# Patient Record
Sex: Female | Born: 1940
Health system: Southern US, Community
[De-identification: ages and names within clinical notes are randomized; demographics above are authoritative.]

## PROBLEM LIST (undated history)

## (undated) DIAGNOSIS — F0390 Unspecified dementia without behavioral disturbance: Secondary | ICD-10-CM

## (undated) DIAGNOSIS — E039 Hypothyroidism, unspecified: Secondary | ICD-10-CM

## (undated) DIAGNOSIS — N183 Chronic kidney disease, stage 3 unspecified: Secondary | ICD-10-CM

## (undated) DIAGNOSIS — M419 Scoliosis, unspecified: Secondary | ICD-10-CM

## (undated) DIAGNOSIS — R931 Abnormal findings on diagnostic imaging of heart and coronary circulation: Secondary | ICD-10-CM

## (undated) DIAGNOSIS — K219 Gastro-esophageal reflux disease without esophagitis: Secondary | ICD-10-CM

## (undated) DIAGNOSIS — F419 Anxiety disorder, unspecified: Secondary | ICD-10-CM

## (undated) DIAGNOSIS — M1990 Unspecified osteoarthritis, unspecified site: Secondary | ICD-10-CM

## (undated) DIAGNOSIS — M549 Dorsalgia, unspecified: Secondary | ICD-10-CM

## (undated) DIAGNOSIS — F329 Major depressive disorder, single episode, unspecified: Secondary | ICD-10-CM

## (undated) DIAGNOSIS — G4730 Sleep apnea, unspecified: Secondary | ICD-10-CM

## (undated) DIAGNOSIS — R112 Nausea with vomiting, unspecified: Secondary | ICD-10-CM

## (undated) DIAGNOSIS — R413 Other amnesia: Secondary | ICD-10-CM

## (undated) DIAGNOSIS — F32A Depression, unspecified: Secondary | ICD-10-CM

## (undated) DIAGNOSIS — I4891 Unspecified atrial fibrillation: Secondary | ICD-10-CM

## (undated) DIAGNOSIS — I499 Cardiac arrhythmia, unspecified: Secondary | ICD-10-CM

## (undated) DIAGNOSIS — M810 Age-related osteoporosis without current pathological fracture: Secondary | ICD-10-CM

## (undated) DIAGNOSIS — R269 Unspecified abnormalities of gait and mobility: Principal | ICD-10-CM

## (undated) DIAGNOSIS — J45909 Unspecified asthma, uncomplicated: Secondary | ICD-10-CM

## (undated) DIAGNOSIS — Z8781 Personal history of (healed) traumatic fracture: Secondary | ICD-10-CM

## (undated) DIAGNOSIS — I10 Essential (primary) hypertension: Secondary | ICD-10-CM

## (undated) HISTORY — DX: Depression, unspecified: F32.A

## (undated) HISTORY — DX: Other amnesia: R41.3

## (undated) HISTORY — DX: Cardiac arrhythmia, unspecified: I49.9

## (undated) HISTORY — DX: Abnormal findings on diagnostic imaging of heart and coronary circulation: R93.1

## (undated) HISTORY — DX: Age-related osteoporosis without current pathological fracture: M81.0

## (undated) HISTORY — PX: JOINT REPLACEMENT: SHX530

## (undated) HISTORY — PX: CATARACT EXTRACTION, BILATERAL: SHX1313

## (undated) HISTORY — DX: Essential (primary) hypertension: I10

## (undated) HISTORY — DX: Major depressive disorder, single episode, unspecified: F32.9

## (undated) HISTORY — DX: Scoliosis, unspecified: M41.9

## (undated) HISTORY — DX: Dorsalgia, unspecified: M54.9

## (undated) HISTORY — PX: DILATION AND CURETTAGE OF UTERUS: SHX78

## (undated) HISTORY — PX: TUBAL LIGATION: SHX77

## (undated) HISTORY — DX: Personal history of (healed) traumatic fracture: Z87.81

## (undated) HISTORY — DX: Unspecified abnormalities of gait and mobility: R26.9

## (undated) HISTORY — DX: Unspecified atrial fibrillation: I48.91

## (undated) HISTORY — DX: Unspecified asthma, uncomplicated: J45.909

---

## 1978-11-18 HISTORY — PX: VAGINAL HYSTERECTOMY: SUR661

## 1998-09-04 ENCOUNTER — Encounter: Admission: RE | Admit: 1998-09-04 | Discharge: 1998-12-03 | Payer: Self-pay | Admitting: Rheumatology

## 1998-09-19 ENCOUNTER — Other Ambulatory Visit: Admission: RE | Admit: 1998-09-19 | Discharge: 1998-09-19 | Payer: Self-pay | Admitting: Family Medicine

## 1998-10-13 ENCOUNTER — Ambulatory Visit (HOSPITAL_COMMUNITY): Admission: RE | Admit: 1998-10-13 | Discharge: 1998-10-13 | Payer: Self-pay | Admitting: Family Medicine

## 1998-10-13 ENCOUNTER — Encounter: Payer: Self-pay | Admitting: Family Medicine

## 1998-11-22 ENCOUNTER — Ambulatory Visit (HOSPITAL_COMMUNITY): Admission: RE | Admit: 1998-11-22 | Discharge: 1998-11-22 | Payer: Self-pay | Admitting: Rheumatology

## 1998-11-22 ENCOUNTER — Encounter: Payer: Self-pay | Admitting: Rheumatology

## 1998-11-27 ENCOUNTER — Ambulatory Visit (HOSPITAL_COMMUNITY): Admission: RE | Admit: 1998-11-27 | Discharge: 1998-11-27 | Payer: Self-pay | Admitting: Family Medicine

## 1999-11-06 ENCOUNTER — Ambulatory Visit (HOSPITAL_COMMUNITY): Admission: RE | Admit: 1999-11-06 | Discharge: 1999-11-06 | Payer: Self-pay | Admitting: Family Medicine

## 1999-11-06 ENCOUNTER — Encounter: Payer: Self-pay | Admitting: Family Medicine

## 2000-06-04 ENCOUNTER — Encounter: Payer: Self-pay | Admitting: Rheumatology

## 2000-06-04 ENCOUNTER — Ambulatory Visit (HOSPITAL_COMMUNITY): Admission: RE | Admit: 2000-06-04 | Discharge: 2000-06-04 | Payer: Self-pay | Admitting: Rheumatology

## 2000-08-13 ENCOUNTER — Other Ambulatory Visit: Admission: RE | Admit: 2000-08-13 | Discharge: 2000-08-13 | Payer: Self-pay | Admitting: Family Medicine

## 2000-11-21 ENCOUNTER — Emergency Department (HOSPITAL_COMMUNITY): Admission: EM | Admit: 2000-11-21 | Discharge: 2000-11-21 | Payer: Self-pay | Admitting: Emergency Medicine

## 2000-11-21 ENCOUNTER — Encounter: Payer: Self-pay | Admitting: Emergency Medicine

## 2001-11-14 ENCOUNTER — Encounter: Payer: Self-pay | Admitting: Emergency Medicine

## 2001-11-14 ENCOUNTER — Emergency Department (HOSPITAL_COMMUNITY): Admission: EM | Admit: 2001-11-14 | Discharge: 2001-11-14 | Payer: Self-pay | Admitting: *Deleted

## 2002-01-20 ENCOUNTER — Ambulatory Visit (HOSPITAL_COMMUNITY): Admission: RE | Admit: 2002-01-20 | Discharge: 2002-01-21 | Payer: Self-pay | Admitting: Nephrology

## 2002-01-20 ENCOUNTER — Encounter (INDEPENDENT_AMBULATORY_CARE_PROVIDER_SITE_OTHER): Payer: Self-pay | Admitting: *Deleted

## 2002-01-20 ENCOUNTER — Encounter: Payer: Self-pay | Admitting: Nephrology

## 2002-02-03 ENCOUNTER — Encounter (INDEPENDENT_AMBULATORY_CARE_PROVIDER_SITE_OTHER): Payer: Self-pay | Admitting: Specialist

## 2002-02-03 ENCOUNTER — Encounter: Payer: Self-pay | Admitting: Nephrology

## 2002-02-03 ENCOUNTER — Ambulatory Visit (HOSPITAL_COMMUNITY): Admission: RE | Admit: 2002-02-03 | Discharge: 2002-02-04 | Payer: Self-pay | Admitting: Nephrology

## 2004-05-23 ENCOUNTER — Emergency Department (HOSPITAL_COMMUNITY): Admission: EM | Admit: 2004-05-23 | Discharge: 2004-05-23 | Payer: Self-pay | Admitting: Emergency Medicine

## 2004-09-18 ENCOUNTER — Encounter (HOSPITAL_COMMUNITY): Admission: RE | Admit: 2004-09-18 | Discharge: 2004-09-27 | Payer: Self-pay | Admitting: Nephrology

## 2004-10-02 ENCOUNTER — Encounter (HOSPITAL_COMMUNITY): Admission: RE | Admit: 2004-10-02 | Discharge: 2004-10-15 | Payer: Self-pay | Admitting: Nephrology

## 2004-10-09 ENCOUNTER — Emergency Department (HOSPITAL_COMMUNITY): Admission: EM | Admit: 2004-10-09 | Discharge: 2004-10-09 | Payer: Self-pay | Admitting: Emergency Medicine

## 2005-09-18 ENCOUNTER — Ambulatory Visit (HOSPITAL_BASED_OUTPATIENT_CLINIC_OR_DEPARTMENT_OTHER): Admission: RE | Admit: 2005-09-18 | Discharge: 2005-09-18 | Payer: Self-pay | Admitting: Family Medicine

## 2005-09-22 ENCOUNTER — Ambulatory Visit: Payer: Self-pay | Admitting: Internal Medicine

## 2005-10-29 ENCOUNTER — Ambulatory Visit (HOSPITAL_BASED_OUTPATIENT_CLINIC_OR_DEPARTMENT_OTHER): Admission: RE | Admit: 2005-10-29 | Discharge: 2005-10-29 | Payer: Self-pay | Admitting: Family Medicine

## 2005-11-03 ENCOUNTER — Ambulatory Visit: Payer: Self-pay | Admitting: Internal Medicine

## 2006-07-02 ENCOUNTER — Encounter: Payer: Self-pay | Admitting: Emergency Medicine

## 2006-07-03 ENCOUNTER — Inpatient Hospital Stay (HOSPITAL_COMMUNITY): Admission: EM | Admit: 2006-07-03 | Discharge: 2006-07-06 | Payer: Self-pay | Admitting: Family Medicine

## 2006-08-25 ENCOUNTER — Encounter (INDEPENDENT_AMBULATORY_CARE_PROVIDER_SITE_OTHER): Payer: Self-pay | Admitting: Specialist

## 2006-08-25 ENCOUNTER — Ambulatory Visit (HOSPITAL_COMMUNITY): Admission: RE | Admit: 2006-08-25 | Discharge: 2006-08-25 | Payer: Self-pay | Admitting: Gastroenterology

## 2006-08-28 ENCOUNTER — Ambulatory Visit (HOSPITAL_COMMUNITY): Admission: RE | Admit: 2006-08-28 | Discharge: 2006-08-28 | Payer: Self-pay | Admitting: Gastroenterology

## 2006-09-02 ENCOUNTER — Ambulatory Visit: Payer: Self-pay | Admitting: Physical Medicine & Rehabilitation

## 2006-09-02 ENCOUNTER — Encounter
Admission: RE | Admit: 2006-09-02 | Discharge: 2006-12-01 | Payer: Self-pay | Admitting: Physical Medicine & Rehabilitation

## 2006-09-16 ENCOUNTER — Encounter
Admission: RE | Admit: 2006-09-16 | Discharge: 2006-09-25 | Payer: Self-pay | Admitting: Physical Medicine & Rehabilitation

## 2006-09-30 ENCOUNTER — Ambulatory Visit: Payer: Self-pay | Admitting: Physical Medicine & Rehabilitation

## 2006-10-07 ENCOUNTER — Emergency Department (HOSPITAL_COMMUNITY): Admission: EM | Admit: 2006-10-07 | Discharge: 2006-10-07 | Payer: Self-pay | Admitting: Emergency Medicine

## 2006-12-21 ENCOUNTER — Emergency Department (HOSPITAL_COMMUNITY): Admission: EM | Admit: 2006-12-21 | Discharge: 2006-12-22 | Payer: Self-pay | Admitting: Emergency Medicine

## 2006-12-23 ENCOUNTER — Ambulatory Visit: Payer: Self-pay | Admitting: Physical Medicine & Rehabilitation

## 2006-12-23 ENCOUNTER — Encounter
Admission: RE | Admit: 2006-12-23 | Discharge: 2007-03-23 | Payer: Self-pay | Admitting: Physical Medicine & Rehabilitation

## 2007-03-20 ENCOUNTER — Ambulatory Visit: Payer: Self-pay | Admitting: Physical Medicine & Rehabilitation

## 2007-03-20 ENCOUNTER — Encounter
Admission: RE | Admit: 2007-03-20 | Discharge: 2007-06-18 | Payer: Self-pay | Admitting: Physical Medicine & Rehabilitation

## 2007-03-23 ENCOUNTER — Ambulatory Visit (HOSPITAL_COMMUNITY)
Admission: RE | Admit: 2007-03-23 | Discharge: 2007-03-23 | Payer: Self-pay | Admitting: Physical Medicine & Rehabilitation

## 2007-03-26 ENCOUNTER — Encounter
Admission: RE | Admit: 2007-03-26 | Discharge: 2007-06-24 | Payer: Self-pay | Admitting: Physical Medicine & Rehabilitation

## 2007-03-30 ENCOUNTER — Ambulatory Visit: Payer: Self-pay | Admitting: Physical Medicine & Rehabilitation

## 2007-05-15 ENCOUNTER — Ambulatory Visit: Payer: Self-pay | Admitting: Physical Medicine & Rehabilitation

## 2007-07-19 ENCOUNTER — Emergency Department (HOSPITAL_COMMUNITY): Admission: EM | Admit: 2007-07-19 | Discharge: 2007-07-19 | Payer: Self-pay | Admitting: Emergency Medicine

## 2007-07-24 ENCOUNTER — Inpatient Hospital Stay (HOSPITAL_COMMUNITY): Admission: EM | Admit: 2007-07-24 | Discharge: 2007-07-30 | Payer: Self-pay | Admitting: Emergency Medicine

## 2007-07-30 ENCOUNTER — Encounter (INDEPENDENT_AMBULATORY_CARE_PROVIDER_SITE_OTHER): Payer: Self-pay | Admitting: Gastroenterology

## 2007-09-02 ENCOUNTER — Ambulatory Visit: Payer: Self-pay | Admitting: Physical Medicine & Rehabilitation

## 2007-09-02 ENCOUNTER — Encounter
Admission: RE | Admit: 2007-09-02 | Discharge: 2007-09-30 | Payer: Self-pay | Admitting: Physical Medicine & Rehabilitation

## 2007-12-22 ENCOUNTER — Encounter
Admission: RE | Admit: 2007-12-22 | Discharge: 2008-03-21 | Payer: Self-pay | Admitting: Physical Medicine & Rehabilitation

## 2008-01-11 ENCOUNTER — Ambulatory Visit: Payer: Self-pay | Admitting: Physical Medicine & Rehabilitation

## 2008-01-11 ENCOUNTER — Ambulatory Visit (HOSPITAL_COMMUNITY)
Admission: RE | Admit: 2008-01-11 | Discharge: 2008-01-11 | Payer: Self-pay | Admitting: Physical Medicine & Rehabilitation

## 2008-02-05 ENCOUNTER — Encounter
Admission: RE | Admit: 2008-02-05 | Discharge: 2008-02-05 | Payer: Self-pay | Admitting: Physical Medicine & Rehabilitation

## 2008-06-22 ENCOUNTER — Encounter
Admission: RE | Admit: 2008-06-22 | Discharge: 2008-06-24 | Payer: Self-pay | Admitting: Physical Medicine & Rehabilitation

## 2008-06-24 ENCOUNTER — Ambulatory Visit: Payer: Self-pay | Admitting: Physical Medicine & Rehabilitation

## 2008-07-21 ENCOUNTER — Emergency Department (HOSPITAL_COMMUNITY): Admission: EM | Admit: 2008-07-21 | Discharge: 2008-07-21 | Payer: Self-pay | Admitting: Emergency Medicine

## 2009-12-19 HISTORY — PX: OPEN REDUCTION INTERNAL FIXATION (ORIF) DISTAL RADIAL FRACTURE: SHX5989

## 2009-12-21 ENCOUNTER — Emergency Department (HOSPITAL_COMMUNITY): Admission: EM | Admit: 2009-12-21 | Discharge: 2009-12-21 | Payer: Self-pay | Admitting: Emergency Medicine

## 2009-12-28 ENCOUNTER — Ambulatory Visit (HOSPITAL_BASED_OUTPATIENT_CLINIC_OR_DEPARTMENT_OTHER): Admission: RE | Admit: 2009-12-28 | Discharge: 2009-12-28 | Payer: Self-pay | Admitting: Orthopedic Surgery

## 2010-02-11 ENCOUNTER — Inpatient Hospital Stay (HOSPITAL_COMMUNITY): Admission: EM | Admit: 2010-02-11 | Discharge: 2010-02-19 | Payer: Self-pay | Admitting: Emergency Medicine

## 2010-02-15 ENCOUNTER — Ambulatory Visit: Payer: Self-pay | Admitting: Vascular Surgery

## 2010-02-15 ENCOUNTER — Encounter (INDEPENDENT_AMBULATORY_CARE_PROVIDER_SITE_OTHER): Payer: Self-pay | Admitting: Surgery

## 2010-03-18 HISTORY — PX: TOTAL KNEE ARTHROPLASTY: SHX125

## 2010-04-06 ENCOUNTER — Inpatient Hospital Stay (HOSPITAL_COMMUNITY): Admission: RE | Admit: 2010-04-06 | Discharge: 2010-04-13 | Payer: Self-pay | Admitting: Orthopedic Surgery

## 2010-04-09 ENCOUNTER — Encounter (INDEPENDENT_AMBULATORY_CARE_PROVIDER_SITE_OTHER): Payer: Self-pay | Admitting: Orthopedic Surgery

## 2010-04-09 ENCOUNTER — Ambulatory Visit: Payer: Self-pay | Admitting: Vascular Surgery

## 2010-09-20 ENCOUNTER — Emergency Department (HOSPITAL_COMMUNITY)
Admission: EM | Admit: 2010-09-20 | Discharge: 2010-09-20 | Payer: Self-pay | Source: Home / Self Care | Admitting: Emergency Medicine

## 2010-12-01 ENCOUNTER — Emergency Department (HOSPITAL_COMMUNITY)
Admission: EM | Admit: 2010-12-01 | Discharge: 2010-12-02 | Disposition: A | Discharge status: Other Acute Inpatient Hospital | Payer: Self-pay | Source: Home / Self Care | Admitting: Emergency Medicine

## 2010-12-03 ENCOUNTER — Emergency Department (HOSPITAL_COMMUNITY)
Admission: EM | Admit: 2010-12-03 | Discharge: 2010-12-03 | Payer: Self-pay | Source: Home / Self Care | Admitting: Emergency Medicine

## 2010-12-03 LAB — CBC
HCT: 41.4 % (ref 36.0–46.0)
Hemoglobin: 14.1 g/dL (ref 12.0–15.0)
MCH: 31.8 pg (ref 26.0–34.0)
MCHC: 34.1 g/dL (ref 30.0–36.0)
MCV: 93.5 fL (ref 78.0–100.0)
Platelets: 198 10*3/uL (ref 150–400)
RBC: 4.43 MIL/uL (ref 3.87–5.11)
RDW: 13 % (ref 11.5–15.5)
WBC: 7.5 10*3/uL (ref 4.0–10.5)

## 2010-12-03 LAB — RAPID URINE DRUG SCREEN, HOSP PERFORMED
Amphetamines: NOT DETECTED
Barbiturates: NOT DETECTED
Benzodiazepines: NOT DETECTED
Cocaine: NOT DETECTED
Opiates: NOT DETECTED
Tetrahydrocannabinol: NOT DETECTED

## 2010-12-03 LAB — COMPREHENSIVE METABOLIC PANEL
ALT: 13 U/L (ref 0–35)
AST: 19 U/L (ref 0–37)
Albumin: 4 g/dL (ref 3.5–5.2)
Alkaline Phosphatase: 91 U/L (ref 39–117)
BUN: 22 mg/dL (ref 6–23)
CO2: 20 mEq/L (ref 19–32)
Calcium: 10 mg/dL (ref 8.4–10.5)
Chloride: 110 mEq/L (ref 96–112)
Creatinine, Ser: 2.07 mg/dL — ABNORMAL HIGH (ref 0.4–1.2)
GFR calc Af Amer: 29 mL/min — ABNORMAL LOW (ref >60)
GFR calc non Af Amer: 24 mL/min — ABNORMAL LOW (ref >60)
Glucose, Bld: 132 mg/dL — ABNORMAL HIGH (ref 70–99)
Potassium: 4.2 mEq/L (ref 3.5–5.1)
Sodium: 140 mEq/L (ref 135–145)
Total Bilirubin: 0.6 mg/dL (ref 0.3–1.2)
Total Protein: 7.7 g/dL (ref 6.0–8.3)

## 2010-12-03 LAB — URINALYSIS, ROUTINE W REFLEX MICROSCOPIC
Bilirubin Urine: NEGATIVE
Hgb urine dipstick: NEGATIVE
Ketones, ur: NEGATIVE mg/dL
Nitrite: NEGATIVE
Protein, ur: NEGATIVE mg/dL
Specific Gravity, Urine: 1.02 (ref 1.005–1.030)
Urine Glucose, Fasting: NEGATIVE mg/dL
Urobilinogen, UA: 0.2 mg/dL (ref 0.0–1.0)
pH: 5 (ref 5.0–8.0)

## 2010-12-03 LAB — DIFFERENTIAL
Basophils Absolute: 0 10*3/uL (ref 0.0–0.1)
Basophils Relative: 0 % (ref 0–1)
Eosinophils Absolute: 0 10*3/uL (ref 0.0–0.7)
Eosinophils Relative: 0 % (ref 0–5)
Lymphocytes Relative: 18 % (ref 12–46)
Lymphs Abs: 1.3 10*3/uL (ref 0.7–4.0)
Monocytes Absolute: 0.5 10*3/uL (ref 0.1–1.0)
Monocytes Relative: 6 % (ref 3–12)
Neutro Abs: 5.7 10*3/uL (ref 1.7–7.7)
Neutrophils Relative %: 75 % (ref 43–77)

## 2010-12-03 LAB — ETHANOL: Alcohol, Ethyl (B): <5 mg/dL (ref 0–10)

## 2010-12-08 ENCOUNTER — Other Ambulatory Visit: Payer: Self-pay | Admitting: Otolaryngology

## 2010-12-08 DIAGNOSIS — R42 Dizziness and giddiness: Secondary | ICD-10-CM

## 2010-12-19 ENCOUNTER — Ambulatory Visit
Admission: RE | Admit: 2010-12-19 | Discharge: 2010-12-19 | Disposition: A | Discharge status: Home / Self Care | Payer: MEDICARE | Source: Ambulatory Visit | Attending: Otolaryngology | Admitting: Otolaryngology

## 2010-12-19 ENCOUNTER — Other Ambulatory Visit: Payer: Self-pay | Admitting: Otolaryngology

## 2010-12-19 DIAGNOSIS — R42 Dizziness and giddiness: Secondary | ICD-10-CM

## 2011-02-04 LAB — RENAL FUNCTION PANEL
Albumin: 3.3 g/dL — ABNORMAL LOW (ref 3.5–5.2)
Albumin: 3.3 g/dL — ABNORMAL LOW (ref 3.5–5.2)
Albumin: 3.4 g/dL — ABNORMAL LOW (ref 3.5–5.2)
BUN: 21 mg/dL (ref 6–23)
BUN: 25 mg/dL — ABNORMAL HIGH (ref 6–23)
BUN: 29 mg/dL — ABNORMAL HIGH (ref 6–23)
CO2: 20 mEq/L (ref 19–32)
CO2: 23 mEq/L (ref 19–32)
Calcium: 8.9 mg/dL (ref 8.4–10.5)
Calcium: 9 mg/dL (ref 8.4–10.5)
Chloride: 108 mEq/L (ref 96–112)
Chloride: 115 mEq/L — ABNORMAL HIGH (ref 96–112)
Creatinine, Ser: 1.57 mg/dL — ABNORMAL HIGH (ref 0.4–1.2)
Creatinine, Ser: 1.97 mg/dL — ABNORMAL HIGH (ref 0.4–1.2)
Creatinine, Ser: 2.09 mg/dL — ABNORMAL HIGH (ref 0.4–1.2)
Creatinine, Ser: 2.12 mg/dL — ABNORMAL HIGH (ref 0.4–1.2)
GFR calc Af Amer: 28 mL/min — ABNORMAL LOW (ref >60)
GFR calc Af Amer: 29 mL/min — ABNORMAL LOW (ref >60)
GFR calc non Af Amer: 23 mL/min — ABNORMAL LOW (ref >60)
GFR calc non Af Amer: 24 mL/min — ABNORMAL LOW (ref >60)
GFR calc non Af Amer: 33 mL/min — ABNORMAL LOW (ref >60)
Glucose, Bld: 122 mg/dL — ABNORMAL HIGH (ref 70–99)
Phosphorus: 3.4 mg/dL (ref 2.3–4.6)
Phosphorus: 4 mg/dL (ref 2.3–4.6)
Sodium: 140 mEq/L (ref 135–145)
Sodium: 141 mEq/L (ref 135–145)

## 2011-02-04 LAB — BASIC METABOLIC PANEL
BUN: 15 mg/dL (ref 6–23)
BUN: 15 mg/dL (ref 6–23)
BUN: 15 mg/dL (ref 6–23)
BUN: 19 mg/dL (ref 6–23)
CO2: 27 mEq/L (ref 19–32)
Calcium: 8.1 mg/dL — ABNORMAL LOW (ref 8.4–10.5)
Calcium: 8.3 mg/dL — ABNORMAL LOW (ref 8.4–10.5)
Calcium: 8.4 mg/dL (ref 8.4–10.5)
Calcium: 9 mg/dL (ref 8.4–10.5)
Chloride: 104 mEq/L (ref 96–112)
Creatinine, Ser: 1.06 mg/dL (ref 0.4–1.2)
Creatinine, Ser: 1.18 mg/dL (ref 0.4–1.2)
Creatinine, Ser: 1.21 mg/dL — ABNORMAL HIGH (ref 0.4–1.2)
Creatinine, Ser: 1.21 mg/dL — ABNORMAL HIGH (ref 0.4–1.2)
Creatinine, Ser: 1.25 mg/dL — ABNORMAL HIGH (ref 0.4–1.2)
GFR calc Af Amer: 38 mL/min — ABNORMAL LOW (ref >60)
GFR calc Af Amer: 55 mL/min — ABNORMAL LOW (ref >60)
GFR calc Af Amer: >60 mL/min (ref >60)
GFR calc non Af Amer: 32 mL/min — ABNORMAL LOW (ref >60)
GFR calc non Af Amer: 43 mL/min — ABNORMAL LOW (ref >60)
GFR calc non Af Amer: 45 mL/min — ABNORMAL LOW (ref >60)
GFR calc non Af Amer: 45 mL/min — ABNORMAL LOW (ref >60)
GFR calc non Af Amer: 48 mL/min — ABNORMAL LOW (ref >60)
GFR calc non Af Amer: 52 mL/min — ABNORMAL LOW (ref >60)
Glucose, Bld: 107 mg/dL — ABNORMAL HIGH (ref 70–99)
Glucose, Bld: 123 mg/dL — ABNORMAL HIGH (ref 70–99)
Potassium: 3.2 mEq/L — ABNORMAL LOW (ref 3.5–5.1)
Potassium: 3.3 mEq/L — ABNORMAL LOW (ref 3.5–5.1)
Potassium: 3.4 mEq/L — ABNORMAL LOW (ref 3.5–5.1)
Sodium: 139 mEq/L (ref 135–145)
Sodium: 141 mEq/L (ref 135–145)
Sodium: 143 mEq/L (ref 135–145)

## 2011-02-04 LAB — CBC
HCT: 24 % — ABNORMAL LOW (ref 36.0–46.0)
Hemoglobin: 10.9 g/dL — ABNORMAL LOW (ref 12.0–15.0)
Hemoglobin: 8.3 g/dL — ABNORMAL LOW (ref 12.0–15.0)
Hemoglobin: 9.1 g/dL — ABNORMAL LOW (ref 12.0–15.0)
Hemoglobin: 9.9 g/dL — ABNORMAL LOW (ref 12.0–15.0)
MCHC: 34 g/dL (ref 30.0–36.0)
MCHC: 34.3 g/dL (ref 30.0–36.0)
MCHC: 34.5 g/dL (ref 30.0–36.0)
MCHC: 34.7 g/dL (ref 30.0–36.0)
MCHC: 34.8 g/dL (ref 30.0–36.0)
MCV: 91.1 fL (ref 78.0–100.0)
MCV: 91.2 fL (ref 78.0–100.0)
MCV: 91.2 fL (ref 78.0–100.0)
MCV: 92 fL (ref 78.0–100.0)
MCV: 92 fL (ref 78.0–100.0)
Platelets: 139 10*3/uL — ABNORMAL LOW (ref 150–400)
Platelets: 156 10*3/uL (ref 150–400)
Platelets: 179 10*3/uL (ref 150–400)
Platelets: 195 10*3/uL (ref 150–400)
Platelets: 219 10*3/uL (ref 150–400)
RBC: 2.61 MIL/uL — ABNORMAL LOW (ref 3.87–5.11)
RBC: 3.09 MIL/uL — ABNORMAL LOW (ref 3.87–5.11)
RBC: 3.17 MIL/uL — ABNORMAL LOW (ref 3.87–5.11)
RBC: 3.32 MIL/uL — ABNORMAL LOW (ref 3.87–5.11)
RBC: 3.44 MIL/uL — ABNORMAL LOW (ref 3.87–5.11)
RBC: 3.55 MIL/uL — ABNORMAL LOW (ref 3.87–5.11)
RBC: 4.15 MIL/uL (ref 3.87–5.11)
RDW: 13.4 % (ref 11.5–15.5)
RDW: 13.7 % (ref 11.5–15.5)
RDW: 13.8 % (ref 11.5–15.5)
RDW: 14.1 % (ref 11.5–15.5)
WBC: 11.4 10*3/uL — ABNORMAL HIGH (ref 4.0–10.5)
WBC: 13.5 10*3/uL — ABNORMAL HIGH (ref 4.0–10.5)
WBC: 8.1 10*3/uL (ref 4.0–10.5)
WBC: 8.5 10*3/uL (ref 4.0–10.5)

## 2011-02-04 LAB — URINE CULTURE: Culture: NO GROWTH

## 2011-02-04 LAB — TYPE AND SCREEN
ABO/RH(D): O NEG
Antibody Screen: NEGATIVE

## 2011-02-04 LAB — URINALYSIS, ROUTINE W REFLEX MICROSCOPIC
Glucose, UA: NEGATIVE mg/dL
Hgb urine dipstick: NEGATIVE
Specific Gravity, Urine: 1.012 (ref 1.005–1.030)

## 2011-02-04 LAB — COMPREHENSIVE METABOLIC PANEL
ALT: 17 U/L (ref 0–35)
AST: 27 U/L (ref 0–37)
Alkaline Phosphatase: 80 U/L (ref 39–117)
CO2: 23 mEq/L (ref 19–32)
Chloride: 107 mEq/L (ref 96–112)
Creatinine, Ser: 2.19 mg/dL — ABNORMAL HIGH (ref 0.4–1.2)
GFR calc Af Amer: 27 mL/min — ABNORMAL LOW (ref >60)
GFR calc non Af Amer: 22 mL/min — ABNORMAL LOW (ref >60)
Total Bilirubin: 0.7 mg/dL (ref 0.3–1.2)

## 2011-02-04 LAB — PROTIME-INR: Prothrombin Time: 14.1 seconds (ref 11.6–15.2)

## 2011-02-04 LAB — TSH: TSH: 0.788 u[IU]/mL (ref 0.350–4.500)

## 2011-02-04 LAB — CARDIAC PANEL(CRET KIN+CKTOT+MB+TROPI)
CK, MB: 2.2 ng/mL (ref 0.3–4.0)
CK, MB: 6.1 ng/mL (ref 0.3–4.0)
Relative Index: 0.9 (ref 0.0–2.5)
Relative Index: 1 (ref 0.0–2.5)

## 2011-02-04 LAB — DIFFERENTIAL
Basophils Absolute: 0 10*3/uL (ref 0.0–0.1)
Basophils Relative: 0 % (ref 0–1)
Eosinophils Absolute: 0.1 10*3/uL (ref 0.0–0.7)
Eosinophils Relative: 1 % (ref 0–5)
Lymphocytes Relative: 24 % (ref 12–46)

## 2011-02-04 LAB — MAGNESIUM
Magnesium: 1.8 mg/dL (ref 1.5–2.5)
Magnesium: 1.9 mg/dL (ref 1.5–2.5)
Magnesium: 1.9 mg/dL (ref 1.5–2.5)

## 2011-02-06 LAB — COMPREHENSIVE METABOLIC PANEL
ALT: 30 U/L (ref 0–35)
Alkaline Phosphatase: 61 U/L (ref 39–117)
BUN: 13 mg/dL (ref 6–23)
CO2: 29 mEq/L (ref 19–32)
Chloride: 105 mEq/L (ref 96–112)
GFR calc non Af Amer: 32 mL/min — ABNORMAL LOW (ref >60)
Glucose, Bld: 92 mg/dL (ref 70–99)
Potassium: 3.2 mEq/L — ABNORMAL LOW (ref 3.5–5.1)
Sodium: 142 mEq/L (ref 135–145)
Total Bilirubin: 0.6 mg/dL (ref 0.3–1.2)

## 2011-02-06 LAB — CBC
HCT: 37 % (ref 36.0–46.0)
Hemoglobin: 12.5 g/dL (ref 12.0–15.0)
MCV: 95.7 fL (ref 78.0–100.0)
Platelets: 145 10*3/uL — ABNORMAL LOW (ref 150–400)
RBC: 3.87 MIL/uL (ref 3.87–5.11)
WBC: 5.4 10*3/uL (ref 4.0–10.5)

## 2011-02-08 LAB — POCT I-STAT, CHEM 8
Creatinine, Ser: 2.3 mg/dL — ABNORMAL HIGH (ref 0.4–1.2)
HCT: 30 % — ABNORMAL LOW (ref 36.0–46.0)
Hemoglobin: 10.2 g/dL — ABNORMAL LOW (ref 12.0–15.0)
Potassium: 4.9 mEq/L (ref 3.5–5.1)
Sodium: 140 mEq/L (ref 135–145)
TCO2: 19 mmol/L (ref 0–100)

## 2011-02-11 LAB — RENAL FUNCTION PANEL
Albumin: 2.8 g/dL — ABNORMAL LOW (ref 3.5–5.2)
BUN: 22 mg/dL (ref 6–23)
Calcium: 8.6 mg/dL (ref 8.4–10.5)
Calcium: 8.7 mg/dL (ref 8.4–10.5)
Creatinine, Ser: 1.53 mg/dL — ABNORMAL HIGH (ref 0.4–1.2)
GFR calc Af Amer: 41 mL/min — ABNORMAL LOW (ref >60)
Glucose, Bld: 95 mg/dL (ref 70–99)
Phosphorus: 2.3 mg/dL (ref 2.3–4.6)
Phosphorus: 2.6 mg/dL (ref 2.3–4.6)
Sodium: 141 mEq/L (ref 135–145)

## 2011-02-11 LAB — HEPATIC FUNCTION PANEL
ALT: 14 U/L (ref 0–35)
Albumin: 4.2 g/dL (ref 3.5–5.2)
Alkaline Phosphatase: 83 U/L (ref 39–117)
Bilirubin, Direct: 0.1 mg/dL (ref 0.0–0.3)
Indirect Bilirubin: 1.2 mg/dL — ABNORMAL HIGH (ref 0.3–0.9)
Total Bilirubin: 1.3 mg/dL — ABNORMAL HIGH (ref 0.3–1.2)
Total Protein: 9 g/dL — ABNORMAL HIGH (ref 6.0–8.3)

## 2011-02-11 LAB — POCT I-STAT, CHEM 8
Calcium, Ion: 0.97 mmol/L — ABNORMAL LOW (ref 1.12–1.32)
Creatinine, Ser: 5 mg/dL — ABNORMAL HIGH (ref 0.4–1.2)
HCT: 48 % — ABNORMAL HIGH (ref 36.0–46.0)
Hemoglobin: 16.3 g/dL — ABNORMAL HIGH (ref 12.0–15.0)
Potassium: 5.2 mEq/L — ABNORMAL HIGH (ref 3.5–5.1)
Sodium: 138 mEq/L (ref 135–145)
TCO2: 21 mmol/L (ref 0–100)

## 2011-02-11 LAB — CBC
HCT: 29.9 % — ABNORMAL LOW (ref 36.0–46.0)
HCT: 35.2 % — ABNORMAL LOW (ref 36.0–46.0)
Hemoglobin: 11.3 g/dL — ABNORMAL LOW (ref 12.0–15.0)
Hemoglobin: 15.1 g/dL — ABNORMAL HIGH (ref 12.0–15.0)
MCHC: 32.9 g/dL (ref 30.0–36.0)
MCHC: 33.8 g/dL (ref 30.0–36.0)
MCHC: 33.8 g/dL (ref 30.0–36.0)
MCHC: 34 g/dL (ref 30.0–36.0)
MCV: 95.7 fL (ref 78.0–100.0)
MCV: 96.3 fL (ref 78.0–100.0)
MCV: 96.8 fL (ref 78.0–100.0)
Platelets: 100 10*3/uL — ABNORMAL LOW (ref 150–400)
RBC: 3.57 MIL/uL — ABNORMAL LOW (ref 3.87–5.11)
RBC: 3.65 MIL/uL — ABNORMAL LOW (ref 3.87–5.11)
RBC: 4.61 MIL/uL (ref 3.87–5.11)
RDW: 13.9 % (ref 11.5–15.5)
RDW: 13.9 % (ref 11.5–15.5)
RDW: 14.2 % (ref 11.5–15.5)
WBC: 6.9 10*3/uL (ref 4.0–10.5)

## 2011-02-11 LAB — URINALYSIS, ROUTINE W REFLEX MICROSCOPIC
Glucose, UA: NEGATIVE mg/dL
Ketones, ur: 15 mg/dL — AB
Nitrite: NEGATIVE
Protein, ur: NEGATIVE mg/dL
Urobilinogen, UA: 1 mg/dL (ref 0.0–1.0)

## 2011-02-11 LAB — COMPREHENSIVE METABOLIC PANEL
AST: 12 U/L (ref 0–37)
CO2: 26 mEq/L (ref 19–32)
Calcium: 8.1 mg/dL — ABNORMAL LOW (ref 8.4–10.5)
Creatinine, Ser: 4.56 mg/dL — ABNORMAL HIGH (ref 0.4–1.2)
GFR calc Af Amer: 12 mL/min — ABNORMAL LOW (ref >60)
GFR calc non Af Amer: 10 mL/min — ABNORMAL LOW (ref >60)
Total Protein: 6.3 g/dL (ref 6.0–8.3)

## 2011-02-11 LAB — DIFFERENTIAL
Basophils Absolute: 0.1 10*3/uL (ref 0.0–0.1)
Basophils Relative: 1 % (ref 0–1)
Lymphocytes Relative: 10 % — ABNORMAL LOW (ref 12–46)
Monocytes Absolute: 0.8 10*3/uL (ref 0.1–1.0)
Monocytes Relative: 6 % (ref 3–12)
Neutro Abs: 11.5 10*3/uL — ABNORMAL HIGH (ref 1.7–7.7)
Neutrophils Relative %: 84 % — ABNORMAL HIGH (ref 43–77)

## 2011-02-11 LAB — BASIC METABOLIC PANEL
BUN: 39 mg/dL — ABNORMAL HIGH (ref 6–23)
CO2: 31 mEq/L (ref 19–32)
Chloride: 108 mEq/L (ref 96–112)
GFR calc Af Amer: 26 mL/min — ABNORMAL LOW (ref >60)
Potassium: 4.3 mEq/L (ref 3.5–5.1)

## 2011-02-11 LAB — POCT CARDIAC MARKERS: Myoglobin, poc: 249 ng/mL (ref 12–200)

## 2011-02-11 LAB — LIPASE, BLOOD: Lipase: 51 U/L (ref 11–59)

## 2011-02-26 DIAGNOSIS — R931 Abnormal findings on diagnostic imaging of heart and coronary circulation: Secondary | ICD-10-CM

## 2011-02-26 HISTORY — DX: Abnormal findings on diagnostic imaging of heart and coronary circulation: R93.1

## 2011-04-02 NOTE — Assessment & Plan Note (Signed)
[name] is back regarding her back and knee pain.  She had good results for  several months from the last injection into her left knee, although she  has had some more pain over the last 1 to 2.  Her knee x-ray showed  bilateral medial compartment arthritis, although mild-to-moderate in  nature.  Back and hips are painful at times particularly as she gets  moving throughout the day.  Her back pain seem to tighten up near the  left shoulder blade.  She rates her pain as 9/10 when it is peak and  describes it as a sharp, intermittent, stabbing, and aching.  Sleep can  be poor at times.  She likes Lidoderm patches, but cannot afford them as  her insurance will not cover them.   REVIEW OF SYSTEMS:  Notable for trouble walking, spasms, nausea,  vomiting, and decreased appetite.  Other pertinent positives are above.  Full review is in the written health and history section of the chart.   SOCIAL HISTORY:  The patient is single and lives alone.   PHYSICAL EXAMINATION:  VITALS:  Blood pressure 122/67, pulse is 76,  respiratory rate 22, and she is sating 98% on room air.  GENERAL:  The patient is pleasant, alert, and oriented x3.  Affect is  generally appropriate.  She walks with an antalgic to the left side.  She has some mild crepitus along the left knee with extension, flexion,  and pain with meniscal movement particularly to the medial side.  She  has 5/5 strength except for pain in addition at the left hip and knee.  Sensory exam is intact.  Pelvic posture is fair but she continues to  have the significant level of scoliosis in the thoracolumbar spine.  She  has tenderness in the mid thoracic paraspinal muscles on the left.  Cognitively, she is intact.  Cranial nerve exam is normal.  HEART:  Regular rate and rhythm.  LUNGS:  Clear.  ABDOMEN:  Soft and nontender.   ASSESSMENT:  1. Chronic low back and thoracic back pain with scoliosis, lumbar      facet disease, and myofascial  dysfunction.  2. Osteoarthritis of the knees, left greater than right.  3. Left femoral neck fracture.   PLAN:  1. After informed consent, we injected the left knee via medial      approach using 60 mg of methylprednisolone and 3 mL of 1%      lidocaine.  The patient tolerated well.  2. We will try long-acting Ultram ER 100 mg daily, to see if this      improves her quality of life and we do not have a lot of details to      offer her from a pain standpoint considering the chronicity of her      scoliosis and arthritis in the back.  3. I recommended over-the-counter supplementation.  She should try      heat, stretching, and regular exercise as well.  4. I will see her back in about 3 months.  She can use her Vicodin for      breakthrough pain.      Ranelle Oyster, M.D.  Electronically Signed    ZTS/MedQ  D:  06/24/2008 14:13:26  T:  06/25/2008 06:35:13  Job #:  [id number]   cc:   Renaye Rakers, M.D.  Fax: [phone]

## 2011-04-02 NOTE — Assessment & Plan Note (Signed)
REASON FOR PRESENTATION:  [name] is back regarding her chronic pain.  She  has had a bit worsening of her back pain after a fall 1 or 2 weeks ago  when she tripped over her dog.  She had pain in the left shoulder and  neck.  The pain has been easing off a bit, but still present.  She has  some pain as well in the low back and knees, which is common for her.  She lost her Norco script that we gave her last time and was unable to  try this out.  The knee injections we performed in July helped a bit.  Medically speaking, she had 2 hospitalizations, 1 in July and then again  in August for GI problems; apparently, she had diverticulitis and then  reflux disease diagnosed in September.  Sleep is poor due to her pain.  She is tight in the evenings.  She tries to walk a bit; she can walk  about 10 minutes at a time.  She likes to goes to shows and events with  her daughter.   REVIEW OF SYSTEMS:  Notable for the above.  Her mood has been stable.  She is home alone currently.   PHYSICAL EXAMINATION:  VITAL SIGNS:  Blood pressure is 114/64,pulse is  91, respiratory rate 18 and she is saturating 99% on room air.  GENERAL:  The patient is pleasant, alert and oriented x3.  Affect is  generally bright and appropriate.  NEUROMUSCULAR:  She walks with an antalgic gait.  She has had difficulty  moving from a sitting to a standing position.  Low back was tender to  palpation and was limited with flexion and extension, rotation and  lateral bending today.  She had crepitus with all movements.  Right  trapezius and cervical musculature were tender with palpation and  stretching today.  She is limited with flexion and extension there.  Strength is generally 5/5.  Sensory exam is grossly intact.  Both knees  are tender with active and passive movement.  Cognitively she is  appropriate.   ASSESSMENT:  1. Chronic low back pain related to scoliosis and facet disease.  2. Osteoarthritis of the bilateral knees.  3. Old left femoral neck fracture.  4. Questionable sacroiliac joint dysfunction.  5. Right-sided cervical myofascial pain after a fall.   PLAN:  1. At this point, we do not have a lot of options as far as treatment      is concerned.  Her pain is general in nature and the less she does,      the more her pain seems to accelerate.  We will try again with the      Norco 5/325 half to one q.8 h. p.r.n.  She will try to find some      exercising that she can tolerate.  I encouraged water therapy.  2. Observe bowel and bladder habits closely while on the Norco.  3. Continue Flexeril and Lidoderm patches.  4. I will see her back in about 3 months' time.      Ranelle Oyster, M.D.  Electronically Signed     ZTS/MedQ  D:  09/02/2007 11:32:03  T:  09/03/2007 09:09:00  Job #:  [id number]   cc:   Renaye Rakers, M.D.  Fax: [phone]

## 2011-04-02 NOTE — Assessment & Plan Note (Signed)
[name] comes back regarding her back and leg pain.  She is having some  back pain but her biggest symptoms are in her knees, left more than  right.  She could not tolerate the Fentanyl patch.  She has been out of  the hydrocodone for a while.  The pain has been really worse over the  last month or so.  She rates it at an 8-9 out of 10.  The pain is sharp,  stabbing, aching and constant.  It bothers her more when she walks and  stands.  She has problems going up steps and down steps as well as  getting up from a chair as well.  The pain is better with rest somewhat.  She does like to try and stay active.  She notes she has been on  Flexeril and Lidoderm patches with some relief.   REVIEW OF SYSTEMS:  Notable for the above as well as occasional spasm  and anxiety, shortness of breath, sleep apnea, wheezing.  Other  pertinent positives listed above.  A full review is in the written  health and history section of the chart.   SOCIAL HISTORY:  The patient is single and divorced.   PHYSICAL EXAMINATION:  VITAL SIGNS:  Blood pressure is 145/78, pulse 79,  respiratory rate 20.  She is sating 99% on room air.  GENERAL:  The patient is pleasant, alert and oriented x3.  MUSCULOSKELETAL:  She has pain over the medial inferior portion of the  patella and has pain with meniscal signs on the medial aspect of the  knee.  Mild crepitus is appreciated.  Pain seems to inhibit muscle  strength a bit but otherwise she is near 4 plus to 5 over 5.  Sensory  exam is stable.  Pelvic posture is fair, though she does tend to favor  the right side a bit.  Levoscoliosis of the thoracolumbar spine.  Trapezius and cervical musculature were somewhat tender today.  Cognitively she is intact.  HEART:  Regular.  CHEST:  Clear.  ABDOMEN:  Soft, nontender.   ASSESSMENT:  1. Chronic low back pain related to scoliosis and lumbar facet      disease.  2. Osteoarthritis of the bilateral knees, left greater than right.  3.  Left femoral neck fracture.  4. History of right sided cervical myofascial pain.   PLAN:  After informed consent, we injected the left knee via the medial  approach focusing on the medial compartment and the medial patella.  Injected 40 mg of Kenalog and 3 mL of 1% lidocaine.  The patient  tolerated well.  We will check x-rays of the knees to assess for osteoarthritis.  She may  be at the point where she needs cervical intervention there.  Consider Synvisc and over-the-counter supplementations.  We will add Norco 7.5 for breakthrough pain one q.6 h. p.r.n.  Consider Synvisc injections in the future.      Ranelle Oyster, M.D.  Electronically Signed     ZTS/MedQ  D:  01/11/2008 16:08:12  T:  01/11/2008 22:59:42  Job #:  [id number]   cc:   Renaye Rakers, M.D.  Fax: [phone]

## 2011-04-02 NOTE — H&P (Signed)
Ana, Foster NO.:  [id number]   MEDICAL RECORD NO.:  [id number]          PATIENT TYPE:  INP   LOCATION:  0102                         FACILITY:  [facility]   PHYSICIAN:  [name], M.D.       DATE OF BIRTH:  1941-02-08   DATE OF ADMISSION:  07/23/2007  DATE OF DISCHARGE:                              HISTORY & PHYSICAL   PRIMARY CARE PHYSICIAN:  [name], M.D.   CHIEF COMPLAINT:  Abdominal pain.   HISTORY OF PRESENT ILLNESS:  [name] is a 70 year old woman with a  history of depression, anxiety and chronic pain who presents to [facility] Emergency Room after a prolonged illness.  About a week prior to  admission, the patient started experiencing severe, left lower quadrant  abdominal pain.  She presented to [facility] Emergency Room where she  had a full evaluation including a computed tomography scan of the  abdomen and pelvis.  The patient was found to have significant  constipation and also some chronic thickening of the sigmoid colon.  The  patient was sent home.  She continued to experience significant  abdominal pain.  She went to see her primary care physician today and  was referred back to the [facility] Room for consideration of  admission.  In the emergency room here, the patient reports that she has  been having liquid stools and that she has vomited once.  She says that  her abdominal pain is currently diffuse and severe.  The patient is very  dramatic and associates every word in a very dramatic fashion.   PAST MEDICAL HISTORY:  1. Chronic kidney disease with a baseline creatinine of 1.5.  2. Obstructive sleep apnea.  3. Gastroesophageal reflux disease.  4. Hypertension.  5. Colonic polyps.  6. Hypothyroidism.  7. Depression.  8. Osteoporosis.  9. Osteoarthritis.  10.Kyphoscoliosis.  11.Anemia.  12.Irregular heart beat.   HOME MEDICATIONS:  1. Verapamil 300 mg daily.  2. Prevacid 30 mg daily.  3. Synthroid 100  mcg daily.  4. Xanax 0.5 mg three times a day.  5. Clonidine 0.2 mg three times a day.  6. Zoloft 150 mg daily.  7. Multivitamin daily.  8. MiraLax as needed.  9. Lidoderm 5% patch.   SOCIAL HISTORY:  The patient is currently on Disability.  She is  divorced.  She does not smoke cigarettes.  Does not drink alcohol.   FAMILY HISTORY:  The patient's parents are deceased with lung cancer.  All the patient's brothers are deceased.   REVIEW OF SYSTEMS:  Positive for chest pains, coughing, bone pain, back  pain, headaches, weakness.   PHYSICAL EXAMINATION:  VITAL SIGNS:  Temperature 99.7, Foster pressure  137/79, pulse 89, respirations 18, saturations 97% on room air.  GENERAL:  An older than stated age woman, frail, lying on stretcher.  She is alert, oriented to person, place and time and very anxious.  HEENT:  Normocephalic, atraumatic.  Pupils equal round and reactive to  light and accommodation.  Extraocular movements intact.  Throat clear.  NECK:  Supple with  no JVD.  CHEST:  Clear to auscultation without wheezes or rhonchi heard.  CARDIAC:  Regular rate and rhythm without murmurs, rubs or gallops.  ABDOMEN:  Soft, diffuse tenderness.  No rebound or guarding.  Bowel  sounds are decreased.  EXTREMITIES:  Lower extremities without edema.   LABORATORY DATA AND X-RAY FINDINGS:  White Foster cell count 9.2,  hemoglobin 12.4, platelet count 321.  Sodium 140, potassium 4, chloride  107, bicarb 25, BUN 20, creatinine 1.8, calcium 9.  Lipase 27.   Abdominal x-ray shows severe constipation.   ASSESSMENT/PLAN:  Abdominal pain.  I wonder if it is secondary to the  patient's severe constipation.  I doubt that the patient has life  threatening intra-abdominal pathology, i.e. colitis, appendicitis,  pancreatitis or cholecystitis.  For now, I will place the patient on  intravenous fluids, clear liquid diet and try to treat aggressively the  constipation.  I will also try to wean the patient off  of clonidine and  Verapamil which definitely is worsening the constipation.      [name], M.D.  Electronically Signed     SL/MEDQ  D:  07/24/2007  T:  07/24/2007  Job:  [id number]   cc:   [name], M.D.  Fax: [phone]

## 2011-04-02 NOTE — Discharge Summary (Signed)
[name], [name] NO.:  [id number]   MEDICAL RECORD NO.:  [id number]          PATIENT TYPE:  INP   LOCATION:  1338                         FACILITY:  [facility]   PHYSICIAN:  Herbie Saxon, MDDATE OF BIRTH:  07-17-1941   DATE OF ADMISSION:  07/23/2007  DATE OF DISCHARGE:                               DISCHARGE SUMMARY   ADDENDUM:   PROCEDURE:  Colonoscopy done on July 30, 2007 shows colon polyps.  This was done by Dr. Elnoria Howard.  His recommendation was to follow up the  biopsy taken, repeat colonoscopy in five years, high-fiber diet, and she  has been cleared for discharge from a GI point of view.   PHYSICAL EXAMINATION:  On examination today, temperature 99, pulse 63,  respiratory rate 16, blood pressure 133/64.  Clinically, not jaundiced.  Heart sounds 1 and 2 regular.  CHEST:  Clinically clear.  ABDOMEN:  Benign.  No tenderness.  Bowel sounds are normoactive.  She is alert and oriented x3.  Peripheral pulses present.  No peripheral edema.   PRIMARY DISCHARGE DIAGNOSES:  Colon polyps.   MEDICATIONS ON DISCHARGE:  1. Xanax 0.25 mg q.12h.  2. Clonidine 0.1 mg t.i.d.  3. Synthroid 112 mcg daily.  4. Multivitamin 1 tablet daily.  5. Prilosec 20 mg daily.  6. MiraLax 17 mg daily.  7. Zoloft 150 mg daily.  8. Tylenol 650 mg q.6h. p.r.n.  9. Reglan 10 mg p.o. q.8h. p.r.n.   AVAILABLE LABS:  The WBC is 8, hematocrit 28, platelet count 143.  Chemistry shows a sodium of 144, potassium 2.8, chloride 114,  bicarbonate 26, glucose 116, BUN 1, creatinine 1.5.   This took less than 30 minutes.     Herbie Saxon, MD  Electronically Signed    MIO/MEDQ  D:  07/30/2007  T:  07/30/2007  Job:  [id number]

## 2011-04-02 NOTE — Discharge Summary (Signed)
[name], [name]               ACCOUNT NO.:  [id number]   MEDICAL RECORD NO.:  [id number]          PATIENT TYPE:  INP   LOCATION:  1338                         FACILITY:  [facility]   PHYSICIAN:  Hind Bosie Helper, MD      DATE OF BIRTH:  02/03/1941   DATE OF ADMISSION:  07/23/2007  DATE OF DISCHARGE:                               DISCHARGE SUMMARY   INTERIM DISCHARGE SUMMARY:   PRIMARY CARE PHYSICIAN:  Renaye Rakers, M.D.   DISCHARGE DIAGNOSES:  1. Abdominal pain secondary to severe constipation.  2. Thickened colon wall, rule out colitis.  3. Anemia rule out gastrointestinal bleeding.  The patient is      scheduled for EGD and colonoscopy on July 29, 2007.  4. Chronic renal insufficiency with creatinine around 1.78.  5. Gastroesophageal reflux disease.  6. Hypertension.  7. Obstructive sleep apnea.  8. Depression.  9. Osteoporosis with scoliosis.  10.Osteoarthritis.  11.Hypothyroidism.   MEDICATIONS:  To be addressed on the date of discharge.   CONSULTATIONS:  Gastroenterology consulted, done by Dr. Loreta Ave for the  continuity of abdominal pain.   HISTORY OF PRESENT ILLNESS:  Please review the history done by Dr. Lonia Foster.  She is a 70 year old female with a history of depression and  anxiety and chronic pain.  She presented with prolonged illness.  The  patient started experiencing severe left lower quadrant abdominal pain.  When she was evaluated by CT scan of the abdomen and pelvis and  abdominal x-ray, she was found to have severe constipation and some  chronic __________  of the sigmoid colon.  At that time, the patient was  sent home.  After that she continued to experience abdominal pain and  she came again to the hospital where an abdominal x-ray showed evidence  of severe constipation.   PROCEDURES:  1. Abdominal x-ray which showed no acute cardiopulmonary disease,      severe constipation with multiple air/fluid levels with the      heaviest to lie within the  colon.  No evidence of a small bowel      obstruction.  2. Abdominal x-ray on September 7th, interval improvement with      decreased colonic distention, general amount of stool in the colon      beginning to be at least slightly less in degree now.  3. The patient had a Gastrografin enema with likely, no obstructing      colonic lesion was found.  4. Abdominal x-ray on September 8th, decrease in the amount of      distended colon with residual mild ileus of the colon.  5. EGD and colonoscopy planned on July 29, 2007, and to be      addressed on the date of discharge.   PROBLEM LIST:  1. Abdominal pain.  Abdominal pain was felt secondary to constipation.      The patient was started on bowel regimen to clean the colon from      the stool.  The patient was started on MiraLax 17 grams p.o. b.i.d.      with a  Fleet enema.  The patient continued to have a daily bowel      movement with the Fleet enema and with the MiraLax.  The patient is      being followed up with an abdominal x-ray on a daily basis to      evaluate the colonic distention and the stool which show      improvement in the colonic distention.  During the hospitalization,      the patient continued to complain of abdominal pain and with severe      grade, but there is no nausea or vomiting.  The patient was placed      on a clear liquid diet.  , there is no significant improvement on      the abdominal pain.  Gastroenterology was consulted.  Dr. Loreta Ave      evaluated the cause of the abdominal pain, where Dr. Loreta Ave saw the      patient and she already had a flexible sigmoidoscopy which was not      completed secondary to the excessive amount of stool and the      patient was advised to follow with her to repeat the colonoscopy.      Dr. Loreta Ave decided to go ahead with an EGD and colonoscopy to      evaluate other causes of abdominal pain which will be done      tomorrow.  2. Anemia.  Hemoglobin dropped from 12.6 to 9.3  about a 3 gram drop on      the hemoglobin.  The cause could be secondary to hemodilution.  We      need to rule out GI bleeding.  Anemia panel is pending and in any      circumstance the patient will go to EGD and colonoscopy to evaluate      if there is any evidence of upper or lower GI bleeding.  We will      continue to monitor her CBC.  During the hospitalization, the      patient did not require any kind of Foster transfusion.  3. Chronic renal insufficiency.  Creatinine remained stable around 1.7      since the date of admission.  4. Hypertension.  As mentioned __________  could have an effect on      constipation which was stopped.  And, the patient's Foster pressure      remained under good control with clonidine. at 0.1 mg p.o. b.i.d.  5. Hypothyroidism.  TSH was repeated which showed 6.5.  We will      increase Synthroid to 112 mcg.  As the patient has uncontrolled      hypothyroidism, it could have a role in the patient's above      symptoms.   CONDITION:  Stable and the patient possible to discharge home within 1-2  days after the colonoscopy and the EGD as the patient's pain today is  completely resolved.      Hind Bosie Helper, MD  Electronically Signed     HIE/MEDQ  D:  07/28/2007  T:  07/28/2007  Job:  [id number]

## 2011-04-02 NOTE — Consult Note (Signed)
[name], Foster NO.:  [id number]   MEDICAL RECORD NO.:  [id number]          PATIENT TYPE:  INP   LOCATION:  1338                         FACILITY:  [facility]   PHYSICIAN:  Anselmo Rod, M.D.  DATE OF BIRTH:  Feb 06, 1941   DATE OF CONSULTATION:  07/27/2007  DATE OF DISCHARGE:                                 CONSULTATION   REASONS FOR CONSULTATION:  1. Diffuse abdominal pain with chronic constipation and thickened      colon wall seen on CT done last month rule out colitis.  2. Anemia with a drop in hemoglobin from 14.8 grams per deciliter on      July 19, 2007, to 9.5 grams per deciliter today, rule out peptic      ulcer disease, arteriovenous malformation, masses, etcetera.  3. Gastroesophageal reflux disease on proton pump inhibitor.  4. History of adenomatous polyps removed in 2007.  5. Hypertension.  6. Chronic renal insufficiency with creatinine of 1.78 milligrams per      deciliter.  7. Obstructive sleep apnea.  8. Depression.  9. Osteoporosis and osteoarthritis.  10.Kyphoscoliosis.  11.Status post hysterectomy in the remote past.  12.History of 2 cesarean sections prior to the hysterectomy.  13.?left hip fracture (old fracture).   RECOMMENDATIONS:  1. EGD and colonoscopy planned on July 29, 2007, with propofol as      the patient had a difficult time with sedation during her last      colonoscopy.  2. Continue MiraLax 3-4 times a day.  3. Check iron and ferritin values.  4. Avoid all nonsteroidal antiinflammatories for now.  5. Discontinue Dilaudid.  6. Encourage the patient to get out of bed and ambulate.  7. Discontinue Cipro and Flagyl.   DISCUSSION:  [name] is a 70 year old white female with  multiple medical problems who has seen me on one occasion in the office  on July 29, 2006. Subsequently a colonoscopy was done and  adenomatous polyps were removed but there was a large amount of residual  stool in the colon.   The patient had evidence of visceral  hypersensitivity during the colonoscopy consistent with constipation  predominant IBS.  She presented to the [facility] Emergency Room on  July 19, 2007 with abdominal pain.  A CT scan of the abdomen and  pelvis was unrevealing except for a mild thickening of the colon and  some mesenteric adenopathy.  She was readmitted with severe abdominal  pain and chronic constipation on July 24, 2007.  She rates her pain  as an 8 to 9 out of 10 in spite of having multiple liquid stools since  admission.  There is no history of melena or hematochezia.  She can go 2-  3 days without a bowel movement.  She denies a history of colon cancer  in her family.  She had a colonoscopy as mentioned above last year when  adenomatous polyps were removed but the prep was very poor and repeat  colonoscopy had been recommended which the patient did not follow up  with.   ALLERGIES:  No known drug  allergies.   MEDICATIONS:  In the hospital are: Senokot, Protonix, MiraLax, Catapres,  multivitamin, Zoloft, Xanax, Amitiza, Fleet's enemas, ciprofloxacin,  metronidazole, Zofran p.r.n., Tylenol p.r.n., Dulcolax suppositories,  along with Dilaudid p.r.n.   PAST MEDICAL HISTORY:  See list above.   SOCIAL HISTORY:  The patient is single.  She has 2 grown children.  She  denies the use of alcohol, tobacco, or drugs.  She is disabled and  retired.   FAMILY HISTORY:  The patient admits to a family history of lung cancer  associated with her father who is now deceased.  There is liver cancer  associated with her mother and Hepatitis C associated with her brother  who is also deceased.  Tuberculosis associated with both parents.  Hypertension associated with another brother who is deceased.  The  patient denies a family history of breast, colon, brain, uterine or  cervical cancer.   PHYSICAL EXAMINATION:  GENERAL:  Reveals a very anxious, older, white  female in no acute  distress, laying comfortably in bed.  VITAL SIGNS:  Temperature 98.7, pulse of 83 per minute, respiratory rate  20, blood pressure 120/60, and 99% saturation on room air.  HEENT:  Facial symmetry is preserved.  Oropharyngeal mucosa without  exudate.  NECK:  Supple.  No JVD, thyromegaly, lymphadenopathy.  CHEST:  Clear to auscultation with occasional rhonchi.  S1 S2 regular.  ABDOMEN:  Soft with surgical scar from above mentioned surgery.  Diffusely tender with no rebound or rigidity, minimal guarding present.  EXTREMITIES:  Without edema, cyanosis, or clubbing.   LABORATORY EVALUATION:  Reveals a TSH of 1.969.  CMP reveals a sodium of  142, potassium 4.1, chloride 111, CO2 24, glucose 113, BUN 7, creatinine  1.78 with total bili 0.6, alk phos 50, AST 15, ALT 13, total protein  5.2, albumin 2.8, calcium 8.4.  CBC reveals a white count of 6.4K,  hemoglobin 9.5K, and platelets 144.  Initial hemoglobin on the 31st was  14.8 grams per deciliter. An Omnipaque enema done on September 7th,  revealed no obstructing colonic lesion.  A CT scan of the abdomen and  pelvis done on August 31st, revealed small amount of free pelvic fluid  and possible mild sigmoid colon thickening.  No diverticular chain  identified. There was no evidence of bowel obstruction.  The uterus was  surgically absent.  No adnexal mass was seen. X-ray of the left hip done  on May 5th revealed a questionable sclerotic area in the left femoral  neck which could represent an old impacted fracture.  An abdominal  ultrasound done on July 05, 2006, revealed mild gallbladder wall  thickening without any evidence of cholecystitis.  There was a limited  evaluation of the pancreas, abdominal aorta.  Mild left renal atrophy  was noted. Considering these findings recommendations have been made.  I  think the patient might benefit from a repeat abdominal ultrasound  tomorrow.  Further recommendations will be made in followup.       Anselmo Rod, M.D.  Electronically Signed     JNM/MEDQ  D:  07/27/2007  T:  07/27/2007  Job:  [id number]   cc:   Renaye Rakers, M.D.  Fax: [phone]

## 2011-04-02 NOTE — Assessment & Plan Note (Signed)
[name] is back regarding her back pain.  She had been doing fairly well  until mid-April when she was tripped by her dog and that increased back  and hip pain.  She rates her pain a 9 out of 10 today.  She uses her  Lidoderm patches somewhat, especially at nighttime.  She is unable to  tolerate Ultram except at night due to sedation.  She describes her pain  as sharp, burning, constant aching.  Pain is most predominant in the low  back, as well as the left hip which seems to have emerged more towards  the groin area.  She states that pain interferes with general activity,  relations with others and enjoyment of life on a moderate-to-severe  level.  Sleep is poor.  She has had periodic relief with trigger point  injections we have done in the past.  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   REVIEW OF SYMPTOMS:  Review of systems is notable for trouble walking,  spasms, dizziness, anxiety, wheezing, shortness of breath, diarrhea,  abdominal pain, constipation.  Full review is in the health and history  section.   SOCIAL HISTORY:  Patient lives alone and is without significant change  today.   PHYSICAL EXAMINATION:  VITAL SIGNS:  Blood pressure is 150/84, pulse is  90, respiratory rate 18.  She is saturating 98% on room air.  GENERAL:  Patient is pleasant in no acute distress.  She is alert and  oriented x3.  MUSCULOSKELETAL:  Patient stands with a level of scoliosis of the upper  lumbar and lower thoracic spine.  She had pain with extension and facet  maneuvers.  There is significant pain along the L3-L4, L4-L5, L5-S1  levels.  She also had some higher pain in the thoracic paraspinal area.  Motor function remains 5/5.  Patient had pain in the left groin  particularly with rotational stresses on the knee and resisted flexion.  Knee movement and ankle movement  was normal bilaterally.  MENTAL STATUS:  Cognitively she is appropriate.  HEART:  Regular.  CHEST:  Clear.  ABDOMEN:  Soft, nontender.   ASSESSMENT:  1. Chronic low back pain related to adult-onset scoliosis and lumbar      facet disease.  She has likely a myofascial component as well.  2. Left hip pain concerning for an osteoarthritis of the hip.   PLAN:  1. We will send the patient for hip x-rays to rule out significant      arthritic changes.  2. We will set patient up for bilateral medial branch block with Dr.      Wynn Banker at L3-S1.  3. Give patient a trial of Limbrel for arthritic pain in back and      hands.  She may benefit from an injection at the left [facility] joint of      the first digit.  4. Continue Ultram and Lidoderm patches at night.  5. I will see the patient back after completing injections with Dr.      Wynn Banker.      Ranelle Oyster, M.D.  Electronically Signed     ZTS/MedQ  D:  03/23/2007 12:05:23  T:  03/23/2007 12:56:48  Job #:  [id number]   cc:   Renaye Rakers, M.D.  Fax: [phone]

## 2011-04-02 NOTE — Procedures (Signed)
[name], [name] NO.:  [id number]   MEDICAL RECORD NO.:  [id number]           PATIENT TYPE:   LOCATION:                                 FACILITY:   PHYSICIAN:  Erick Colace, M.D.DATE OF BIRTH:  1941/09/16   DATE OF PROCEDURE:  04/20/2007  DATE OF DISCHARGE:                               OPERATIVE REPORT   PROCEDURE:  This is a left sacroiliac injection under fluoroscopic  guidance.   INDICATIONS:  Left buttock pain; she only had 30% relief with bilateral  medial branch blocks at L5, L4, L3.  Her pain seems to be more towards  the left side in the low back and buttock area.   Her pain is persistent despite medication management.   Informed consent was obtained after describing the risks and benefits of  procedure to the patient including bleeding, , bruising, infection, as  well as left lower extremity paralysis.  She wishes to proceed and has  given written consent.  The patient placed prone on fluoroscopy table.  Betadine prep, sterile drape, 25-gauge, 1-1/2-inch needle was used to  anesthetize the skin and subcu tissue; 1% lidocaine x2 mL and a 25-  gauge, 3-inch, spinal needle was inserted into the left SI joint under  fluoroscopic guidance.  AP and lateral imaging utilized.   Omnipaque 180 x 0.5 mL demonstrated no intravascular uptake, then 1.5 mL  of a solution containing 1 mL of 2% MPF lidocaine and 0.5 mL of 40 mg/mL  Kenalog were injected.  The patient tolerated the procedure well.  Pre-  and-post injection and vitals were stable.  Pre-injection pain level  7/10 and post-injection pain level 2/10.  She will follow up with Dr.  Riley Kill and may need reinjection once this seems to wear off.      Erick Colace, M.D.  Electronically Signed     AEK/MEDQ  D:  04/20/2007 14:12:23  T:  04/20/2007 18:53:07  Job:  [id number]   cc:   Ranelle Oyster, M.D.  Fax: [phone]

## 2011-04-05 NOTE — Procedures (Signed)
[name], [name] NO.:  [id number]   MEDICAL RECORD NO.:  [id number]          PATIENT TYPE:  OUT   LOCATION:  SLEEP CENTER                 FACILITY:  [facility]   PHYSICIAN:  Clinton D. Maple Hudson, M.D. DATE OF BIRTH:  May 29, 1941   DATE OF STUDY:  10/29/2005                              NOCTURNAL POLYSOMNOGRAM   REFERRING PHYSICIAN:  Dr. Renaye Rakers.   DATE OF STUDY:  October 29, 2005.   INDICATION FOR STUDY:  Hypersomnia with sleep apnea.   EPWORTH SLEEPINESS SCORE:  7/24.   BMI:  24.   WEIGHT:  140 pounds.   A baseline diagnostic NPSG on September 18, 2005 had reported an AHI 19.8 per  hour. C-PAP titration was requested.   SLEEP ARCHITECTURE:  Total sleep time 307 minutes with sleep efficiency 75%.  Stage I was 12%, stage II 48%, stages III and IV 7%, REM 33% of total sleep  time. Sleep latency 52 minutes, REM latency 148 minutes, awake after sleep  onset 26 minutes, arousal index 16. She had alprazolam but did not bring it  for the study.   RESPIRATORY DATA:  C-PAP titration protocol:  C-PAP was titrated to 11 CWP,  AHI 0 per hour. A medium ComfortGel mask was used with heated humidifier.   OXYGEN DATA:  Snoring was prevented and oxygen saturation held 96-97% on  room air with C-PAP.   CARDIAC DATA:  Normal sinus rhythm.   MOVEMENT/PARASOMNIA:  A total of 135 limb jerks were reported of which 16  were associated with arousal or awakening for periodic limb movement with  arousal index of 3.1 per hour which is increased.   IMPRESSION/RECOMMENDATIONS:  1.  Successful C-PAP titration to 11 CWP, AHI 0 per hour. A medium      ComfortGel mask was used with heated humidifier.  2.  Baseline diagnostic NPSG on September 18, 2005 had reported an AHI of 19.8      per hour.  3.  Periodic limb movement with arousal, 3.1 per hour.      Clinton D. Maple Hudson, M.D.  Diplomate, Biomedical engineer of Sleep Medicine  Electronically Signed     CDY/MEDQ  D:  11/03/2005  17:13:55  T:  11/04/2005 00:51:31  Job:  [id number]

## 2011-04-05 NOTE — Op Note (Signed)
[name], [name] NO.:  [id number]   MEDICAL RECORD NO.:  [id number]          PATIENT TYPE:  AMB   LOCATION:  ENDO                         FACILITY:  [facility]   PHYSICIAN:  Anselmo Rod, M.D.  DATE OF BIRTH:  November 12, 1941   DATE OF PROCEDURE:  08/25/2006  DATE OF DISCHARGE:                                 OPERATIVE REPORT   PROCEDURE:  Flexible sigmoidoscopy with snare polypectomy x3.   ENDOSCOPIST:  Anselmo Rod, M.D.   INSTRUMENT USED:  Olympus videocolonoscope (adjustable pediatric scope).   INDICATIONS FOR PROCEDURE:  70 year old white female with a history of  chronic constipation undergoing screening colonoscopy to rule out colonic  polyps, masses, etc.   PREPROCEDURE PREPARATION:  Informed consent was procured from the patient.  The patient had fasted for 8 hours prior to the procedure after being  prepped with a gallon of TriLyte (the patient did not consume the whole  gallon) the night prior to the procedure.  The risks and benefits of the  procedure, including a 10% missed rate of cancer and polyps, was discussed  with the patient as well.   PREPROCEDURE PHYSICAL:  VITAL SIGNS:  Stable.  NECK:  Supple.  CHEST:  Clear to auscultation.  S1 and S2 regular.  ABDOMEN:  Soft with normal bowel sounds.   DESCRIPTION OF PROCEDURE:  The patient was placed in the left lateral  decubitus position and sedated with 140 mcg of Fentanyl and 12 mg of Versed  given intravenously in slow incremental doses.  Once the patient was  adequately sedated and maintained on low-flow oxygen and continuous cardiac  monitoring, the Olympus videocolonoscope was advanced from the rectum to the  mid-transverse colon with difficulty.  The patient had a significant amount  of residual stool in the colon.  Multiple washings were done.  Visualization  was poor.  A pedunculated polyp was snared from 60 cm.  Two sessile polyps  were snared from 60 to 70 cm with a hot snare  forceps.  As the visualization  was inadequate, the procedure was aborted at the transverse colon.  Retroflexion in the rectum revealed small internal hemorrhoids.  The patient  had significant abdominal discomfort with insufflation of air into the  colon, indicating a component of visceral hypersensitivity consistent with  IBS.   IMPRESSION:  1. Small internal hemorrhoid.  2. Three polyps removed from the left colon, one was pedunculated and two      were small and sessile.  This was done by hot snare.  3. Visceral hypersensitivity with insufflation of air into the colon,      indicating a component of irritable bowel syndrome.  4. Complete visualization of the transverse colon, right colon, and cecum      was not possible because of the poor prep and poor patient tolerance of      the procedure.   RECOMMENDATIONS:  1. She will be reprepped and redone under Diprivan on August 28, 2006.  2. She will be maintained on a liquid diet for three days prior to the  procedure.  3. Further recommendations will be made on followup.  4. Avoid all nonsteroidals for the next four weeks.      Anselmo Rod, M.D.  Electronically Signed     JNM/MEDQ  D:  08/25/2006  T:  08/26/2006  Job:  [id number]   cc:   Renaye Rakers, M.D.

## 2011-05-06 ENCOUNTER — Institutional Professional Consult (permissible substitution): Payer: Medicaid Other | Admitting: Internal Medicine

## 2011-05-21 ENCOUNTER — Ambulatory Visit: Payer: PRIVATE HEALTH INSURANCE | Attending: Internal Medicine | Admitting: Physical Therapy

## 2011-05-21 DIAGNOSIS — R52 Pain, unspecified: Secondary | ICD-10-CM | POA: Insufficient documentation

## 2011-05-21 DIAGNOSIS — IMO0001 Reserved for inherently not codable concepts without codable children: Secondary | ICD-10-CM | POA: Insufficient documentation

## 2011-06-10 ENCOUNTER — Institutional Professional Consult (permissible substitution): Payer: Medicaid Other | Admitting: Internal Medicine

## 2011-07-05 ENCOUNTER — Institutional Professional Consult (permissible substitution): Payer: Medicaid Other | Admitting: Internal Medicine

## 2011-07-18 ENCOUNTER — Ambulatory Visit (INDEPENDENT_AMBULATORY_CARE_PROVIDER_SITE_OTHER): Payer: PRIVATE HEALTH INSURANCE | Admitting: Internal Medicine

## 2011-07-18 ENCOUNTER — Encounter: Payer: Self-pay | Admitting: Internal Medicine

## 2011-07-18 VITALS — BP 130/80 | HR 81 | Ht 63.0 in | Wt 142.2 lb

## 2011-07-18 DIAGNOSIS — Z23 Encounter for immunization: Secondary | ICD-10-CM

## 2011-07-18 DIAGNOSIS — G4700 Insomnia, unspecified: Secondary | ICD-10-CM

## 2011-07-18 DIAGNOSIS — G4733 Obstructive sleep apnea (adult) (pediatric): Secondary | ICD-10-CM | POA: Insufficient documentation

## 2011-07-18 DIAGNOSIS — G4730 Sleep apnea, unspecified: Secondary | ICD-10-CM

## 2011-07-18 MED ORDER — ZALEPLON 10 MG PO CAPS: 10.0000 mg | ORAL_CAPSULE | Freq: Every day | ORAL | Status: AC

## 2011-07-18 NOTE — Assessment & Plan Note (Addendum)
Confirmed OSA but she couldn't tolerate her mask. She has had this machine 6 years and may be elligible to start over. I will see if our [facility] can connect with a local DME. Meanwhile, we will try sonata for shorter half-life.  Recognize that renal insufficiency, somatic complaints, anxiety and depression Will all impact sleep quality.

## 2011-07-18 NOTE — Patient Instructions (Signed)
Script for zaleplon for sleep- try taking it 15 to 20 minutes before bedtime  Order- [facility]  New CPAP autotitrate, for pressure recommendation    5-15 cwp x 2 weeks, mask of choice, humidifier

## 2011-07-18 NOTE — Progress Notes (Signed)
Subjective:    Patient ID: [name], female    DOB: [birth year], 70 y.o.   MRN: [id number]  HPI 07/18/11- 70 year old female never smoker referred courtesy of Dr. Parke Simmers sleep evaluation. In the last 6 years and she has had difficulty initiating and maintaining sleep. She has tried white noise Ambien(caused sleep driving), Lunesta (tried only once, not helpful), Xanax (insufficient). As a sleepwalker as a child. Says she always feels "uptight" but not threatened. She does admit some financial stress. Review of sleep hygiene indicates bedtime around 11:30 PM with sleep latency at least one hour and repeated waking during the night if she sleeps at all. She gets up between 4 and 5 AM. She has not been told she snores. Can never fall asleep in the daytime. Caffeine is limited to 2 soft drinks daily. Avoided alcohol and drugs herself but says both parents were alcoholics and 2 of her 3 brothers were alcoholics who took their own lives.  He is history of chronic renal insufficiency but is doing well without dialysis. ENT evaluation by Dr.Teoh because of vertigo/falls. An MRI from that evaluation showed "lesions on the brain" . She does not know a formal ENT or neurologic diagnosis. NPSG 09/18/2005 showed Epworth score 7/24, moderate obstructive sleep apnea, AHI 19.8 per hour with moderate to loud snoring, desaturation to 89% on room air and difficulty initiating sleep until 1:30 AM. She had a CPAP machine at never got comfortable with the mask which hurt her mouth. She has not used CPAP in years.  Review of Systems Constitutional:   No-   weight loss, night sweats, fevers, chills, fatigue, lassitude. HEENT:   + headaches, No-difficulty swallowing, tooth/dental problems, sore throat,       No-  sneezing, itching, ear ache, nasal congestion, post nasal drip,  CV:  No-   chest pain, orthopnea, PND, swelling in lower extremities, anasarca, Dizziness,+ palpitations Resp: No-   shortness of breath with  exertion or at rest.              No-   productive cough,  No non-productive cough,  No-  coughing up of blood.              No-   change in color of mucus.  No- wheezing.   Skin: No-   rash or lesions. GI:  No-   heartburn, indigestion, abdominal pain, nausea, vomiting, diarrhea,                 change in bowel habits, loss of appetite GU: No-   dysuria, change in color of urine, no urgency or frequency.  No- flank pain. MS:  +  joint pain or swelling.  No- decreased range of motion.  No- back pain. Neuro- grossly normal to observation, Or:  Psych:  No- change in mood or affect. + depression and anxiety.  No memory loss.      Objective:   Physical Exam General- Alert, Oriented, Affect-appropriate, Distress- none acute. Medium build Skin- rash-none, lesions- none, excoriation- none Lymphadenopathy- none Head- atraumatic            Eyes- Gross vision intact, PERRLA, conjunctivae clear secretions            Ears- Hearing, canals-normal            Nose- Clear, no-Septal dev, mucus, polyps, erosion, perforation             Throat- Mallampati III , mucosa clear , drainage- none, tonsils- atrophic Neck-  flexible , trachea midline, no stridor , thyroid nl, carotid no bruit Chest - symmetrical excursion , unlabored           Heart/CV- RRR , no murmur , no gallop  , no rub, nl s1 s2                           - JVD- none , edema- none, stasis changes- none, varices- none           Lung- clear to P&A, wheeze- none, cough- none , dullness-none, rub- none           Chest wall-  Abd- tender-no, distended-no, bowel sounds-present, HSM- no Br/ Gen/ Rectal- Not done, not indicated Extrem- cyanosis- none, clubbing, none, atrophy- none, strength- nl Neuro- grossly intact to observation         Assessment & Plan:

## 2011-07-21 ENCOUNTER — Encounter: Payer: Self-pay | Admitting: Internal Medicine

## 2011-08-30 LAB — BASIC METABOLIC PANEL
BUN: 11
BUN: 17
CO2: 27
Calcium: 8.3 — ABNORMAL LOW
Calcium: 8.3 — ABNORMAL LOW
Chloride: 111
Chloride: 114 — ABNORMAL HIGH
Creatinine, Ser: 1.54 — ABNORMAL HIGH
Creatinine, Ser: 1.55 — ABNORMAL HIGH
Creatinine, Ser: 1.73 — ABNORMAL HIGH
Creatinine, Ser: 1.73 — ABNORMAL HIGH
GFR calc Af Amer: 36 — ABNORMAL LOW
GFR calc Af Amer: 41 — ABNORMAL LOW
GFR calc Af Amer: 41 — ABNORMAL LOW
GFR calc non Af Amer: 30 — ABNORMAL LOW
GFR calc non Af Amer: 30 — ABNORMAL LOW
GFR calc non Af Amer: 34 — ABNORMAL LOW
Glucose, Bld: 115 — ABNORMAL HIGH
Potassium: 4.9
Sodium: 143
Sodium: 144

## 2011-08-30 LAB — CBC
HCT: 27 — ABNORMAL LOW
HCT: 42.8
Hemoglobin: 9.5 — ABNORMAL LOW
Hemoglobin: 9.7 — ABNORMAL LOW
Hemoglobin: 9.8 — ABNORMAL LOW
MCHC: 34.6
MCHC: 34.5
MCV: 88
MCV: 88.3
MCV: 88.3
MCV: 88.9
MCV: 89.4
MCV: 88.3
Platelets: 144 — ABNORMAL LOW
Platelets: 178
Platelets: 181
Platelets: 221
RBC: 3.16 — ABNORMAL LOW
RBC: 3.19 — ABNORMAL LOW
RBC: 3.5 — ABNORMAL LOW
RBC: 4.85
RDW: 13.8
WBC: 11.2 — ABNORMAL HIGH
WBC: 12.2 — ABNORMAL HIGH
WBC: 6.4
WBC: 6.9
WBC: 8.3
WBC: 9.2
WBC: 13 — ABNORMAL HIGH

## 2011-08-30 LAB — DIFFERENTIAL
Basophils Absolute: 0
Eosinophils Relative: 1
Lymphocytes Relative: 19
Lymphocytes Relative: 12
Lymphs Abs: 1.7
Monocytes Absolute: 0.7
Monocytes Relative: 8
Neutro Abs: 10.5 — ABNORMAL HIGH
Neutrophils Relative %: 81 — ABNORMAL HIGH

## 2011-08-30 LAB — URINALYSIS, ROUTINE W REFLEX MICROSCOPIC
Glucose, UA: NEGATIVE
Hgb urine dipstick: NEGATIVE
Nitrite: NEGATIVE
Specific Gravity, Urine: 1.022
Specific Gravity, Urine: 1.02
Urobilinogen, UA: 0.2
pH: 5.5

## 2011-08-30 LAB — LIPASE, BLOOD: Lipase: 35

## 2011-08-30 LAB — COMPREHENSIVE METABOLIC PANEL
AST: 20
Albumin: 4
Alkaline Phosphatase: 50
BUN: 7
BUN: 24 — ABNORMAL HIGH
CO2: 24
CO2: 23
Calcium: 9
Chloride: 107
Chloride: 111
Chloride: 105
Creatinine, Ser: 1.78 — ABNORMAL HIGH
Creatinine, Ser: 1.85 — ABNORMAL HIGH
Creatinine, Ser: 2.03 — ABNORMAL HIGH
GFR calc Af Amer: 33 — ABNORMAL LOW
GFR calc non Af Amer: 29 — ABNORMAL LOW
GFR calc non Af Amer: 25 — ABNORMAL LOW
Glucose, Bld: 113 — ABNORMAL HIGH
Potassium: 4.1
Total Bilirubin: 0.6
Total Bilirubin: 0.6
Total Bilirubin: 0.8
Total Protein: 7.3

## 2011-08-30 LAB — URINE CULTURE: Colony Count: NO GROWTH

## 2011-08-30 LAB — AMMONIA: Ammonia: 23

## 2011-08-30 LAB — TSH
TSH: 1.969
TSH: 6.118 — ABNORMAL HIGH

## 2011-09-02 ENCOUNTER — Ambulatory Visit (INDEPENDENT_AMBULATORY_CARE_PROVIDER_SITE_OTHER): Payer: PRIVATE HEALTH INSURANCE | Admitting: Internal Medicine

## 2011-09-02 ENCOUNTER — Encounter: Payer: Self-pay | Admitting: Internal Medicine

## 2011-09-02 VITALS — BP 124/80 | HR 90 | Ht 63.0 in | Wt 147.8 lb

## 2011-09-02 DIAGNOSIS — G4733 Obstructive sleep apnea (adult) (pediatric): Secondary | ICD-10-CM

## 2011-09-02 DIAGNOSIS — G4700 Insomnia, unspecified: Secondary | ICD-10-CM

## 2011-09-02 MED ORDER — CLONAZEPAM 0.5 MG PO TABS: ORAL_TABLET | ORAL | Status: DC

## 2011-09-02 NOTE — Progress Notes (Signed)
Subjective:  Patient ID: [name], female    DOB: 24-Oct-1941, 70 y.o.   MRN: [id number]  HPI 07/18/11- 70 year old female never smoker referred courtesy of Dr. Parke Simmers for sleep evaluation. In the last 6 years and she has had difficulty initiating and maintaining sleep. She has tried white noise Ambien(caused sleep driving), Lunesta (tried only once, not helpful), Xanax (insufficient). As a sleepwalker as a child. Says she always feels "uptight" but not threatened. She does admit some financial stress. Review of sleep hygiene indicates bedtime around 11:30 PM with sleep latency at least one hour and repeated waking during the night if she sleeps at all. She gets up between 4 and 5 AM. She has not been told she snores. Can never fall asleep in the daytime. Caffeine is limited to 2 soft drinks daily. Avoided alcohol and drugs herself but says both parents were alcoholics and 2 of her 3 brothers were alcoholics who took their own lives.  He is history of chronic renal insufficiency but is doing well without dialysis. ENT evaluation by Dr.Teoh because of vertigo/falls. An MRI from that evaluation showed "lesions on the brain" . She does not know a formal ENT or neurologic diagnosis. NPSG 09/18/2005 showed Epworth score 7/24, moderate obstructive sleep apnea, AHI 19.8 per hour with moderate to loud snoring, desaturation to 89% on room air and difficulty initiating sleep until 1:30 AM. She had a CPAP machine but never got comfortable with the mask which hurt her mouth. She has not used CPAP in years.  09/02/11- 69 yoF followed for obstructive sleep apnea. Since last here found Sonata did not help for sleep. Auto titration showed good CPAP compliance and pressure control at 12 using a nasal pillows mask. She notices sleep walking and sleeping eating. She has been an occasional sleepwalker since childhood.   Review of Systems Constitutional:   No-   weight loss, night sweats, fevers, chills, fatigue,  lassitude. HEENT:   + headaches, No-difficulty swallowing, tooth/dental problems, sore throat,       No-  sneezing, itching, ear ache, nasal congestion, post nasal drip,  CV:  No-   chest pain, orthopnea, PND, swelling in lower extremities, anasarca, Dizziness,+ palpitations Resp: No-   shortness of breath with exertion or at rest.              No-   productive cough,  No non-productive cough,  No-  coughing up of blood.              No-   change in color of mucus.  No- wheezing.   Skin: No-   rash or lesions. GI:  No-   heartburn, indigestion, abdominal pain, nausea, vomiting, diarrhea,                 change in bowel habits, loss of appetite GU: No-   dysuria, change in color of urine, no urgency or frequency.  No- flank pain. MS:  +  joint pain or swelling.  No- decreased range of motion.  No- back pain. Neuro- grossly normal to observation, Or:  Psych:  No- change in mood or affect. + depression and anxiety.  No memory loss.      Objective:   Physical Exam General- Alert, Oriented, Affect-appropriate, Distress- none acute. Medium/ heavy build Skin- rash-none, lesions- none, excoriation- none Lymphadenopathy- none Head- atraumatic            Eyes- Gross vision intact, PERRLA, conjunctivae clear secretions  Ears- Hearing, canals-normal            Nose- Clear, no-Septal dev, mucus, polyps, erosion, perforation             Throat- Mallampati III , mucosa clear , drainage- none, tonsils- atrophic Neck- flexible , trachea midline, no stridor , thyroid nl, carotid no bruit Chest - symmetrical excursion , unlabored           Heart/CV- RRR , no murmur , no gallop  , no rub, nl s1 s2                           - JVD- none , edema- none, stasis changes- none, varices- none           Lung- clear to P&A, wheeze- none, cough- none , dullness-none, rub- none           Chest wall-  Abd- tender-no, distended-no, bowel sounds-present, HSM- no Br/ Gen/ Rectal- Not done, not  indicated Extrem- cyanosis- none, clubbing, none, atrophy- none, strength- nl Neuro- grossly intact to observation

## 2011-09-02 NOTE — Patient Instructions (Addendum)
Order- [facility]CC- [facility]- change CPAP to fixed 9 cwp dx OSA  Script clonazepam for sleep instead of [employer]

## 2011-09-04 NOTE — Assessment & Plan Note (Addendum)
I think she can do well. It looks like a better start with CPAP this time around. We will replace Sonata with clonazepam for longer half-life, noting that she failed Ambien, Lunesta, Xanax. We're changing to a fixed CPAP pressure and have chosen to start at 9 after discussion

## 2011-09-11 ENCOUNTER — Encounter: Payer: Self-pay | Admitting: Internal Medicine

## 2011-10-11 ENCOUNTER — Telehealth: Payer: Self-pay | Admitting: Internal Medicine

## 2011-10-11 NOTE — Telephone Encounter (Signed)
Jess. Did you call this pt?  I don't see any open phone notes or test results.  Please advise.  Thanks!

## 2011-10-11 NOTE — Telephone Encounter (Signed)
No, I did not call the patient.  I called [facility] to obtain the download that was ordered at her last ov for her upcoming appt with Dr Maple Hudson on 11.26.12.  I will let Florentina Addison know and sign off on the message.

## 2011-10-14 ENCOUNTER — Ambulatory Visit (INDEPENDENT_AMBULATORY_CARE_PROVIDER_SITE_OTHER): Payer: PRIVATE HEALTH INSURANCE | Admitting: Internal Medicine

## 2011-10-14 ENCOUNTER — Encounter: Payer: Self-pay | Admitting: Internal Medicine

## 2011-10-14 VITALS — BP 130/76 | HR 73 | Ht 63.0 in | Wt 144.2 lb

## 2011-10-14 DIAGNOSIS — G4730 Sleep apnea, unspecified: Secondary | ICD-10-CM

## 2011-10-14 DIAGNOSIS — G4700 Insomnia, unspecified: Secondary | ICD-10-CM

## 2011-10-14 NOTE — Progress Notes (Signed)
Patient ID: [name], female    DOB: 11/01/1941, 70 y.o.   MRN: [id number] HPI 07/18/11- 70 year old female never smoker referred courtesy of Dr. Parke Simmers for sleep evaluation. In the last 6 years and she has had difficulty initiating and maintaining sleep. She has tried white noise Ambien(caused sleep driving), Lunesta (tried only once, not helpful), Xanax (insufficient). As a sleepwalker as a child. Says she always feels "uptight" but not threatened. She does admit some financial stress. Review of sleep hygiene indicates bedtime around 11:30 PM with sleep latency at least one hour and repeated waking during the night if she sleeps at all. She gets up between 4 and 5 AM. She has not been told she snores. Can never fall asleep in the daytime. Caffeine is limited to 2 soft drinks daily. Avoided alcohol and drugs herself but says both parents were alcoholics and 2 of her 3 brothers were alcoholics who took their own lives.  He is history of chronic renal insufficiency but is doing well without dialysis. ENT evaluation by Dr.Teoh because of vertigo/falls. An MRI from that evaluation showed "lesions on the brain" . She does not know a formal ENT or neurologic diagnosis. NPSG 09/18/2005 showed Epworth score 7/24, moderate obstructive sleep apnea, AHI 19.8 per hour with moderate to loud snoring, desaturation to 89% on room air and difficulty initiating sleep until 1:30 AM. She had a CPAP machine but never got comfortable with the mask which hurt her mouth. She has not used CPAP in years.  09/02/11- 69 yoF followed for obstructive sleep apnea. Since last here found Sonata did not help for sleep. Auto titration showed good CPAP compliance and pressure control at 12 using a nasal pillows mask. She notices sleep walking and sleeping eating. She has been an occasional sleepwalker since childhood.  10/14/11-  5 yoF never smoker followed for obstructive sleep apnea. CPAP at 9 CWP did well for her. She did have the  home care company check it when she thought it wasn't working as well. She then left it at home when she traveled for a few days. During that time she came to appreciate how much it really helped. Since then she is much more comfortable with CPAP, using it all night every night. She still has trouble initiating sleep and describes lying down and trying to Will herself to sleep. I discussed sleep hygiene and her clonazepam.  Review of Systems-see HPI Constitutional:   No-   weight loss, night sweats, fevers, chills, fatigue, lassitude. HEENT:   + headaches, No-difficulty swallowing, tooth/dental problems, sore throat,       No-  sneezing, itching, ear ache, nasal congestion, post nasal drip,  CV:  No-   chest pain, orthopnea, PND, swelling in lower extremities, anasarca, Dizziness,+ palpitations Resp: No-   shortness of breath with exertion or at rest.              No-   productive cough,  No non-productive cough,  No-  coughing up of blood.              No-   change in color of mucus.  No- wheezing.   Skin: No-   rash or lesions. GI:  No-   heartburn, indigestion, abdominal pain, nausea, vomiting, diarrhea,                 change in bowel habits, loss of appetite GU: No-   dysuria, change in color of urine, no urgency or frequency.  No-  flank pain. MS:  +  joint pain or swelling.  No- decreased range of motion.  No- back pain. Neuro- grossly normal to observation, Or:  Psych:  No- change in mood or affect. + depression and anxiety.  No memory loss.      Objective:   Physical Exam General- Alert, Oriented, Affect-appropriate, Distress- none acute.  Skin- rash-none, lesions- none, excoriation- none Lymphadenopathy- none Head- atraumatic            Eyes- Gross vision intact, PERRLA, conjunctivae clear secretions            Ears- Hearing, canals-normal            Nose- Clear, no-Septal dev, mucus, polyps, erosion, perforation             Throat- Mallampati IV , mucosa clear , drainage- none,  tonsils- atrophic Neck- flexible , trachea midline, no stridor , thyroid nl, carotid no bruit Chest - symmetrical excursion , unlabored           Heart/CV- RRR , no murmur , no gallop  , no rub, nl s1 s2                           - JVD- none , edema- none, stasis changes- none, varices- none           Lung- clear to P&A, wheeze- none, cough- none , dullness-none, rub- none           Chest wall-  Abd- tender-no, distended-no, bowel sounds-present, HSM- no Br/ Gen/ Rectal- Not done, not indicated Extrem- cyanosis- none, clubbing, none, atrophy- none, strength- nl Neuro- grossly intact to observation

## 2011-10-14 NOTE — Assessment & Plan Note (Signed)
Pressure is now set at 9 and seems to be effective and well-tolerated. She has a psychophysiologic insomnia pattern. I suggested she take one or 2 of her clonazepam tablets 30 minutes before bedtime. I asked her to been read or do something similar distracting until she is tired of her brain does not worry about being unable to fall asleep.

## 2011-10-14 NOTE — Patient Instructions (Signed)
We will leave the CPAP set now at 9  Try the clonazepam- 1 or 2 tabs about 30 minutes before bedtime. Then get comfortable and do something distracting and interesting- word puzzles or read- give yourself time to drift off without worrying about sleep.

## 2011-11-26 ENCOUNTER — Telehealth: Payer: Self-pay | Admitting: Internal Medicine

## 2011-11-26 NOTE — Telephone Encounter (Signed)
I spoke with pt and she states the clonazepam 0.5mg  is not helping her sleep. She takes 1 at bedtime every night since 10/14/11. Pt has an apt scheduled to see CDY 12/17/11 at 2:45. Pt is requesting recs before then. Please advise Dr. Maple Hudson, thanks

## 2011-11-26 NOTE — Telephone Encounter (Signed)
Pt aware of directions from [facility].

## 2011-11-26 NOTE — Telephone Encounter (Signed)
Ok with remaining supply of clonazepam, to take 1-3 tabs as needed for sleep.

## 2011-11-27 ENCOUNTER — Telehealth: Payer: Self-pay | Admitting: Internal Medicine

## 2011-11-27 MED ORDER — CLONAZEPAM 0.5 MG PO TABS: ORAL_TABLET | ORAL | Status: DC

## 2011-11-27 NOTE — Telephone Encounter (Signed)
lmovm  Pt aware clonazepam 0.5 #90 2-3 for sleep qhs /prn No refill until OV. Called to [address]

## 2011-11-27 NOTE — Telephone Encounter (Signed)
Called spoke with patient who stated that she 3 clonazepam 30-40 minutes before bedtime, and slept from 11/11:30pm to 8am with no disturbances.  Pt is requesting a new rx for this medication.  Upcoming appt 1.29.13.  Dr Maple Hudson please advise, thanks.

## 2011-12-17 ENCOUNTER — Encounter: Payer: Self-pay | Admitting: Internal Medicine

## 2011-12-17 ENCOUNTER — Ambulatory Visit (INDEPENDENT_AMBULATORY_CARE_PROVIDER_SITE_OTHER): Payer: PRIVATE HEALTH INSURANCE | Admitting: Internal Medicine

## 2011-12-17 VITALS — BP 116/80 | HR 101 | Ht 63.0 in | Wt 150.0 lb

## 2011-12-17 DIAGNOSIS — G4700 Insomnia, unspecified: Secondary | ICD-10-CM

## 2011-12-17 DIAGNOSIS — G4730 Sleep apnea, unspecified: Secondary | ICD-10-CM

## 2011-12-17 MED ORDER — ZALEPLON 5 MG PO CAPS: ORAL_CAPSULE | ORAL | Status: DC

## 2011-12-17 NOTE — Progress Notes (Signed)
Patient ID: ERINE PHENIX, female    DOB: 1941-03-24, 71 y.o.   MRN: [id number] HPI 07/18/11- 71 year old female never smoker referred courtesy of Dr. Parke Simmers for sleep evaluation. In the last 6 years and she has had difficulty initiating and maintaining sleep. She has tried white noise Ambien(caused sleep driving), Lunesta (tried only once, not helpful), Xanax (insufficient). As a sleepwalker as a child. Says she always feels "uptight" but not threatened. She does admit some financial stress. Review of sleep hygiene indicates bedtime around 11:30 PM with sleep latency at least one hour and repeated waking during the night if she sleeps at all. She gets up between 4 and 5 AM. She has not been told she snores. Can never fall asleep in the daytime. Caffeine is limited to 2 soft drinks daily. Avoided alcohol and drugs herself but says both parents were alcoholics and 2 of her 3 brothers were alcoholics who took their own lives.  He is history of chronic renal insufficiency but is doing well without dialysis. ENT evaluation by Dr.Teoh because of vertigo/falls. An MRI from that evaluation showed "lesions on the brain" . She does not know a formal ENT or neurologic diagnosis. NPSG 09/18/2005 showed Epworth score 7/24, moderate obstructive sleep apnea, AHI 19.8 per hour with moderate to loud snoring, desaturation to 89% on room air and difficulty initiating sleep until 1:30 AM. She had a CPAP machine but never got comfortable with the mask which hurt her mouth. She has not used CPAP in years.FOLLOWS FOR: takes approx 1 hour to fall asleep withuse of Cloazepam and wakes up several times; when using Alprazolam 3 qhs able to sleep through the night no troubles(states Dr Parke Simmers is trying to get patient off of RX)  09/02/11- 69 yoF followed for obstructive sleep apnea. Since last here found Sonata did not help for sleep. Auto titration showed good CPAP compliance and pressure control at 12 using a nasal pillows mask. She  notices sleep walking and sleeping eating. She has been an occasional sleepwalker since childhood.  10/14/11-  74 yoF never smoker followed for obstructive sleep apnea. CPAP at 9 CWP did well for her. She did have the home care company check it when she thought it wasn't working as well. She then left it at home when she traveled for a few days. During that time she came to appreciate how much it really helped. Since then she is much more comfortable with CPAP, using it all night every night. She still has trouble initiating sleep and describes lying down and trying to Will herself to sleep. I discussed sleep hygiene and her clonazepam.  12/17/11-  69 yoF never smoker followed for obstructive sleep apnea. Takes approx 1 hour to fall asleep with use of Clonazepam and wakes up several times; when using Alprazolam 3 qhs able to sleep through the night no troubles(states Dr Parke Simmers is trying to get patient off of RX) There was some kind of a spell associated with marked agitation after a family argument. She ended up at the [facility] emergency room. Dr. Parke Simmers is sending her to neurology for memory evaluation and wants her off of the alprazolam. Unfortunately the patient feels alprazolam worked much better than clonazepam for chronic insomnia. She denies any sense of overmedication or sedation from these meds, carrying over into daytime. She continues CPAP 9 CWP/ [facility], all night every night with no report of breakthrough snoring or unusual behavior during sleep.  Review of Systems-see HPI Constitutional:   No-  weight loss, night sweats, fevers, chills, fatigue, lassitude. HEENT:   + headaches, No-difficulty swallowing, tooth/dental problems, sore throat,       No-  sneezing, itching, ear ache, nasal congestion, post nasal drip,  CV:  No-   chest pain, orthopnea, PND, swelling in lower extremities, anasarca, Dizziness,+ palpitations Resp: No-   shortness of breath with exertion or at rest.              No-    productive cough,  No non-productive cough,  No-  coughing up of blood.              No-   change in color of mucus.  No- wheezing.   Skin: No-   rash or lesions. GI:  No-   heartburn, indigestion, abdominal pain, nausea, vomiting, diarrhea,                 change in bowel habits, loss of appetite GU:  MS:  +  joint pain or swelling.  No- decreased range of motion.  No- back pain. Neuro- grossly normal to observation, Or:  Psych:  No- change in mood or affect. + depression and anxiety.  No memory loss.      Objective:   Physical Exam General- Alert, Oriented, Affect-appropriate, oriented and reasonable at this time, reading a book., Distress- none acute.  Skin- rash-none, lesions- none, excoriation- none Lymphadenopathy- none Head- atraumatic            Eyes- Gross vision intact, PERRLA, conjunctivae clear secretions            Ears- Hearing, canals-normal            Nose- Clear, no-Septal dev, mucus, polyps, erosion, perforation             Throat- Mallampati IV , mucosa clear , drainage- none, tonsils- atrophic, dentures Neck- flexible , trachea midline, no stridor , thyroid nl, carotid no bruit Chest - symmetrical excursion , unlabored           Heart/CV- RRR , no murmur , no gallop  , no rub, nl s1 s2                           - JVD- none , edema- none, stasis changes- none, varices- none           Lung- clear to P&A, wheeze- none, cough- none , dullness-none, rub- none           Chest wall-  Abd- Br/ Gen/ Rectal- Not done, not indicated Extrem- cyanosis- none, clubbing, none, atrophy- none, strength- nl Neuro- grossly intact to observation

## 2011-12-17 NOTE — Patient Instructions (Signed)
Try Sonata/ zalpelon for sleep- chosen because it is less likely to linger on into the day time. Don't take it unless you plan to sleep at least 3-4 hours longer.  Continue using your CPAP at 9 cwp

## 2011-12-18 NOTE — Assessment & Plan Note (Signed)
She has continued CPAP at 9 CWP without recognized breakthrough snoring or apnea. She is comfortable with this pressure and compliant with the machine. Her chronic insomnia component was discussed in detail again today. Sleep hygiene is fair. She may become a candidate for behavioral therapy/CBT as part of a broader neuropsychiatric evaluation if appropriate. In order to get away from longer acting medication, I have asked her to stop the clonazepam and alprazolam. Instead we will let her try a short half-life medication-Sonata.

## 2011-12-23 ENCOUNTER — Telehealth: Payer: Self-pay | Admitting: Internal Medicine

## 2011-12-23 MED ORDER — TRAZODONE HCL 50 MG PO TABS: 50.0000 mg | ORAL_TABLET | Freq: Every day | ORAL | Status: DC

## 2011-12-23 NOTE — Telephone Encounter (Signed)
I spoke with pt and is aware of cdy recs. Pt also aware to stop the sonata and the directions for the trazodone. Pt had no further questions

## 2011-12-23 NOTE — Telephone Encounter (Signed)
I spoke with pt and she states the rx sonata does not help her at all. Pt states Dr. Parke Simmers took her off the xanax and thinks this may have something to do with it. Pt states she has tried sonata 5mg , 10 mg and klonopin 0.5 mg and none of these work. Pt states she does not know what else to do. Please advise Dr. Maple Hudson, thanks  No Known Allergies

## 2011-12-23 NOTE — Telephone Encounter (Signed)
Per [facility]-give Trazodone 50 mg #30 take 1 tablet by mouth qhs with 1 refill.

## 2012-02-17 ENCOUNTER — Ambulatory Visit (INDEPENDENT_AMBULATORY_CARE_PROVIDER_SITE_OTHER): Payer: Medicare Other | Admitting: Internal Medicine

## 2012-02-17 ENCOUNTER — Encounter: Payer: Self-pay | Admitting: Internal Medicine

## 2012-02-17 VITALS — BP 108/68 | HR 77 | Ht 63.0 in | Wt 138.0 lb

## 2012-02-17 DIAGNOSIS — G4700 Insomnia, unspecified: Secondary | ICD-10-CM

## 2012-02-17 NOTE — Assessment & Plan Note (Addendum)
Good CPAP compliance and control now at 9 cwp/[facility]. Her main concern is the difficulty initiating and maintaining sleep. She chose not to keep the appointment her PCP, Dr Parke Simmers, made with neurology for evaluation of memory. We had an extensive discussion of sleep hygiene,, and issues contributing to insomnia, and therapeutic alternatives including a review of her medication history. I talked with her about trying CBT, rather than yet more medication at this point.

## 2012-02-17 NOTE — Patient Instructions (Signed)
Order- Refer psychologist Dr Carlus Pavlov, PHD, [facility], [phone]   Dx Chronic Insomnia

## 2012-02-17 NOTE — Progress Notes (Signed)
Patient ID: [name], female    DOB: 1941-04-12, 71 y.o.   MRN: [id number] HPI 07/18/11- 71 year old female never smoker referred courtesy of Dr. Parke SimmersBland for sleep evaluation. In the last 6 years and she has had difficulty initiating and maintaining sleep. She has tried white noise Ambien(caused sleep driving), Lunesta (tried only once, not helpful), Xanax (insufficient). As a sleepwalker as a child. Says she always feels "uptight" but not threatened. She does admit some financial stress. Review of sleep hygiene indicates bedtime around 11:30 PM with sleep latency at least one hour and repeated waking during the night if she sleeps at all. She gets up between 4 and 5 AM. She has not been told she snores. Can never fall asleep in the daytime. Caffeine is limited to 2 soft drinks daily. Avoided alcohol and drugs herself but says both parents were alcoholics and 2 of her 3 brothers were alcoholics who took their own lives.  He is history of chronic renal insufficiency but is doing well without dialysis. ENT evaluation by Dr.Teoh because of vertigo/falls. An MRI from that evaluation showed "lesions on the brain" . She does not know a formal ENT or neurologic diagnosis. NPSG 09/18/2005 showed Epworth score 7/24, moderate obstructive sleep apnea, AHI 19.8 per hour with moderate to loud snoring, desaturation to 89% on room air and difficulty initiating sleep until 1:30 AM. She had a CPAP machine but never got comfortable with the mask which hurt her mouth. She has not used CPAP in years.FOLLOWS FOR: takes approx 1 hour to fall asleep withuse of Cloazepam and wakes up several times; when using Alprazolam 3 qhs able to sleep through the night no troubles(states Dr Parke SimmersBland is trying to get patient off of RX)  09/02/11- 69 yoF followed for obstructive sleep apnea. Since last here found Sonata did not help for sleep. Auto titration showed good CPAP compliance and pressure control at 12 using a nasal pillows mask. She  notices sleep walking and sleeping eating. She has been an occasional sleepwalker since childhood.  10/14/11-  [age] yoF never smoker followed for obstructive sleep apnea. CPAP at 9 CWP did well for her. She did have the home care company check it when she thought it wasn't working as well. She then left it at home when she traveled for a few days. During that time she came to appreciate how much it really helped. Since then she is much more comfortable with CPAP, using it all night every night. She still has trouble initiating sleep and describes lying down and trying to Will herself to sleep. I discussed sleep hygiene and her clonazepam.  12/17/11-  69 yoF never smoker followed for obstructive sleep apnea. Takes approx 1 hour to fall asleep with use of Clonazepam and wakes up several times; when using Alprazolam 3 qhs able to sleep through the night no troubles(states Dr Parke SimmersBland is trying to get patient off of RX) There was some kind of a spell associated with marked agitation after a family argument. [facility]. Dr. Parke SimmersBland is sending her to neurology for memory evaluation and wants her off of the alprazolam. Unfortunately the patient feels alprazolam worked much better than clonazepam for chronic insomnia. She denies any sense of overmedication or sedation from these meds, carrying over into daytime. She continues CPAP 9 CWP/ [employer], all night every night with no report of breakthrough snoring or unusual behavior during sleep.  02/17/12-  [age] yoF never smoker followed for obstructive  sleep apnea. We reviewed her lack of success with several meds dealing with chronic difficulty initiating and maintaining sleep. She never went to neurology as intended before by Dr Parke Simmers. Bedtime 10:30 to 11 PM. Sleep latency is much is a few hours. Wakes again within an hour or 2 every night. Xanax had helped this but Dr. Parke Simmers has tried to wean her off. Trazodone never worked at all. She continues  good compliance and control with CPAP 9/[employer], all night, every night.  Review of Systems-see HPI Constitutional:   No-   weight loss, night sweats, fevers, chills, fatigue, lassitude. HEENT:   + headaches, No-difficulty swallowing, tooth/dental problems, sore throat,       No-  sneezing, itching, ear ache, nasal congestion, post nasal drip,  CV:  No-   chest pain, orthopnea, PND, swelling in lower extremities, anasarca, Dizziness,+ palpitations Resp: No-   shortness of breath with exertion or at rest.              No-   productive cough,  No non-productive cough,  No-  coughing up of blood.              No-   change in color of mucus.  No- wheezing.   Skin: No-   rash or lesions. GI:  No-   heartburn, indigestion, abdominal pain, nausea, vomiting,  GU:  MS:  +  joint pain or swelling.  Neuro- as per HPI  Psych:  No- change in mood or affect. + depression and anxiety.  No memory loss.      Objective:   Physical Exam General- Alert, Oriented, Affect-appropriate, oriented and reasonable at this time, ., Distress- none acute.  Skin- rash-none, lesions- none, excoriation- none Lymphadenopathy- none Head- atraumatic            Eyes- Gross vision intact, PERRLA, conjunctivae clear secretions            Ears- Hearing, canals-normal            Nose- Clear, no-Septal dev, mucus, polyps, erosion, perforation             Throat- Mallampati IV , mucosa clear , drainage- none, tonsils- atrophic, dentures Neck- flexible , trachea midline, no stridor , thyroid nl, carotid no bruit Chest - symmetrical excursion , unlabored           Heart/CV- RRR , no murmur , no gallop  , no rub, nl s1 s2                           - JVD- none , edema- none, stasis changes- none, varices- none           Lung- clear to P&A, wheeze- none, cough- none , dullness-none, rub- none           Chest wall-  Abd- Br/ Gen/ Rectal- Not done, not indicated Extrem- cyanosis- none, clubbing, none, atrophy- none, strength-  nl Neuro- grossly intact to observation

## 2012-02-18 ENCOUNTER — Telehealth: Payer: Self-pay | Admitting: Internal Medicine

## 2012-02-18 DIAGNOSIS — G4700 Insomnia, unspecified: Secondary | ICD-10-CM

## 2012-02-18 NOTE — Telephone Encounter (Signed)
Pt needs her referral sent to [facility] behavorial therapy since Dr. Ledon Snare office is out of network. [facility], do we need to put in a new order. Please advise thanks

## 2012-02-18 NOTE — Telephone Encounter (Signed)
Will forward to CDY as an FYI 

## 2012-02-19 NOTE — Telephone Encounter (Signed)
Order has been placed. Please advise P[facility], thanks

## 2012-02-19 NOTE — Telephone Encounter (Signed)
Spoke with patient and she stated that she spoke with Revonda Standard at [facility] and that they needed a referral. Called and spoke with Revonda Standard and she stated that she didn't need a referral, she just needed to know what the patient needed to be seen for. I advised her what Dr. Maple Hudson wanted her to be seen for and that we were referred there by Dr. Ledon Snare. Revonda Standard placed me on hold and made sure that their psychologist see's patient's for this condition and that they do. Revonda Standard then advised me to contact the patient back and have the patient call her to schedule appointment. Relayed the above message to the patient and asked her to ask for Revonda Standard to arrange this appointment.  Per Revonda Standard there was no need to send referral. This has been documented in the referral.

## 2012-02-19 NOTE — Telephone Encounter (Signed)
I would place a new order in for [facility] in order to track referral. I documented inside of the referral to Dr. Ledon Snare, however, we need an order to referral to [facility] to that clinic. Thanks

## 2012-03-16 ENCOUNTER — Other Ambulatory Visit: Payer: Self-pay | Admitting: Nephrology

## 2012-03-16 DIAGNOSIS — N179 Acute kidney failure, unspecified: Secondary | ICD-10-CM

## 2012-03-17 ENCOUNTER — Other Ambulatory Visit: Payer: Medicare Other

## 2012-03-24 ENCOUNTER — Ambulatory Visit
Admission: RE | Admit: 2012-03-24 | Discharge: 2012-03-24 | Disposition: A | Discharge status: Home / Self Care | Payer: Medicare Other | Source: Ambulatory Visit | Attending: Nephrology | Admitting: Nephrology

## 2012-03-24 DIAGNOSIS — N179 Acute kidney failure, unspecified: Secondary | ICD-10-CM

## 2012-04-14 ENCOUNTER — Ambulatory Visit (INDEPENDENT_AMBULATORY_CARE_PROVIDER_SITE_OTHER): Payer: Medicare Other | Admitting: Internal Medicine

## 2012-04-14 ENCOUNTER — Encounter: Payer: Self-pay | Admitting: Internal Medicine

## 2012-04-14 VITALS — BP 110/78 | HR 71 | Ht 63.0 in | Wt 137.8 lb

## 2012-04-14 DIAGNOSIS — G4700 Insomnia, unspecified: Secondary | ICD-10-CM

## 2012-04-14 DIAGNOSIS — G4730 Sleep apnea, unspecified: Secondary | ICD-10-CM

## 2012-04-14 MED ORDER — CLONAZEPAM 1 MG PO TABS: ORAL_TABLET | ORAL | Status: DC

## 2012-04-14 NOTE — Progress Notes (Signed)
Patient ID: Ana FillersJudy C Foster, female    DOB: 1941-04-12, 71 y.o.   MRN: [id number] HPI 07/18/11- 71 year old female never smoker referred courtesy of Dr. Parke SimmersBland for sleep evaluation. In the last 6 years and she has had difficulty initiating and maintaining sleep. She has tried white noise Ambien(caused sleep driving), Lunesta (tried only once, not helpful), Xanax (insufficient). As a sleepwalker as a child. Says she always feels "uptight" but not threatened. She does admit some financial stress. Review of sleep hygiene indicates bedtime around 11:30 PM with sleep latency at least one hour and repeated waking during the night if she sleeps at all. She gets up between 4 and 5 AM. She has not been told she snores. Can never fall asleep in the daytime. Caffeine is limited to 2 soft drinks daily. Avoided alcohol and drugs herself but says both parents were alcoholics and 2 of her 3 brothers were alcoholics who took their own lives.  He is history of chronic renal insufficiency but is doing well without dialysis. ENT evaluation by Dr.Teoh because of vertigo/falls. An MRI from that evaluation showed "lesions on the brain" . She does not know a formal ENT or neurologic diagnosis. NPSG 09/18/2005 showed Epworth score 7/24, moderate obstructive sleep apnea, AHI 19.8 per hour with moderate to loud snoring, desaturation to 89% on room air and difficulty initiating sleep until 1:30 AM. She had a CPAP machine but never got comfortable with the mask which hurt her mouth. She has not used CPAP in years.FOLLOWS FOR: takes approx 1 hour to fall asleep withuse of Cloazepam and wakes up several times; when using Alprazolam 3 qhs able to sleep through the night no troubles(states Dr Parke SimmersBland is trying to get patient off of RX)  09/02/11- 69 yoF followed for obstructive sleep apnea. Since last here found Sonata did not help for sleep. Auto titration showed good CPAP compliance and pressure control at 12 using a nasal pillows mask. She  notices sleep walking and sleeping eating. She has been an occasional sleepwalker since childhood.  10/14/11-  [age] yoF never smoker followed for obstructive sleep apnea. CPAP at 9 CWP did well for her. She did have the home care company check it when she thought it wasn't working as well. She then left it at home when she traveled for a few days. During that time she came to appreciate how much it really helped. Since then she is much more comfortable with CPAP, using it all night every night. She still has trouble initiating sleep and describes lying down and trying to Will herself to sleep. I discussed sleep hygiene and her clonazepam.  12/17/11-  69 yoF never smoker followed for obstructive sleep apnea. Takes approx 1 hour to fall asleep with use of Clonazepam and wakes up several times; when using Alprazolam 3 qhs able to sleep through the night no troubles(states Dr Parke SimmersBland is trying to get patient off of RX) There was some kind of a spell associated with marked agitation after a family argument. She [facility]. Dr. Parke SimmersBland is sending her to neurology for memory evaluation and wants her off of the alprazolam. Unfortunately the patient feels alprazolam worked much better than clonazepam for chronic insomnia. She denies any sense of overmedication or sedation from these meds, carrying over into daytime. She continues CPAP 9 CWP/ [employer], all night every night with no report of breakthrough snoring or unusual behavior during sleep.  02/17/12-  [age] yoF never smoker followed for obstructive  sleep apnea. We reviewed her lack of success with several meds dealing with chronic difficulty initiating and maintaining sleep. She never went to neurology as intended before by Dr Parke Simmers. Bedtime 10:30 to 11 PM. Sleep latency is much is a few hours. Wakes again within an hour or 2 every night. Xanax had helped this but Dr. Parke Simmers has tried to wean her off. Trazodone never worked at all. She continues  good compliance and control with CPAP 9/[employer], all night, every night.  04/14/12-  69 yoF never smoker followed for obstructive sleep apnea. Never saw Dr [id number]($47 copay and was too much); needs something else to help sleep([facility] was $15 copay) cant afford as she is on fixed income-doesn't feel like depression is cause of not sleeping. I explained that depression wasn't thought to be the issue. I had hoped cognitive behavioral therapy could be an alternative medication management of insomnia. She is now taking melatonin but it is not helpful. She failed Benadryl and other over-the-counter sleep aids. She chose not to go to neurologist as referred by her primary physician after some kind of episode that may have been behavioral  Review of Systems-see HPI Constitutional:   No-   weight loss, night sweats, fevers, chills, fatigue, lassitude. HEENT:   + headaches, No-difficulty swallowing, tooth/dental problems, sore throat,       No-  sneezing, itching, ear ache, nasal congestion, post nasal drip,  CV:  No-   chest pain, orthopnea, PND, swelling in lower extremities, anasarca, Dizziness,+ palpitations Resp: No-   shortness of breath with exertion or at rest.              No-   productive cough,  No non-productive cough,  No-  coughing up of blood.              No-   change in color of mucus.  No- wheezing.   Skin: No-   rash or lesions. GI:  No-   heartburn, indigestion, abdominal pain, nausea, vomiting,  GU:  MS:  +  joint pain or swelling.  Neuro- as per HPI  Psych:  No- change in mood or affect. + depression and anxiety.  No memory loss.  Objective:   Physical Exam General- Alert, Oriented, Affect-appropriate, oriented and calm,  Distress- none acute.  Skin- rash-none, lesions- none, excoriation- none Lymphadenopathy- none Head- atraumatic            Eyes- Gross vision intact, PERRLA, conjunctivae clear secretions            Ears- Hearing, canals-normal            Nose- Clear, no-Septal  dev, mucus, polyps, erosion, perforation             Throat- Mallampati IV , mucosa clear , drainage- none, tonsils- atrophic, dentures Neck- flexible , trachea midline, no stridor , thyroid nl, carotid no bruit Chest - symmetrical excursion , unlabored           Heart/CV- RRR , no murmur , no gallop  , no rub, nl s1 s2                           - JVD- none , edema- none, stasis changes- none, varices- none           Lung- clear to P&A, wheeze- none, cough- none , dullness-none, rub- none           Chest wall-  Abd- Br/  Gen/ Rectal- Not done, not indicated Extrem- cyanosis- none, clubbing, none, atrophy- none, strength- nl Neuro- grossly intact to observation

## 2012-04-14 NOTE — Patient Instructions (Signed)
Script for clonazepam to try taking about an hour before sleep.

## 2012-04-18 NOTE — Assessment & Plan Note (Signed)
She continues good compliance and control with CPAP 9/ [employer] Plan-we again discussed sleep hygiene. Try clonazepam one hour before sleep

## 2012-07-01 ENCOUNTER — Encounter (HOSPITAL_COMMUNITY): Payer: Self-pay

## 2012-07-01 ENCOUNTER — Emergency Department (HOSPITAL_COMMUNITY): Payer: Medicare Other

## 2012-07-01 ENCOUNTER — Emergency Department (HOSPITAL_COMMUNITY)
Admission: EM | Admit: 2012-07-01 | Discharge: 2012-07-01 | Disposition: A | Discharge status: Home / Self Care | Payer: Medicare Other | Attending: Emergency Medicine | Admitting: Emergency Medicine

## 2012-07-01 DIAGNOSIS — W010XXA Fall on same level from slipping, tripping and stumbling without subsequent striking against object, initial encounter: Secondary | ICD-10-CM | POA: Insufficient documentation

## 2012-07-01 DIAGNOSIS — R600 Localized edema: Secondary | ICD-10-CM

## 2012-07-01 DIAGNOSIS — I498 Other specified cardiac arrhythmias: Secondary | ICD-10-CM | POA: Insufficient documentation

## 2012-07-01 DIAGNOSIS — S93609A Unspecified sprain of unspecified foot, initial encounter: Secondary | ICD-10-CM

## 2012-07-01 DIAGNOSIS — Y93K3 Activity, grooming and shearing an animal: Secondary | ICD-10-CM | POA: Insufficient documentation

## 2012-07-01 DIAGNOSIS — R609 Edema, unspecified: Secondary | ICD-10-CM | POA: Insufficient documentation

## 2012-07-01 DIAGNOSIS — I10 Essential (primary) hypertension: Secondary | ICD-10-CM | POA: Insufficient documentation

## 2012-07-01 DIAGNOSIS — N289 Disorder of kidney and ureter, unspecified: Secondary | ICD-10-CM | POA: Insufficient documentation

## 2012-07-01 DIAGNOSIS — M4120 Other idiopathic scoliosis, site unspecified: Secondary | ICD-10-CM | POA: Insufficient documentation

## 2012-07-01 DIAGNOSIS — M810 Age-related osteoporosis without current pathological fracture: Secondary | ICD-10-CM | POA: Insufficient documentation

## 2012-07-01 DIAGNOSIS — Z8249 Family history of ischemic heart disease and other diseases of the circulatory system: Secondary | ICD-10-CM | POA: Insufficient documentation

## 2012-07-01 DIAGNOSIS — Z809 Family history of malignant neoplasm, unspecified: Secondary | ICD-10-CM | POA: Insufficient documentation

## 2012-07-01 MED ORDER — HYDROCODONE-ACETAMINOPHEN 5-325 MG PO TABS
1.0000 | ORAL_TABLET | Freq: Once | ORAL | Status: AC
  Administered 2012-07-01: 1 via ORAL
  Filled 2012-07-01: qty 1

## 2012-07-01 MED ORDER — HYDROCODONE-ACETAMINOPHEN 5-325 MG PO TABS: ORAL_TABLET | ORAL | Status: AC

## 2012-07-01 NOTE — Discharge Instructions (Signed)
 Foot Sprain The muscles and cord like structures which attach muscle to bone (tendons) that surround the feet are made up of units. A foot sprain can occur at the weakest spot in any of these units. This condition is most often caused by injury to or overuse of the foot, as from playing contact sports, or aggravating a previous injury, or from poor conditioning, or obesity. SYMPTOMS  Pain with movement of the foot.   Tenderness and swelling at the injury site.   Loss of strength is present in moderate or severe sprains.  THE THREE GRADES OR SEVERITY OF FOOT SPRAIN ARE:  Mild (Grade I): Slightly pulled muscle without tearing of muscle or tendon fibers or loss of strength.   Moderate (Grade II): Tearing of fibers in a muscle, tendon, or at the attachment to bone, with small decrease in strength.   Severe (Grade III): Rupture of the muscle-tendon-bone attachment, with separation of fibers. Severe sprain requires surgical repair. Often repeating (chronic) sprains are caused by overuse. Sudden (acute) sprains are caused by direct injury or over-use.  DIAGNOSIS  Diagnosis of this condition is usually by your own observation. If problems continue, a caregiver may be required for further evaluation and treatment. X-rays may be required to make sure there are not breaks in the bones (fractures) present. Continued problems may require physical therapy for treatment. PREVENTION  Use strength and conditioning exercises appropriate for your sport.   Warm up properly prior to working out.   Use athletic shoes that are made for the sport you are participating in.   Allow adequate time for healing. Early return to activities makes repeat injury more likely, and can lead to an unstable arthritic foot that can result in prolonged disability. Mild sprains generally heal in 3 to 10 days, with moderate and severe sprains taking 2 to 10 weeks. Your caregiver can help you determine the proper time required for  healing.  HOME CARE INSTRUCTIONS   Apply ice to the injury for 15 to 20 minutes, 3 to 4 times per day. Put the ice in a plastic bag and place a towel between the bag of ice and your skin.   An elastic wrap (like an Ace bandage) may be used to keep swelling down.   Keep foot above the level of the heart, or at least raised on a footstool, when swelling and pain are present.   Try to avoid use other than gentle range of motion while the foot is painful. Do not resume use until instructed by your caregiver. Then begin use gradually, not increasing use to the point of pain. If pain does develop, decrease use and continue the above measures, gradually increasing activities that do not cause discomfort, until you gradually achieve normal use.   Use crutches if and as instructed, and for the length of time instructed.   Keep injured foot and ankle wrapped between treatments.   Massage foot and ankle for comfort and to keep swelling down. Massage from the toes up towards the knee.   Only take over-the-counter or prescription medicines for pain, discomfort, or fever as directed by your caregiver.  SEEK IMMEDIATE MEDICAL CARE IF:   Your pain and swelling increase, or pain is not controlled with medications.   You have loss of feeling in your foot or your foot turns cold or blue.   You develop new, unexplained symptoms, or an increase of the symptoms that brought you to your caregiver.  MAKE SURE YOU:  Understand these instructions.   Will watch your condition.   Will get help right away if you are not doing well or get worse.  Document Released: 04/26/2002 Document Revised: 10/24/2011 Document Reviewed: 06/23/2008 [name] Patient Information 2012 [name], [name].    Narcotic and benzodiazepine use may cause drowsiness, slowed breathing or dependence.  Please use with caution and do not drive, operate machinery or watch young children alone while taking them.  Taking combinations of these  medications or drinking alcohol will potentiate these effects.   Be sure to keep your leg elevated when at rest.  Use walker when walking until pain and swelling or improved.  Follow up with your own physician next week for a recheck.  Norco can cause constipation so use stool softener and drink plenty of water.

## 2012-07-01 NOTE — ED Notes (Signed)
Patient tolerated ace wrap and verbalized understanding of ace wrap and ice instruction with family member at bedside.

## 2012-07-01 NOTE — ED Notes (Signed)
Pt reports (R) foot pain d/t falling while trying to get out of her bed last

## 2012-07-01 NOTE — ED Provider Notes (Signed)
History   This chart was scribed for [name]. Oletta Lamas, MD by Charolett Bumpers . The patient was seen in room TR04C/TR04C. Patient's care was started at 1839.    CSN: [id number]  Arrival date & time 07/01/12  1700   First MD Initiated Contact with Patient 07/01/12 1839      Chief Complaint  Patient presents with  . Foot Pain    (Consider location/radiation/quality/duration/timing/severity/associated sxs/prior treatment) HPI [name] is a 71 y.o. female who presents to the Emergency Department complaining of constant, moderate right foot pain after falling last night. Pt reports associated swelling. Pt reports that she fell down last night, injuring her right foot while trying to give her dog a bath. Pt states that she tripped over some blankets in the floor. Pt denies any other injuries or complaints of pain at this time.    Past Medical History  Diagnosis Date  . Hypertension   . Abnormal heart rhythm   . Kidney disease   . Osteoporosis   . Scoliosis     Past Surgical History  Procedure Date  . Tubal ligation     Family History  Problem Relation Age of Onset  . Heart disease Father   . Cancer Father   . Cancer Mother     History  Substance Use Topics  . Smoking status: Never Smoker   . Smokeless tobacco: Not on file  . Alcohol Use: No    OB History    Grav Para Term Preterm Abortions TAB SAB Ect Mult Living                  Review of Systems  Constitutional: Negative for fever and chills.  Respiratory: Negative for chest tightness and shortness of breath.   Cardiovascular: Negative for chest pain.  Gastrointestinal: Negative for nausea and vomiting.  Musculoskeletal: Positive for joint swelling and arthralgias.       Right foot pain and swelling.   Skin: Negative for color change, pallor, rash and wound.  Neurological: Negative for weakness and numbness.    Allergies  Review of patient's allergies indicates no known allergies.  Home  Medications   Current Outpatient Rx  Name Route Sig Dispense Refill  . CALCITRIOL 0.25 MCG PO CAPS Oral Take 0.25 mcg by mouth daily.    [name] CLONAZEPAM 1 MG PO TABS Oral Take 1 mg by mouth at bedtime as needed. For sleep    . DILTIAZEM HCL ER 240 MG PO CP24 Oral Take 240 mg by mouth daily.    [name] DOCUSATE SODIUM 100 MG PO CAPS Oral Take 100 mg by mouth 2 (two) times daily.      [name] FERROUS FUMARATE 325 (106 FE) MG PO TABS Oral Take 1 tablet by mouth daily.    [name] HYDROCHLOROTHIAZIDE 12.5 MG PO CAPS Oral Take 12.5 mg by mouth 2 (two) times daily.      [name] LEVOTHYROXINE SODIUM 100 MCG PO TABS Oral Take 100 mcg by mouth daily.      [name] MECLIZINE HCL 25 MG PO TABS Oral Take 25 mg by mouth 2 (two) times daily.      [name] METOPROLOL TARTRATE 25 MG PO TABS Oral Take 25 mg by mouth 2 (two) times daily.      . OMEGA-3-ACID ETHYL ESTERS 1 G PO CAPS Oral Take 1 g by mouth daily.      [name] POTASSIUM CHLORIDE CRYS ER 20 MEQ PO TBCR Oral Take 20 mEq by mouth daily.      [name]  SERTRALINE HCL 100 MG PO TABS Oral Take 150 mg by mouth daily.    [name] VILAZODONE HCL 40 MG PO TABS Oral Take 40 mg by mouth daily.    [name] HYDROCODONE-ACETAMINOPHEN 5-325 MG PO TABS  1-2 tablets po q 6 hours prn moderate to severe pain 20 tablet 0    BP 121/59  Pulse 96  Temp 99.4 F (37.4 C) (Oral)  Resp 16  SpO2 99%  Physical Exam  Nursing note and vitals reviewed. Constitutional: She is oriented to person, place, and time. She appears well-developed and well-nourished. No distress.  HENT:  Head: Normocephalic and atraumatic.  Eyes: EOM are normal.  Neck: Neck supple. No tracheal deviation present.  Cardiovascular: Normal rate.   Pulmonary/Chest: Effort normal. No respiratory distress.  Musculoskeletal: She exhibits tenderness.       Right shoulder: She exhibits decreased range of motion and swelling. She exhibits no bony tenderness, no effusion, no crepitus, normal pulse and normal strength.       Tenderness to right mid foot, more on lateral  side. Diffuse swelling to right foot. Non-tender malleolus bilaterally. Non-tender 5th metatarsal. Gross sensation intact. Cap refill <2 seconds.   Neurological: She is alert and oriented to person, place, and time.  Skin: Skin is warm and dry.  Psychiatric: She has a normal mood and affect. Her behavior is normal.    ED Course  Procedures (including critical care time)  DIAGNOSTIC STUDIES: Oxygen Saturation is 99% on room air, normal by my interpretation.    COORDINATION OF CARE:  19:14-Discussed planned course of treatment with the patient, who is agreeable at this time.   19:30-Medication Orders: Hydrocodone-acetaminophen (Norco/Vicodin) 5-325 mg per tablet 1 tablet-once.   Labs Reviewed - No data to display Dg Foot Complete Right  07/01/2012  *RADIOLOGY REPORT*  Clinical Data: Status post fall.  Pain and swelling.  RIGHT FOOT COMPLETE - 3+ VIEW  Comparison: None.  Findings: There is no acute bony or joint abnormality.  Mild hallux valgus deformity and first MTP degenerative change are noted.  Soft tissues are unremarkable. Bones appear osteopenic.  IMPRESSION: No acute finding.  Original Report Authenticated By: Bernadene Bell. D'ALESSIO, M.D.     1. Foot sprain   2. Edema of foot       MDM  I personally performed the services described in this documentation, which was scribed in my presence. The recorded information has been reviewed and considered.    I reviewed p[lain film myself and discussed findings with pt and family.  RICE at home.  Pt unable to take NSAIDS due to h/o renal issues, given Norco and instructed about issues with constipation and drowsiness.     [name]. Oletta Lamas, MD 07/03/12 [id number]

## 2012-07-03 ENCOUNTER — Encounter (HOSPITAL_COMMUNITY): Payer: Self-pay | Admitting: Emergency Medicine

## 2012-10-19 ENCOUNTER — Ambulatory Visit (INDEPENDENT_AMBULATORY_CARE_PROVIDER_SITE_OTHER): Payer: Medicare Other | Admitting: Internal Medicine

## 2012-10-19 ENCOUNTER — Encounter: Payer: Self-pay | Admitting: Internal Medicine

## 2012-10-19 VITALS — BP 114/70 | HR 70 | Ht 63.0 in | Wt 139.6 lb

## 2012-10-19 DIAGNOSIS — G4700 Insomnia, unspecified: Secondary | ICD-10-CM

## 2012-10-19 MED ORDER — CLONAZEPAM 1 MG PO TABS: 1.0000 mg | ORAL_TABLET | Freq: Every evening | ORAL | Status: DC | PRN

## 2012-10-19 NOTE — Progress Notes (Signed)
Patient ID: [name], female    DOB: 1941-04-12, 71 y.o.   MRN: [id number] HPI 07/18/11- 71 year old female never smoker referred courtesy of Dr. Parke SimmersBland for sleep evaluation. In the last 6 years and she has had difficulty initiating and maintaining sleep. She has tried white noise Ambien(caused sleep driving), Lunesta (tried only once, not helpful), Xanax (insufficient). As a sleepwalker as a child. Says she always feels "uptight" but not threatened. She does admit some financial stress. Review of sleep hygiene indicates bedtime around 11:30 PM with sleep latency at least one hour and repeated waking during the night if she sleeps at all. She gets up between 4 and 5 AM. She has not been told she snores. Can never fall asleep in the daytime. Caffeine is limited to 2 soft drinks daily. Avoided alcohol and drugs herself but says both parents were alcoholics and 2 of her 3 brothers were alcoholics who took their own lives.  He is history of chronic renal insufficiency but is doing well without dialysis. ENT evaluation by Dr.Teoh because of vertigo/falls. An MRI from that evaluation showed "lesions on the brain" . She does not know a formal ENT or neurologic diagnosis. NPSG 09/18/2005 showed Epworth score 7/24, moderate obstructive sleep apnea, AHI 19.8 per hour with moderate to loud snoring, desaturation to 89% on room air and difficulty initiating sleep until 1:30 AM. She had a CPAP machine but never got comfortable with the mask which hurt her mouth. She has not used CPAP in years.FOLLOWS FOR: takes approx 1 hour to fall asleep withuse of Cloazepam and wakes up several times; when using Alprazolam 3 qhs able to sleep through the night no troubles(states Dr Parke SimmersBland is trying to get patient off of RX)  09/02/11- 69 yoF followed for obstructive sleep apnea. Since last here found Sonata did not help for sleep. Auto titration showed good CPAP compliance and pressure control at 12 using a nasal pillows mask. She  notices sleep walking and sleeping eating. She has been an occasional sleepwalker since childhood.  10/14/11-  [age] yoF never smoker followed for obstructive sleep apnea. CPAP at 9 CWP did well for her. She did have the home care company check it when she thought it wasn't working as well. She then left it at home when she traveled for a few days. During that time she came to appreciate how much it really helped. Since then she is much more comfortable with CPAP, using it all night every night. She still has trouble initiating sleep and describes lying down and trying to Will herself to sleep. I discussed sleep hygiene and her clonazepam.  12/17/11-  69 yoF never smoker followed for obstructive sleep apnea. Takes approx 1 hour to fall asleep with use of Clonazepam and wakes up several times; when using Alprazolam 3 qhs able to sleep through the night no troubles(states Dr Parke SimmersBland is trying to get patient off of RX) There was some kind of a spell associated with marked agitation after a family argument. [facility]. Dr. Parke SimmersBland is sending her to neurology for memory evaluation and wants her off of the alprazolam. Unfortunately the patient feels alprazolam worked much better than clonazepam for chronic insomnia. She denies any sense of overmedication or sedation from these meds, carrying over into daytime. She continues CPAP 9 CWP/ [facility], all night every night with no report of breakthrough snoring or unusual behavior during sleep.  02/17/12-  [age] yoF never smoker followed for obstructive  sleep apnea. We reviewed her lack of success with several meds dealing with chronic difficulty initiating and maintaining sleep. She never went to neurology as intended before by Dr Parke Simmers. Bedtime 10:30 to 11 PM. Sleep latency is much is a few hours. Wakes again within an hour or 2 every night. Xanax had helped this but Dr. Parke Simmers has tried to wean her off. Trazodone never worked at all. She continues  good compliance and control with CPAP 9/[facility], all night, every night.  04/14/12-  69 yoF never smoker followed for obstructive sleep apnea. Never saw Dr [id number]($81 copay and was too much); needs something else to help sleep([facility] was $15 copay) cant afford as she is on fixed income-doesn't feel like depression is cause of not sleeping. I explained that depression wasn't thought to be the issue. I had hoped cognitive behavioral therapy could be an alternative medication management of insomnia. She is now taking melatonin but it is not helpful. She failed Benadryl and other over-the-counter sleep aids. She chose not to go to neurologist as referred by her primary physician after some kind of episode that may have been behavioral  10/19/12-71 yoF never smoker followed for obstructive sleep apnea, insomnia FOLLOWS FOR: feels like clonazepam is helping(still wakes up around 2am to 3am) is able to return to sleep if stays still.. 1 mg clonazepam works well for sleep if she allows one hour sleep latency after taking it. Occasional waking 2 or 3 AM then can't get back to sleep CPAP 9/[facility] is worn all night every night and comfortable.  Review of Systems-see HPI Constitutional:   No-   weight loss, night sweats, fevers, chills, fatigue, lassitude. HEENT:   + headaches, No-difficulty swallowing, tooth/dental problems, sore throat,       No-  sneezing, itching, ear ache, nasal congestion, post nasal drip,  CV:  No-   chest pain, orthopnea, PND, swelling in lower extremities, anasarca, Dizziness,+ palpitations Resp: No-   shortness of breath with exertion or at rest.              No-   productive cough,  No non-productive cough,  No-  coughing up of blood.              No-   change in color of mucus.  No- wheezing.   Skin: No-   rash or lesions. GI:  No-   heartburn, indigestion, abdominal pain, nausea, vomiting,  GU:  MS:  +  joint pain or swelling.  Neuro- as per HPI  Psych:  No- change in mood or  affect. + depression and anxiety.  No memory loss.  Objective:   Physical Exam General- Alert, Oriented, Affect-appropriate, oriented and calm,  Distress- none acute. Medium build Skin- rash-none, lesions- none, excoriation- none Lymphadenopathy- none Head- atraumatic            Eyes- Gross vision intact, PERRLA, conjunctivae clear secretions            Ears- Hearing, canals-normal            Nose- Clear, no-Septal dev, mucus, polyps, erosion, perforation             Throat- Mallampati IV , mucosa clear , drainage- none, tonsils- atrophic, dentures Neck- flexible , trachea midline, no stridor , thyroid nl, carotid no bruit Chest - symmetrical excursion , unlabored           Heart/CV- RRR , no murmur , no gallop  , no rub, nl s1 s2                           -  JVD- none , edema- none, stasis changes- none, varices- none           Lung- clear to P&A, wheeze- none, cough- none , dullness-none, rub- none           Chest wall-  Abd- Br/ Gen/ Rectal- Not done, not indicated Extrem- cyanosis- none, clubbing, none, atrophy- none, strength- nl Neuro- grossly intact to observation

## 2012-10-19 NOTE — Patient Instructions (Addendum)
Refill script for clonazepam. We can refill it to last until your next appointment.  Please call as needed

## 2012-10-29 NOTE — Assessment & Plan Note (Signed)
Good control and compliance with CPAP 9. Insomnia component now managed with clonazepam which we discussed. She is tolerating it well without excessive daytime sleepiness and without developing tolerance. We have discussed this medication and potential problems. Life is better with it than without it.

## 2013-02-23 ENCOUNTER — Encounter: Payer: Self-pay | Admitting: Cardiovascular Disease

## 2013-02-23 ENCOUNTER — Encounter: Payer: Self-pay | Admitting: *Deleted

## 2013-04-01 ENCOUNTER — Ambulatory Visit (INDEPENDENT_AMBULATORY_CARE_PROVIDER_SITE_OTHER): Payer: Medicare Other | Admitting: Internal Medicine

## 2013-04-01 ENCOUNTER — Encounter: Payer: Self-pay | Admitting: Internal Medicine

## 2013-04-01 VITALS — BP 110/60 | HR 82 | Ht 62.75 in | Wt 141.8 lb

## 2013-04-01 DIAGNOSIS — G4730 Sleep apnea, unspecified: Secondary | ICD-10-CM

## 2013-04-01 MED ORDER — CLONAZEPAM 1 MG PO TABS: ORAL_TABLET | ORAL | Status: DC

## 2013-04-01 NOTE — Patient Instructions (Addendum)
Try increasing clonazepam to 1.5 or 2 of the 1 mg tabs, taken an hour before bedtime.  We need to recognize that stress is part of the sleep difficulty. Fixing the underlying stress is more helpful than drugs.

## 2013-04-01 NOTE — Progress Notes (Signed)
Patient ID: Ana FillersJudy C Pfohl, female    DOB: 1941-04-12, 72 y.o.   MRN: [id number] HPI 07/18/11- 72 year old female never smoker referred courtesy of Dr. Parke SimmersBland for sleep evaluation. In the last 6 years and she has had difficulty initiating and maintaining sleep. She has tried white noise Ambien(caused sleep driving), Lunesta (tried only once, not helpful), Xanax (insufficient). As a sleepwalker as a child. Says she always feels "uptight" but not threatened. She does admit some financial stress. Review of sleep hygiene indicates bedtime around 11:30 PM with sleep latency at least one hour and repeated waking during the night if she sleeps at all. She gets up between 4 and 5 AM. She has not been told she snores. Can never fall asleep in the daytime. Caffeine is limited to 2 soft drinks daily. Avoided alcohol and drugs herself but says both parents were alcoholics and 2 of her 3 brothers were alcoholics who took their own lives.  He is history of chronic renal insufficiency but is doing well without dialysis. ENT evaluation by Dr.Teoh because of vertigo/falls. An MRI from that evaluation showed "lesions on the brain" . She does not know a formal ENT or neurologic diagnosis. NPSG 09/18/2005 showed Epworth score 7/24, moderate obstructive sleep apnea, AHI 19.8 per hour with moderate to loud snoring, desaturation to 89% on room air and difficulty initiating sleep until 1:30 AM. She had a CPAP machine but never got comfortable with the mask which hurt her mouth. She has not used CPAP in years.FOLLOWS FOR: takes approx 1 hour to fall asleep withuse of Cloazepam and wakes up several times; when using Alprazolam 3 qhs able to sleep through the night no troubles(states Dr Parke SimmersBland is trying to get patient off of RX)  09/02/11- 69 yoF followed for obstructive sleep apnea. Since last here found Sonata did not help for sleep. Auto titration showed good CPAP compliance and pressure control at 12 using a nasal pillows mask. She  notices sleep walking and sleeping eating. She has been an occasional sleepwalker since childhood.  10/14/11-  [age] yoF never smoker followed for obstructive sleep apnea. CPAP at 9 CWP did well for her. She did have the home care company check it when she thought it wasn't working as well. She then left it at home when she traveled for a few days. During that time she came to appreciate how much it really helped. Since then she is much more comfortable with CPAP, using it all night every night. She still has trouble initiating sleep and describes lying down and trying to Will herself to sleep. I discussed sleep hygiene and her clonazepam.  12/17/11-  69 yoF never smoker followed for obstructive sleep apnea. Takes approx 1 hour to fall asleep with use of Clonazepam and wakes up several times; when using Alprazolam 3 qhs able to sleep through the night no troubles(states Dr Parke SimmersBland is trying to get patient off of RX) There was some kind of a spell associated with marked agitation after a family argument. [facility]. Dr. Parke SimmersBland is sending her to neurology for memory evaluation and wants her off of the alprazolam. Unfortunately the patient feels alprazolam worked much better than clonazepam for chronic insomnia. She denies any sense of overmedication or sedation from these meds, carrying over into daytime. She continues CPAP 9 CWP/ [facility], all night every night with no report of breakthrough snoring or unusual behavior during sleep.  02/17/12-  [age] yoF never smoker followed for obstructive  sleep apnea. We reviewed her lack of success with several meds dealing with chronic difficulty initiating and maintaining sleep. She never went to neurology as intended before by Dr Parke Simmers. Bedtime 10:30 to 11 PM. Sleep latency is much is a few hours. Wakes again within an hour or 2 every night. Xanax had helped this but Dr. Parke Simmers has tried to wean her off. Trazodone never worked at all. She continues  good compliance and control with CPAP 9/[facility], all night, every night.  04/14/12-  69 yoF never smoker followed for obstructive sleep apnea. Never saw Dr [id number]($47 copay and was too much); needs something else to help sleep([facility] was $15 copay) cant afford as she is on fixed income-doesn't feel like depression is cause of not sleeping. I explained that depression wasn't thought to be the issue. I had hoped cognitive behavioral therapy could be an alternative medication management of insomnia. She is now taking melatonin but it is not helpful. She failed Benadryl and other over-the-counter sleep aids. She chose not to go to neurologist as referred by her primary physician after some kind of episode that may have been behavioral  10/19/12-71 yoF never smoker followed for obstructive sleep apnea, insomnia FOLLOWS FOR: feels like clonazepam is helping(still wakes up around 2am to 3am) is able to return to sleep if stays still.. 1 mg clonazepam works well for sleep if she allows one hour sleep latency after taking it. Occasional waking 2 or 3 AM then can't get back to sleep CPAP 9/[facility] is worn all night every night and comfortable.  04/01/13- 71 yoF never smoker followed for obstructive sleep apnea, insomnia FOLLOWS FOR: not falling asleep as well as before and has a hard time going back to sleep when waking up in middle of the night; take 3-4 hours for clonazepam to "kick" in. Longer sleep latency. Admits increased stress related to [place] issues from ex-husband. We talked about her insomnia problems as really just an extension of this. She continues CPAP 9/[facility]  Review of Systems-see HPI Constitutional:   No-   weight loss, night sweats, fevers, chills, fatigue, lassitude. HEENT:   + headaches, No-difficulty swallowing, tooth/dental problems, sore throat,       No-  sneezing, itching, ear ache, nasal congestion, post nasal drip,  CV:  No-   chest pain, orthopnea, PND, swelling in lower  extremities, anasarca, Dizziness,+ palpitations Resp: No-   shortness of breath with exertion or at rest.              No-   productive cough,  No non-productive cough,  No-  coughing up of blood.              No-   change in color of mucus.  No- wheezing.   Skin: No-   rash or lesions. GI:  No-   heartburn, indigestion, abdominal pain, nausea, vomiting,  GU:  MS:  +  joint pain or swelling.  Neuro- as per HPI  Psych:  No- change in mood or affect. + depression and anxiety.  No memory loss.  Objective:   Physical Exam General- Alert, Oriented, Affect-appropriate, oriented and calm,  Distress- none acute. Medium build Skin- rash-none, lesions- none, excoriation- none Lymphadenopathy- none Head- atraumatic            Eyes- Gross vision intact, PERRLA, conjunctivae clear secretions            Ears- Hearing, canals-normal            Nose- Clear,  no-Septal dev, mucus, polyps, erosion, perforation             Throat- Mallampati IV , mucosa clear , drainage- none, tonsils- atrophic, dentures Neck- flexible , trachea midline, no stridor , thyroid nl, carotid no bruit Chest - symmetrical excursion , unlabored           Heart/CV- RRR , no murmur , no gallop  , no rub, nl s1 s2                           - JVD- none , edema- none, stasis changes- none, varices- none           Lung- clear to P&A, wheeze- none, cough- none , dullness-none, rub- none           Chest wall-  Abd- Br/ Gen/ Rectal- Not done, not indicated Extrem- cyanosis- none, clubbing, none, atrophy- none, strength- nl Neuro- grossly intact to observation

## 2013-04-12 ENCOUNTER — Encounter: Payer: Self-pay | Admitting: Internal Medicine

## 2013-04-12 NOTE — Assessment & Plan Note (Addendum)
Anxiety/depression with financial concerns related to alcohol and only for ex-husband. This is now directly affecting her insomnia. CPAP compliance and control remains good. Plan-increase clonazepam to 2 mg with discussion

## 2013-04-21 ENCOUNTER — Other Ambulatory Visit: Payer: Self-pay | Admitting: Internal Medicine

## 2013-04-21 MED ORDER — CLONAZEPAM 1 MG PO TABS: ORAL_TABLET | ORAL | Status: DC

## 2013-09-03 ENCOUNTER — Encounter: Payer: Self-pay | Admitting: Cardiovascular Disease

## 2013-09-06 ENCOUNTER — Other Ambulatory Visit: Payer: Self-pay | Admitting: Physician Assistant

## 2013-09-07 NOTE — Telephone Encounter (Signed)
Rx was sent to pharmacy electronically. 

## 2013-09-19 ENCOUNTER — Other Ambulatory Visit: Payer: Self-pay | Admitting: Physician Assistant

## 2013-09-20 NOTE — Telephone Encounter (Signed)
Rx was sent to pharmacy electronically. 

## 2013-10-04 ENCOUNTER — Ambulatory Visit (INDEPENDENT_AMBULATORY_CARE_PROVIDER_SITE_OTHER): Payer: Medicare Other | Admitting: Internal Medicine

## 2013-10-04 ENCOUNTER — Encounter: Payer: Self-pay | Admitting: Internal Medicine

## 2013-10-04 VITALS — BP 110/64 | HR 69 | Ht 62.75 in | Wt 133.8 lb

## 2013-10-04 DIAGNOSIS — G4700 Insomnia, unspecified: Secondary | ICD-10-CM

## 2013-10-04 MED ORDER — CLONAZEPAM 1 MG PO TABS: ORAL_TABLET | ORAL | Status: DC

## 2013-10-04 MED ORDER — ROPINIROLE HCL 0.5 MG PO TABS: ORAL_TABLET | ORAL | Status: DC

## 2013-10-04 NOTE — Patient Instructions (Signed)
Reduce clonazepam to 1 mg for sleep  Script to add Requip for Restless Legs. You can take at same time as clonazepam.  We can continue CPAP9/ [employer]  Please call as needed

## 2013-10-04 NOTE — Progress Notes (Signed)
Patient ID: [name], female    DOB: 1941-04-12, 72 y.o.   MRN: [id number] HPI 07/18/11- 72 year old female never smoker referred courtesy of Dr. Parke Foster for sleep evaluation. In the last 6 years and she has had difficulty initiating and maintaining sleep. She has tried white noise Ambien(caused sleep driving), Lunesta (tried only once, not helpful), Xanax (insufficient). As a sleepwalker as a child. Says she always feels "uptight" but not threatened. She does admit some financial stress. Review of sleep hygiene indicates bedtime around 11:30 PM with sleep latency at least one hour and repeated waking during the night if she sleeps at all. She gets up between 4 and 5 AM. She has not been told she snores. Can never fall asleep in the daytime. Caffeine is limited to 2 soft drinks daily. Avoided alcohol and drugs herself but says both parents were alcoholics and 2 of her 3 brothers were alcoholics who took their own lives.  He is history of chronic renal insufficiency but is doing well without dialysis. ENT evaluation by Dr.Teoh because of vertigo/falls. An MRI from that evaluation showed "lesions on the brain" . She does not know a formal ENT or neurologic diagnosis. NPSG 09/18/2005 showed Epworth score 7/24, moderate obstructive sleep apnea, AHI 19.8 per hour with moderate to loud snoring, desaturation to 89% on room air and difficulty initiating sleep until 1:30 AM. She had a CPAP machine but never got comfortable with the mask which hurt her mouth. She has not used CPAP in years.FOLLOWS FOR: takes approx 1 hour to fall asleep withuse of Cloazepam and wakes up several times; when using Alprazolam 3 qhs able to sleep through the night no troubles(states Dr [name] is trying to get patient off of RX)  09/02/11- 69 yoF followed for obstructive sleep apnea. Since last here found Sonata did not help for sleep. Auto titration showed good CPAP compliance and pressure control at 12 using a nasal pillows mask. She  notices sleep walking and sleeping eating. She has been an occasional sleepwalker since childhood.  10/14/11-  [age] yoF never smoker followed for obstructive sleep apnea. CPAP at 9 CWP did well for her. She did have the home care company check it when she thought it wasn't working as well. She then left it at home when she traveled for a few days. During that time she came to appreciate how much it really helped. Since then she is much more comfortable with CPAP, using it all night every night. She still has trouble initiating sleep and describes lying down and trying to Will herself to sleep. I discussed sleep hygiene and her clonazepam.  12/17/11-  69 yoF never smoker followed for obstructive sleep apnea. Takes approx 1 hour to fall asleep with use of Clonazepam and wakes up several times; when using Alprazolam 3 qhs able to sleep through the night no troubles(states Dr [name] is trying to get patient off of RX) There was some kind of a spell associated with marked agitation after a family argument. [facility]. Dr. Parke Foster is sending her to neurology for memory evaluation and wants her off of the alprazolam. Unfortunately the patient feels alprazolam worked much better than clonazepam for chronic insomnia. She denies any sense of overmedication or sedation from these meds, carrying over into daytime. She continues CPAP 9 CWP/ [facility], all night every night with no report of breakthrough snoring or unusual behavior during sleep.  02/17/12-  [age] yoF never smoker followed for obstructive  sleep apnea. We reviewed her lack of success with several meds dealing with chronic difficulty initiating and maintaining sleep. She never went to neurology as intended before by Dr Ana Simmers. Bedtime 10:30 to 11 PM. Sleep latency is much is a few hours. Wakes again within an hour or 2 every night. Xanax had helped this but Dr. Parke Simmers has tried to wean her off. Trazodone never worked at all. She continues  good compliance and control with CPAP 9/[employer], all night, every night.  04/14/12-  69 yoF never smoker followed for obstructive sleep apnea. Never saw Dr [id number]($40 copay and was too much); needs something else to help sleep([facility] was $15 copay) cant afford as she is on fixed income-doesn't feel like depression is cause of not sleeping. I explained that depression wasn't thought to be the issue. I had hoped cognitive behavioral therapy could be an alternative medication management of insomnia. She is now taking melatonin but it is not helpful. She failed Benadryl and other over-the-counter sleep aids. She chose not to go to neurologist as referred by her primary physician after some kind of episode that may have been behavioral  10/19/12-71 yoF never smoker followed for obstructive sleep apnea, insomnia FOLLOWS FOR: feels like clonazepam is helping(still wakes up around 2am to 3am) is able to return to sleep if stays still.. 1 mg clonazepam works well for sleep if she allows one hour sleep latency after taking it. Occasional waking 2 or 3 AM then can't get back to sleep CPAP 9/[employer] is worn all night every night and comfortable.  04/01/13- 71 yoF never smoker followed for obstructive sleep apnea, insomnia FOLLOWS FOR: not falling asleep as well as before and has a hard time going back to sleep when waking up in middle of the night; take 3-4 hours for clonazepam to "kick" in. Longer sleep latency. Admits increased stress related to [place]one issues from ex-husband. We talked about her insomnia problems as really just an extension of this. She continues CPAP 9/[employer]  10/04/13- 71 yoF never smoker followed for obstructive sleep apnea, insomnia FOLLOWS FOR: Has noticed she is sleep walking-finds things are out of place in the mornings-lives alone.  History of sleep walking between ages 58 and 46 years old. Leg movement in sleep disturbs  the covers and may wake her. Denies syncope, headache, numbness. Says  CPAP 9/[employer] is comfortable and not a problem.  Review of Systems-see HPI Constitutional:   No-   weight loss, night sweats, fevers, chills, fatigue, lassitude. HEENT:   + headaches, No-difficulty swallowing, tooth/dental problems, sore throat,       No-  sneezing, itching, ear ache, nasal congestion, post nasal drip,  CV:  No-   chest pain, orthopnea, PND, swelling in lower extremities, anasarca, Dizziness,+ palpitations Resp: No-   shortness of breath with exertion or at rest.              No-   productive cough,  No non-productive cough,  No-  coughing up of blood.              No-   change in color of mucus.  No- wheezing.   Skin: No-   rash or lesions. GI:  No-   heartburn, indigestion, abdominal pain, nausea, vomiting,  GU:  MS:  +  joint pain or swelling.  Neuro- as per HPI  Psych:  No- change in mood or affect. + depression and anxiety.  No memory loss.  Objective:   Physical Exam General- Alert, Oriented,  Affect-appropriate, oriented and calm,  Distress- none acute. Medium build Skin- rash-none, lesions- none, excoriation- none Lymphadenopathy- none Head- atraumatic            Eyes- Gross vision intact, PERRLA, conjunctivae clear secretions            Ears- Hearing, canals-normal            Nose- Clear, no-Septal dev, mucus, polyps, erosion, perforation             Throat- Mallampati IV , mucosa clear , drainage- none, tonsils- atrophic, dentures Neck- flexible , trachea midline, no stridor , thyroid nl, carotid no bruit Chest - symmetrical excursion , unlabored           Heart/CV- RRR , no murmur , no gallop  , no rub, nl s1 s2                           - JVD- none , edema- none, stasis changes- none, varices- none           Lung- clear to P&A, wheeze- none, cough- none , dullness-none, rub- none           Chest wall-  Abd- Br/ Gen/ Rectal- Not done, not indicated Extrem- cyanosis- none, clubbing, none, atrophy- none, strength- nl. +Cane Neuro- grossly intact to  observation

## 2013-10-20 NOTE — Assessment & Plan Note (Signed)
New concern of some sleep walking episodes while taking clonazepam 2 mg. Possible limb movement in sleep syndrome. Plan-continue CPAP 9/[employer]. Reduce clonazepam to 1 mg at bedtime and add Requip 0.5 mg.

## 2013-10-22 ENCOUNTER — Other Ambulatory Visit: Payer: Self-pay | Admitting: Internal Medicine

## 2013-10-22 NOTE — Telephone Encounter (Signed)
Ok to refill 

## 2013-10-22 NOTE — Telephone Encounter (Signed)
Please advise if okay to refill. Thanks.  

## 2013-10-22 NOTE — Telephone Encounter (Signed)
Called refill to pharmacy voicemail.  

## 2013-11-01 ENCOUNTER — Other Ambulatory Visit: Payer: Self-pay | Admitting: Physician Assistant

## 2014-03-02 ENCOUNTER — Telehealth: Payer: Self-pay | Admitting: Cardiovascular Disease

## 2014-03-07 ENCOUNTER — Other Ambulatory Visit: Payer: Self-pay | Admitting: *Deleted

## 2014-03-07 MED ORDER — DILTIAZEM HCL ER 240 MG PO CP24: 240.0000 mg | ORAL_CAPSULE | Freq: Every day | ORAL | Status: DC

## 2014-03-07 NOTE — Telephone Encounter (Signed)
Rx was sent to pharmacy electronically. 

## 2014-03-10 NOTE — Telephone Encounter (Signed)
Closed encounter °

## 2014-04-04 ENCOUNTER — Ambulatory Visit (INDEPENDENT_AMBULATORY_CARE_PROVIDER_SITE_OTHER): Payer: Medicare Other | Admitting: Internal Medicine

## 2014-04-04 ENCOUNTER — Encounter: Payer: Self-pay | Admitting: Internal Medicine

## 2014-04-04 VITALS — BP 118/72 | HR 76 | Ht 62.75 in | Wt 134.0 lb

## 2014-04-04 DIAGNOSIS — G4700 Insomnia, unspecified: Secondary | ICD-10-CM

## 2014-04-04 DIAGNOSIS — G4730 Sleep apnea, unspecified: Principal | ICD-10-CM

## 2014-04-04 NOTE — Progress Notes (Signed)
Patient ID: Ana FillersJudy C Pfohl, female    DOB: 1941-04-12, 73 y.o.   MRN: [id number] HPI 07/18/11- 73 year old female never smoker referred courtesy of Dr. Parke SimmersBland for sleep evaluation. In the last 6 years and she has had difficulty initiating and maintaining sleep. She has tried white noise Ambien(caused sleep driving), Lunesta (tried only once, not helpful), Xanax (insufficient). As a sleepwalker as a child. Says she always feels "uptight" but not threatened. She does admit some financial stress. Review of sleep hygiene indicates bedtime around 11:30 PM with sleep latency at least one hour and repeated waking during the night if she sleeps at all. She gets up between 4 and 5 AM. She has not been told she snores. Can never fall asleep in the daytime. Caffeine is limited to 2 soft drinks daily. Avoided alcohol and drugs herself but says both parents were alcoholics and 2 of her 3 brothers were alcoholics who took their own lives.  He is history of chronic renal insufficiency but is doing well without dialysis. ENT evaluation by Dr.Teoh because of vertigo/falls. An MRI from that evaluation showed "lesions on the brain" . She does not know a formal ENT or neurologic diagnosis. NPSG 09/18/2005 showed Epworth score 7/24, moderate obstructive sleep apnea, AHI 19.8 per hour with moderate to loud snoring, desaturation to 89% on room air and difficulty initiating sleep until 1:30 AM. She had a CPAP machine but never got comfortable with the mask which hurt her mouth. She has not used CPAP in years.FOLLOWS FOR: takes approx 1 hour to fall asleep withuse of Cloazepam and wakes up several times; when using Alprazolam 3 qhs able to sleep through the night no troubles(states Dr Parke SimmersBland is trying to get patient off of RX)  09/02/11- 69 yoF followed for obstructive sleep apnea. Since last here found Sonata did not help for sleep. Auto titration showed good CPAP compliance and pressure control at 12 using a nasal pillows mask. She  notices sleep walking and sleeping eating. She has been an occasional sleepwalker since childhood.  10/14/11-  [age] yoF never smoker followed for obstructive sleep apnea. CPAP at 9 CWP did well for her. She did have the home care company check it when she thought it wasn't working as well. She then left it at home when she traveled for a few days. During that time she came to appreciate how much it really helped. Since then she is much more comfortable with CPAP, using it all night every night. She still has trouble initiating sleep and describes lying down and trying to Will herself to sleep. I discussed sleep hygiene and her clonazepam.  12/17/11-  69 yoF never smoker followed for obstructive sleep apnea. Takes approx 1 hour to fall asleep with use of Clonazepam and wakes up several times; when using Alprazolam 3 qhs able to sleep through the night no troubles(states Dr Parke SimmersBland is trying to get patient off of RX) There was some kind of a spell associated with marked agitation after a family argument. [facility]. Dr. Parke SimmersBland is sending her to neurology for memory evaluation and wants her off of the alprazolam. Unfortunately the patient feels alprazolam worked much better than clonazepam for chronic insomnia. She denies any sense of overmedication or sedation from these meds, carrying over into daytime. She continues CPAP 9 CWP/ [employer], all night every night with no report of breakthrough snoring or unusual behavior during sleep.  02/17/12-  [age] yoF never smoker followed for obstructive  sleep apnea. We reviewed her lack of success with several meds dealing with chronic difficulty initiating and maintaining sleep. She never went to neurology as intended before by Dr Parke SimmersBland. Bedtime 10:30 to 11 PM. Sleep latency is much is a few hours. Wakes again within an hour or 2 every night. Xanax had helped this but Dr. Parke SimmersBland has tried to wean her off. Trazodone never worked at all. She continues  good compliance and control with CPAP 9/[employer], all night, every night.  04/14/12-  69 yoF never smoker followed for obstructive sleep apnea. Never saw Dr [id number]($60 copay and was too much); needs something else to help sleep([facility] was $15 copay) cant afford as she is on fixed income-doesn't feel like depression is cause of not sleeping. I explained that depression wasn't thought to be the issue. I had hoped cognitive behavioral therapy could be an alternative medication management of insomnia. She is now taking melatonin but it is not helpful. She failed Benadryl and other over-the-counter sleep aids. She chose not to go to neurologist as referred by her primary physician after some kind of episode that may have been behavioral  10/19/12-71 yoF never smoker followed for obstructive sleep apnea, insomnia FOLLOWS FOR: feels like clonazepam is helping(still wakes up around 2am to 3am) is able to return to sleep if stays still.. 1 mg clonazepam works well for sleep if she allows one hour sleep latency after taking it. Occasional waking 2 or 3 AM then can't get back to sleep CPAP 9/[employer] is worn all night every night and comfortable.  04/01/13- 71 yoF never smoker followed for obstructive sleep apnea, insomnia FOLLOWS FOR: not falling asleep as well as before and has a hard time going back to sleep when waking up in middle of the night; take 3-4 hours for clonazepam to "kick" in. Longer sleep latency. Admits increased stress related to Powersalimony issues from ex-husband. We talked about her insomnia problems as really just an extension of this. She continues CPAP 9/[facility]  10/04/13- 71 yoF never smoker followed for obstructive sleep apnea, insomnia FOLLOWS FOR: Has noticed she is sleep walking-finds things are out of place in the mornings-lives alone.  History of sleep walking between ages 89 and 73 years old. Leg movement in sleep disturbs  the covers and may wake her. Denies syncope, headache, numbness. Says  CPAP 9/[employer] is comfortable and not a problem.  04/04/14- 71 yoF never smoker followed for obstructive sleep apnea, insomnia,  FOLLOWS FOR: Pt wears CPAP 9 /[employer] every night for about 6 hours; DME is [employer]. Pt has been taking 2 (1mg ) tablets of Clonazepam to help with sleep. Continues to take Requip for RLS. Tolerates Lyrica w/o problem.  Review of Systems-see HPI Constitutional:   No-   weight loss, night sweats, fevers, chills, fatigue, lassitude. HEENT:   + headaches, No-difficulty swallowing, tooth/dental problems, sore throat,       No-  sneezing, itching, ear ache, nasal congestion, post nasal drip,  CV:  No-   chest pain, orthopnea, PND, swelling in lower extremities, anasarca, Dizziness,+ palpitations Resp: No-   shortness of breath with exertion or at rest.              No-   productive cough,  No non-productive cough,  No-  coughing up of blood.              No-   change in color of mucus.  No- wheezing.   Skin: No-   rash or lesions. GI:  No-   heartburn, indigestion, abdominal pain, nausea, vomiting,  GU:  MS:  +  joint pain or swelling.  Neuro- as per HPI  Psych:  No- change in mood or affect. + depression and anxiety.  No memory loss.  Objective:   Physical Exam General- Alert, Oriented, Affect-appropriate, oriented and calm,  Distress- none acute. Medium              build Skin- rash-none, lesions- none, excoriation- none Lymphadenopathy- none Head- atraumatic            Eyes- Gross vision intact, PERRLA, conjunctivae clear secretions            Ears- Hearing, canals-normal            Nose- Clear, no-Septal dev, mucus, polyps, erosion, perforation             Throat- Mallampati IV , mucosa clear , drainage- none, tonsils- atrophic, dentures Neck- flexible , trachea midline, no stridor , thyroid nl, carotid no bruit Chest - symmetrical excursion , unlabored           Heart/CV- RRR , no murmur , no gallop  , no rub, nl s1 s2                           - JVD- none ,  edema- none, stasis changes- none, varices- none           Lung- clear to P&A, wheeze- none, cough- none , dullness-none, rub- none           Chest wall-  Abd- Br/ Gen/ Rectal- Not done, not indicated Extrem- cyanosis- none, clubbing, none, atrophy- none, strength- nl. +Cane Neuro- grossly intact to observation

## 2014-04-04 NOTE — Patient Instructions (Signed)
We can continue CPAP 9/  [name]  We can continue present meds

## 2014-04-08 ENCOUNTER — Ambulatory Visit: Payer: Medicare Other | Admitting: Cardiovascular Disease

## 2014-05-20 ENCOUNTER — Other Ambulatory Visit: Payer: Self-pay | Admitting: Internal Medicine

## 2014-05-21 NOTE — Assessment & Plan Note (Signed)
Well controlled on CPAP 9 with good  compliance

## 2014-05-23 NOTE — Telephone Encounter (Signed)
CY, Please advise if okay to refill. Thanks.  

## 2014-05-23 NOTE — Telephone Encounter (Signed)
Ok to refill clonazepam and ropinerol x 6 months

## 2014-05-23 NOTE — Telephone Encounter (Signed)
Called refills to pharmacy voicemail.

## 2014-05-26 ENCOUNTER — Ambulatory Visit: Payer: Medicare Other | Admitting: Cardiovascular Disease

## 2014-06-04 ENCOUNTER — Other Ambulatory Visit: Payer: Self-pay | Admitting: Cardiovascular Disease

## 2014-06-09 NOTE — Telephone Encounter (Signed)
Patient needs to call our office to schedule an appointment for future refills. Last office visit - 03/22/2013 with Dr Ruby ColaM Croitoru. Rx was sent to pharmacy electronically.

## 2014-06-15 ENCOUNTER — Other Ambulatory Visit: Payer: Self-pay | Admitting: Cardiovascular Disease

## 2014-06-15 NOTE — Telephone Encounter (Signed)
Rx was sent to pharmacy electronically. 

## 2014-06-24 ENCOUNTER — Other Ambulatory Visit: Payer: Self-pay | Admitting: Cardiovascular Disease

## 2014-06-24 NOTE — Telephone Encounter (Signed)
Rx was sent to pharmacy electronically. 

## 2014-06-25 ENCOUNTER — Other Ambulatory Visit: Payer: Self-pay | Admitting: Cardiovascular Disease

## 2014-06-28 ENCOUNTER — Emergency Department (HOSPITAL_COMMUNITY): Payer: Medicare Other

## 2014-06-28 ENCOUNTER — Emergency Department (HOSPITAL_COMMUNITY)
Admission: EM | Admit: 2014-06-28 | Discharge: 2014-06-28 | Disposition: A | Discharge status: Home / Self Care | Payer: Medicare Other | Attending: Emergency Medicine | Admitting: Emergency Medicine

## 2014-06-28 ENCOUNTER — Encounter (HOSPITAL_COMMUNITY): Payer: Self-pay | Admitting: Emergency Medicine

## 2014-06-28 DIAGNOSIS — Z8739 Personal history of other diseases of the musculoskeletal system and connective tissue: Secondary | ICD-10-CM | POA: Diagnosis not present

## 2014-06-28 DIAGNOSIS — R112 Nausea with vomiting, unspecified: Secondary | ICD-10-CM | POA: Diagnosis not present

## 2014-06-28 DIAGNOSIS — F3289 Other specified depressive episodes: Secondary | ICD-10-CM | POA: Insufficient documentation

## 2014-06-28 DIAGNOSIS — G8929 Other chronic pain: Secondary | ICD-10-CM | POA: Diagnosis not present

## 2014-06-28 DIAGNOSIS — R631 Polydipsia: Secondary | ICD-10-CM | POA: Diagnosis not present

## 2014-06-28 DIAGNOSIS — Z79899 Other long term (current) drug therapy: Secondary | ICD-10-CM | POA: Insufficient documentation

## 2014-06-28 DIAGNOSIS — N189 Chronic kidney disease, unspecified: Secondary | ICD-10-CM

## 2014-06-28 DIAGNOSIS — R5381 Other malaise: Secondary | ICD-10-CM | POA: Insufficient documentation

## 2014-06-28 DIAGNOSIS — I129 Hypertensive chronic kidney disease with stage 1 through stage 4 chronic kidney disease, or unspecified chronic kidney disease: Secondary | ICD-10-CM | POA: Diagnosis not present

## 2014-06-28 DIAGNOSIS — J3489 Other specified disorders of nose and nasal sinuses: Secondary | ICD-10-CM | POA: Diagnosis not present

## 2014-06-28 DIAGNOSIS — F329 Major depressive disorder, single episode, unspecified: Secondary | ICD-10-CM | POA: Insufficient documentation

## 2014-06-28 DIAGNOSIS — R5383 Other fatigue: Secondary | ICD-10-CM

## 2014-06-28 DIAGNOSIS — R197 Diarrhea, unspecified: Secondary | ICD-10-CM | POA: Insufficient documentation

## 2014-06-28 DIAGNOSIS — R34 Anuria and oliguria: Secondary | ICD-10-CM

## 2014-06-28 DIAGNOSIS — R109 Unspecified abdominal pain: Secondary | ICD-10-CM | POA: Insufficient documentation

## 2014-06-28 LAB — COMPREHENSIVE METABOLIC PANEL
ALT: 13 U/L (ref 0–35)
AST: 18 U/L (ref 0–37)
Albumin: 3.8 g/dL (ref 3.5–5.2)
Alkaline Phosphatase: 74 U/L (ref 39–117)
Anion gap: 16 — ABNORMAL HIGH (ref 5–15)
BUN: 26 mg/dL — ABNORMAL HIGH (ref 6–23)
CALCIUM: 10.2 mg/dL (ref 8.4–10.5)
CO2: 23 mEq/L (ref 19–32)
CREATININE: 2.16 mg/dL — AB (ref 0.50–1.10)
Chloride: 100 mEq/L (ref 96–112)
GFR calc Af Amer: 25 mL/min — ABNORMAL LOW (ref >90)
GFR, EST NON AFRICAN AMERICAN: 22 mL/min — AB (ref >90)
Glucose, Bld: 99 mg/dL (ref 70–99)
Potassium: 3.9 mEq/L (ref 3.7–5.3)
Sodium: 139 mEq/L (ref 137–147)
Total Bilirubin: 0.6 mg/dL (ref 0.3–1.2)
Total Protein: 8 g/dL (ref 6.0–8.3)

## 2014-06-28 LAB — URINALYSIS, ROUTINE W REFLEX MICROSCOPIC
BILIRUBIN URINE: NEGATIVE
Glucose, UA: NEGATIVE mg/dL
KETONES UR: NEGATIVE mg/dL
Leukocytes, UA: NEGATIVE
Nitrite: NEGATIVE
PROTEIN: NEGATIVE mg/dL
Specific Gravity, Urine: 1.01 (ref 1.005–1.030)
UROBILINOGEN UA: 0.2 mg/dL (ref 0.0–1.0)
pH: 5 (ref 5.0–8.0)

## 2014-06-28 LAB — CBC WITH DIFFERENTIAL/PLATELET
BASOS ABS: 0 10*3/uL (ref 0.0–0.1)
BASOS PCT: 0 % (ref 0–1)
Eosinophils Absolute: 0.1 10*3/uL (ref 0.0–0.7)
Eosinophils Relative: 2 % (ref 0–5)
HCT: 39.6 % (ref 36.0–46.0)
Hemoglobin: 13.5 g/dL (ref 12.0–15.0)
LYMPHS PCT: 31 % (ref 12–46)
Lymphs Abs: 1.7 10*3/uL (ref 0.7–4.0)
MCH: 31.5 pg (ref 26.0–34.0)
MCHC: 34.1 g/dL (ref 30.0–36.0)
MCV: 92.3 fL (ref 78.0–100.0)
Monocytes Absolute: 0.7 10*3/uL (ref 0.1–1.0)
Monocytes Relative: 12 % (ref 3–12)
NEUTROS ABS: 3.1 10*3/uL (ref 1.7–7.7)
NEUTROS PCT: 55 % (ref 43–77)
PLATELETS: 157 10*3/uL (ref 150–400)
RBC: 4.29 MIL/uL (ref 3.87–5.11)
RDW: 13.9 % (ref 11.5–15.5)
WBC: 5.6 10*3/uL (ref 4.0–10.5)

## 2014-06-28 LAB — LIPASE, BLOOD: Lipase: 58 U/L (ref 11–59)

## 2014-06-28 LAB — URINE MICROSCOPIC-ADD ON

## 2014-06-28 MED ORDER — ONDANSETRON HCL 4 MG/2ML IJ SOLN
4.0000 mg | Freq: Once | INTRAMUSCULAR | Status: AC
  Administered 2014-06-28: 4 mg via INTRAVENOUS
  Filled 2014-06-28: qty 2

## 2014-06-28 MED ORDER — LORAZEPAM 1 MG PO TABS: 0.5000 mg | ORAL_TABLET | Freq: Once | ORAL | Status: DC

## 2014-06-28 MED ORDER — ONDANSETRON 4 MG PO TBDP: 4.0000 mg | ORAL_TABLET | Freq: Three times a day (TID) | ORAL | Status: DC | PRN

## 2014-06-28 MED ORDER — ONDANSETRON 4 MG PO TBDP: 4.0000 mg | ORAL_TABLET | Freq: Once | ORAL | Status: DC

## 2014-06-28 MED ORDER — SODIUM CHLORIDE 0.9 % IV BOLUS (SEPSIS)
1000.0000 mL | Freq: Once | INTRAVENOUS | Status: AC
  Administered 2014-06-28: 1000 mL via INTRAVENOUS

## 2014-06-28 NOTE — ED Notes (Addendum)
Pt left the ED with daughter, discharged by Eileen StanfordJenna, RN

## 2014-06-28 NOTE — Discharge Instructions (Signed)
You were seen today for your reduced urine output. You were able to urinate in the ED and your urine analysis was normal. Your kidney function is reduced but stable. You should call your PCP to schedule follow-up within the next week and he or she may decide whether you should see your nephrologist as well. We are giving you a prescription for nausea medication that can be taken every eight hours as needed. Please seek medical attention or return to the ED if you experience additional or worsening abdominal pain, nausea, fevers, or any other worrisome medical condition.

## 2014-06-28 NOTE — ED Notes (Signed)
She states "ive been vomiting for 10 days. My doctors aRE concerned because they've been keeping an eye on my kidneys and they dont want me to get dehydrated." she reports abd pain and some diarrhea as well

## 2014-06-28 NOTE — ED Provider Notes (Signed)
CSN: [id number]     Arrival date & time 06/28/14  1049 History   First MD Initiated Contact with Patient 06/28/14 1053     Chief Complaint  Patient presents with  . Emesis     (Consider location/radiation/quality/duration/timing/severity/associated sxs/prior Treatment) Patient is a 73 y.o. female presenting with vomiting.  Emesis Associated symptoms: diarrhea   Associated symptoms: no chills     [name] is a 74 year old woman with chronic renal insufficiency, atrial fibrillation, HTN, reactive airway disease who presents with several weeks of oliguria and malaise. She was in her usual state of health until a few weeks ago when she developed a general malaise. She also says that her urine output has decreased along with this malaise. She only produces a trick of urine and has currently not urinated today. She has no urge to urinate. She also has felt nauseous and has had non-bloody non-bilious emesis when she tries to eat. She has no abdominal pain at rest but does have diffuse abdominal tenderness. She also had some diarrhea about a week ago but has since had hard stools (no hematochezia or melena). She is currently followed by Dr. Terrial Rhodes by [facility] and PCP Dr. Parke Simmers.  Past Medical History  Diagnosis Date  . Hypertension   . Abnormal heart rhythm   . Kidney disease   . Osteoporosis   . Scoliosis   . Afib     h/o  . Reactive airway disease   . Depression   . Echocardiogram abnormal 02/26/11    MVP,mild MR,AOV mildly sclerotic. EF >55%   Past Surgical History  Procedure Laterality Date  . Tubal ligation    . Cesarean section  1967  . Cesarean section  1971  . Abdominal hysterectomy  1980   Family History  Problem Relation Age of Onset  . Heart disease Father   . Cancer Father   . Cancer Mother    History  Substance Use Topics  . Smoking status: Never Smoker   . Smokeless tobacco: Not on file  . Alcohol Use: No   OB History   Grav Para Term  Preterm Abortions TAB SAB Ect Mult Living                 Review of Systems  Constitutional: Positive for appetite change and fatigue. Negative for fever, chills and diaphoresis.  HENT: Positive for congestion.   Respiratory: Negative for shortness of breath.   Cardiovascular: Negative for chest pain and palpitations.  Gastrointestinal: Positive for nausea, vomiting and diarrhea. Negative for constipation and blood in stool.  Endocrine: Positive for polydipsia.  Genitourinary: Positive for difficulty urinating. Negative for urgency.      Allergies  Review of patient's allergies indicates no known allergies.  Home Medications   Prior to Admission medications   Medication Sig Start Date End Date Taking? Authorizing Provider  calcitRIOL (ROCALTROL) 0.25 MCG capsule Take 0.25 mcg by mouth. Mondays,Wednesdays, and Fridays   Yes Historical Provider, MD  clonazePAM (KLONOPIN) 1 MG tablet Take 2 mg by mouth at bedtime.   Yes Historical Provider, MD  diltiazem (CARDIZEM CD) 240 MG 24 hr capsule Take 1 capsule (240 mg total) by mouth daily. PATIENT NEEDS APPOINTMENT FOR FUTURE REFILLS.   Yes Ana Croitoru, MD  ferrous fumarate (HEMOCYTE - 106 MG FE) 325 (106 FE) MG TABS Take 1 tablet by mouth daily.   Yes Historical Provider, MD  hydrochlorothiazide (,MICROZIDE/HYDRODIURIL,) 12.5 MG capsule Take 12.5 mg by mouth daily.  Yes Historical Provider, MD  levothyroxine (SYNTHROID, LEVOTHROID) 112 MCG tablet Take 112 mcg by mouth daily before breakfast.   Yes Historical Provider, MD  metoprolol tartrate (LOPRESSOR) 25 MG tablet Take 0.5 tablets (12.5 mg total) by mouth 2 (two) times daily. <please make appointment> 06/24/14  Yes Ana Croitoru, MD  omega-3 acid ethyl esters (LOVAZA) 1 G capsule Take 1 g by mouth daily.     Yes Historical Provider, MD  potassium chloride SA (K-DUR,KLOR-CON) 20 MEQ tablet Take 20 mEq by mouth daily.     Yes Historical Provider, MD  sertraline (ZOLOFT) 100 MG tablet Take  150 mg by mouth daily.   Yes Historical Provider, MD  tiotropium (SPIRIVA) 18 MCG inhalation capsule Place 18 mcg into inhaler and inhale daily as needed (shortness of breath).   Yes Historical Provider, MD  Vitamin D, Ergocalciferol, (DRISDOL) 50000 UNITS CAPS capsule Take 50,000 Units by mouth every 7 (seven) days. Mondays   Yes Historical Provider, MD   BP 139/79  Pulse 130  Temp(Src) 98.3 F (36.8 C) (Oral)  Resp 17  Ht 5\' 2"  (1.575 m)  Wt 128 lb (58.06 kg)  BMI 23.41 kg/m2  SpO2 97% Physical Exam  Constitutional: She is oriented to person, place, and time. No distress.  Frail appearing  HENT:  OP clear and dry MM  Eyes: EOM are normal. Pupils are equal, round, and reactive to light. No scleral icterus.  Cardiovascular: Regular rhythm and intact distal pulses.  Exam reveals no gallop and no friction rub.   No murmur heard. tachycardic  Pulmonary/Chest: Effort normal and breath sounds normal. No respiratory distress.  Abdominal: Soft. Bowel sounds are normal. She exhibits no distension. There is no rebound and no guarding.  Diffuse tenderness to palpation in all four quadrants but not suprapubic. Questionable R CVA tenderness, none on L  Musculoskeletal: She exhibits no edema.  Neurological: She is alert and oriented to person, place, and time.  Skin: She is not diaphoretic.    ED Course  Procedures (including critical care time) Labs Review Labs Reviewed  COMPREHENSIVE METABOLIC PANEL - Abnormal; Notable for the following:    BUN 26 (*)    Creatinine, Ser 2.16 (*)    GFR calc non Af Amer 22 (*)    GFR calc Af Amer 25 (*)    Anion gap 16 (*)    All other components within normal limits  URINALYSIS, ROUTINE W REFLEX MICROSCOPIC - Abnormal; Notable for the following:    Hgb urine dipstick TRACE (*)    All other components within normal limits  CBC WITH DIFFERENTIAL  LIPASE, BLOOD  URINE MICROSCOPIC-ADD ON    Imaging Review Dg Chest 2 View  06/28/2014   CLINICAL  DATA:  Emesis, cough, diarrhea  EXAM: CHEST  2 VIEW  COMPARISON:  12/01/2010  FINDINGS: There is elevation of the right diaphragm. There is no focal parenchymal opacity, pleural effusion, or pneumothorax. The heart and mediastinal contours are unremarkable.  The osseous structures are unremarkable.  IMPRESSION: No active cardiopulmonary disease.   Electronically Signed   By: Elige Ko   On: 06/28/2014 13:16     EKG Interpretation None      MDM   Final diagnoses:  None    11:25AM: Patient has history of chronic renal insufficiency followed by Dr. Lewis Moccasin. Her oliguria is likely secondary to progression of her renal insufficiency. She is likely uremic which has caused her nausea adn general malaise/fatigue. Tachycardic and has not been able to  tolerate foods so will give 1 L NS and reassess. Follow-up CBC w diff, CMP, lipase, UA.  1:31PM: Creatinine stable at 2.16 from 3 years ago (2.07) and BUN 26. CBC, lipase wnl. Nurse concerned about wheezing/cough but patient lungs CTAB on exam and CXR no active cardiopulmonary disease. Patient remains tachycardic in 110s which is likely secondary to dehydration. Will continue IVF and will in and out cath if she cannot urinate.   2:14PM: Patient was able to void on own after feeling a strong urge to urinate. UA pending. She has a L sided headache that she says she gets when she is anxious. Will give ativan 0.5 mg po once.  3:11PM: UA negative. Patient is resting comfortably in bed. She is stable for discharge with close follow-up with her PCP. PCP may decide if nephrology visit is warranted as her creatinine is stable.   Lorenda HatchetAdam L Kiandre Spagnolo, MD 06/28/14 [id number]

## 2014-06-28 NOTE — ED Notes (Signed)
Pt's daughter will be back after 4:30p. [name] [phone]

## 2014-06-28 NOTE — ED Notes (Signed)
Pt waiting on daughter to return to ED for a ride- will wait in pod C.

## 2014-06-28 NOTE — ED Notes (Signed)
Pt coughing a lot and spitting states she has had this cough for as long as she has been sick dr bednar made aware

## 2014-06-28 NOTE — ED Notes (Signed)
Pt states that she has been sick x 3 weeks vomiting etc and she went to see dr Parke SimmersBland today and was sent for further tests .[name]. Pt states she drove herself here to hospital today  Last vomit this am and diarrhea last time yesterday

## 2014-06-29 NOTE — Telephone Encounter (Signed)
Refill refused - patient has requested too soon

## 2014-07-05 NOTE — ED Provider Notes (Signed)
I saw and evaluated the patient, reviewed the resident's note and I agree with the findings and plan.   EKG Interpretation None     Oliguria without worsening CRI.  Hurman HornJohn M Andrian Sabala, MD 07/05/14 [phone]

## 2014-08-10 ENCOUNTER — Ambulatory Visit: Payer: Medicare Other | Admitting: Cardiovascular Disease

## 2014-09-15 ENCOUNTER — Encounter: Payer: Self-pay | Admitting: Cardiovascular Disease

## 2014-09-15 ENCOUNTER — Ambulatory Visit (INDEPENDENT_AMBULATORY_CARE_PROVIDER_SITE_OTHER): Payer: Medicare Other | Admitting: Cardiovascular Disease

## 2014-09-15 VITALS — BP 94/62 | HR 63 | Resp 16 | Ht 62.0 in | Wt 128.5 lb

## 2014-09-15 DIAGNOSIS — E038 Other specified hypothyroidism: Secondary | ICD-10-CM

## 2014-09-15 DIAGNOSIS — E782 Mixed hyperlipidemia: Secondary | ICD-10-CM

## 2014-09-15 DIAGNOSIS — N183 Chronic kidney disease, stage 3 unspecified: Secondary | ICD-10-CM

## 2014-09-15 DIAGNOSIS — E034 Atrophy of thyroid (acquired): Secondary | ICD-10-CM

## 2014-09-15 DIAGNOSIS — Z79899 Other long term (current) drug therapy: Secondary | ICD-10-CM

## 2014-09-15 DIAGNOSIS — I471 Supraventricular tachycardia: Secondary | ICD-10-CM

## 2014-09-15 DIAGNOSIS — I10 Essential (primary) hypertension: Secondary | ICD-10-CM

## 2014-09-15 MED ORDER — DILTIAZEM HCL 30 MG PO TABS: 30.0000 mg | ORAL_TABLET | Freq: Three times a day (TID) | ORAL | Status: DC | PRN

## 2014-09-15 NOTE — Patient Instructions (Signed)
Dr Royann Shiversroitoru has ordered the following test(s) to be done:  Blood work - FASTING  Your physician has recommended making the following medication changes:  STOP Hydrochlorothiazide  Dr Royann Shiversroitoru wants you to follow-up in 1 year. You will receive a reminder letter in the mail one months in advance. If you don't receive a letter, please call our office to schedule the follow-up appointment.

## 2014-09-16 ENCOUNTER — Encounter: Payer: Self-pay | Admitting: Cardiovascular Disease

## 2014-09-16 ENCOUNTER — Telehealth: Payer: Self-pay | Admitting: *Deleted

## 2014-09-16 DIAGNOSIS — I10 Essential (primary) hypertension: Secondary | ICD-10-CM | POA: Insufficient documentation

## 2014-09-16 DIAGNOSIS — N183 Chronic kidney disease, stage 3 unspecified: Secondary | ICD-10-CM | POA: Insufficient documentation

## 2014-09-16 DIAGNOSIS — R5383 Other fatigue: Secondary | ICD-10-CM

## 2014-09-16 DIAGNOSIS — I471 Supraventricular tachycardia: Secondary | ICD-10-CM | POA: Insufficient documentation

## 2014-09-16 DIAGNOSIS — E039 Hypothyroidism, unspecified: Secondary | ICD-10-CM | POA: Insufficient documentation

## 2014-09-16 LAB — COMPREHENSIVE METABOLIC PANEL
ALT: 9 U/L (ref 0–35)
AST: 15 U/L (ref 0–37)
Albumin: 4.2 g/dL (ref 3.5–5.2)
Alkaline Phosphatase: 78 U/L (ref 39–117)
BUN: 33 mg/dL — ABNORMAL HIGH (ref 6–23)
CO2: 19 meq/L (ref 19–32)
CREATININE: 2.28 mg/dL — AB (ref 0.50–1.10)
Calcium: 9.6 mg/dL (ref 8.4–10.5)
Chloride: 106 mEq/L (ref 96–112)
Glucose, Bld: 88 mg/dL (ref 70–99)
Potassium: 5.3 mEq/L (ref 3.5–5.3)
SODIUM: 141 meq/L (ref 135–145)
TOTAL PROTEIN: 7.6 g/dL (ref 6.0–8.3)
Total Bilirubin: 0.4 mg/dL (ref 0.2–1.2)

## 2014-09-16 LAB — LIPID PANEL
CHOLESTEROL: 196 mg/dL (ref 0–200)
HDL: 54 mg/dL (ref >39)
LDL Cholesterol: 102 mg/dL — ABNORMAL HIGH (ref 0–99)
TRIGLYCERIDES: 202 mg/dL — AB (ref <150)
Total CHOL/HDL Ratio: 3.6 Ratio
VLDL: 40 mg/dL (ref 0–40)

## 2014-09-16 LAB — CBC
HEMATOCRIT: 37.9 % (ref 36.0–46.0)
Hemoglobin: 12.8 g/dL (ref 12.0–15.0)
MCH: 30.1 pg (ref 26.0–34.0)
MCHC: 33.8 g/dL (ref 30.0–36.0)
MCV: 89.2 fL (ref 78.0–100.0)
Platelets: 170 10*3/uL (ref 150–400)
RBC: 4.25 MIL/uL (ref 3.87–5.11)
RDW: 14.8 % (ref 11.5–15.5)
WBC: 5.4 10*3/uL (ref 4.0–10.5)

## 2014-09-16 NOTE — Progress Notes (Signed)
Patient ID: [name], female   DOB: [birth year], 73 y.o.   MRN: [id number]     Reason for office visit Hypertension, paroxysmal atrial tachycardia, chronic kidney disease  [name] She has occasional rapid palpitations which she has measured at 180 bpm. She is steadily losing weight and has a poor appetite. She has lost 8 pounds since her last appointment. She is often dizzy and has had some falls. Her blood pressure is frequently low. She denies angina dyspnea and edema.  She has normal left ventricular systolic function (echo 2012 ejection fraction greater than 55%) with normal atrial size and no serious valvular abnormalities. She had a normal nuclear stress test in 2005. She has never had coronary angiography. She has long-standing episodes of paroxysmal atrial tachycardia that were very prominent when she was receiving an excessive amount of thyroid hormone supplementation   No Known Allergies  Current Outpatient Prescriptions  Medication Sig Dispense Refill  . calcitRIOL (ROCALTROL) 0.25 MCG capsule Take 0.25 mcg by mouth. Mondays,Wednesdays, and Fridays      . clonazePAM (KLONOPIN) 1 MG tablet Take 2 mg by mouth at bedtime.      [name] diltiazem (CARDIZEM CD) 240 MG 24 hr capsule Take 1 capsule (240 mg total) by mouth daily. PATIENT NEEDS APPOINTMENT FOR FUTURE REFILLS.  30 capsule  0  . ferrous fumarate (HEMOCYTE - 106 MG FE) 325 (106 FE) MG TABS Take 1 tablet by mouth daily.      [name] levothyroxine (SYNTHROID, LEVOTHROID) 112 MCG tablet Take 112 mcg by mouth daily before breakfast.      . LYRICA 50 MG capsule       . omega-3 acid ethyl esters (LOVAZA) 1 G capsule Take 1 g by mouth daily.        . ondansetron (ZOFRAN ODT) 4 MG disintegrating tablet Take 1 tablet (4 mg total) by mouth every 8 (eight) hours as needed for nausea. 4mg  ODT q4 hours prn nausea/vomit   30 tablet  0  . potassium chloride SA (K-DUR,KLOR-CON) 20 MEQ tablet Take 20 mEq by mouth daily.        . sertraline (ZOLOFT) 100 MG tablet Take 150 mg by mouth daily.      [name] tiotropium (SPIRIVA) 18 MCG inhalation capsule Place 18 mcg into inhaler and inhale daily as needed (shortness of breath).      . Vitamin D, Ergocalciferol, (DRISDOL) 50000 UNITS CAPS capsule Take 50,000 Units by mouth every 7 (seven) days. Mondays      . diltiazem (CARDIZEM) 30 MG tablet Take 1 tablet (30 mg total) by mouth every 8 (eight) hours as needed (for sustained palpitations).  90 tablet  1   No current facility-administered medications for this visit.    Past Medical History  Diagnosis Date  . Hypertension   . Abnormal heart rhythm   . Kidney disease   . Osteoporosis   . Scoliosis   . Afib     h/o  . Reactive airway disease   . Depression   . Echocardiogram abnormal 02/26/11    MVP,mild MR,AOV mildly sclerotic. EF >55%    Past Surgical History  Procedure Laterality Date  . Tubal ligation    . Cesarean section  1967  . Cesarean section  1971  . Abdominal hysterectomy  1980    Family History  Problem Relation Age of Onset  .  Heart disease Father   . Cancer Father   . Cancer Mother     History   Social History  . Marital Status: Divorced    Spouse Name: N/A    Number of Children: 2  . Years of Education: N/A   Occupational History  . Not on file.   Social History Main Topics  . Smoking status: Never Smoker   . Smokeless tobacco: Not on file  . Alcohol Use: No  . Drug Use: No  . Sexual Activity: Not on file   Other Topics Concern  . Not on file   Social History Narrative  . No narrative on file    Review of systems: Occasional palpitations, frequent orthostatic dizziness and a few falls, no true syncope. The patient specifically denies any chest pain at rest or with exertion, dyspnea at rest or with exertion, orthopnea, paroxysmal nocturnal dyspnea, syncope,  focal  neurological deficits, intermittent claudication, lower extremity edema, unexplained weight gain, cough, hemoptysis or wheezing.  The patient also denies abdominal pain, nausea, vomiting, dysphagia, diarrhea, constipation, polyuria, polydipsia, dysuria, hematuria, frequency, urgency, abnormal bleeding or bruising, fever, chills, unexpected weight changes, mood swings, change in skin or hair texture, change in voice quality, auditory or visual problems, allergic reactions or rashes, new musculoskeletal complaints other than usual "aches and pains".   PHYSICAL EXAM BP 94/62  Pulse 63  Resp 16  Ht 5\' 2"  (1.575 m)  Wt 128 lb 8 oz (58.287 kg)  BMI 23.50 kg/m2  General: Alert, oriented x3, no distress Head: no evidence of trauma, PERRL, EOMI, no exophtalmos or lid lag, no myxedema, no xanthelasma; normal ears, nose and oropharynx Neck: normal jugular venous pulsations and no hepatojugular reflux; brisk carotid pulses without delay and no carotid bruits Chest: clear to auscultation, no signs of consolidation by percussion or palpation, normal fremitus, symmetrical and full respiratory excursions Cardiovascular: normal position and quality of the apical impulse, regular rhythm, normal first and second heart sounds, grade 1/6 systolic ejection murmur early peaking, no diastolic murmurs, rubs or gallops Abdomen: no tenderness or distention, no masses by palpation, no abnormal pulsatility or arterial bruits, normal bowel sounds, no hepatosplenomegaly Extremities: no clubbing, cyanosis or edema; 2+ radial, ulnar and brachial pulses bilaterally; 2+ right femoral, posterior tibial and dorsalis pedis pulses; 2+ left femoral, posterior tibial and dorsalis pedis pulses; no subclavian or femoral bruits Neurological: grossly nonfocal   EKG: Normal sinus rhythm with a single PAC otherwise normal  Lipid Panel  No results found for this basename: chol, trig, hdl, cholhdl, vldl, ldlcalc, ldldirect    BMET      Component Value Date/Time   NA 139 06/28/2014 1125   K 3.9 06/28/2014 1125   CL 100 06/28/2014 1125   CO2 23 06/28/2014 1125   GLUCOSE 99 06/28/2014 1125   BUN 26* 06/28/2014 1125   CREATININE 2.16* 06/28/2014 1125   CALCIUM 10.2 06/28/2014 1125   GFRNONAA 22* 06/28/2014 1125   GFRAA 25* 06/28/2014 1125     ASSESSMENT AND PLAN  [name] is troubled by palpitations that are likely due to paroxysmal atrial tachycardia previously documented. In addition to her maintenance dose of diltiazem will give her a backup immediate release diltiazem 30 mg tablet prescription to use as needed for symptoms.  Her blood pressure is quite low and she has clear-cut orthostatic hypotension. We'll discontinue her hydrochlorothiazide.   It is time to recheck lipid profile and her renal function. Also need to review her thyroid status. She  previously had mixed hyperlipidemia, primarily hypertriglyceridemia but has lost a lot of weight.  Orders Placed This Encounter  Procedures  . CBC  . Comprehensive metabolic panel  . Lipid panel   Meds ordered this encounter  Medications  . LYRICA 50 MG capsule    Sig:   . diltiazem (CARDIZEM) 30 MG tablet    Sig: Take 1 tablet (30 mg total) by mouth every 8 (eight) hours as needed (for sustained palpitations).    Dispense:  90 tablet    Refill:  1    Trisha Ken  Thurmon FairMihai Kaesen Rodriguez, MD, [facility] [phone] office [phone] pager

## 2014-09-16 NOTE — Telephone Encounter (Signed)
No answer.  Dr. Salena Saner wants a TSH drawn with other labs ordered.  [name] test and patient is not answering phone.  Order placed .

## 2014-09-19 ENCOUNTER — Telehealth: Payer: Self-pay | Admitting: *Deleted

## 2014-09-19 LAB — TSH: TSH: 1.116 u[IU]/mL (ref 0.350–4.500)

## 2014-09-19 NOTE — Telephone Encounter (Signed)
[facility] called requested TSH be added to recent lab draw.  Order will be sent through.

## 2014-09-23 NOTE — Addendum Note (Signed)
Addended by: Abram SanderSIGMON, Jaileen Janelle J on: 09/23/2014 03:16 PM   Modules accepted: Orders

## 2014-10-05 ENCOUNTER — Ambulatory Visit (INDEPENDENT_AMBULATORY_CARE_PROVIDER_SITE_OTHER): Payer: Medicare Other | Admitting: Internal Medicine

## 2014-10-05 ENCOUNTER — Encounter: Payer: Self-pay | Admitting: Internal Medicine

## 2014-10-05 VITALS — BP 114/76 | HR 87 | Ht 62.75 in | Wt 130.6 lb

## 2014-10-05 DIAGNOSIS — G4730 Sleep apnea, unspecified: Secondary | ICD-10-CM

## 2014-10-05 DIAGNOSIS — F513 Sleepwalking [somnambulism]: Secondary | ICD-10-CM

## 2014-10-05 DIAGNOSIS — G2581 Restless legs syndrome: Secondary | ICD-10-CM

## 2014-10-05 DIAGNOSIS — G4700 Insomnia, unspecified: Secondary | ICD-10-CM

## 2014-10-05 NOTE — Patient Instructions (Signed)
We can continue CPAP 9/ [facility]  We can continue the clonazepam   Try adding Tylenol PM about 15-30 minutes before bedtime to see if it is any easier to fall asleep  Please call as needed

## 2014-10-05 NOTE — Progress Notes (Signed)
Patient ID: [name], female    DOB: 1941-04-12, 73 y.o.   MRN: [id number] HPI 07/18/11- 73 year old female never smoker referred courtesy of Dr. Parke SimmersBland for sleep evaluation. In the last 6 years and she has had difficulty initiating and maintaining sleep. She has tried white noise Ambien(caused sleep driving), Lunesta (tried only once, not helpful), Xanax (insufficient). As a sleepwalker as a child. Says she always feels "uptight" but not threatened. She does admit some financial stress. Review of sleep hygiene indicates bedtime around 11:30 PM with sleep latency at least one hour and repeated waking during the night if she sleeps at all. She gets up between 4 and 5 AM. She has not been told she snores. Can never fall asleep in the daytime. Caffeine is limited to 2 soft drinks daily. Avoided alcohol and drugs herself but says both parents were alcoholics and 2 of her 3 brothers were alcoholics who took their own lives.  He is history of chronic renal insufficiency but is doing well without dialysis. ENT evaluation by Dr.Teoh because of vertigo/falls. An MRI from that evaluation showed "lesions on the brain" . She does not know a formal ENT or neurologic diagnosis. NPSG 09/18/2005 showed Epworth score 7/24, moderate obstructive sleep apnea, AHI 19.8 per hour with moderate to loud snoring, desaturation to 89% on room air and difficulty initiating sleep until 1:30 AM. She had a CPAP machine but never got comfortable with the mask which hurt her mouth. She has not used CPAP in years.FOLLOWS FOR: takes approx 1 hour to fall asleep withuse of Cloazepam and wakes up several times; when using Alprazolam 3 qhs able to sleep through the night no troubles(states Dr Parke SimmersBland is trying to get patient off of RX)  09/02/11- 69 yoF followed for obstructive sleep apnea. Since last here found Sonata did not help for sleep. Auto titration showed good CPAP compliance and pressure control at 12 using a nasal pillows mask. She  notices sleep walking and sleeping eating. She has been an occasional sleepwalker since childhood.  10/14/11-  [age] yoF never smoker followed for obstructive sleep apnea. CPAP at 9 CWP did well for her. She did have the home care company check it when she thought it wasn't working as well. She then left it at home when she traveled for a few days. During that time she came to appreciate how much it really helped. Since then she is much more comfortable with CPAP, using it all night every night. She still has trouble initiating sleep and describes lying down and trying to Will herself to sleep. I discussed sleep hygiene and her clonazepam.  12/17/11-  69 yoF never smoker followed for obstructive sleep apnea. Takes approx 1 hour to fall asleep with use of Clonazepam and wakes up several times; when using Alprazolam 3 qhs able to sleep through the night no troubles(states Dr Parke SimmersBland is trying to get patient off of RX) There was some kind of a spell associated with marked agitation after a family argument. [facility]. Dr. Parke SimmersBland is sending her to neurology for memory evaluation and wants her off of the alprazolam. Unfortunately the patient feels alprazolam worked much better than clonazepam for chronic insomnia. She denies any sense of overmedication or sedation from these meds, carrying over into daytime. She continues CPAP 9 CWP/ [facility], all night every night with no report of breakthrough snoring or unusual behavior during sleep.  02/17/12-  [age] yoF never smoker followed for obstructive  sleep apnea. We reviewed her lack of success with several meds dealing with chronic difficulty initiating and maintaining sleep. She never went to neurology as intended before by Dr Parke SimmersBland. Bedtime 10:30 to 11 PM. Sleep latency is much is a few hours. Wakes again within an hour or 2 every night. Xanax had helped this but Dr. Parke SimmersBland has tried to wean her off. Trazodone never worked at all. She continues  good compliance and control with CPAP 9/[facility], all night, every night.  04/14/12-  69 yoF never smoker followed for obstructive sleep apnea. Never saw Dr [id number]($95cKnight($60 copay and was too much); needs something else to help sleep([facility] was $15 copay) cant afford as she is on fixed income-doesn't feel like depression is cause of not sleeping. I explained that depression wasn't thought to be the issue. I had hoped cognitive behavioral therapy could be an alternative medication management of insomnia. She is now taking melatonin but it is not helpful. She failed Benadryl and other over-the-counter sleep aids. She chose not to go to neurologist as referred by her primary physician after some kind of episode that may have been behavioral  10/19/12-71 yoF never smoker followed for obstructive sleep apnea, insomnia FOLLOWS FOR: feels like clonazepam is helping(still wakes up around 2am to 3am) is able to return to sleep if stays still.. 1 mg clonazepam works well for sleep if she allows one hour sleep latency after taking it. Occasional waking 2 or 3 AM then can't get back to sleep CPAP 9/[facility] is worn all night every night and comfortable.  04/01/13- 71 yoF never smoker followed for obstructive sleep apnea, insomnia FOLLOWS FOR: not falling asleep as well as before and has a hard time going back to sleep when waking up in middle of the night; take 3-4 hours for clonazepam to "kick" in. Longer sleep latency. Admits increased stress related to [place] issues from ex-husband. We talked about her insomnia problems as really just an extension of this. She continues CPAP 9/[facility]  10/04/13- 71 yoF never smoker followed for obstructive sleep apnea, insomnia FOLLOWS FOR: Has noticed she is sleep walking-finds things are out of place in the mornings-lives alone.  History of sleep walking between ages 89 and 73 years old. Leg movement in sleep disturbs  the covers and may wake her. Denies syncope, headache, numbness. Says  CPAP 9/[employer] is comfortable and not a problem.  04/04/14- 71 yoF never smoker followed for obstructive sleep apnea, insomnia,  FOLLOWS FOR: Pt wears CPAP 9 /[facility] every night for about 6 hours; DME is [facility]. Pt has been taking 2 (1mg ) tablets of Clonazepam to help with sleep. Continues to take Requip for RLS. Tolerates Lyrica w/o problem.  10/05/14- 72 yoF never smoker followed for obstructive sleep apnea, insomnia, Restless Legs FOLLOWS FOR: Wears CPAP 9/[facility] every night through [facility]-will need to get DL and enroll in [facility]. Had flu vax Always insomnia- clonazepam helps.  Life-long sleep walker. No injury. Requip helps restless legs  Review of Systems-see HPI Constitutional:   No-   weight loss, night sweats, fevers, chills, fatigue, lassitude. HEENT:   + headaches, No-difficulty swallowing, tooth/dental problems, sore throat,       No-  sneezing, itching, ear ache, nasal congestion, post nasal drip,  CV:  No-   chest pain, orthopnea, PND, swelling in lower extremities, anasarca, Dizziness,+ palpitations Resp: No-   shortness of breath with exertion or at rest.              No-  productive cough,  No non-productive cough,  No-  coughing up of blood.              No-   change in color of mucus.  No- wheezing.   Skin: No-   rash or lesions. GI:  No-   heartburn, indigestion, abdominal pain, nausea, vomiting,  GU:  MS:  +  joint pain or swelling.  Neuro- as per HPI  Psych:  No- change in mood or affect. + depression and anxiety.  No memory loss.  Objective:   Physical Exam General- Alert, Oriented, Affect-appropriate, oriented and calm,  Distress- none acute. Medium              build Skin- rash-none, lesions- none, excoriation- none Lymphadenopathy- none Head- atraumatic            Eyes- Gross vision intact, PERRLA, conjunctivae clear secretions            Ears- Hearing, canals-normal            Nose- Clear, no-Septal dev, mucus, polyps, erosion, perforation             Throat-  Mallampati IV , mucosa clear , drainage- none, tonsils- atrophic, dentures Neck- flexible , trachea midline, no stridor , thyroid nl, carotid no bruit Chest - symmetrical excursion , unlabored           Heart/CV- RRR , no murmur , no gallop  , no rub, nl s1 s2                           - JVD- none , edema- none, stasis changes- none, varices- none           Lung- clear to P&A, wheeze- none, cough- none , dullness-none, rub- none           Chest wall-  Abd- Br/ Gen/ Rectal- Not done, not indicated Extrem- cyanosis- none, clubbing, none, atrophy- none, strength- nl. +Cane Neuro- grossly intact to observation

## 2014-10-08 DIAGNOSIS — F513 Sleepwalking [somnambulism]: Secondary | ICD-10-CM | POA: Insufficient documentation

## 2014-10-08 DIAGNOSIS — G2581 Restless legs syndrome: Secondary | ICD-10-CM | POA: Insufficient documentation

## 2014-10-08 NOTE — Assessment & Plan Note (Signed)
Plan- watch impact of clonazepam and requip. Emphasize safe bedroom

## 2014-10-08 NOTE — Assessment & Plan Note (Signed)
Requip and Clonazepam seem to help

## 2014-10-08 NOTE — Assessment & Plan Note (Signed)
Clonazepam helps  Plan- emphasized good sleep hygiene. Can try adding Tylenol PM occasionally at bedtime.

## 2014-10-19 ENCOUNTER — Other Ambulatory Visit: Payer: Self-pay | Admitting: Cardiovascular Disease

## 2014-11-12 ENCOUNTER — Emergency Department (HOSPITAL_COMMUNITY)
Admission: EM | Admit: 2014-11-12 | Discharge: 2014-11-12 | Disposition: A | Discharge status: Home / Self Care | Payer: Medicare Other | Attending: Emergency Medicine | Admitting: Emergency Medicine

## 2014-11-12 ENCOUNTER — Emergency Department (HOSPITAL_COMMUNITY): Payer: Medicare Other

## 2014-11-12 ENCOUNTER — Encounter (HOSPITAL_COMMUNITY): Payer: Self-pay | Admitting: Emergency Medicine

## 2014-11-12 DIAGNOSIS — W01198A Fall on same level from slipping, tripping and stumbling with subsequent striking against other object, initial encounter: Secondary | ICD-10-CM | POA: Insufficient documentation

## 2014-11-12 DIAGNOSIS — S0990XA Unspecified injury of head, initial encounter: Secondary | ICD-10-CM | POA: Diagnosis not present

## 2014-11-12 DIAGNOSIS — I4891 Unspecified atrial fibrillation: Secondary | ICD-10-CM | POA: Insufficient documentation

## 2014-11-12 DIAGNOSIS — M810 Age-related osteoporosis without current pathological fracture: Secondary | ICD-10-CM | POA: Insufficient documentation

## 2014-11-12 DIAGNOSIS — F329 Major depressive disorder, single episode, unspecified: Secondary | ICD-10-CM | POA: Insufficient documentation

## 2014-11-12 DIAGNOSIS — J45909 Unspecified asthma, uncomplicated: Secondary | ICD-10-CM | POA: Insufficient documentation

## 2014-11-12 DIAGNOSIS — W19XXXA Unspecified fall, initial encounter: Secondary | ICD-10-CM

## 2014-11-12 DIAGNOSIS — S40811A Abrasion of right upper arm, initial encounter: Secondary | ICD-10-CM | POA: Diagnosis not present

## 2014-11-12 DIAGNOSIS — Y92009 Unspecified place in unspecified non-institutional (private) residence as the place of occurrence of the external cause: Secondary | ICD-10-CM | POA: Diagnosis not present

## 2014-11-12 DIAGNOSIS — I10 Essential (primary) hypertension: Secondary | ICD-10-CM | POA: Insufficient documentation

## 2014-11-12 DIAGNOSIS — Z87448 Personal history of other diseases of urinary system: Secondary | ICD-10-CM | POA: Insufficient documentation

## 2014-11-12 DIAGNOSIS — Y998 Other external cause status: Secondary | ICD-10-CM | POA: Insufficient documentation

## 2014-11-12 DIAGNOSIS — Y9389 Activity, other specified: Secondary | ICD-10-CM | POA: Diagnosis not present

## 2014-11-12 DIAGNOSIS — Z79899 Other long term (current) drug therapy: Secondary | ICD-10-CM | POA: Diagnosis not present

## 2014-11-12 LAB — BASIC METABOLIC PANEL
Anion gap: 8 (ref 5–15)
BUN: 38 mg/dL — ABNORMAL HIGH (ref 6–23)
CO2: 23 mmol/L (ref 19–32)
Calcium: 9.7 mg/dL (ref 8.4–10.5)
Chloride: 111 mEq/L (ref 96–112)
Creatinine, Ser: 2.23 mg/dL — ABNORMAL HIGH (ref 0.50–1.10)
GFR calc Af Amer: 24 mL/min — ABNORMAL LOW (ref >90)
GFR calc non Af Amer: 21 mL/min — ABNORMAL LOW (ref >90)
Glucose, Bld: 98 mg/dL (ref 70–99)
Potassium: 4.6 mmol/L (ref 3.5–5.1)
Sodium: 142 mmol/L (ref 135–145)

## 2014-11-12 LAB — URINALYSIS, ROUTINE W REFLEX MICROSCOPIC
Bilirubin Urine: NEGATIVE
Glucose, UA: NEGATIVE mg/dL
Hgb urine dipstick: NEGATIVE
Ketones, ur: NEGATIVE mg/dL
Leukocytes, UA: NEGATIVE
Nitrite: NEGATIVE
Protein, ur: NEGATIVE mg/dL
Specific Gravity, Urine: 1.016 (ref 1.005–1.030)
Urobilinogen, UA: 0.2 mg/dL (ref 0.0–1.0)
pH: 5.5 (ref 5.0–8.0)

## 2014-11-12 LAB — CBC WITH DIFFERENTIAL/PLATELET
Basophils Absolute: 0 10*3/uL (ref 0.0–0.1)
Basophils Relative: 0 % (ref 0–1)
Eosinophils Absolute: 0.1 10*3/uL (ref 0.0–0.7)
Eosinophils Relative: 1 % (ref 0–5)
HCT: 36 % (ref 36.0–46.0)
Hemoglobin: 11.7 g/dL — ABNORMAL LOW (ref 12.0–15.0)
Lymphocytes Relative: 23 % (ref 12–46)
Lymphs Abs: 1.6 10*3/uL (ref 0.7–4.0)
MCH: 31 pg (ref 26.0–34.0)
MCHC: 32.5 g/dL (ref 30.0–36.0)
MCV: 95.5 fL (ref 78.0–100.0)
Monocytes Absolute: 0.6 10*3/uL (ref 0.1–1.0)
Monocytes Relative: 9 % (ref 3–12)
Neutro Abs: 4.6 10*3/uL (ref 1.7–7.7)
Neutrophils Relative %: 67 % (ref 43–77)
Platelets: 163 10*3/uL (ref 150–400)
RBC: 3.77 MIL/uL — ABNORMAL LOW (ref 3.87–5.11)
RDW: 14.9 % (ref 11.5–15.5)
WBC: 6.9 10*3/uL (ref 4.0–10.5)

## 2014-11-12 MED ORDER — OXYCODONE-ACETAMINOPHEN 5-325 MG PO TABS
2.0000 | ORAL_TABLET | Freq: Once | ORAL | Status: AC
  Administered 2014-11-12: 2 via ORAL
  Filled 2014-11-12: qty 2

## 2014-11-12 NOTE — Discharge Instructions (Signed)

## 2014-11-12 NOTE — ED Notes (Signed)
Bed: [facility]2 Expected date:  Expected time:  Means of arrival:  Comments: hold 

## 2014-11-12 NOTE — ED Notes (Signed)
Per EMS pt comes from home for fall after tripping on her rug when she came in door from outside.  Pt hit her head on door then again on the floor when she fell.  Pt has increased falls this week.  Pt has small abrasion on right arm.  Pt had headachethat was subsiding in route.

## 2014-11-18 ENCOUNTER — Other Ambulatory Visit: Payer: Self-pay | Admitting: Cardiovascular Disease

## 2014-11-19 NOTE — ED Provider Notes (Signed)
CSN: [id number]     Arrival date & time 11/12/14  1803 History   First MD Initiated Contact with Patient 11/12/14 1946     Chief Complaint  Patient presents with  . Fall  . Headache  . Abrasion    right arm     (Consider location/radiation/quality/duration/timing/severity/associated sxs/prior Treatment) HPI   74 year old female presenting after a fall. Patient tripped at home shortly before arrival. She hit her head against the door that on the floor. No loss of consciousness. Mild headache which has progressively improved. Denies any stiff neck or back pain. No acute visual changes. No nausea or vomiting. Small abrasion to her right arm but denies any significant pain there. No acute numbness, tingling or loss of strength. Has been in auditory since this fall without any difficulty. No blood thinners.  Past Medical History  Diagnosis Date  . Hypertension   . Abnormal heart rhythm   . Kidney disease   . Osteoporosis   . Scoliosis   . Afib     h/o  . Reactive airway disease   . Depression   . Echocardiogram abnormal 02/26/11    MVP,mild MR,AOV mildly sclerotic. EF >55%   Past Surgical History  Procedure Laterality Date  . Tubal ligation    . Cesarean section  1967  . Cesarean section  1971  . Abdominal hysterectomy  1980   Family History  Problem Relation Age of Onset  . Heart disease Father   . Cancer Father   . Cancer Mother    History  Substance Use Topics  . Smoking status: Never Smoker   . Smokeless tobacco: Not on file  . Alcohol Use: No   OB History    No data available     Review of Systems  All systems reviewed and negative, other than as noted in HPI.   Allergies  Review of patient's allergies indicates no known allergies.  Home Medications   Prior to Admission medications   Medication Sig Start Date End Date Taking? Authorizing Provider  calcitRIOL (ROCALTROL) 0.25 MCG capsule Take 0.25 mcg by mouth. Mondays,Wednesdays, and Fridays   Yes  Historical Provider, MD  clonazePAM (KLONOPIN) 1 MG tablet Take 2 mg by mouth at bedtime.   Yes Historical Provider, MD  diltiazem (CARDIZEM) 30 MG tablet Take 1 tablet (30 mg total) by mouth every 8 (eight) hours as needed (for sustained palpitations). 09/15/14  Yes Mihai Croitoru, MD  ferrous fumarate (HEMOCYTE - 106 MG FE) 325 (106 FE) MG TABS Take 1 tablet by mouth daily.   Yes Historical Provider, MD  levothyroxine (SYNTHROID, LEVOTHROID) 112 MCG tablet Take 112 mcg by mouth daily before breakfast.   Yes Historical Provider, MD  LYRICA 50 MG capsule Take 50 mg by mouth 2 (two) times daily as needed (pain).  08/24/14  Yes Historical Provider, MD  omega-3 acid ethyl esters (LOVAZA) 1 G capsule Take 1 g by mouth daily.     Yes Historical Provider, MD  potassium chloride SA (K-DUR,KLOR-CON) 20 MEQ tablet Take 20 mEq by mouth daily.     Yes Historical Provider, MD  sertraline (ZOLOFT) 100 MG tablet Take 150 mg by mouth daily.   Yes Historical Provider, MD  Vitamin D, Ergocalciferol, (DRISDOL) 50000 UNITS CAPS capsule Take 50,000 Units by mouth every 7 (seven) days. Mondays   Yes Historical Provider, MD  Budesonide-Formoterol Fumarate (SYMBICORT IN) Inhale 1-2 puffs into the lungs as needed (shortness of breath).    Historical Provider, MD  ondansetron (  ZOFRAN ODT) 4 MG disintegrating tablet Take 1 tablet (4 mg total) by mouth every 8 (eight) hours as needed for nausea.  ODT q4 hours prn nausea/vomit Patient not taking: Reported on 11/12/2014 06/28/14   [name], MD  rOPINIRole (REQUIP) 0.5 MG tablet Take 1 tablet by mouth 2 (two) times daily as needed (restless leg).  11/10/14   Historical Provider, MD   BP 138/61 mmHg  Pulse 69  Temp(Src) 99.5 F (37.5 C) (Oral)  Resp 18  SpO2 95% Physical Exam  Constitutional: She appears well-developed and well-nourished. No distress.  HENT:  Head: Normocephalic and atraumatic.  Eyes: Conjunctivae are normal. Pupils are equal, round, and reactive  to light. Right eye exhibits no discharge. Left eye exhibits no discharge.  Neck: Neck supple.  Cardiovascular: Normal rate, regular rhythm and normal heart sounds.  Exam reveals no gallop and no friction rub.   No murmur heard. Pulmonary/Chest: Effort normal and breath sounds normal. No respiratory distress.  Abdominal: Soft. She exhibits no distension. There is no tenderness.  Musculoskeletal: She exhibits no edema or tenderness.  No midline spinal tenderness. No apparent pain with range of motion large joints or bony tenderness with palpation of extremities.  Neurological: She is alert.  Skin: Skin is warm and dry.  Psychiatric: She has a normal mood and affect. Her behavior is normal. Thought content normal.  Nursing note and vitals reviewed.    All systems reviewed and negative, other than as noted in HPI.   ED Course  Procedures (including critical care time) Labs Review Labs Reviewed  CBC WITH DIFFERENTIAL - Abnormal; Notable for the following:    RBC 3.77 (*)    Hemoglobin 11.7 (*)    All other components within normal limits  BASIC METABOLIC PANEL - Abnormal; Notable for the following:    BUN 38 (*)    Creatinine, Ser 2.23 (*)    GFR calc non Af Amer 21 (*)    GFR calc Af Amer 24 (*)    All other components within normal limits  URINALYSIS, ROUTINE W REFLEX MICROSCOPIC    Imaging Review No results found.   EKG Interpretation   Date/Time:  Saturday November 12 2014 20:47:24 EST Ventricular Rate:  83 PR Interval:  176 QRS Duration: 82 QT Interval:  370 QTC Calculation: 435 R Axis:   43 Text Interpretation:  Sinus rhythm Abnormal R-wave progression, early  transition ED PHYSICIAN INTERPRETATION AVAILABLE IN [facility]  Confirmed by TEST, Record ([id number]) on 11/14/2014 8:32:26 AM      MDM   Final diagnoses:  Fall  Closed head injury, initial encounter    74 year old female with closed head injury. Nonfocal neurological examination. At her reported  baseline. Imaging without any acute abnormality. Low suspicion for serious traumatic injury. Return precautions were discussed. Outpatient follow-up as needed otherwise.    Raeford Razor, MD 11/19/14 [phone]

## 2014-11-21 ENCOUNTER — Other Ambulatory Visit: Payer: Self-pay | Admitting: Internal Medicine

## 2014-11-21 NOTE — Telephone Encounter (Signed)
Rx(s) sent to pharmacy electronically.  

## 2014-11-22 ENCOUNTER — Other Ambulatory Visit: Payer: Self-pay | Admitting: Internal Medicine

## 2014-11-23 NOTE — Telephone Encounter (Signed)
Ok to refill for total 6 months 

## 2014-11-23 NOTE — Telephone Encounter (Signed)
CY please advise on refill. Thanks. 

## 2014-11-24 NOTE — Telephone Encounter (Signed)
Ok to refill 

## 2014-11-24 NOTE — Telephone Encounter (Signed)
CY please advise on refill. Thanks. 

## 2014-11-25 NOTE — Telephone Encounter (Signed)
Called to pharmacy voicemail. 

## 2014-11-28 DIAGNOSIS — M4120 Other idiopathic scoliosis, site unspecified: Secondary | ICD-10-CM | POA: Diagnosis not present

## 2014-11-28 DIAGNOSIS — M545 Low back pain: Secondary | ICD-10-CM | POA: Diagnosis not present

## 2014-11-30 DIAGNOSIS — N183 Chronic kidney disease, stage 3 (moderate): Secondary | ICD-10-CM | POA: Diagnosis not present

## 2014-11-30 DIAGNOSIS — N2581 Secondary hyperparathyroidism of renal origin: Secondary | ICD-10-CM | POA: Diagnosis not present

## 2014-11-30 DIAGNOSIS — D638 Anemia in other chronic diseases classified elsewhere: Secondary | ICD-10-CM | POA: Diagnosis not present

## 2014-11-30 DIAGNOSIS — I129 Hypertensive chronic kidney disease with stage 1 through stage 4 chronic kidney disease, or unspecified chronic kidney disease: Secondary | ICD-10-CM | POA: Diagnosis not present

## 2014-12-07 DIAGNOSIS — M1250 Traumatic arthropathy, unspecified site: Secondary | ICD-10-CM | POA: Diagnosis not present

## 2014-12-07 DIAGNOSIS — I2510 Atherosclerotic heart disease of native coronary artery without angina pectoris: Secondary | ICD-10-CM | POA: Diagnosis not present

## 2014-12-07 DIAGNOSIS — M4126 Other idiopathic scoliosis, lumbar region: Secondary | ICD-10-CM | POA: Diagnosis not present

## 2014-12-07 DIAGNOSIS — N189 Chronic kidney disease, unspecified: Secondary | ICD-10-CM | POA: Diagnosis not present

## 2014-12-14 ENCOUNTER — Telehealth: Payer: Self-pay | Admitting: Cardiovascular Disease

## 2014-12-14 ENCOUNTER — Other Ambulatory Visit: Payer: Self-pay | Admitting: Cardiovascular Disease

## 2014-12-14 NOTE — Telephone Encounter (Signed)
Per OV 09/15/14 [name] is troubled by palpitations that are likely due to paroxysmal atrial tachycardia previously documented. In addition to her maintenance dose of diltiazem will give her a backup immediate release diltiazem 30 mg tablet prescription to use as needed for symptoms.  Informed patient of this. She voiced understanding

## 2014-12-14 NOTE — Telephone Encounter (Signed)
Rx(s) sent to pharmacy electronically.  

## 2014-12-14 NOTE — Telephone Encounter (Signed)
Pt c/o medication issue:  1. Name of Medication:Diltiazem 30mg   2. How are you currently taking this medication (dosage and times per day)? Once daily    3. Are you having a reaction (difficulty breathing--STAT)? No   4. What is your medication issue? Have been taking 240 mg  And then Dr. Royann Shiversroitoru gave her 30 mg , not sure if she is to stop the 240 mg and just take the 30mg 

## 2014-12-19 ENCOUNTER — Other Ambulatory Visit: Payer: Self-pay | Admitting: Cardiovascular Disease

## 2014-12-19 DIAGNOSIS — G4733 Obstructive sleep apnea (adult) (pediatric): Secondary | ICD-10-CM | POA: Diagnosis not present

## 2014-12-21 ENCOUNTER — Other Ambulatory Visit: Payer: Self-pay | Admitting: Internal Medicine

## 2014-12-22 DIAGNOSIS — Z0000 Encounter for general adult medical examination without abnormal findings: Secondary | ICD-10-CM | POA: Diagnosis not present

## 2014-12-22 DIAGNOSIS — I10 Essential (primary) hypertension: Secondary | ICD-10-CM | POA: Diagnosis not present

## 2014-12-22 NOTE — Telephone Encounter (Signed)
Ok to refill 

## 2014-12-22 NOTE — Telephone Encounter (Signed)
CY Please advise on refill. Thanks.  

## 2014-12-26 ENCOUNTER — Emergency Department (HOSPITAL_COMMUNITY)
Admission: EM | Admit: 2014-12-26 | Discharge: 2014-12-27 | Disposition: A | Discharge status: Home / Self Care | Payer: Medicare Other | Attending: Emergency Medicine | Admitting: Emergency Medicine

## 2014-12-26 ENCOUNTER — Encounter (HOSPITAL_COMMUNITY): Payer: Self-pay | Admitting: Emergency Medicine

## 2014-12-26 DIAGNOSIS — R011 Cardiac murmur, unspecified: Secondary | ICD-10-CM | POA: Insufficient documentation

## 2014-12-26 DIAGNOSIS — N289 Disorder of kidney and ureter, unspecified: Secondary | ICD-10-CM | POA: Insufficient documentation

## 2014-12-26 DIAGNOSIS — I10 Essential (primary) hypertension: Secondary | ICD-10-CM | POA: Insufficient documentation

## 2014-12-26 DIAGNOSIS — Y9389 Activity, other specified: Secondary | ICD-10-CM | POA: Diagnosis not present

## 2014-12-26 DIAGNOSIS — Z79899 Other long term (current) drug therapy: Secondary | ICD-10-CM | POA: Diagnosis not present

## 2014-12-26 DIAGNOSIS — R404 Transient alteration of awareness: Secondary | ICD-10-CM | POA: Diagnosis not present

## 2014-12-26 DIAGNOSIS — Y998 Other external cause status: Secondary | ICD-10-CM | POA: Insufficient documentation

## 2014-12-26 DIAGNOSIS — W1809XA Striking against other object with subsequent fall, initial encounter: Secondary | ICD-10-CM | POA: Diagnosis not present

## 2014-12-26 DIAGNOSIS — Y92009 Unspecified place in unspecified non-institutional (private) residence as the place of occurrence of the external cause: Secondary | ICD-10-CM | POA: Insufficient documentation

## 2014-12-26 DIAGNOSIS — R4182 Altered mental status, unspecified: Secondary | ICD-10-CM | POA: Diagnosis not present

## 2014-12-26 DIAGNOSIS — Z8739 Personal history of other diseases of the musculoskeletal system and connective tissue: Secondary | ICD-10-CM | POA: Insufficient documentation

## 2014-12-26 DIAGNOSIS — R531 Weakness: Secondary | ICD-10-CM | POA: Diagnosis not present

## 2014-12-26 DIAGNOSIS — J45909 Unspecified asthma, uncomplicated: Secondary | ICD-10-CM | POA: Diagnosis not present

## 2014-12-26 DIAGNOSIS — D61818 Other pancytopenia: Secondary | ICD-10-CM | POA: Diagnosis not present

## 2014-12-26 DIAGNOSIS — W19XXXA Unspecified fall, initial encounter: Secondary | ICD-10-CM

## 2014-12-26 HISTORY — DX: Unspecified dementia, unspecified severity, without behavioral disturbance, psychotic disturbance, mood disturbance, and anxiety: F03.90

## 2014-12-26 NOTE — ED Provider Notes (Signed)
CSN: [id number]     Arrival date & time 12/26/14  2329 History   First MD Initiated Contact with Patient 12/26/14 2330     This chart was scribed for Ana Booze, MD by [name], ED Scribe. This patient was seen in room B18C/B18C and the patient's care was started 11:33 PM.   Chief Complaint  Patient presents with  . Altered Mental Status   The history is provided by the patient. No language interpreter was used.    HPI Comments: [name] brought in by EMS is a 74 y.o. female with a PMHx of HTN, osteoporosis, and kidney disease who presents to the Emergency Department here for altered mental status this evening. Daughter reported that pt has been confused all day and that she was not respondong to questions. Pt denies any CP, SOB, fever, or chills at this time. [name] was recently diagnosed with Dementia. She admits to frequent falls in last 2-3 weeks and states she hit her R side against a table during last fall. Balance and coordination has worsened in last 2-3 months. Pt admits to ongoing urinary issues she attributes to kidney disease. No known allergies to medications.  Past Medical History  Diagnosis Date  . Hypertension   . Abnormal heart rhythm   . Kidney disease   . Osteoporosis   . Scoliosis   . Afib     h/o  . Reactive airway disease   . Depression   . Echocardiogram abnormal 02/26/11    MVP,mild MR,AOV mildly sclerotic. EF >55%   Past Surgical History  Procedure Laterality Date  . Tubal ligation    . Cesarean section  1967  . Cesarean section  1971  . Abdominal hysterectomy  1980   Family History  Problem Relation Age of Onset  . Heart disease Father   . Cancer Father   . Cancer Mother    History  Substance Use Topics  . Smoking status: Never Smoker   . Smokeless tobacco: Not on file  . Alcohol Use: No   OB History    No data available     Review of Systems  Constitutional: Negative for fever and chills.  Respiratory: Positive for cough.  Negative for shortness of breath.   Musculoskeletal: Positive for arthralgias.  All other systems reviewed and are negative.     Allergies  Review of patient's allergies indicates no known allergies.  Home Medications   Prior to Admission medications   Medication Sig Start Date End Date Taking? Authorizing Provider  Budesonide-Formoterol Fumarate (SYMBICORT IN) Inhale 1-2 puffs into the lungs as needed (shortness of breath).    Historical Provider, MD  calcitRIOL (ROCALTROL) 0.25 MCG capsule Take 0.25 mcg by mouth. Mondays,Wednesdays, and Fridays    Historical Provider, MD  clonazePAM (KLONOPIN) 1 MG tablet TAKE 1 TO 2 TABLETS BY MOUTH 1 HOUR PRIOR TO BEDTIME 11/25/14   Ana Budge, MD  diltiazem (CARDIZEM CD) 240 MG 24 hr capsule TAKE ONE CAPSULE BY MOUTH EVERY DAY 12/14/14   Ana Croitoru, MD  diltiazem (CARDIZEM) 30 MG tablet TAKE 1 TABLET (30 MG TOTAL) BY MOUTH EVERY 8 (EIGHT) HOURS AS NEEDED (FOR SUSTAINED PALPITATIONS). 11/21/14   Ana Croitoru, MD  ferrous fumarate (HEMOCYTE - 106 MG FE) 325 (106 FE) MG TABS Take 1 tablet by mouth daily.    Historical Provider, MD  levothyroxine (SYNTHROID, LEVOTHROID) 112 MCG tablet Take 112 mcg by mouth daily before breakfast.    Historical Provider, MD  [name]  50 MG capsule Take 50 mg by mouth 2 (two) times daily as needed (pain).  08/24/14   Historical Provider, MD  omega-3 acid ethyl esters (LOVAZA) 1 G capsule Take 1 g by mouth daily.      Historical Provider, MD  ondansetron (ZOFRAN ODT) 4 MG disintegrating tablet Take 1 tablet (4 mg total) by mouth every 8 (eight) hours as needed for nausea. 4mg  ODT q4 hours prn nausea/vomit Patient not taking: Reported on 11/12/2014 06/28/14   Ana Hatchet, MD  potassium chloride SA (K-DUR,KLOR-CON) 20 MEQ tablet Take 20 mEq by mouth daily.      Historical Provider, MD  rOPINIRole (REQUIP) 0.5 MG tablet TAKE 1-2 TABLETS BY MOUTH AT BEDTIME FOR RESTLESS LEG 12/22/14   Ana Budge, MD  sertraline (ZOLOFT)  100 MG tablet Take 150 mg by mouth daily.    Historical Provider, MD  Vitamin D, Ergocalciferol, (DRISDOL) 50000 UNITS CAPS capsule Take 50,000 Units by mouth every 7 (seven) days. Mondays    Historical Provider, MD   Triage Vitals: BP 146/64 mmHg  Pulse 75  Temp(Src) 98.8 F (37.1 C) (Oral)  Resp 21  Ht 5\' 2"  (1.575 m)  Wt 130 lb (58.968 kg)  BMI 23.77 kg/m2  SpO2 99%   Physical Exam  Constitutional: She is oriented to person, place, and time. She appears well-developed and well-nourished. No distress.  HENT:  Head: Normocephalic and atraumatic.  Eyes: EOM are normal.  Neck: Normal range of motion.  Cardiovascular: Normal rate and regular rhythm.   Murmur heard. 2/6 systolic ejection murmur to L sternal border  Pulmonary/Chest: Effort normal and breath sounds normal.  Abdominal: Soft. She exhibits no distension. There is tenderness.  Mild tenderness to R pelvic rim   Musculoskeletal: Normal range of motion.  Neurological: She is alert and oriented to person, place, and time.  Skin: Skin is warm and dry.  Psychiatric: She has a normal mood and affect. Judgment normal.  Nursing note and vitals reviewed.   ED Course  Procedures (including critical care time)  DIAGNOSTIC STUDIES: Oxygen Saturation is 99% on RA, Normal by my interpretation.    COORDINATION OF CARE: 11:41 PM- Will order EKG. Discussed treatment plan with pt at bedside and pt agreed to plan.     Labs Review Results for orders placed or performed during the hospital encounter of 12/26/14  Comprehensive metabolic panel  Result Value Ref Range   Sodium 138 135 - 145 mmol/L   Potassium 4.0 3.5 - 5.1 mmol/L   Chloride 105 96 - 112 mmol/L   CO2 27 19 - 32 mmol/L   Glucose, Bld 98 70 - 99 mg/dL   BUN 29 (H) 6 - 23 mg/dL   Creatinine, Ser 4.09 (H) 0.50 - 1.10 mg/dL   Calcium 9.0 8.4 - 81.1 mg/dL   Total Protein 6.7 6.0 - 8.3 g/dL   Albumin 3.2 (L) 3.5 - 5.2 g/dL   AST 20 0 - 37 U/L   ALT 15 0 - 35 U/L    Alkaline Phosphatase 52 39 - 117 U/L   Total Bilirubin 0.5 0.3 - 1.2 mg/dL   GFR calc non Af Amer 21 (L) >90 mL/min   GFR calc Af Amer 25 (L) >90 mL/min   Anion gap 6 5 - 15  CBC with Differential  Result Value Ref Range   WBC 3.0 (L) 4.0 - 10.5 K/uL   RBC 3.01 (L) 3.87 - 5.11 MIL/uL   Hemoglobin 9.7 (L) 12.0 - 15.0  g/dL   HCT 14.729.5 (L) 82.936.0 - 56.246.0 %   MCV 98.0 78.0 - 100.0 fL   MCH 32.2 26.0 - 34.0 pg   MCHC 32.9 30.0 - 36.0 g/dL   RDW 13.014.5 86.511.5 - 78.415.5 %   Platelets 106 (L) 150 - 400 K/uL   Neutrophils Relative % 47 43 - 77 %   Neutro Abs 1.4 (L) 1.7 - 7.7 K/uL   Lymphocytes Relative 38 12 - 46 %   Lymphs Abs 1.1 0.7 - 4.0 K/uL   Monocytes Relative 11 3 - 12 %   Monocytes Absolute 0.3 0.1 - 1.0 K/uL   Eosinophils Relative 4 0 - 5 %   Eosinophils Absolute 0.1 0.0 - 0.7 K/uL   Basophils Relative 0 0 - 1 %   Basophils Absolute 0.0 0.0 - 0.1 K/uL  Urinalysis, Routine w reflex microscopic  Result Value Ref Range   Color, Urine YELLOW YELLOW   APPearance CLEAR CLEAR   Specific Gravity, Urine 1.014 1.005 - 1.030   pH 5.5 5.0 - 8.0   Glucose, UA NEGATIVE NEGATIVE mg/dL   Hgb urine dipstick NEGATIVE NEGATIVE   Bilirubin Urine NEGATIVE NEGATIVE   Ketones, ur NEGATIVE NEGATIVE mg/dL   Protein, ur NEGATIVE NEGATIVE mg/dL   Urobilinogen, UA 0.2 0.0 - 1.0 mg/dL   Nitrite NEGATIVE NEGATIVE   Leukocytes, UA NEGATIVE NEGATIVE    Imaging Review Dg Chest 2 View  12/27/2014   CLINICAL DATA:  Initial evaluation for acute altered mental status, fall.  EXAM: CHEST  2 VIEW  COMPARISON:  Prior study from 06/28/2014  FINDINGS: Cardiac and mediastinal silhouettes are stable in size and contour, and remain within normal limits.  Lungs are mildly hypoinflated. No focal infiltrate, pulmonary edema, or pleural effusion. There is no pneumothorax.  Scoliosis noted.  No acute osseus abnormality.  IMPRESSION: No active cardiopulmonary disease.   Electronically Signed   By: Rise MuBenjamin  McClintock M.D.   On:  12/27/2014 00:27   Dg Pelvis 1-2 Views  12/27/2014   CLINICAL DATA:  Initial valuation for acute altered mental status. Fall.  EXAM: PELVIS - 1-2 VIEW  COMPARISON:  None.  FINDINGS: The femoral heads are normally positioned within the acetabula. Femoral head heights are preserved. No acute fracture or dislocation. Linear lucency traversing the subcapital region of the left femur is stable from prior. No cortical disruption to suggest acute fracture. No pubic diastasis. SI joints are approximated.  Degenerative osteoarthrosis noted about the hips bilaterally. Degenerative changes also noted within the lower lumbar spine.  No acute soft tissue abnormality.  IMPRESSION: No acute fracture or dislocation within the pelvis.   Electronically Signed   By: Rise MuBenjamin  McClintock M.D.   On: 12/27/2014 00:31   Ct Head Wo Contrast  12/27/2014   CLINICAL DATA:  Initial evaluation for altered mental status  EXAM: CT HEAD WITHOUT CONTRAST  TECHNIQUE: Contiguous axial images were obtained from the base of the skull through the vertex without intravenous contrast.  COMPARISON:  Prior study from 11/12/2014  FINDINGS: Diffuse prominence of the CSF containing spaces is compatible with generalized cerebral atrophy. Patchy and confluent hypodensity within the periventricular deep white matter most consistent with chronic small vessel ischemic disease.  No acute large vessel territory infarct. No mass lesion or midline shift. No intracranial hemorrhage. Basal ganglia calcifications noted. No extra-axial fluid collection. No hydrocephalus.  No acute abnormality seen about the orbits. Scalp soft tissues within normal limits.  Calvarium intact. Paranasal sinuses are clear. No mastoid effusion.  IMPRESSION: 1. No acute intracranial process. 2. Atrophy with advanced chronic microvascular ischemic disease.   Electronically Signed   By: Rise Mu M.D.   On: 12/27/2014 00:21     EKG Interpretation   Date/Time:  Monday December 26 2014 23:30:55 EST Ventricular Rate:  81 PR Interval:  124 QRS Duration: 86 QT Interval:  373 QTC Calculation: 433 R Axis:   54 Text Interpretation:  Sinus rhythm Normal ECG When compared with ECG of  11/12/2014, No significant change was found Confirmed by [facility]  MD, Lanisa Ishler  ([id number]) on 12/26/2014 11:37:56 PM      MDM   Final diagnoses:  Altered mental status  Fall at home, initial encounter  Pancytopenia  Renal insufficiency    Report of spell at home of confusion and possible decreased responsiveness. No report of focal neurologic deficit to suggest TIA. I have attempted to call the patient's daughter but there was no answer. Patient is alert and oriented and neurologically intact at this point. She has had falls at home. She is sent for x-rays to rule out hip pelvis fracture and occult head injury and screening labs are obtained.  She has renal insufficiency which is stable. Pancytopenia is present with slight worsening of thrombocytopenia and slight drop in hemoglobin, but nothing to account for her current symptoms. Urinalysis shows no evidence of a UTI. Neurologic status is been stable in the ED. She is discharged with instructions to follow-up with PCP.  I personally performed the services described in this documentation, which was scribed in my presence. The recorded information has been reviewed and is accurate.     Ana Booze, MD 12/27/14 [phone]

## 2014-12-26 NOTE — ED Notes (Signed)
Per EMS, pt had an episode of "altered awareness", where pt did not lose consciousness, but would not respond to family's questions. Pt does not remember this episode. Pt recently diagnosed with dementia. Per daughter, [name], pt has been confused all day, reporting that the sheriff came to do a raid on her house, making everyone lay on the floor.

## 2014-12-27 ENCOUNTER — Emergency Department (HOSPITAL_COMMUNITY): Payer: Medicare Other

## 2014-12-27 DIAGNOSIS — R4182 Altered mental status, unspecified: Secondary | ICD-10-CM | POA: Diagnosis not present

## 2014-12-27 LAB — URINALYSIS, ROUTINE W REFLEX MICROSCOPIC
BILIRUBIN URINE: NEGATIVE
GLUCOSE, UA: NEGATIVE mg/dL
HGB URINE DIPSTICK: NEGATIVE
Ketones, ur: NEGATIVE mg/dL
Leukocytes, UA: NEGATIVE
Nitrite: NEGATIVE
PROTEIN: NEGATIVE mg/dL
Specific Gravity, Urine: 1.014 (ref 1.005–1.030)
Urobilinogen, UA: 0.2 mg/dL (ref 0.0–1.0)
pH: 5.5 (ref 5.0–8.0)

## 2014-12-27 LAB — COMPREHENSIVE METABOLIC PANEL
ALT: 15 U/L (ref 0–35)
ANION GAP: 6 (ref 5–15)
AST: 20 U/L (ref 0–37)
Albumin: 3.2 g/dL — ABNORMAL LOW (ref 3.5–5.2)
Alkaline Phosphatase: 52 U/L (ref 39–117)
BILIRUBIN TOTAL: 0.5 mg/dL (ref 0.3–1.2)
BUN: 29 mg/dL — AB (ref 6–23)
CHLORIDE: 105 mmol/L (ref 96–112)
CO2: 27 mmol/L (ref 19–32)
Calcium: 9 mg/dL (ref 8.4–10.5)
Creatinine, Ser: 2.21 mg/dL — ABNORMAL HIGH (ref 0.50–1.10)
GFR calc Af Amer: 25 mL/min — ABNORMAL LOW (ref >90)
GFR, EST NON AFRICAN AMERICAN: 21 mL/min — AB (ref >90)
Glucose, Bld: 98 mg/dL (ref 70–99)
POTASSIUM: 4 mmol/L (ref 3.5–5.1)
SODIUM: 138 mmol/L (ref 135–145)
TOTAL PROTEIN: 6.7 g/dL (ref 6.0–8.3)

## 2014-12-27 LAB — CBC WITH DIFFERENTIAL/PLATELET
BASOS PCT: 0 % (ref 0–1)
Basophils Absolute: 0 10*3/uL (ref 0.0–0.1)
Eosinophils Absolute: 0.1 10*3/uL (ref 0.0–0.7)
Eosinophils Relative: 4 % (ref 0–5)
HCT: 29.5 % — ABNORMAL LOW (ref 36.0–46.0)
Hemoglobin: 9.7 g/dL — ABNORMAL LOW (ref 12.0–15.0)
Lymphocytes Relative: 38 % (ref 12–46)
Lymphs Abs: 1.1 10*3/uL (ref 0.7–4.0)
MCH: 32.2 pg (ref 26.0–34.0)
MCHC: 32.9 g/dL (ref 30.0–36.0)
MCV: 98 fL (ref 78.0–100.0)
MONO ABS: 0.3 10*3/uL (ref 0.1–1.0)
MONOS PCT: 11 % (ref 3–12)
NEUTROS ABS: 1.4 10*3/uL — AB (ref 1.7–7.7)
NEUTROS PCT: 47 % (ref 43–77)
PLATELETS: 106 10*3/uL — AB (ref 150–400)
RBC: 3.01 MIL/uL — AB (ref 3.87–5.11)
RDW: 14.5 % (ref 11.5–15.5)
WBC: 3 10*3/uL — ABNORMAL LOW (ref 4.0–10.5)

## 2014-12-27 NOTE — ED Notes (Signed)
Patient family member called back and states she is on the way to retrieve patient.

## 2014-12-27 NOTE — Progress Notes (Signed)
Patient was discharged home by MD order; discharged instructions  review and give to patient and his daughter with care notes and prescriptions; IV DIC; Foley D/C: skin intact; patient will be escorted to the car by nurse tech via wheelchair.

## 2014-12-27 NOTE — Discharge Instructions (Signed)
Your evaluation did not show any obvious cause for the spells that you had at home. Return if you are having any problems.  Altered Mental Status Altered mental status most often refers to an abnormal change in your responsiveness and awareness. It can affect your speech, thought, mobility, memory, attention span, or alertness. It can range from slight confusion to complete unresponsiveness (coma). Altered mental status can be a sign of a serious underlying medical condition. Rapid evaluation and medical treatment is necessary for patients having an altered mental status. CAUSES   Low blood sugar (hypoglycemia) or diabetes.  Severe loss of body fluids (dehydration) or a body salt (electrolyte) imbalance.  A stroke or other neurologic problem, such as dementia or delirium.  A head injury or tumor.  A drug or alcohol overdose.  Exposure to toxins or poisons.  Depression, anxiety, and stress.  A low oxygen level (hypoxia).  An infection.  Blood loss.  Twitching or shaking (seizure).  Heart problems, such as heart attack or heart rhythm problems (arrhythmias).  A body temperature that is too low or too high (hypothermia or hyperthermia). DIAGNOSIS  A diagnosis is based on your history, symptoms, physical and neurologic examinations, and diagnostic tests. Diagnostic tests may include:  Measurement of your blood pressure, pulse, breathing, and oxygen levels (vital signs).  Blood tests.  Urine tests.  X-ray exams.  A computerized magnetic scan (magnetic resonance imaging, MRI).  A computerized X-ray scan (computed tomography, CT scan). TREATMENT  Treatment will depend on the cause. Treatment may include:  Management of an underlying medical or mental health condition.  Critical care or support in the hospital. HOME CARE INSTRUCTIONS   Only take over-the-counter or prescription medicines for pain, discomfort, or fever as directed by your caregiver.  Manage underlying  conditions as directed by your caregiver.  Eat a healthy, well-balanced diet to maintain strength.  Join a support group or prevention program to cope with the condition or trauma that caused the altered mental status. Ask your caregiver to help choose a program that works for you.  Follow up with your caregiver for further examination, therapy, or testing as directed. SEEK MEDICAL CARE IF:   You feel unwell or have chills.  You or your family notice a change in your behavior or your alertness.  You have trouble following your caregiver's treatment plan.  You have questions or concerns. SEEK IMMEDIATE MEDICAL CARE IF:   You have a rapid heartbeat or have chest pain.  You have difficulty breathing.  You have a fever.  You have a headache with a stiff neck.  You cough up blood.  You have blood in your urine or stool.  You have severe agitation or confusion. MAKE SURE YOU:   Understand these instructions.  Will watch your condition.  Will get help right away if you are not doing well or get worse. Document Released: 04/24/2010 Document Revised: 01/27/2012 Document Reviewed: 04/24/2010 [facility] Patient Information 2015 [place], [place]. This information is not intended to replace advice given to you by your health care provider. Make sure you discuss any questions you have with your health care provider.  Fall Prevention and Home Safety Falls cause injuries and can affect all age groups. It is possible to use preventive measures to significantly decrease the likelihood of falls. There are many simple measures which can make your home safer and prevent falls. OUTDOORS  Repair cracks and edges of walkways and driveways.  Remove high doorway thresholds.  Trim shrubbery on the  main path into your home.  Have good outside lighting.  Clear walkways of tools, rocks, debris, and clutter.  Check that handrails are not broken and are securely fastened. Both sides of steps  should have handrails.  Have leaves, snow, and ice cleared regularly.  Use sand or salt on walkways during winter months.  In the garage, clean up grease or oil spills. BATHROOM  Install night lights.  Install grab bars by the toilet and in the tub and shower.  Use non-skid mats or decals in the tub or shower.  Place a plastic non-slip stool in the shower to sit on, if needed.  Keep floors dry and clean up all water on the floor immediately.  Remove soap buildup in the tub or shower on a regular basis.  Secure bath mats with non-slip, double-sided rug tape.  Remove throw rugs and tripping hazards from the floors. BEDROOMS  Install night lights.  Make sure a bedside light is easy to reach.  Do not use oversized bedding.  Keep a telephone by your bedside.  Have a firm chair with side arms to use for getting dressed.  Remove throw rugs and tripping hazards from the floor. KITCHEN  Keep handles on pots and pans turned toward the center of the stove. Use back burners when possible.  Clean up spills quickly and allow time for drying.  Avoid walking on wet floors.  Avoid hot utensils and knives.  Position shelves so they are not too high or low.  Place commonly used objects within easy reach.  If necessary, use a sturdy step stool with a grab bar when reaching.  Keep electrical cables out of the way.  Do not use floor polish or wax that makes floors slippery. If you must use wax, use non-skid floor wax.  Remove throw rugs and tripping hazards from the floor. STAIRWAYS  Never leave objects on stairs.  Place handrails on both sides of stairways and use them. Fix any loose handrails. Make sure handrails on both sides of the stairways are as long as the stairs.  Check carpeting to make sure it is firmly attached along stairs. Make repairs to worn or loose carpet promptly.  Avoid placing throw rugs at the top or bottom of stairways, or properly secure the rug  with carpet tape to prevent slippage. Get rid of throw rugs, if possible.  Have an electrician put in a light switch at the top and bottom of the stairs. OTHER FALL PREVENTION TIPS  Wear low-heel or rubber-soled shoes that are supportive and fit well. Wear closed toe shoes.  When using a stepladder, make sure it is fully opened and both spreaders are firmly locked. Do not climb a closed stepladder.  Add color or contrast paint or tape to grab bars and handrails in your home. Place contrasting color strips on first and last steps.  Learn and use mobility aids as needed. Install an electrical emergency response system.  Turn on lights to avoid dark areas. Replace light bulbs that burn out immediately. Get light switches that glow.  Arrange furniture to create clear pathways. Keep furniture in the same place.  Firmly attach carpet with non-skid or double-sided tape.  Eliminate uneven floor surfaces.  Select a carpet pattern that does not visually hide the edge of steps.  Be aware of all pets. OTHER HOME SAFETY TIPS  Set the water temperature for 120 F (48.8 C).  Keep emergency numbers on or near the telephone.  Keep smoke detectors  on every level of the home and near sleeping areas. Document Released: 10/25/2002 Document Revised: 05/05/2012 Document Reviewed: 01/24/2012 [facility] Patient Information 2015 [place]. This information is not intended to replace advice given to you by your health care provider. Make sure you discuss any questions you have with your health care provider.

## 2014-12-27 NOTE — ED Notes (Signed)
Called [name] Rice(daughter) to secure ride, no answer, left message for family member.

## 2014-12-27 NOTE — Telephone Encounter (Signed)
Rx refill denied to patient pharmacy   

## 2014-12-27 NOTE — ED Notes (Signed)
Attempted to call [name] Rice(daughter), again no answer, left message for family member.

## 2014-12-27 NOTE — ED Notes (Addendum)
Pt given Malaw[place]key sandwich, cola, graham crackers and peanut butter per RN Tobi BastosAnna.

## 2014-12-28 ENCOUNTER — Other Ambulatory Visit: Payer: Self-pay

## 2014-12-28 MED ORDER — METOPROLOL TARTRATE 25 MG PO TABS: 12.5000 mg | ORAL_TABLET | Freq: Two times a day (BID) | ORAL | Status: DC

## 2014-12-28 NOTE — Telephone Encounter (Signed)
Rx(s) sent to pharmacy electronically.  

## 2014-12-29 DIAGNOSIS — I129 Hypertensive chronic kidney disease with stage 1 through stage 4 chronic kidney disease, or unspecified chronic kidney disease: Secondary | ICD-10-CM | POA: Diagnosis not present

## 2014-12-30 DIAGNOSIS — R55 Syncope and collapse: Secondary | ICD-10-CM | POA: Diagnosis not present

## 2014-12-30 DIAGNOSIS — R296 Repeated falls: Secondary | ICD-10-CM | POA: Diagnosis not present

## 2014-12-30 DIAGNOSIS — I10 Essential (primary) hypertension: Secondary | ICD-10-CM | POA: Diagnosis not present

## 2014-12-30 DIAGNOSIS — I779 Disorder of arteries and arterioles, unspecified: Secondary | ICD-10-CM | POA: Diagnosis not present

## 2014-12-30 DIAGNOSIS — F328 Other depressive episodes: Secondary | ICD-10-CM | POA: Diagnosis not present

## 2015-01-05 ENCOUNTER — Emergency Department (HOSPITAL_COMMUNITY): Payer: Medicare Other

## 2015-01-05 ENCOUNTER — Encounter (HOSPITAL_COMMUNITY): Payer: Self-pay | Admitting: *Deleted

## 2015-01-05 ENCOUNTER — Inpatient Hospital Stay (HOSPITAL_COMMUNITY)
Admission: EM | Admit: 2015-01-05 | Discharge: 2015-01-10 | DRG: 391 | Disposition: A | Discharge status: Home / Self Care | Payer: Medicare Other | Attending: Internal Medicine | Admitting: Internal Medicine

## 2015-01-05 DIAGNOSIS — E43 Unspecified severe protein-calorie malnutrition: Secondary | ICD-10-CM | POA: Diagnosis present

## 2015-01-05 DIAGNOSIS — Z6821 Body mass index (BMI) 21.0-21.9, adult: Secondary | ICD-10-CM

## 2015-01-05 DIAGNOSIS — F328 Other depressive episodes: Secondary | ICD-10-CM | POA: Diagnosis not present

## 2015-01-05 DIAGNOSIS — R1030 Lower abdominal pain, unspecified: Secondary | ICD-10-CM | POA: Diagnosis not present

## 2015-01-05 DIAGNOSIS — E039 Hypothyroidism, unspecified: Secondary | ICD-10-CM | POA: Diagnosis present

## 2015-01-05 DIAGNOSIS — I471 Supraventricular tachycardia: Secondary | ICD-10-CM | POA: Diagnosis present

## 2015-01-05 DIAGNOSIS — Z801 Family history of malignant neoplasm of trachea, bronchus and lung: Secondary | ICD-10-CM | POA: Diagnosis not present

## 2015-01-05 DIAGNOSIS — R197 Diarrhea, unspecified: Secondary | ICD-10-CM | POA: Diagnosis not present

## 2015-01-05 DIAGNOSIS — R059 Cough, unspecified: Secondary | ICD-10-CM

## 2015-01-05 DIAGNOSIS — G2581 Restless legs syndrome: Secondary | ICD-10-CM

## 2015-01-05 DIAGNOSIS — D61818 Other pancytopenia: Secondary | ICD-10-CM | POA: Diagnosis not present

## 2015-01-05 DIAGNOSIS — R1084 Generalized abdominal pain: Secondary | ICD-10-CM | POA: Diagnosis not present

## 2015-01-05 DIAGNOSIS — Z96652 Presence of left artificial knee joint: Secondary | ICD-10-CM | POA: Diagnosis not present

## 2015-01-05 DIAGNOSIS — E875 Hyperkalemia: Secondary | ICD-10-CM | POA: Diagnosis present

## 2015-01-05 DIAGNOSIS — R112 Nausea with vomiting, unspecified: Secondary | ICD-10-CM | POA: Diagnosis not present

## 2015-01-05 DIAGNOSIS — A084 Viral intestinal infection, unspecified: Principal | ICD-10-CM | POA: Diagnosis present

## 2015-01-05 DIAGNOSIS — D759 Disease of blood and blood-forming organs, unspecified: Secondary | ICD-10-CM | POA: Diagnosis not present

## 2015-01-05 DIAGNOSIS — I129 Hypertensive chronic kidney disease with stage 1 through stage 4 chronic kidney disease, or unspecified chronic kidney disease: Secondary | ICD-10-CM | POA: Diagnosis not present

## 2015-01-05 DIAGNOSIS — R1110 Vomiting, unspecified: Secondary | ICD-10-CM | POA: Diagnosis present

## 2015-01-05 DIAGNOSIS — I779 Disorder of arteries and arterioles, unspecified: Secondary | ICD-10-CM | POA: Diagnosis not present

## 2015-01-05 DIAGNOSIS — K649 Unspecified hemorrhoids: Secondary | ICD-10-CM | POA: Diagnosis present

## 2015-01-05 DIAGNOSIS — R296 Repeated falls: Secondary | ICD-10-CM | POA: Diagnosis not present

## 2015-01-05 DIAGNOSIS — N183 Chronic kidney disease, stage 3 (moderate): Secondary | ICD-10-CM | POA: Diagnosis not present

## 2015-01-05 DIAGNOSIS — R109 Unspecified abdominal pain: Secondary | ICD-10-CM | POA: Diagnosis not present

## 2015-01-05 DIAGNOSIS — R55 Syncope and collapse: Secondary | ICD-10-CM | POA: Diagnosis not present

## 2015-01-05 DIAGNOSIS — I10 Essential (primary) hypertension: Secondary | ICD-10-CM | POA: Diagnosis not present

## 2015-01-05 DIAGNOSIS — R05 Cough: Secondary | ICD-10-CM

## 2015-01-05 HISTORY — DX: Anxiety disorder, unspecified: F41.9

## 2015-01-05 HISTORY — DX: Hypothyroidism, unspecified: E03.9

## 2015-01-05 HISTORY — DX: Chronic kidney disease, stage 3 unspecified: N18.30

## 2015-01-05 HISTORY — DX: Unspecified osteoarthritis, unspecified site: M19.90

## 2015-01-05 HISTORY — DX: Gastro-esophageal reflux disease without esophagitis: K21.9

## 2015-01-05 HISTORY — DX: Sleep apnea, unspecified: G47.30

## 2015-01-05 HISTORY — DX: Chronic kidney disease, stage 3 (moderate): N18.3

## 2015-01-05 LAB — URINALYSIS, ROUTINE W REFLEX MICROSCOPIC
Glucose, UA: NEGATIVE mg/dL
Hgb urine dipstick: NEGATIVE
KETONES UR: 15 mg/dL — AB
Leukocytes, UA: NEGATIVE
NITRITE: NEGATIVE
PH: 5.5 (ref 5.0–8.0)
Protein, ur: NEGATIVE mg/dL
Specific Gravity, Urine: 1.025 (ref 1.005–1.030)
Urobilinogen, UA: 1 mg/dL (ref 0.0–1.0)

## 2015-01-05 LAB — COMPREHENSIVE METABOLIC PANEL
ALK PHOS: 70 U/L (ref 39–117)
ALT: 14 U/L (ref 0–35)
ANION GAP: 13 (ref 5–15)
AST: 21 U/L (ref 0–37)
Albumin: 4.1 g/dL (ref 3.5–5.2)
BUN: 33 mg/dL — AB (ref 6–23)
CO2: 24 mmol/L (ref 19–32)
CREATININE: 1.84 mg/dL — AB (ref 0.50–1.10)
Calcium: 9.7 mg/dL (ref 8.4–10.5)
Chloride: 102 mmol/L (ref 96–112)
GFR calc non Af Amer: 26 mL/min — ABNORMAL LOW (ref >90)
GFR, EST AFRICAN AMERICAN: 31 mL/min — AB (ref >90)
GLUCOSE: 90 mg/dL (ref 70–99)
POTASSIUM: 3.7 mmol/L (ref 3.5–5.1)
Sodium: 139 mmol/L (ref 135–145)
TOTAL PROTEIN: 7.8 g/dL (ref 6.0–8.3)
Total Bilirubin: 1 mg/dL (ref 0.3–1.2)

## 2015-01-05 LAB — CBC WITH DIFFERENTIAL/PLATELET
Basophils Absolute: 0 10*3/uL (ref 0.0–0.1)
Basophils Relative: 0 % (ref 0–1)
Eosinophils Absolute: 0 10*3/uL (ref 0.0–0.7)
Eosinophils Relative: 0 % (ref 0–5)
HEMATOCRIT: 40.3 % (ref 36.0–46.0)
Hemoglobin: 13.7 g/dL (ref 12.0–15.0)
LYMPHS ABS: 1.4 10*3/uL (ref 0.7–4.0)
LYMPHS PCT: 22 % (ref 12–46)
MCH: 32 pg (ref 26.0–34.0)
MCHC: 34 g/dL (ref 30.0–36.0)
MCV: 94.2 fL (ref 78.0–100.0)
Monocytes Absolute: 0.8 10*3/uL (ref 0.1–1.0)
Monocytes Relative: 12 % (ref 3–12)
Neutro Abs: 4.5 10*3/uL (ref 1.7–7.7)
Neutrophils Relative %: 66 % (ref 43–77)
PLATELETS: 216 10*3/uL (ref 150–400)
RBC: 4.28 MIL/uL (ref 3.87–5.11)
RDW: 13.8 % (ref 11.5–15.5)
WBC: 6.7 10*3/uL (ref 4.0–10.5)

## 2015-01-05 LAB — LIPASE, BLOOD: LIPASE: 95 U/L — AB (ref 11–59)

## 2015-01-05 MED ORDER — METRONIDAZOLE IN NACL 5-0.79 MG/ML-% IV SOLN
500.0000 mg | Freq: Three times a day (TID) | INTRAVENOUS | Status: DC
  Administered 2015-01-05 – 2015-01-06 (×2): 500 mg via INTRAVENOUS
  Filled 2015-01-05 (×4): qty 100

## 2015-01-05 MED ORDER — CLONIDINE HCL 0.2 MG PO TABS
0.2000 mg | ORAL_TABLET | Freq: Two times a day (BID) | ORAL | Status: DC
  Administered 2015-01-05: 0.2 mg via ORAL
  Filled 2015-01-05 (×2): qty 1
  Filled 2015-01-05: qty 2
  Filled 2015-01-05: qty 1

## 2015-01-05 MED ORDER — IOHEXOL 300 MG/ML  SOLN
25.0000 mL | INTRAMUSCULAR | Status: AC
  Administered 2015-01-05 (×2): 25 mL via ORAL

## 2015-01-05 MED ORDER — ONDANSETRON HCL 4 MG/2ML IJ SOLN
4.0000 mg | Freq: Four times a day (QID) | INTRAMUSCULAR | Status: DC
  Administered 2015-01-05 – 2015-01-08 (×7): 4 mg via INTRAVENOUS
  Filled 2015-01-05 (×7): qty 2

## 2015-01-05 MED ORDER — BUDESONIDE-FORMOTEROL FUMARATE 80-4.5 MCG/ACT IN AERO
2.0000 | INHALATION_SPRAY | RESPIRATORY_TRACT | Status: DC | PRN
  Filled 2015-01-05: qty 6.9

## 2015-01-05 MED ORDER — HEPARIN SODIUM (PORCINE) 5000 UNIT/ML IJ SOLN
5000.0000 [IU] | Freq: Three times a day (TID) | INTRAMUSCULAR | Status: DC
  Administered 2015-01-05 – 2015-01-08 (×8): 5000 [IU] via SUBCUTANEOUS
  Filled 2015-01-05 (×12): qty 1

## 2015-01-05 MED ORDER — SERTRALINE HCL 50 MG PO TABS
150.0000 mg | ORAL_TABLET | Freq: Every day | ORAL | Status: DC
  Administered 2015-01-05 – 2015-01-10 (×6): 150 mg via ORAL
  Filled 2015-01-05 (×6): qty 1

## 2015-01-05 MED ORDER — METOPROLOL TARTRATE 12.5 MG HALF TABLET
12.5000 mg | ORAL_TABLET | Freq: Two times a day (BID) | ORAL | Status: DC
  Administered 2015-01-05 – 2015-01-10 (×9): 12.5 mg via ORAL
  Filled 2015-01-05 (×11): qty 1

## 2015-01-05 MED ORDER — SODIUM CHLORIDE 0.9 % IV BOLUS (SEPSIS)
1000.0000 mL | Freq: Once | INTRAVENOUS | Status: AC
  Administered 2015-01-05: 1000 mL via INTRAVENOUS

## 2015-01-05 MED ORDER — ONDANSETRON HCL 4 MG PO TABS
4.0000 mg | ORAL_TABLET | Freq: Four times a day (QID) | ORAL | Status: DC
  Administered 2015-01-06 – 2015-01-10 (×11): 4 mg via ORAL
  Filled 2015-01-05 (×25): qty 1

## 2015-01-05 MED ORDER — CALCITRIOL 0.25 MCG PO CAPS
0.2500 ug | ORAL_CAPSULE | ORAL | Status: DC
  Administered 2015-01-06 – 2015-01-09 (×2): 0.25 ug via ORAL
  Filled 2015-01-05 (×2): qty 1

## 2015-01-05 MED ORDER — VITAMIN D (ERGOCALCIFEROL) 1.25 MG (50000 UNIT) PO CAPS
50000.0000 [IU] | ORAL_CAPSULE | ORAL | Status: DC
  Administered 2015-01-09: 50000 [IU] via ORAL
  Filled 2015-01-05: qty 1

## 2015-01-05 MED ORDER — SODIUM CHLORIDE 0.9 % IV SOLN
INTRAVENOUS | Status: DC
  Administered 2015-01-05: 20:00:00 via INTRAVENOUS

## 2015-01-05 MED ORDER — ROPINIROLE HCL 0.5 MG PO TABS
0.5000 mg | ORAL_TABLET | Freq: Every evening | ORAL | Status: DC | PRN
  Administered 2015-01-08: 0.5 mg via ORAL
  Filled 2015-01-05 (×2): qty 1

## 2015-01-05 MED ORDER — DILTIAZEM HCL ER COATED BEADS 240 MG PO CP24
240.0000 mg | ORAL_CAPSULE | Freq: Every day | ORAL | Status: DC
  Administered 2015-01-05 – 2015-01-06 (×2): 240 mg via ORAL
  Filled 2015-01-05 (×3): qty 1

## 2015-01-05 MED ORDER — FENTANYL CITRATE 0.05 MG/ML IJ SOLN
50.0000 ug | Freq: Once | INTRAMUSCULAR | Status: AC
  Administered 2015-01-05: 50 ug via INTRAVENOUS
  Filled 2015-01-05: qty 2

## 2015-01-05 MED ORDER — SODIUM CHLORIDE 0.9 % IV SOLN
INTRAVENOUS | Status: DC
  Administered 2015-01-05 – 2015-01-06 (×2): via INTRAVENOUS

## 2015-01-05 MED ORDER — DILTIAZEM HCL 30 MG PO TABS
30.0000 mg | ORAL_TABLET | Freq: Four times a day (QID) | ORAL | Status: DC | PRN
  Filled 2015-01-05: qty 1

## 2015-01-05 MED ORDER — LEVOTHYROXINE SODIUM 112 MCG PO TABS
112.0000 ug | ORAL_TABLET | Freq: Every day | ORAL | Status: DC
  Administered 2015-01-06 – 2015-01-10 (×5): 112 ug via ORAL
  Filled 2015-01-05 (×6): qty 1

## 2015-01-05 MED ORDER — ONDANSETRON HCL 4 MG/2ML IJ SOLN
4.0000 mg | Freq: Once | INTRAMUSCULAR | Status: AC
  Administered 2015-01-05: 4 mg via INTRAVENOUS
  Filled 2015-01-05: qty 2

## 2015-01-05 MED ORDER — FERROUS FUMARATE 325 (106 FE) MG PO TABS
1.0000 | ORAL_TABLET | Freq: Every day | ORAL | Status: DC
  Administered 2015-01-06 – 2015-01-07 (×2): 106 mg via ORAL
  Filled 2015-01-05 (×5): qty 1

## 2015-01-05 MED ORDER — CLONAZEPAM 1 MG PO TABS
1.0000 mg | ORAL_TABLET | Freq: Every day | ORAL | Status: DC
  Administered 2015-01-05 – 2015-01-09 (×5): 1 mg via ORAL
  Filled 2015-01-05 (×6): qty 1

## 2015-01-05 MED ORDER — PREGABALIN 50 MG PO CAPS
50.0000 mg | ORAL_CAPSULE | Freq: Two times a day (BID) | ORAL | Status: DC
  Administered 2015-01-05 – 2015-01-10 (×9): 50 mg via ORAL
  Filled 2015-01-05 (×10): qty 1

## 2015-01-05 MED ORDER — CIPROFLOXACIN IN D5W 400 MG/200ML IV SOLN
400.0000 mg | Freq: Two times a day (BID) | INTRAVENOUS | Status: DC
  Administered 2015-01-05 – 2015-01-06 (×2): 400 mg via INTRAVENOUS
  Filled 2015-01-05 (×3): qty 200

## 2015-01-05 NOTE — H&P (Signed)
Hospitalist Admission History and Physical  Patient name: [name] Medical record number: [id number] Date of birth: Oct 10, 1941 Age: 74 y.o. Gender: female  Primary Care Provider: Geraldo PitterBLAND,VEITA J, MD  Chief Complaint: vomiting and diarrhea   History of Present Illness:This is a 74 y.o. year old female with significant past medical history of stage 3 CKD, atrial tachycardia, HTN, hypothyroidism presenting with vomiting and diarrhea. Pt reports 7-10 days of recurrent vomiting and diarrhea. Has not been able to hold anything down by mouth. Subjective fevers and chills. No known sick contacts. Has not been able to take medications 2/2 nausea. Emesis and diarrhea NBNB. Mild RLQ tenderness over past 2-3 days.  Presented to ER T 98.3, HR 60s-70s, resp 10s, BP 130s-160s, satting 96% on RA. CBC and CMET WNL apart from Cr 1.84 (baseline 2.2). CT abd and pelvis with no acute intra-abdominal findings. ? Stranding R inguinal soft tissues.   Assessment and Plan: Ana FillersJudy C Betty is a 74 y.o. year old female presenting with vomiting and diarrhea.   Active Problems:   Vomiting and diarrhea   1-Vomiting and diarrhea -no acute intra-abdominal findings on imaging -? R inguinal soft tissue findings-benign exam -place on cipro and flagyl empirically  -stool studies including cdiff, stool culture, fecal lactoferrin -anti emetics  -follow   2-CKD  -stage 3 chronically  -at baseline  -follow   3- HTN -BP stable  -hold portion of meds overnight as pt has been unable to tolerate orals over past week  4-Atrial tachycardia -cont dilt  -HR stable  -tele bed   FEN/GI: NPO for now  Prophylaxis: sub q heparin  Disposition: pending further evaluation  Code Status:Full Code    Patient Active Problem List   Diagnosis Date Noted  . Vomiting and diarrhea 01/05/2015  . Sleep walking 10/08/2014  . Restless legs 10/08/2014  . Essential hypertension 09/16/2014  . Chronic kidney disease, stage 3  09/16/2014  . Paroxysmal atrial tachycardia 09/16/2014  . Hypothyroidism 09/16/2014  . Insomnia with sleep apnea 07/18/2011   Past Medical History: Past Medical History  Diagnosis Date  . Hypertension   . Abnormal heart rhythm   . Kidney disease   . Osteoporosis   . Scoliosis   . Afib     h/o  . Reactive airway disease   . Depression   . Echocardiogram abnormal 02/26/11    MVP,mild MR,AOV mildly sclerotic. EF >55%  . Dementia     Past Surgical History: Past Surgical History  Procedure Laterality Date  . Tubal ligation    . Cesarean section  1967  . Cesarean section  1971  . Abdominal hysterectomy  1980    Social History: History   Social History  . Marital Status: Divorced    Spouse Name: N/A  . Number of Children: 2  . Years of Education: N/A   Social History Main Topics  . Smoking status: Never Smoker   . Smokeless tobacco: Not on file  . Alcohol Use: No  . Drug Use: No  . Sexual Activity: Not on file   Other Topics Concern  . None   Social History Narrative    Family History: Family History  Problem Relation Age of Onset  . Heart disease Father   . Cancer Father   . Cancer Mother     Allergies: No Known Allergies  Current Facility-Administered Medications  Medication Dose Route Frequency Provider Last Rate Last Dose  . 0.9 %  sodium chloride infusion   Intravenous Continuous Lebron ConnersMarcy  Pfeiffer, MD 125 mL/hr at 01/05/15 1945    . 0.9 %  sodium chloride infusion   Intravenous Continuous Doree Albee, MD      . budesonide-formoterol [facility]) 80-4.5 MCG/ACT inhaler 2 puff  2 puff Inhalation PRN Doree Albee, MD      . calcitRIOL (ROCALTROL) capsule 0.25 mcg  0.25 mcg Oral Daily Doree Albee, MD      . ciprofloxacin (CIPRO) IVPB 400 mg  400 mg Intravenous Q12H Doree Albee, MD      . clonazePAM Scarlette [facility]ico) tablet 1 mg  1 mg Oral QHS Doree Albee, MD      . cloNIDine (CATAPRES) tablet 0.2 mg  0.2 mg Oral BID Doree Albee, MD      . diltiazem  (CARDIZEM CD) 24 hr capsule 240 mg  240 mg Oral Daily Doree Albee, MD      . diltiazem (CARDIZEM) tablet 30 mg  30 mg Oral Q6H PRN Doree Albee, MD      . ferrous fumarate (HEMOCYTE - 106 mg FE) tablet 106 mg of iron  1 tablet Oral Daily Doree Albee, MD      . heparin injection 5,000 Units  5,000 Units Subcutaneous 3 times per day Doree Albee, MD      . Melene Muller ON 01/06/2015] levothyroxine (SYNTHROID, LEVOTHROID) tablet 112 mcg  112 mcg Oral QAC breakfast Doree Albee, MD      . metoprolol tartrate (LOPRESSOR) tablet 12.5 mg  12.5 mg Oral BID Doree Albee, MD      . metroNIDAZOLE (FLAGYL) IVPB 500 mg  500 mg Intravenous Q8H Doree Albee, MD      . ondansetron [facility]) tablet 4 mg  4 mg Oral 4 times per day Doree Albee, MD       Or  . ondansetron [facility]) injection 4 mg  4 mg Intravenous 4 times per day Doree Albee, MD      . pregabalin (LYRICA) capsule 50 mg  50 mg Oral BID Doree Albee, MD      . rOPINIRole (REQUIP) tablet 0.5 mg  0.5 mg Oral TID Doree Albee, MD      . sertraline (ZOLOFT) tablet 150 mg  150 mg Oral Daily Doree Albee, MD      . Vitamin D (Ergocalciferol) (DRISDOL) capsule 50,000 Units  50,000 Units Oral Q7 days Doree Albee, MD       Current Outpatient Prescriptions  Medication Sig Dispense Refill  . Budesonide-Formoterol Fumarate (SYMBICORT IN) Inhale 1-2 puffs into the lungs as needed (shortness of breath).    . calcitRIOL (ROCALTROL) 0.25 MCG capsule Take 0.25 mcg by mouth. Mondays,Wednesdays, and Fridays    . clonazePAM (KLONOPIN) 1 MG tablet TAKE 1 TO 2 TABLETS BY MOUTH 1 HOUR PRIOR TO BEDTIME 60 tablet 5  . cloNIDine (CATAPRES) 0.2 MG tablet Take 0.2 mg by mouth 2 (two) times daily.    [name] diltiazem (CARDIZEM CD) 240 MG 24 hr capsule TAKE ONE CAPSULE BY MOUTH EVERY DAY 90 capsule 2  . diltiazem (CARDIZEM) 30 MG tablet TAKE 1 TABLET (30 MG TOTAL) BY MOUTH EVERY 8 (EIGHT) HOURS AS NEEDED (FOR SUSTAINED PALPITATIONS). 90 tablet 1  . ferrous fumarate  (HEMOCYTE - 106 MG FE) 325 (106 FE) MG TABS Take 1 tablet by mouth daily.    . hydrochlorothiazide (HYDRODIURIL) 25 MG tablet Take 25 mg by mouth daily.    [name] levothyroxine (SYNTHROID, LEVOTHROID) 112 MCG tablet Take 112 mcg by mouth daily before breakfast.    . LYRICA 50 MG capsule Take  50 mg by mouth 2 (two) times daily.     . metoprolol tartrate (LOPRESSOR) 25 MG tablet Take 0.5 tablets (12.5 mg total) by mouth 2 (two) times daily. 30 tablet 8  . omega-3 acid ethyl esters (LOVAZA) 1 G capsule Take 1 g by mouth daily.      . potassium chloride SA (K-DUR,KLOR-CON) 20 MEQ tablet Take 20 mEq by mouth daily.      [name] rOPINIRole (REQUIP) 0.5 MG tablet TAKE 1-2 TABLETS BY MOUTH AT BEDTIME FOR RESTLESS LEG 30 tablet 0  . sertraline (ZOLOFT) 100 MG tablet Take 150 mg by mouth daily.    . Vitamin D, Ergocalciferol, (DRISDOL) 50000 UNITS CAPS capsule Take 50,000 Units by mouth every 7 (seven) days. Mondays    . ondansetron (ZOFRAN ODT) 4 MG disintegrating tablet Take 1 tablet (4 mg total) by mouth every 8 (eight) hours as needed for nausea.  ODT q4 hours prn nausea/vomit (Patient not taking: Reported on 11/12/2014) 30 tablet 0   Review Of Systems: 12 point ROS negative except as noted above in HPI.  Physical Exam: Filed Vitals:   01/05/15 1930  BP: 130/75  Pulse: 71  Temp:   Resp:     General: alert and cooperative HEENT: PERRLA and extra ocular movement intact Heart: S1, S2 normal, no murmur, rub or gallop, regular rate and rhythm Lungs: clear to auscultation, no wheezes or rales and unlabored breathing Abdomen: + bowel sounds, mild generalized TTP on exam  Extremities: extremities normal, atraumatic, no cyanosis or edema Skin:no rashes Neurology: normal without focal findings  Labs and Imaging: Lab Results  Component Value Date/Time   NA 139 01/05/2015 02:40 PM   K 3.7 01/05/2015 02:40 PM   CL 102 01/05/2015 02:40 PM   CO2 24 01/05/2015 02:40 PM   BUN 33* 01/05/2015 02:40 PM    CREATININE 1.84* 01/05/2015 02:40 PM   CREATININE 2.28* 09/15/2014 12:24 PM   GLUCOSE 90 01/05/2015 02:40 PM   Lab Results  Component Value Date   WBC 6.7 01/05/2015   HGB 13.7 01/05/2015   HCT 40.3 01/05/2015   MCV 94.2 01/05/2015   PLT 216 01/05/2015   Urinalysis    Component Value Date/Time   COLORURINE YELLOW 01/05/2015 1430   APPEARANCEUR CLOUDY* 01/05/2015 1430   LABSPEC 1.025 01/05/2015 1430   PHURINE 5.5 01/05/2015 1430   GLUCOSEU NEGATIVE 01/05/2015 1430   HGBUR NEGATIVE 01/05/2015 1430   BILIRUBINUR LARGE* 01/05/2015 1430   KETONESUR 15* 01/05/2015 1430   PROTEINUR NEGATIVE 01/05/2015 1430   UROBILINOGEN 1.0 01/05/2015 1430   NITRITE NEGATIVE 01/05/2015 1430   LEUKOCYTESUR NEGATIVE 01/05/2015 1430      Ct Abdomen Pelvis Wo Contrast  01/05/2015   CLINICAL DATA:  Low abdominal pain for 1 week  EXAM: CT ABDOMEN AND PELVIS WITHOUT CONTRAST  TECHNIQUE: Multidetector CT imaging of the abdomen and pelvis was performed following the standard protocol without IV contrast.  COMPARISON:  02/11/2010  FINDINGS: Lower chest:  Lung bases are clear.  Hepatobiliary: Unenhanced liver and gallbladder appear unremarkable.  Pancreas: Normal  Spleen: Normal  Adrenals/Urinary Tract: Adrenal glands are unremarkable. Mild lobulation to the renal cortical contour bilaterally without hydroureteronephrosis. No radiopaque renal or ureteral calculus. The bladder is decompressed but unremarkable.  Stomach/Bowel: Stomach and bowel appear normal.  Vascular/Lymphatic: Mild atheromatous aortic calcification without aneurysm. No lymphadenopathy.  Other: Minimal haziness at the root of the small bowel mesentery is nonspecific but stable since 2011. No ascites or free air. Small fat containing left  inguinal hernia. Uterus presumed surgically absent. No adnexal mass.  Musculoskeletal: Minimal stranding is noted in the region of the right groin, correlate clinically for any evidence of cellulitis or other skin  abnormality. S shaped thoracolumbar scoliosis is identified. No acute abnormality.  IMPRESSION: No acute intra-abdominal or pelvic pathology.  Minimal stranding in the right inguinal subcutaneous soft tissues, correlate clinically for any evidence of cellulitis or other dermal abnormality.   Electronically Signed   By: Christiana Pellant M.D.   On: 01/05/2015 17:44           Doree Albee MD  Pager: [phone]

## 2015-01-05 NOTE — ED Notes (Signed)
Patient transported to CT 

## 2015-01-05 NOTE — ED Notes (Signed)
Pt here from UC for emesis, diarrhea and diffuse abdominal pain x 1.5 weeks.  Her home healthcare RN sent her to UC today for a weight loss of 15 lbs since she first became ill.

## 2015-01-05 NOTE — ED Notes (Signed)
Patient only about half done with first cup of contrast. Encouraged her to continue to drink the contrast. Second cup given.

## 2015-01-05 NOTE — ED Provider Notes (Signed)
CSN: [id number]     Arrival date & time 01/05/15  1250 History   First MD Initiated Contact with Patient 01/05/15 1347     Chief Complaint  Patient presents with  . Emesis  . Diarrhea     (Consider location/radiation/quality/duration/timing/severity/associated sxs/prior Treatment) HPI Comments:  74 year old female who is been suffering with diarrhea and vomiting for the past week and a half. Her daughter states she has lost 15 pounds in this time period. She complains of generalized abdominal pain as well.  Patient is a 74 y.o. female presenting with vomiting.  Emesis Severity:  Severe Duration:  one  and a half weeks. Timing:  Constant Quality:  Stomach contents (  watery) Progression:  Worsening Chronicity:  New Relieved by:  Nothing Worsened by:  Nothing tried Ineffective treatments:  None tried Associated symptoms: abdominal pain ( Geeneralized) and diarrhea   Associated symptoms: no fever     Past Medical History  Diagnosis Date  . Hypertension   . Abnormal heart rhythm   . Kidney disease   . Osteoporosis   . Scoliosis   . Afib     h/o  . Reactive airway disease   . Depression   . Echocardiogram abnormal 02/26/11    MVP,mild MR,AOV mildly sclerotic. EF >55%  . Dementia    Past Surgical History  Procedure Laterality Date  . Tubal ligation    . Cesarean section  1967  . Cesarean section  1971  . Abdominal hysterectomy  1980   Family History  Problem Relation Age of Onset  . Heart disease Father   . Cancer Father   . Cancer Mother    History  Substance Use Topics  . Smoking status: Never Smoker   . Smokeless tobacco: Not on file  . Alcohol Use: No   OB History    No data available     Review of Systems  Gastrointestinal: Positive for vomiting, abdominal pain ( Geeneralized) and diarrhea.  All other systems reviewed and are negative.     Allergies  Review of patient's allergies indicates no known allergies.  Home Medications   Prior to  Admission medications   Medication Sig Start Date End Date Taking? Authorizing Provider  Budesonide-Formoterol Fumarate (SYMBICORT IN) Inhale 1-2 puffs into the lungs as needed (shortness of breath).   Yes Historical Provider, MD  calcitRIOL (ROCALTROL) 0.25 MCG capsule Take 0.25 mcg by mouth. Mondays,Wednesdays, and Fridays   Yes Historical Provider, MD  clonazePAM (KLONOPIN) 1 MG tablet TAKE 1 TO 2 TABLETS BY MOUTH 1 HOUR PRIOR TO BEDTIME 11/25/14  Yes Waymon Budge, MD  cloNIDine (CATAPRES) 0.2 MG tablet Take 0.2 mg by mouth 2 (two) times daily.   Yes Historical Provider, MD  diltiazem (CARDIZEM CD) 240 MG 24 hr capsule TAKE ONE CAPSULE BY MOUTH EVERY DAY 12/14/14  Yes Mihai Croitoru, MD  diltiazem (CARDIZEM) 30 MG tablet TAKE 1 TABLET (30 MG TOTAL) BY MOUTH EVERY 8 (EIGHT) HOURS AS NEEDED (FOR SUSTAINED PALPITATIONS). 11/21/14  Yes Mihai Croitoru, MD  ferrous fumarate (HEMOCYTE - 106 MG FE) 325 (106 FE) MG TABS Take 1 tablet by mouth daily.   Yes Historical Provider, MD  hydrochlorothiazide (HYDRODIURIL) 25 MG tablet Take 25 mg by mouth daily.   Yes Historical Provider, MD  levothyroxine (SYNTHROID, LEVOTHROID) 112 MCG tablet Take 112 mcg by mouth daily before breakfast.   Yes Historical Provider, MD  LYRICA 50 MG capsule Take 50 mg by mouth 2 (two) times daily.  08/24/14  Yes Historical Provider, MD  metoprolol tartrate (LOPRESSOR) 25 MG tablet Take 0.5 tablets (12.5 mg total) by mouth 2 (two) times daily. 12/28/14  Yes Mihai Croitoru, MD  omega-3 acid ethyl esters (LOVAZA) 1 G capsule Take 1 g by mouth daily.     Yes Historical Provider, MD  potassium chloride SA (K-DUR,KLOR-CON) 20 MEQ tablet Take 20 mEq by mouth daily.     Yes Historical Provider, MD  rOPINIRole (REQUIP) 0.5 MG tablet TAKE 1-2 TABLETS BY MOUTH AT BEDTIME FOR RESTLESS LEG 12/22/14  Yes Waymon Budgelinton D Young, MD  sertraline (ZOLOFT) 100 MG tablet Take 150 mg by mouth daily.   Yes Historical Provider, MD  Vitamin D, Ergocalciferol,  (DRISDOL) 50000 UNITS CAPS capsule Take 50,000 Units by mouth every 7 (seven) days. Mondays   Yes Historical Provider, MD  ondansetron (ZOFRAN ODT) 4 MG disintegrating tablet Take 1 tablet (4 mg total) by mouth every 8 (eight) hours as needed for nausea. 4mg  ODT q4 hours prn nausea/vomit Patient not taking: Reported on 11/12/2014 06/28/14   Lorenda HatchetAdam L Rothman, MD   BP 147/62 mmHg  Pulse 67  Temp(Src) 98.3 F (36.8 C) (Oral)  Resp 21  Ht 5\' 3"  (1.6 m)  Wt 115 lb (52.164 kg)  BMI 20.38 kg/m2  SpO2 100% Physical Exam  Constitutional: She is oriented to person, place, and time. She appears well-developed and well-nourished. No distress.  HENT:  Head: Normocephalic and atraumatic.  Mouth/Throat: Oropharynx is clear and moist. Mucous membranes are dry.  Eyes: Conjunctivae are normal. Pupils are equal, round, and reactive to light. No scleral icterus.  Neck: Neck supple.  Cardiovascular: Normal rate, regular rhythm, normal heart sounds and intact distal pulses.   No murmur heard. Pulmonary/Chest: Effort normal and breath sounds normal. No stridor. No respiratory distress. She has no rales.  Abdominal: Soft. Bowel sounds are normal. She exhibits no distension. There is generalized tenderness. There is no rigidity, no rebound and no guarding.  Musculoskeletal: Normal range of motion.  Neurological: She is alert and oriented to person, place, and time.  Skin: Skin is warm and dry. No rash noted.  Psychiatric: She has a normal mood and affect. Her behavior is normal.  Nursing note and vitals reviewed.   ED Course  Procedures (including critical care time) Labs Review Labs Reviewed  COMPREHENSIVE METABOLIC PANEL - Abnormal; Notable for the following:    BUN 33 (*)    Creatinine, Ser 1.84 (*)    GFR calc non Af Amer 26 (*)    GFR calc Af Amer 31 (*)    All other components within normal limits  LIPASE, BLOOD - Abnormal; Notable for the following:    Lipase 95 (*)    All other components  within normal limits  URINALYSIS, ROUTINE W REFLEX MICROSCOPIC - Abnormal; Notable for the following:    APPearance CLOUDY (*)    Bilirubin Urine LARGE (*)    Ketones, ur 15 (*)    All other components within normal limits  CBC WITH DIFFERENTIAL/PLATELET    Imaging Review No results found.   EKG Interpretation None      MDM   Final diagnoses:  Abdominal pain    74 yo female presenting  With a week and a half of diarrhea and vomiting and abdominal pain. Nontoxic, but does appear dehydrated. She has diffuse abdominal pain, which is worse on the right upper and right lower quadrants. Plan IV fluids, labs, UA, CT.  Labs show elevated lipase, otherwise are reassuring.  CT pending.  Have ordered additional zofran and now fentanyl.  Care transferred to Dr. Seth Bake pending CT results.    Candyce Churn III, MD 01/05/15 [phone]

## 2015-01-05 NOTE — ED Notes (Signed)
Patient returned from CT

## 2015-01-05 NOTE — Progress Notes (Signed)
New Admission Note:   Arrival Method:  Via stretcher from ED Mental Orientation: A&Ox4 Telemetry: NSR Assessment: Completed Skin: INTACT IV: NS@125  Pain: DENIES Tubes: N/A Safety Measures: Safety Fall Prevention Plan has been given, discussed and signed Admission: Completed [address] Orientation: Patient has been orientated to the room, unit and staff.  Family: NONE PRESENT  Orders have been reviewed and implemented. Will continue to monitor the patient. Call light has been placed within reach and bed alarm has been activated.   De Nursey Alvilda Mckenna BSN, Publishing copyN  Phone number: [phone]

## 2015-01-06 LAB — CBC WITH DIFFERENTIAL/PLATELET
BASOS PCT: 0 % (ref 0–1)
Basophils Absolute: 0 10*3/uL (ref 0.0–0.1)
EOS PCT: 0 % (ref 0–5)
Eosinophils Absolute: 0 10*3/uL (ref 0.0–0.7)
HEMATOCRIT: 30.4 % — AB (ref 36.0–46.0)
HEMOGLOBIN: 10.1 g/dL — AB (ref 12.0–15.0)
Lymphocytes Relative: 34 % (ref 12–46)
Lymphs Abs: 1.1 10*3/uL (ref 0.7–4.0)
MCH: 31.6 pg (ref 26.0–34.0)
MCHC: 33.2 g/dL (ref 30.0–36.0)
MCV: 95 fL (ref 78.0–100.0)
MONO ABS: 0.4 10*3/uL (ref 0.1–1.0)
MONOS PCT: 11 % (ref 3–12)
Neutro Abs: 1.8 10*3/uL (ref 1.7–7.7)
Neutrophils Relative %: 54 % (ref 43–77)
Platelets: 120 10*3/uL — ABNORMAL LOW (ref 150–400)
RBC: 3.2 MIL/uL — ABNORMAL LOW (ref 3.87–5.11)
RDW: 13.6 % (ref 11.5–15.5)
WBC: 3.3 10*3/uL — ABNORMAL LOW (ref 4.0–10.5)

## 2015-01-06 LAB — COMPREHENSIVE METABOLIC PANEL
ALBUMIN: 2.8 g/dL — AB (ref 3.5–5.2)
ALT: 11 U/L (ref 0–35)
AST: 13 U/L (ref 0–37)
Alkaline Phosphatase: 46 U/L (ref 39–117)
Anion gap: 4 — ABNORMAL LOW (ref 5–15)
BUN: 23 mg/dL (ref 6–23)
CALCIUM: 8.4 mg/dL (ref 8.4–10.5)
CO2: 26 mmol/L (ref 19–32)
CREATININE: 1.51 mg/dL — AB (ref 0.50–1.10)
Chloride: 109 mmol/L (ref 96–112)
GFR calc Af Amer: 39 mL/min — ABNORMAL LOW (ref >90)
GFR, EST NON AFRICAN AMERICAN: 34 mL/min — AB (ref >90)
Glucose, Bld: 86 mg/dL (ref 70–99)
Potassium: 3.5 mmol/L (ref 3.5–5.1)
Sodium: 139 mmol/L (ref 135–145)
Total Bilirubin: 0.7 mg/dL (ref 0.3–1.2)
Total Protein: 5.4 g/dL — ABNORMAL LOW (ref 6.0–8.3)

## 2015-01-06 LAB — GLUCOSE, CAPILLARY: GLUCOSE-CAPILLARY: 81 mg/dL (ref 70–99)

## 2015-01-06 LAB — TSH: TSH: 1.734 u[IU]/mL (ref 0.350–4.500)

## 2015-01-06 LAB — SEDIMENTATION RATE: Sed Rate: 10 mm/hr (ref 0–22)

## 2015-01-06 MED ORDER — OXYCODONE HCL 5 MG PO TABS
5.0000 mg | ORAL_TABLET | Freq: Four times a day (QID) | ORAL | Status: AC | PRN
  Administered 2015-01-06 – 2015-01-09 (×2): 5 mg via ORAL
  Filled 2015-01-06 (×2): qty 1

## 2015-01-06 MED ORDER — ENSURE COMPLETE PO LIQD
237.0000 mL | Freq: Two times a day (BID) | ORAL | Status: DC
  Administered 2015-01-06 – 2015-01-10 (×6): 237 mL via ORAL

## 2015-01-06 MED ORDER — SODIUM CHLORIDE 0.9 % IV BOLUS (SEPSIS)
500.0000 mL | Freq: Once | INTRAVENOUS | Status: AC
  Administered 2015-01-06: 500 mL via INTRAVENOUS

## 2015-01-06 MED ORDER — POTASSIUM CHLORIDE IN NACL 20-0.9 MEQ/L-% IV SOLN
INTRAVENOUS | Status: DC
  Administered 2015-01-06 (×2): via INTRAVENOUS
  Filled 2015-01-06 (×5): qty 1000

## 2015-01-06 MED ORDER — BUDESONIDE-FORMOTEROL FUMARATE 80-4.5 MCG/ACT IN AERO
2.0000 | INHALATION_SPRAY | Freq: Two times a day (BID) | RESPIRATORY_TRACT | Status: DC
  Administered 2015-01-06 – 2015-01-10 (×7): 2 via RESPIRATORY_TRACT
  Filled 2015-01-06: qty 6.9

## 2015-01-06 MED ORDER — BUDESONIDE-FORMOTEROL FUMARATE 80-4.5 MCG/ACT IN AERO
2.0000 | INHALATION_SPRAY | Freq: Two times a day (BID) | RESPIRATORY_TRACT | Status: DC
  Filled 2015-01-06: qty 6.9

## 2015-01-06 MED ORDER — ACETAMINOPHEN 325 MG PO TABS
650.0000 mg | ORAL_TABLET | Freq: Four times a day (QID) | ORAL | Status: DC | PRN
  Administered 2015-01-06 – 2015-01-09 (×3): 650 mg via ORAL
  Filled 2015-01-06 (×2): qty 2

## 2015-01-06 NOTE — Progress Notes (Signed)
[facility] PROGRESS NOTE  CONSTANCE WHITTLE ZOX:[id number] DOB: 02-10-1941 DOA: 01/05/2015 PCP: Geraldo Pitter, MD  Assessment/Plan: Active Problems:   Vomiting and diarrhea    1-Vomiting and diarrhea improving -no acute intra-abdominal findings on imaging -? R inguinal soft tissue findings-benign exam, CT scan of the abdomen negative Likely viral gastroenteritis Discontinue ciprofloxacin and Flagyl -stool studies including cdiff, stool culture, fecal lactoferrin ordered but patient has had no diarrhea therefore we'll discontinue stool studies unless diarrhea recurs   2-CKD , stage III, improving -stage 3 chronically  1.84> 1.50 Continue IV hydration  3- HTN -BP soft, discontinue clonidine Can continue Cardizem and Lopressor  4-Atrial tachycardia -cont dilt  -HR stable  -tele bed    Code Status: full Family Communication: family updated about patient's clinical progress Disposition Plan:  PT/OT eval, anticipate discharge tomorrow    Brief narrative: History of Present Illness:This is a 74 y.o. year old female with significant past medical history of stage 3 CKD, atrial tachycardia, HTN, hypothyroidism presenting with vomiting and diarrhea. Pt reports 7-10 days of recurrent vomiting and diarrhea. Has not been able to hold anything down by mouth. Subjective fevers and chills. No known sick contacts. Has not been able to take medications 2/2 nausea. Emesis and diarrhea NBNB. Mild RLQ tenderness over past 2-3 days.  Presented to ER T 98.3, HR 60s-70s, resp 10s, BP 130s-160s, satting 96% on RA. CBC and CMET WNL apart from Cr 1.84 (baseline 2.2). CT abd and pelvis with no acute intra-abdominal findings. ? Stranding R inguinal soft tissues.    Consultants:  None  Procedures:  None  Antibiotics: Cipro and Flagyl discontinued  HPI/Subjective: No diarrhea vomiting since admission, afebrile Requesting to eat  Objective: Filed Vitals:   01/05/15 2030  01/05/15 2103 01/06/15 0429 01/06/15 0927  BP: 126/61 129/47 98/40 110/47  Pulse: 76 69 56 61  Temp:  99.4 F (37.4 C) 98 F (36.7 C) 97.8 F (36.6 C)  TempSrc:  Oral Oral Oral  Resp: Height:   (1.6 m)    Weight:  55.656 kg (122 lb 11.2 oz)    SpO2: 97% 99% 98% 100%    Intake/Output Summary (Last 24 hours) at 01/06/15 1252 Last data filed at 01/06/15 1232  Gross per 24 hour  Intake 1633.33 ml  Output    250 ml  Net 1383.33 ml    Exam:  General: No acute respiratory distress Lungs: Clear to auscultation bilaterally without wheezes or crackles Cardiovascular: Regular rate and rhythm without murmur gallop or rub normal S1 and S2 Abdomen: Nontender, nondistended, soft, bowel sounds positive, no rebound, no ascites, no appreciable mass Extremities: No significant cyanosis, clubbing, or edema bilateral lower extremities      Data Reviewed: Basic Metabolic Panel:  Recent Labs Lab 01/05/15 1440 01/06/15 0535  NA 139 139  K 3.7 3.5  CL 102 109  CO2 24 26  GLUCOSE 90 86  BUN 33* 23  CREATININE 1.84* 1.51*  CALCIUM 9.7 8.4    Liver Function Tests:  Recent Labs Lab 01/05/15 1440 01/06/15 0535  AST 21 13  ALT 14 11  ALKPHOS 70 46  BILITOT 1.0 0.7  PROT 7.8 5.4*  ALBUMIN 4.1 2.8*    Recent Labs Lab 01/05/15 1440  LIPASE 95*   No results for input(s): AMMONIA in the last 168 hours.  CBC:  Recent Labs Lab 01/05/15 1440 01/06/15 0535  WBC 6.7 3.3*  NEUTROABS 4.5 1.8  HGB 13.7 10.1*  HCT 40.3 30.4*  MCV 94.2 95.0  PLT 216 120*    Cardiac Enzymes: No results for input(s): CKTOTAL, CKMB, CKMBINDEX, TROPONINI in the last 168 hours. BNP (last 3 results) No results for input(s): BNP in the last 8760 hours.  ProBNP (last 3 results) No results for input(s): PROBNP in the last 8760 hours.    CBG:  Recent Labs Lab 01/06/15 0730  GLUCAP 81    No results found for this or any previous visit (from the past 240 hour(s)).    Studies: Ct Abdomen Pelvis Wo Contrast  01/05/2015   CLINICAL DATA:  Low abdominal pain for 1 week  EXAM: CT ABDOMEN AND PELVIS WITHOUT CONTRAST  TECHNIQUE: Multidetector CT imaging of the abdomen and pelvis was performed following the standard protocol without IV contrast.  COMPARISON:  02/11/2010  FINDINGS: Lower chest:  Lung bases are clear.  Hepatobiliary: Unenhanced liver and gallbladder appear unremarkable.  Pancreas: Normal  Spleen: Normal  Adrenals/Urinary Tract: Adrenal glands are unremarkable. Mild lobulation to the renal cortical contour bilaterally without hydroureteronephrosis. No radiopaque renal or ureteral calculus. The bladder is decompressed but unremarkable.  Stomach/Bowel: Stomach and bowel appear normal.  Vascular/Lymphatic: Mild atheromatous aortic calcification without aneurysm. No lymphadenopathy.  Other: Minimal haziness at the root of the small bowel mesentery is nonspecific but stable since 2011. No ascites or free air. Small fat containing left inguinal hernia. Uterus presumed surgically absent. No adnexal mass.  Musculoskeletal: Minimal stranding is noted in the region of the right groin, correlate clinically for any evidence of cellulitis or other skin abnormality. S shaped thoracolumbar scoliosis is identified. No acute abnormality.  IMPRESSION: No acute intra-abdominal or pelvic pathology.  Minimal stranding in the right inguinal subcutaneous soft tissues, correlate clinically for any evidence of cellulitis or other dermal abnormality.   Electronically Signed   By: Christiana Pellant M.D.   On: 01/05/2015 17:44   Dg Chest 2 View  12/27/2014   CLINICAL DATA:  Initial evaluation for acute altered mental status, fall.  EXAM: CHEST  2 VIEW  COMPARISON:  Prior study from 06/28/2014  FINDINGS: Cardiac and mediastinal silhouettes are stable in size and contour, and remain within normal limits.  Lungs are mildly hypoinflated. No focal infiltrate, pulmonary edema, or pleural effusion.  There is no pneumothorax.  Scoliosis noted.  No acute osseus abnormality.  IMPRESSION: No active cardiopulmonary disease.   Electronically Signed   By: Rise Mu M.D.   On: 12/27/2014 00:27   Dg Pelvis 1-2 Views  12/27/2014   CLINICAL DATA:  Initial valuation for acute altered mental status. Fall.  EXAM: PELVIS - 1-2 VIEW  COMPARISON:  None.  FINDINGS: The femoral heads are normally positioned within the acetabula. Femoral head heights are preserved. No acute fracture or dislocation. Linear lucency traversing the subcapital region of the left femur is stable from prior. No cortical disruption to suggest acute fracture. No pubic diastasis. SI joints are approximated.  Degenerative osteoarthrosis noted about the hips bilaterally. Degenerative changes also noted within the lower lumbar spine.  No acute soft tissue abnormality.  IMPRESSION: No acute fracture or dislocation within the pelvis.   Electronically Signed   By: Rise Mu M.D.   On: 12/27/2014 00:31   Ct Head Wo Contrast  12/27/2014   CLINICAL DATA:  Initial evaluation for altered mental status  EXAM: CT HEAD WITHOUT CONTRAST  TECHNIQUE: Contiguous axial images were obtained from the base of the skull through the vertex without intravenous contrast.  COMPARISON:  Prior  study from 11/12/2014  FINDINGS: Diffuse prominence of the CSF containing spaces is compatible with generalized cerebral atrophy. Patchy and confluent hypodensity within the periventricular deep white matter most consistent with chronic small vessel ischemic disease.  No acute large vessel territory infarct. No mass lesion or midline shift. No intracranial hemorrhage. Basal ganglia calcifications noted. No extra-axial fluid collection. No hydrocephalus.  No acute abnormality seen about the orbits. Scalp soft tissues within normal limits.  Calvarium intact. Paranasal sinuses are clear. No mastoid effusion.  IMPRESSION: 1. No acute intracranial process. 2. Atrophy with  advanced chronic microvascular ischemic disease.   Electronically Signed   By: Rise MuBenjamin  McClintock M.D.   On: 12/27/2014 00:21    Scheduled Meds: . budesonide-formoterol  2 puff Inhalation BID  . calcitRIOL  0.25 mcg Oral Q M,W,F  . clonazePAM  1 mg Oral QHS  . diltiazem  240 mg Oral Daily  . ferrous fumarate  1 tablet Oral Daily  . heparin  5,000 Units Subcutaneous 3 times per day  . levothyroxine  112 mcg Oral QAC breakfast  . metoprolol tartrate  12.5 mg Oral BID  . ondansetron  4 mg Oral 4 times per day   Or  . ondansetron (ZOFRAN) IV  4 mg Intravenous 4 times per day  . pregabalin  50 mg Oral BID  . sertraline  150 mg Oral Daily  . [START ON 01/09/2015] Vitamin D (Ergocalciferol)  50,000 Units Oral Q7 days   Continuous Infusions: . 0.9 % NaCl with KCl 20 mEq / L 100 mL/hr at 01/06/15 1215    Active Problems:   Vomiting and diarrhea    Time spent: 40 minutes   [facility] Cross[facility] Surgery CenterBROL,Garnie Borchardt  [facility] Pager [phone]. If 7PM-7AM, please contact night-coverage at [email or web address], password [email or web address] 01/06/2015, 12:52 PM

## 2015-01-06 NOTE — Evaluation (Signed)
Physical Therapy Evaluation Patient Details Name: GRAINNE KNIGHTS MRN: [id number] DOB: 1941-09-05 Today's Date: 01/06/2015   History of Present Illness  Patient is a 74 yo female admitted 01/05/15 with 7-10 day history of vomiting and diarrhea.  PMH:  CKD, atrial tachycardia, HTN, Hypothyroidism, scoliosis, depression  Clinical Impression  Patient presents with problems listed below.  Will benefit from acute PT to maximize independence prior to returning home.  Do not anticipate any f/u PT needs at d/c.    Follow Up Recommendations No PT follow up;Supervision - Intermittent    Equipment Recommendations  None recommended by PT    Recommendations for Other Services       Precautions / Restrictions Precautions Precautions: Fall Precaution Comments: Patient has h/o falls, with last one being several days pta - hit RLE/thigh on table Restrictions Weight Bearing Restrictions: No      Mobility  Bed Mobility Overal bed mobility: Modified Independent             General bed mobility comments: Use of bed rail and increased time.  Patient able to sit EOB and don socks with good balance  Transfers Overall transfer level: Modified independent Equipment used: Rolling walker (2 wheeled)             General transfer comment: Patient used correct hand placement.  No physical assist needed.  Ambulation/Gait Ambulation/Gait assistance: Supervision Ambulation Distance (Feet): 32 Feet Assistive device: Rolling walker (2 wheeled) Gait Pattern/deviations: Step-through pattern;Decreased stride length;Trunk flexed Gait velocity: Decreased Gait velocity interpretation: Below normal speed for age/gender General Gait Details: Verbal cues for use of RW.  Patient with slow, steady gait.  Stairs            Wheelchair Mobility    Modified Rankin (Stroke Patients Only)       Balance Overall balance assessment: History of Falls                                            Pertinent Vitals/Pain Pain Assessment: 0-10 Pain Score: 3  Pain Location: RLQ abdomen Pain Descriptors / Indicators: Aching;Cramping Pain Intervention(s): Monitored during session;Repositioned    Home Living Family/patient expects to be discharged to:: Private residence Living Arrangements: Alone Available Help at Discharge: Family;Available PRN/intermittently (May go to her daughter's home or to her own home) Type of Home: Mobile home Home Access: Stairs to enter Entrance Stairs-Rails: Right;Left Entrance Stairs-Number of Steps: 4 Home Layout: One level Home Equipment: Walker - 2 wheels;Cane - single point;Walker - 4 wheels;Bedside commode;Shower seat      Prior Function Level of Independence: Independent         Comments: Driving pta     Hand Dominance        Extremity/Trunk Assessment   Upper Extremity Assessment: Overall WFL for tasks assessed           Lower Extremity Assessment: Generalized weakness      Cervical / Trunk Assessment: Other exceptions (scoliosis)  Communication   Communication: No difficulties  Cognition Arousal/Alertness: Awake/alert Behavior During Therapy: WFL for tasks assessed/performed Overall Cognitive Status: Within Functional Limits for tasks assessed                      General Comments      Exercises        Assessment/Plan    PT Assessment Patient needs continued PT  services  PT Diagnosis Difficulty walking;Abnormality of gait;Generalized weakness;Acute pain   PT Problem List Decreased strength;Decreased activity tolerance;Decreased balance;Decreased mobility;Pain  PT Treatment Interventions DME instruction;Gait training;Stair training;Functional mobility training;Therapeutic activities;Therapeutic exercise;Patient/family education   PT Goals (Current goals can be found in the Care Plan section) Acute Rehab PT Goals Patient Stated Goal: To go home PT Goal Formulation: With patient Time For  Goal Achievement: 01/13/15 Potential to Achieve Goals: Good    Frequency Min 3X/week   Barriers to discharge Decreased caregiver support Daughter works    Co-evaluation               End of Session Equipment Utilized During Treatment: Gait belt Activity Tolerance: Patient limited by fatigue Patient left: in bed;with call bell/phone within reach;with bed alarm set Nurse Communication: Mobility status (No f/u PT needs)    Functional Assessment Tool Used: Clinical judgement Functional Limitation: Mobility: Walking and moving around Mobility: Walking and Moving Around Current Status [phone](G8978): At least 1 percent but less than 20 percent impaired, limited or restricted Mobility: Walking and Moving Around Goal Status [phone](G8979): 0 percent impaired, limited or restricted    Time: 1521-1540 PT Time Calculation (min) (ACUTE ONLY): 19 min   Charges:   PT Evaluation $Initial PT Evaluation Tier I: 1 Procedure     PT G Codes:   PT G-Codes **NOT FOR INPATIENT CLASS** Functional Assessment Tool Used: Clinical judgement Functional Limitation: Mobility: Walking and moving around Mobility: Walking and Moving Around Current Status ([id number](G8978): At least 1 percent but less than 20 percent impaired, limited or restricted Mobility: Walking and Moving Around Goal Status [phone](G8979): 0 percent impaired, limited or restricted    Vena AustriaDavis, Lerone Onder H 01/06/2015, 3:49 PM Durenda HurtSusan H. Renaldo Fiddleravis, PT, [facility] HospitalMBA Acute Rehab Services Pager [phone]

## 2015-01-06 NOTE — Evaluation (Addendum)
Occupational Therapy Evaluation Patient Details Name: [name] MRN: [id number] DOB: 1941-05-07 Today's Date: 01/06/2015    History of Present Illness Patient is a 74 yo female admitted 01/05/15 with 7-10 day history of vomiting and diarrhea.  PMH:  CKD, atrial tachycardia, HTN, Hypothyroidism, scoliosis, depression   Clinical Impression   PTA pt lived at home and was independent with ADLs. Pt currently at Mod I level with use of RW and educated on fall prevention and energy conservation. No further acute OT needs.     Follow Up Recommendations  No OT follow up;Supervision/Assistance - 24 hour    Equipment Recommendations  None recommended by OT    Recommendations for Other Services       Precautions / Restrictions Precautions Precautions: Fall Precaution Comments: Patient has h/o falls, with last one being several days pta - hit RLE/thigh on table Restrictions Weight Bearing Restrictions: No      Mobility Bed Mobility Overal bed mobility: Modified Independent             General bed mobility comments: Use of bed rail and increased time.  Patient able to sit EOB and don socks with good balance  Transfers Overall transfer level: Modified independent Equipment used: Rolling walker (2 wheeled)             General transfer comment: Patient used correct hand placement.  No physical assist needed.    Balance Overall balance assessment: History of Falls                                          ADL Overall ADL's : Modified independent                                       General ADL Comments: Pt at mod I level with use of RW. Encouraged pt to use at home and instructed on fall prevention and energy conservation, as pt reports she is feeling mildly weak.                Pertinent Vitals/Pain Pain Assessment: No/denies pain Pain Score: 3  Pain Location: RLQ abdomen Pain Descriptors / Indicators:  Aching;Cramping Pain Intervention(s): Monitored during session;Repositioned     Hand Dominance     Extremity/Trunk Assessment Upper Extremity Assessment Upper Extremity Assessment: Overall WFL for tasks assessed   Lower Extremity Assessment Lower Extremity Assessment: Generalized weakness   Cervical / Trunk Assessment Cervical / Trunk Assessment: Other exceptions (scoliosis) Cervical / Trunk Exceptions: Scoliosis   Communication Communication Communication: No difficulties   Cognition Arousal/Alertness: Awake/alert Behavior During Therapy: WFL for tasks assessed/performed Overall Cognitive Status: Within Functional Limits for tasks assessed                                Home Living Family/patient expects to be discharged to:: Private residence Living Arrangements: Alone Available Help at Discharge: Family;Available PRN/intermittently Type of Home: Mobile home Home Access: Stairs to enter Entrance Stairs-Number of Steps: 4 Entrance Stairs-Rails: Right;Left Home Layout: One level         FirefighterBathroom Toilet: Standard     Home Equipment: Environmental consultantWalker - 2 wheels;Cane - single point;Walker - 4 wheels;Bedside commode;Shower seat          Prior  Functioning/Environment Level of Independence: Independent        Comments: Driving pta    OT Diagnosis: Generalized weakness;Acute pain    End of Session Equipment Utilized During Treatment: Gait belt;Rolling walker  Activity Tolerance: Patient tolerated treatment well Patient left: in bed;with call bell/phone within reach;with bed alarm set   Time: 1610-9604 OT Time Calculation (min): 11 min Charges:  OT General Charges $OT Visit: 1 Procedure OT Evaluation $Initial OT Evaluation Tier I: 1 Procedure G-Codes: OT G-codes **NOT FOR INPATIENT CLASS** Functional Assessment Tool Used: clinical judgement Functional Limitation: Self care Self Care Current Status (V4098): At least 1 percent but less than 20 percent  impaired, limited or restricted Self Care Goal Status (J1914): At least 1 percent but less than 20 percent impaired, limited or restricted Self Care Discharge Status [phone]): At least 1 percent but less than 20 percent impaired, limited or restricted  Rae Lips 01/06/2015, 6:11 PM  Carney Living, OTR/L Occupational Therapist [phone] (pager)

## 2015-01-06 NOTE — Progress Notes (Signed)
INITIAL NUTRITION ASSESSMENT  Pt meets criteria for SEVERE MALNUTRITION in the context of acute illness or injury as evidenced by a 6% weight loss in 10 days and energy intake </= 50% for >/= 5 days.  DOCUMENTATION CODES Per approved criteria  -Severe malnutrition in the context of acute illness or injury   INTERVENTION: Provide Ensure Complete po BID, each supplement provides 350 kcal and 13 grams of protein.  Monitor magnesium, potassium, and phosphorus daily for at least 3 days, MD to replete as needed, as pt is at risk for refeeding syndrome given extreme poor po intake over 10 days.  Encourage adequate PO intake.  NUTRITION DIAGNOSIS: Inadequate oral intake related to vomiting, diarrhea as evidenced by pt report.   Goal: Pt to meet >/= 90% of their estimated nutrition needs   Monitor:  PO intake, weight trends, labs, I/O's  Reason for Assessment: MST  74 y.o. female  Admitting Dx: Vomiting and Diarrhea  ASSESSMENT: Pt with significant past medical history of stage 3 CKD, atrial tachycardia, HTN, hypothyroidism presenting with vomiting and diarrhea. Pt reports 7-10 days of recurrent vomiting and diarrhea. Has not been able to hold anything down by mouth.  Pt has been advanced to a soft diet. Pt reports a decreased appetite associated with abdominal discomfort. Pt has been having ongoing vomiting and diarrhea over the past week and a half. Pt unable to keep foods down. Pt is at risk for refeeding syndrome. Pt reports weight loss with usual weight of 130 lbs. Noted pt a 6% weight loss in 10 days. Pt is agreeable to Ensure. RD to order. Pt was educated on continuing with oral supplement use at home especially if appetite is decreased.  Nutrition Focused Physical Exam:  Subcutaneous Fat:  Orbital Region: N/A Upper Arm Region: Moderate depletion Thoracic and Lumbar Region: WNL  Muscle:  Temple Region: N/A Clavicle Bone Region: WNL Clavicle and Acromion Bone Region:  WNL Scapular Bone Region: N/A Dorsal Hand: N/A Patellar Region: WNL Anterior Thigh Region: WNL Posterior Calf Region: WNL  Edema: none  Labs: Low GFR. High creatinine.  Height: Ht Readings from Last 1 Encounters:  01/05/15 5\' 3"  (1.6 m)    Weight: Wt Readings from Last 1 Encounters:  01/05/15 122 lb 11.2 oz (55.656 kg)    Ideal Body Weight: 115 lbs  % Ideal Body Weight: 106%  Wt Readings from Last 10 Encounters:  01/05/15 122 lb 11.2 oz (55.656 kg)  12/26/14 130 lb (58.968 kg)  10/05/14 130 lb 9.6 oz (59.24 kg)  09/15/14 128 lb 8 oz (58.287 kg)  06/28/14 128 lb (58.06 kg)  04/04/14 134 lb (60.782 kg)  10/04/13 133 lb 12.8 oz (60.691 kg)  04/01/13 141 lb 12.8 oz (64.32 kg)  10/19/12 139 lb 9.6 oz (63.322 kg)  04/14/12 137 lb 12.8 oz (62.506 kg)    Usual Body Weight: 130 lbs  % Usual Body Weight: 94%  BMI:  Body mass index is 21.74 kg/(m^2).  Estimated Nutritional Needs: Kcal: 1700-1900 Protein: 70-85 grams Fluid: 1.7 - 1.9 L/dau  Skin: intact  Diet Order: DIET SOFT  EDUCATION NEEDS: -Education needs addressed   Intake/Output Summary (Last 24 hours) at 01/06/15 1124 Last data filed at 01/06/15 0814  Gross per 24 hour  Intake 1633.33 ml  Output    100 ml  Net 1533.33 ml    Last BM: 2/18  Labs:   Recent Labs Lab 01/05/15 1440 01/06/15 0535  NA 139 139  K 3.7 3.5  CL 102  109  CO2 24 26  BUN 33* 23  CREATININE 1.84* 1.51*  CALCIUM 9.7 8.4  GLUCOSE 90 86    CBG (last 3)   Recent Labs  01/06/15 0730  GLUCAP 81    Scheduled Meds: . calcitRIOL  0.25 mcg Oral Q M,W,F  . clonazePAM  1 mg Oral QHS  . diltiazem  240 mg Oral Daily  . ferrous fumarate  1 tablet Oral Daily  . heparin  5,000 Units Subcutaneous 3 times per day  . levothyroxine  112 mcg Oral QAC breakfast  . metoprolol tartrate  12.5 mg Oral BID  . ondansetron  4 mg Oral 4 times per day   Or  . ondansetron (ZOFRAN) IV  4 mg Intravenous 4 times per day  . pregabalin   50 mg Oral BID  . sertraline  150 mg Oral Daily  . [START ON 01/09/2015] Vitamin D (Ergocalciferol)  50,000 Units Oral Q7 days    Continuous Infusions: . 0.9 % NaCl with KCl 20 mEq / L      Past Medical History  Diagnosis Date  . Hypertension   . Abnormal heart rhythm   . Osteoporosis   . Scoliosis   . Afib     h/o  . Reactive airway disease   . Depression   . Echocardiogram abnormal 02/26/11    MVP,mild MR,AOV mildly sclerotic. EF >55%  . Dementia   . Chronic kidney disease (CKD), stage III (moderate)     [name] 01/05/2015  . Hypothyroidism   . Sleep apnea     "suppose to wear a mask; can't tolerate it" (01/05/2015)  . GERD (gastroesophageal reflux disease)   . Arthritis     "hips, shoulders" (01/05/2015)  . Anxiety     Past Surgical History  Procedure Laterality Date  . Tubal ligation    . Cesarean section  1967  . Cesarean section  1971  . Open reduction internal fixation (orif) distal radial fracture Left 12/2009    [name] 12/28/2009  . Total knee arthroplasty Left 03/2010    [name] 04/07/2010  . Joint replacement    . Vaginal hysterectomy  1980  . Dilation and curettage of uterus    . Cataract extraction, bilateral Bilateral     Marijean Niemann, MS, RD, LDN Pager # [phone] After hours/ weekend pager # [phone]

## 2015-01-06 NOTE — Progress Notes (Signed)
Patient's BP 88/44 also c/o pain on rt lower back/abdomen.K Kirby,NP notified.Order to give 500 cc NS bolus,and order for oxycodone written.Will continue to monitor. Margan Elias, Drinda Buttsharito Joselita, RCharity fundraiser

## 2015-01-06 NOTE — Progress Notes (Signed)
UR completed 

## 2015-01-07 DIAGNOSIS — E43 Unspecified severe protein-calorie malnutrition: Secondary | ICD-10-CM | POA: Insufficient documentation

## 2015-01-07 LAB — COMPREHENSIVE METABOLIC PANEL
ALBUMIN: 3 g/dL — AB (ref 3.5–5.2)
ALT: 9 U/L (ref 0–35)
ANION GAP: 4 — AB (ref 5–15)
AST: 16 U/L (ref 0–37)
Alkaline Phosphatase: 68 U/L (ref 39–117)
BILIRUBIN TOTAL: 0.4 mg/dL (ref 0.3–1.2)
BUN: 20 mg/dL (ref 6–23)
CO2: 22 mmol/L (ref 19–32)
Calcium: 9.2 mg/dL (ref 8.4–10.5)
Chloride: 115 mmol/L — ABNORMAL HIGH (ref 96–112)
Creatinine, Ser: 1.82 mg/dL — ABNORMAL HIGH (ref 0.50–1.10)
GFR calc Af Amer: 31 mL/min — ABNORMAL LOW (ref >90)
GFR calc non Af Amer: 27 mL/min — ABNORMAL LOW (ref >90)
Glucose, Bld: 121 mg/dL — ABNORMAL HIGH (ref 70–99)
Potassium: 3.8 mmol/L (ref 3.5–5.1)
Sodium: 141 mmol/L (ref 135–145)
Total Protein: 5.7 g/dL — ABNORMAL LOW (ref 6.0–8.3)

## 2015-01-07 LAB — CBC WITH DIFFERENTIAL/PLATELET
BASOS ABS: 0 10*3/uL (ref 0.0–0.1)
Basophils Relative: 0 % (ref 0–1)
EOS ABS: 0.1 10*3/uL (ref 0.0–0.7)
Eosinophils Relative: 1 % (ref 0–5)
HCT: 33 % — ABNORMAL LOW (ref 36.0–46.0)
HEMOGLOBIN: 10.8 g/dL — AB (ref 12.0–15.0)
Lymphocytes Relative: 33 % (ref 12–46)
Lymphs Abs: 1.5 10*3/uL (ref 0.7–4.0)
MCH: 31.4 pg (ref 26.0–34.0)
MCHC: 32.7 g/dL (ref 30.0–36.0)
MCV: 95.9 fL (ref 78.0–100.0)
Monocytes Absolute: 0.5 10*3/uL (ref 0.1–1.0)
Monocytes Relative: 12 % (ref 3–12)
NEUTROS PCT: 54 % (ref 43–77)
Neutro Abs: 2.4 10*3/uL (ref 1.7–7.7)
Platelets: 137 10*3/uL — ABNORMAL LOW (ref 150–400)
RBC: 3.44 MIL/uL — ABNORMAL LOW (ref 3.87–5.11)
RDW: 14.2 % (ref 11.5–15.5)
WBC: 4.5 10*3/uL (ref 4.0–10.5)

## 2015-01-07 LAB — FECAL LACTOFERRIN, QUANT: FECAL LACTOFERRIN: NEGATIVE

## 2015-01-07 LAB — MAGNESIUM: Magnesium: 2 mg/dL (ref 1.5–2.5)

## 2015-01-07 LAB — OCCULT BLOOD X 1 CARD TO LAB, STOOL
FECAL OCCULT BLD: NEGATIVE
Fecal Occult Bld: NEGATIVE

## 2015-01-07 MED ORDER — MENTHOL 3 MG MT LOZG
1.0000 | LOZENGE | OROMUCOSAL | Status: DC | PRN
  Administered 2015-01-07: 3 mg via ORAL
  Filled 2015-01-07: qty 9

## 2015-01-07 MED ORDER — SODIUM CHLORIDE 0.9 % IV BOLUS (SEPSIS)
500.0000 mL | Freq: Once | INTRAVENOUS | Status: AC
  Administered 2015-01-07: 500 mL via INTRAVENOUS

## 2015-01-07 MED ORDER — GUAIFENESIN-DM 100-10 MG/5ML PO SYRP
5.0000 mL | ORAL_SOLUTION | ORAL | Status: DC | PRN
  Administered 2015-01-07 – 2015-01-10 (×2): 5 mL via ORAL
  Filled 2015-01-07 (×4): qty 5

## 2015-01-07 MED ORDER — SACCHAROMYCES BOULARDII 250 MG PO CAPS
250.0000 mg | ORAL_CAPSULE | Freq: Two times a day (BID) | ORAL | Status: DC
  Administered 2015-01-07 – 2015-01-10 (×7): 250 mg via ORAL
  Filled 2015-01-07 (×10): qty 1

## 2015-01-07 MED ORDER — HYDROCORTISONE 2.5 % RE CREA
TOPICAL_CREAM | Freq: Two times a day (BID) | RECTAL | Status: DC
  Administered 2015-01-08: 1 via TOPICAL
  Administered 2015-01-08 – 2015-01-10 (×3): via TOPICAL
  Filled 2015-01-07 (×2): qty 28.35

## 2015-01-07 MED ORDER — POTASSIUM CHLORIDE 2 MEQ/ML IV SOLN
INTRAVENOUS | Status: DC
  Administered 2015-01-07 – 2015-01-08 (×2): via INTRAVENOUS
  Filled 2015-01-07 (×4): qty 1000

## 2015-01-07 MED ORDER — SODIUM CHLORIDE 0.9 % IV BOLUS (SEPSIS)
250.0000 mL | Freq: Once | INTRAVENOUS | Status: AC
  Administered 2015-01-07: 250 mL via INTRAVENOUS

## 2015-01-07 MED ORDER — DILTIAZEM HCL ER COATED BEADS 180 MG PO CP24
180.0000 mg | ORAL_CAPSULE | Freq: Every day | ORAL | Status: DC
  Administered 2015-01-08 – 2015-01-10 (×3): 180 mg via ORAL
  Filled 2015-01-07 (×4): qty 1

## 2015-01-07 NOTE — Progress Notes (Signed)
Utilization Review completed.  

## 2015-01-07 NOTE — Progress Notes (Addendum)
[facility] PROGRESS NOTE  Ana FillersJudy C Foster ZOX:[id number] DOB: May 02, 1941 DOA: 01/05/2015 PCP: Geraldo PitterBLAND,VEITA J, MD  Assessment/Plan: Active Problems:   Vomiting and diarrhea   Protein-calorie malnutrition, severe    1-Vomiting and diarrhea improving -no acute intra-abdominal findings on imaging -? R inguinal soft tissue findings-benign exam, CT scan of the abdomen negative Likely viral gastroenteritis We have discontinued ciprofloxacin and Flagyl Patient continues to have soft stools, no appetite No real diarrhea, start the patient on florastor  2-CKD , stage III, improving -stage 3 chronically  1.84> 1.50>1.82 Continue IV hydration as patient's appetite continues to be poor  3- HTN -BP soft, holding clonidine Held Cardizem this morning and continue Lopressor  4-Atrial tachycardia Continue diltiazem and Lopressor if possible Continue telemetry   Code Status: full Family Communication: family updated about patient's clinical progress Disposition Plan:  PT/OT eval, anticipate discharge tomorrow    Brief narrative: History of Present Illness:This is a 74 y.o. year old female with significant past medical history of stage 3 CKD, atrial tachycardia, HTN, hypothyroidism presenting with vomiting and diarrhea. Pt reports 7-10 days of recurrent vomiting and diarrhea. Has not been able to hold anything down by mouth. Subjective fevers and chills. No known sick contacts. Has not been able to take medications 2/2 nausea. Emesis and diarrhea NBNB. Mild RLQ tenderness over past 2-3 days.  Presented to ER T 98.3, HR 60s-70s, resp 10s, BP 130s-160s, satting 96% on RA. CBC and CMET WNL apart from Cr 1.84 (baseline 2.2). CT abd and pelvis with no acute intra-abdominal findings. ? Stranding R inguinal soft tissues.    Consultants:  None  Procedures:  None  Antibiotics: Cipro and Flagyl discontinued  HPI/Subjective: No diarrhea vomiting since admission, afebrile She is  tolerating her diet, but complains of lack of appetite  Objective: Filed Vitals:   01/07/15 0645 01/07/15 0917 01/07/15 1036 01/07/15 1100  BP: 96/46 98/43 103/37   Pulse: 60 68 65   Temp: 98.1 F (36.7 C)  98.4 F (36.9 C)   TempSrc: Oral  Oral   Resp: 17     Height:      Weight:      SpO2: 100%  100% 99%    Intake/Output Summary (Last 24 hours) at 01/07/15 1211 Last data filed at 01/07/15 0946  Gross per 24 hour  Intake 2926.67 ml  Output    250 ml  Net 2676.67 ml    Exam:  General: No acute respiratory distress Lungs: Clear to auscultation bilaterally without wheezes or crackles Cardiovascular: Regular rate and rhythm without murmur gallop or rub normal S1 and S2 Abdomen: Nontender, nondistended, soft, bowel sounds positive, no rebound, no ascites, no appreciable mass Extremities: No significant cyanosis, clubbing, or edema bilateral lower extremities      Data Reviewed: Basic Metabolic Panel:  Recent Labs Lab 01/05/15 1440 01/06/15 0535 01/07/15 0711 01/07/15 0930  NA 139 139 141  --   K 3.7 3.5 3.8  --   CL 102 109 115*  --   CO2 24 26 22   --   GLUCOSE 90 86 121*  --   BUN 33* 23 20  --   CREATININE 1.84* 1.51* 1.82*  --   CALCIUM 9.7 8.4 9.2  --   MG  --   --   --  2.0    Liver Function Tests:  Recent Labs Lab 01/05/15 1440 01/06/15 0535 01/07/15 0711  AST 21 13 16   ALT 14 11 9   ALKPHOS 70 46 68  BILITOT 1.0 0.7 0.4  PROT 7.8 5.4* 5.7*  ALBUMIN 4.1 2.8* 3.0*    Recent Labs Lab 01/05/15 1440  LIPASE 95*   No results for input(s): AMMONIA in the last 168 hours.  CBC:  Recent Labs Lab 01/05/15 1440 01/06/15 0535 01/07/15 0711  WBC 6.7 3.3* 4.5  NEUTROABS 4.5 1.8 2.4  HGB 13.7 10.1* 10.8*  HCT 40.3 30.4* 33.0*  MCV 94.2 95.0 95.9  PLT 216 120* 137*    Cardiac Enzymes: No results for input(s): CKTOTAL, CKMB, CKMBINDEX, TROPONINI in the last 168 hours. BNP (last 3 results) No results for input(s): BNP in the last 8760  hours.  ProBNP (last 3 results) No results for input(s): PROBNP in the last 8760 hours.    CBG:  Recent Labs Lab 01/06/15 0730  GLUCAP 81    No results found for this or any previous visit (from the past 240 hour(s)).   Studies: Ct Abdomen Pelvis Wo Contrast  01/05/2015   CLINICAL DATA:  Low abdominal pain for 1 week  EXAM: CT ABDOMEN AND PELVIS WITHOUT CONTRAST  TECHNIQUE: Multidetector CT imaging of the abdomen and pelvis was performed following the standard protocol without IV contrast.  COMPARISON:  02/11/2010  FINDINGS: Lower chest:  Lung bases are clear.  Hepatobiliary: Unenhanced liver and gallbladder appear unremarkable.  Pancreas: Normal  Spleen: Normal  Adrenals/Urinary Tract: Adrenal glands are unremarkable. Mild lobulation to the renal cortical contour bilaterally without hydroureteronephrosis. No radiopaque renal or ureteral calculus. The bladder is decompressed but unremarkable.  Stomach/Bowel: Stomach and bowel appear normal.  Vascular/Lymphatic: Mild atheromatous aortic calcification without aneurysm. No lymphadenopathy.  Other: Minimal haziness at the root of the small bowel mesentery is nonspecific but stable since 2011. No ascites or free air. Small fat containing left inguinal hernia. Uterus presumed surgically absent. No adnexal mass.  Musculoskeletal: Minimal stranding is noted in the region of the right groin, correlate clinically for any evidence of cellulitis or other skin abnormality. S shaped thoracolumbar scoliosis is identified. No acute abnormality.  IMPRESSION: No acute intra-abdominal or pelvic pathology.  Minimal stranding in the right inguinal subcutaneous soft tissues, correlate clinically for any evidence of cellulitis or other dermal abnormality.   Electronically Signed   By: Christiana Pellant M.D.   On: 01/05/2015 17:44   Dg Chest 2 View  12/27/2014   CLINICAL DATA:  Initial evaluation for acute altered mental status, fall.  EXAM: CHEST  2 VIEW  COMPARISON:   Prior study from 06/28/2014  FINDINGS: Cardiac and mediastinal silhouettes are stable in size and contour, and remain within normal limits.  Lungs are mildly hypoinflated. No focal infiltrate, pulmonary edema, or pleural effusion. There is no pneumothorax.  Scoliosis noted.  No acute osseus abnormality.  IMPRESSION: No active cardiopulmonary disease.   Electronically Signed   By: Rise Mu M.D.   On: 12/27/2014 00:27   Dg Pelvis 1-2 Views  12/27/2014   CLINICAL DATA:  Initial valuation for acute altered mental status. Fall.  EXAM: PELVIS - 1-2 VIEW  COMPARISON:  None.  FINDINGS: The femoral heads are normally positioned within the acetabula. Femoral head heights are preserved. No acute fracture or dislocation. Linear lucency traversing the subcapital region of the left femur is stable from prior. No cortical disruption to suggest acute fracture. No pubic diastasis. SI joints are approximated.  Degenerative osteoarthrosis noted about the hips bilaterally. Degenerative changes also noted within the lower lumbar spine.  No acute soft tissue abnormality.  IMPRESSION: No acute fracture or  dislocation within the pelvis.   Electronically Signed   By: Rise Mu M.D.   On: 12/27/2014 00:31   Ct Head Wo Contrast  12/27/2014   CLINICAL DATA:  Initial evaluation for altered mental status  EXAM: CT HEAD WITHOUT CONTRAST  TECHNIQUE: Contiguous axial images were obtained from the base of the skull through the vertex without intravenous contrast.  COMPARISON:  Prior study from 11/12/2014  FINDINGS: Diffuse prominence of the CSF containing spaces is compatible with generalized cerebral atrophy. Patchy and confluent hypodensity within the periventricular deep white matter most consistent with chronic small vessel ischemic disease.  No acute large vessel territory infarct. No mass lesion or midline shift. No intracranial hemorrhage. Basal ganglia calcifications noted. No extra-axial fluid collection. No  hydrocephalus.  No acute abnormality seen about the orbits. Scalp soft tissues within normal limits.  Calvarium intact. Paranasal sinuses are clear. No mastoid effusion.  IMPRESSION: 1. No acute intracranial process. 2. Atrophy with advanced chronic microvascular ischemic disease.   Electronically Signed   By: Rise Mu M.D.   On: 12/27/2014 00:21    Scheduled Meds: . budesonide-formoterol  2 puff Inhalation BID  . calcitRIOL  0.25 mcg Oral Q M,W,F  . clonazePAM  1 mg Oral QHS  . diltiazem  180 mg Oral Daily  . feeding supplement (ENSURE COMPLETE)  237 mL Oral BID BM  . ferrous fumarate  1 tablet Oral Daily  . heparin  5,000 Units Subcutaneous 3 times per day  . levothyroxine  112 mcg Oral QAC breakfast  . metoprolol tartrate  12.5 mg Oral BID  . ondansetron  4 mg Oral 4 times per day   Or  . ondansetron (ZOFRAN) IV  4 mg Intravenous 4 times per day  . pregabalin  50 mg Oral BID  . sertraline  150 mg Oral Daily  . [START ON 01/09/2015] Vitamin D (Ergocalciferol)  50,000 Units Oral Q7 days   Continuous Infusions: . 0.9 % NaCl with KCl 20 mEq / L 100 mL/hr at 01/06/15 2129    Active Problems:   Vomiting and diarrhea   Protein-calorie malnutrition, severe    Time spent: 40 minutes   [facility]  [facility] Pager [phone]. If 7PM-7AM, please contact night-coverage at [email or web address], password [facility] 01/07/2015, 12:11 PM

## 2015-01-07 NOTE — Progress Notes (Signed)
bp 116/60 hr 73 after 500 cc bolus

## 2015-01-07 NOTE — Progress Notes (Signed)
Pt bp 98/43 hr 68 after 250 cc bolus

## 2015-01-07 NOTE — Progress Notes (Signed)
Pt bp 80/37 hr 69 paged Dr  Ellis ParentsAbrol fyi pt asymptomatic states "feels real tired".  Dr. Susie CassetteAbrol returned page T/O 2D echo, troponin x 1, bolus 500 ccs NS.

## 2015-01-08 ENCOUNTER — Inpatient Hospital Stay (HOSPITAL_COMMUNITY): Payer: Medicare Other

## 2015-01-08 ENCOUNTER — Other Ambulatory Visit: Payer: Self-pay

## 2015-01-08 DIAGNOSIS — R634 Abnormal weight loss: Secondary | ICD-10-CM

## 2015-01-08 DIAGNOSIS — A084 Viral intestinal infection, unspecified: Secondary | ICD-10-CM | POA: Diagnosis present

## 2015-01-08 DIAGNOSIS — M7989 Other specified soft tissue disorders: Secondary | ICD-10-CM | POA: Diagnosis not present

## 2015-01-08 DIAGNOSIS — I471 Supraventricular tachycardia: Secondary | ICD-10-CM | POA: Diagnosis present

## 2015-01-08 DIAGNOSIS — D61818 Other pancytopenia: Secondary | ICD-10-CM | POA: Diagnosis not present

## 2015-01-08 DIAGNOSIS — R197 Diarrhea, unspecified: Secondary | ICD-10-CM

## 2015-01-08 DIAGNOSIS — N183 Chronic kidney disease, stage 3 (moderate): Secondary | ICD-10-CM | POA: Diagnosis present

## 2015-01-08 DIAGNOSIS — N189 Chronic kidney disease, unspecified: Secondary | ICD-10-CM

## 2015-01-08 DIAGNOSIS — K649 Unspecified hemorrhoids: Secondary | ICD-10-CM | POA: Diagnosis present

## 2015-01-08 DIAGNOSIS — E039 Hypothyroidism, unspecified: Secondary | ICD-10-CM | POA: Diagnosis present

## 2015-01-08 DIAGNOSIS — R112 Nausea with vomiting, unspecified: Secondary | ICD-10-CM | POA: Diagnosis not present

## 2015-01-08 DIAGNOSIS — E875 Hyperkalemia: Secondary | ICD-10-CM | POA: Diagnosis present

## 2015-01-08 DIAGNOSIS — D649 Anemia, unspecified: Secondary | ICD-10-CM | POA: Diagnosis not present

## 2015-01-08 DIAGNOSIS — D759 Disease of blood and blood-forming organs, unspecified: Secondary | ICD-10-CM | POA: Diagnosis present

## 2015-01-08 DIAGNOSIS — R109 Unspecified abdominal pain: Secondary | ICD-10-CM | POA: Diagnosis not present

## 2015-01-08 DIAGNOSIS — Z96652 Presence of left artificial knee joint: Secondary | ICD-10-CM | POA: Diagnosis present

## 2015-01-08 DIAGNOSIS — R05 Cough: Secondary | ICD-10-CM | POA: Diagnosis not present

## 2015-01-08 DIAGNOSIS — R0602 Shortness of breath: Secondary | ICD-10-CM | POA: Diagnosis not present

## 2015-01-08 DIAGNOSIS — R9431 Abnormal electrocardiogram [ECG] [EKG]: Secondary | ICD-10-CM

## 2015-01-08 DIAGNOSIS — Z801 Family history of malignant neoplasm of trachea, bronchus and lung: Secondary | ICD-10-CM | POA: Diagnosis not present

## 2015-01-08 DIAGNOSIS — I129 Hypertensive chronic kidney disease with stage 1 through stage 4 chronic kidney disease, or unspecified chronic kidney disease: Secondary | ICD-10-CM | POA: Diagnosis present

## 2015-01-08 DIAGNOSIS — E43 Unspecified severe protein-calorie malnutrition: Secondary | ICD-10-CM | POA: Diagnosis present

## 2015-01-08 DIAGNOSIS — I10 Essential (primary) hypertension: Secondary | ICD-10-CM | POA: Diagnosis not present

## 2015-01-08 DIAGNOSIS — Z6821 Body mass index (BMI) 21.0-21.9, adult: Secondary | ICD-10-CM | POA: Diagnosis not present

## 2015-01-08 LAB — COMPREHENSIVE METABOLIC PANEL
ALT: 7 U/L (ref 0–35)
ANION GAP: 0 — AB (ref 5–15)
AST: 11 U/L (ref 0–37)
Albumin: 2.2 g/dL — ABNORMAL LOW (ref 3.5–5.2)
Alkaline Phosphatase: 46 U/L (ref 39–117)
BUN: 11 mg/dL (ref 6–23)
CALCIUM: 7.5 mg/dL — AB (ref 8.4–10.5)
CO2: 21 mmol/L (ref 19–32)
Chloride: 122 mmol/L — ABNORMAL HIGH (ref 96–112)
Creatinine, Ser: 1.51 mg/dL — ABNORMAL HIGH (ref 0.50–1.10)
GFR calc Af Amer: 39 mL/min — ABNORMAL LOW (ref >90)
GFR, EST NON AFRICAN AMERICAN: 34 mL/min — AB (ref >90)
GLUCOSE: 85 mg/dL (ref 70–99)
Potassium: 5.3 mmol/L — ABNORMAL HIGH (ref 3.5–5.1)
SODIUM: 142 mmol/L (ref 135–145)
TOTAL PROTEIN: 4.3 g/dL — AB (ref 6.0–8.3)
Total Bilirubin: 0.1 mg/dL — ABNORMAL LOW (ref 0.3–1.2)

## 2015-01-08 LAB — CBC WITH DIFFERENTIAL/PLATELET
BASOS ABS: 0 10*3/uL (ref 0.0–0.1)
Basophils Relative: 0 % (ref 0–1)
Eosinophils Absolute: 0.1 10*3/uL (ref 0.0–0.7)
Eosinophils Relative: 2 % (ref 0–5)
HEMATOCRIT: 26.8 % — AB (ref 36.0–46.0)
HEMOGLOBIN: 8.7 g/dL — AB (ref 12.0–15.0)
LYMPHS PCT: 33 % (ref 12–46)
Lymphs Abs: 1 10*3/uL (ref 0.7–4.0)
MCH: 31.9 pg (ref 26.0–34.0)
MCHC: 32.5 g/dL (ref 30.0–36.0)
MCV: 98.2 fL (ref 78.0–100.0)
MONO ABS: 0.3 10*3/uL (ref 0.1–1.0)
MONOS PCT: 11 % (ref 3–12)
Neutro Abs: 1.5 10*3/uL — ABNORMAL LOW (ref 1.7–7.7)
Neutrophils Relative %: 54 % (ref 43–77)
Platelets: 101 10*3/uL — ABNORMAL LOW (ref 150–400)
RBC: 2.73 MIL/uL — AB (ref 3.87–5.11)
RDW: 14.4 % (ref 11.5–15.5)
WBC: 2.9 10*3/uL — ABNORMAL LOW (ref 4.0–10.5)

## 2015-01-08 LAB — LACTATE DEHYDROGENASE: LDH: 135 U/L (ref 94–250)

## 2015-01-08 LAB — TROPONIN I
Troponin I: <0.03 ng/mL (ref <0.031)
Troponin I: <0.03 ng/mL (ref <0.031)

## 2015-01-08 LAB — SAVE SMEAR

## 2015-01-08 LAB — INFLUENZA PANEL BY PCR (TYPE A & B)
H1N1 flu by pcr: NOT DETECTED
INFLAPCR: NEGATIVE
INFLBPCR: NEGATIVE

## 2015-01-08 LAB — RAPID STREP SCREEN (MED CTR MEBANE ONLY): STREPTOCOCCUS, GROUP A SCREEN (DIRECT): NEGATIVE

## 2015-01-08 MED ORDER — SODIUM POLYSTYRENE SULFONATE 15 GM/60ML PO SUSP: 15.0000 g | Freq: Once | ORAL | Status: DC

## 2015-01-08 MED ORDER — HYDROCODONE-HOMATROPINE 5-1.5 MG/5ML PO SYRP
5.0000 mL | ORAL_SOLUTION | Freq: Four times a day (QID) | ORAL | Status: DC
  Administered 2015-01-08 – 2015-01-10 (×4): 5 mL via ORAL
  Filled 2015-01-08 (×5): qty 5

## 2015-01-08 MED ORDER — SODIUM CHLORIDE 0.9 % IV SOLN
INTRAVENOUS | Status: DC
  Administered 2015-01-08: 75 mL/h via INTRAVENOUS
  Administered 2015-01-09 (×2): via INTRAVENOUS

## 2015-01-08 NOTE — Progress Notes (Signed)
  Echocardiogram 2D Echocardiogram has been performed.  [name] FRANCES 01/08/2015, 9:29 AM

## 2015-01-08 NOTE — Progress Notes (Addendum)
[facility] PROGRESS NOTE  Ana FillersJudy C Foster ZOX:[id number] DOB: 06-Feb-1941 DOA: 01/05/2015 PCP: Geraldo PitterBLAND,VEITA J, MD  Assessment/Plan: Active Problems:   Vomiting and diarrhea   Protein-calorie malnutrition, severe    1-Vomiting and diarrhea improving -no acute intra-abdominal findings on imaging -? R inguinal soft tissue findings-benign exam, CT scan of the abdomen negative Likely viral gastroenteritis We have discontinued ciprofloxacin and Flagyl Patient continues to have soft stools, no appetite No real diarrhea, start the patient on florastor Continue florastor, repeat c diff    2-CKD , stage III, improving -stage 3 chronically  1.84> 1.50>1.82>1.51 Continue IV hydration as patient's appetite continues to be poor  3- HTN continues to be intermittently hypotensive -BP soft, holding clonidine Patient received multiple fluid boluses We'll check troponin and 2-D echo  4-Atrial tachycardia Continue diltiazem and Lopressor if possible Continue telemetry  5. Pancytopenia Could be secondary to a viral infection HIT panel for thrombocytopenia, heparin discontinued, screening ultrasound to rule out DVT Will also obtain a peripheral smear and LDH Discussed with Dr. Darnelle CatalanMagrinat 2 consult Stool guaiac negative  Cough  Still has a cough  Check for rapid strep and flu  cxr   6. Hemorrhoids Started patient on Anusol HC  7 hyperkalemia We will give 1 dose of Kayexalate . Code Status: full Family Communication: family updated about patient's clinical progress Disposition Plan:  PT/OT eval, anticipate discharge tomorrow    Brief narrative: History of Present Illness:This is a 74 y.o. year old female with significant past medical history of stage 3 CKD, atrial tachycardia, HTN, hypothyroidism presenting with vomiting and diarrhea. Pt reports 7-10 days of recurrent vomiting and diarrhea. Has not been able to hold anything down by mouth. Subjective fevers and chills. No  known sick contacts. Has not been able to take medications 2/2 nausea. Emesis and diarrhea NBNB. Mild RLQ tenderness over past 2-3 days.  Presented to ER T 98.3, HR 60s-70s, resp 10s, BP 130s-160s, satting 96% on RA. CBC and CMET WNL apart from Cr 1.84 (baseline 2.2). CT abd and pelvis with no acute intra-abdominal findings. ? Stranding R inguinal soft tissues.    Consultants:  None  Procedures:  None  Antibiotics: Cipro and Flagyl discontinued  HPI/Subjective: Hypotensive last night and received a bolus of 500 mL Blood pressure will improve this morning Diarrhea 3-4 times today    Objective: Filed Vitals:   01/07/15 2006 01/08/15 0457 01/08/15 0500 01/08/15 0939  BP: 102/45 117/52  122/51  Pulse: 76 69  79  Temp: 98.6 F (37 C) 98 F (36.7 C)  98.1 F (36.7 C)  TempSrc: Oral Oral  Oral  Resp: 17 16  17   Height:      Weight: 60.8 kg (134 lb 0.6 oz)  63.5 kg (139 lb 15.9 oz)   SpO2: 98% 98%  100%    Intake/Output Summary (Last 24 hours) at 01/08/15 1207 Last data filed at 01/08/15 0940  Gross per 24 hour  Intake   1080 ml  Output    800 ml  Net    280 ml    Exam:  General: No acute respiratory distress Lungs: Clear to auscultation bilaterally without wheezes or crackles Cardiovascular: Regular rate and rhythm without murmur gallop or rub normal S1 and S2 Abdomen: Nontender, nondistended, soft, bowel sounds positive, no rebound, no ascites, no appreciable mass Extremities: No significant cyanosis, clubbing, or edema bilateral lower extremities      Data Reviewed: Basic Metabolic Panel:  Recent Labs Lab 01/05/15 1440 01/06/15 0535  01/07/15 0711 01/07/15 0930 01/08/15 0624  NA 139 139 141  --  142  K 3.7 3.5 3.8  --  5.3*  CL 102 109 115*  --  122*  CO2 --  21  GLUCOSE 90 86 121*  --  85  BUN 33* 23 20  --  11  CREATININE 1.84* 1.51* 1.82*  --  1.51*  CALCIUM 9.7 8.4 9.2  --  7.5*  MG  --   --   --  2.0  --     Liver Function  Tests:  Recent Labs Lab 01/05/15 1440 01/06/15 0535 01/07/15 0711 01/08/15 0624  AST ALT ALKPHOS 70 46 68 46  BILITOT 1.0 0.7 0.4 0.1*  PROT 7.8 5.4* 5.7* 4.3*  ALBUMIN 4.1 2.8* 3.0* 2.2*    Recent Labs Lab 01/05/15 1440  LIPASE 95*   No results for input(s): AMMONIA in the last 168 hours.  CBC:  Recent Labs Lab 01/05/15 1440 01/06/15 0535 01/07/15 0711 01/08/15 0624  WBC 6.7 3.3* 4.5 2.9*  NEUTROABS 4.5 1.8 2.4 1.5*  HGB 13.7 10.1* 10.8* 8.7*  HCT 40.3 30.4* 33.0* 26.8*  MCV 94.2 95.0 95.9 98.2  PLT 216 120* 137* 101*    Cardiac Enzymes: No results for input(s): CKTOTAL, CKMB, CKMBINDEX, TROPONINI in the last 168 hours. BNP (last 3 results) No results for input(s): BNP in the last 8760 hours.  ProBNP (last 3 results) No results for input(s): PROBNP in the last 8760 hours.    CBG:  Recent Labs Lab 01/06/15 0730  GLUCAP 81    No results found for this or any previous visit (from the past 240 hour(s)).   Studies: Ct Abdomen Pelvis Wo Contrast  01/05/2015   CLINICAL DATA:  Low abdominal pain for 1 week  EXAM: CT ABDOMEN AND PELVIS WITHOUT CONTRAST  TECHNIQUE: Multidetector CT imaging of the abdomen and pelvis was performed following the standard protocol without IV contrast.  COMPARISON:  02/11/2010  FINDINGS: Lower chest:  Lung bases are clear.  Hepatobiliary: Unenhanced liver and gallbladder appear unremarkable.  Pancreas: Normal  Spleen: Normal  Adrenals/Urinary Tract: Adrenal glands are unremarkable. Mild lobulation to the renal cortical contour bilaterally without hydroureteronephrosis. No radiopaque renal or ureteral calculus. The bladder is decompressed but unremarkable.  Stomach/Bowel: Stomach and bowel appear normal.  Vascular/Lymphatic: Mild atheromatous aortic calcification without aneurysm. No lymphadenopathy.  Other: Minimal haziness at the root of the small bowel mesentery is nonspecific but stable since 2011. No ascites  or free air. Small fat containing left inguinal hernia. Uterus presumed surgically absent. No adnexal mass.  Musculoskeletal: Minimal stranding is noted in the region of the right groin, correlate clinically for any evidence of cellulitis or other skin abnormality. S shaped thoracolumbar scoliosis is identified. No acute abnormality.  IMPRESSION: No acute intra-abdominal or pelvic pathology.  Minimal stranding in the right inguinal subcutaneous soft tissues, correlate clinically for any evidence of cellulitis or other dermal abnormality.   Electronically Signed   By: Christiana Pellant M.D.   On: 01/05/2015 17:44   Dg Chest 2 View  12/27/2014   CLINICAL DATA:  Initial evaluation for acute altered mental status, fall.  EXAM: CHEST  2 VIEW  COMPARISON:  Prior study from 06/28/2014  FINDINGS: Cardiac and mediastinal silhouettes are stable in size and contour, and remain within normal limits.  Lungs are mildly hypoinflated. No focal infiltrate, pulmonary edema, or pleural effusion. There is  no pneumothorax.  Scoliosis noted.  No acute osseus abnormality.  IMPRESSION: No active cardiopulmonary disease.   Electronically Signed   By: Rise Mu M.D.   On: 12/27/2014 00:27   Dg Pelvis 1-2 Views  12/27/2014   CLINICAL DATA:  Initial valuation for acute altered mental status. Fall.  EXAM: PELVIS - 1-2 VIEW  COMPARISON:  None.  FINDINGS: The femoral heads are normally positioned within the acetabula. Femoral head heights are preserved. No acute fracture or dislocation. Linear lucency traversing the subcapital region of the left femur is stable from prior. No cortical disruption to suggest acute fracture. No pubic diastasis. SI joints are approximated.  Degenerative osteoarthrosis noted about the hips bilaterally. Degenerative changes also noted within the lower lumbar spine.  No acute soft tissue abnormality.  IMPRESSION: No acute fracture or dislocation within the pelvis.   Electronically Signed   By: Rise Mu M.D.   On: 12/27/2014 00:31   Ct Head Wo Contrast  12/27/2014   CLINICAL DATA:  Initial evaluation for altered mental status  EXAM: CT HEAD WITHOUT CONTRAST  TECHNIQUE: Contiguous axial images were obtained from the base of the skull through the vertex without intravenous contrast.  COMPARISON:  Prior study from 11/12/2014  FINDINGS: Diffuse prominence of the CSF containing spaces is compatible with generalized cerebral atrophy. Patchy and confluent hypodensity within the periventricular deep white matter most consistent with chronic small vessel ischemic disease.  No acute large vessel territory infarct. No mass lesion or midline shift. No intracranial hemorrhage. Basal ganglia calcifications noted. No extra-axial fluid collection. No hydrocephalus.  No acute abnormality seen about the orbits. Scalp soft tissues within normal limits.  Calvarium intact. Paranasal sinuses are clear. No mastoid effusion.  IMPRESSION: 1. No acute intracranial process. 2. Atrophy with advanced chronic microvascular ischemic disease.   Electronically Signed   By: Rise Mu M.D.   On: 12/27/2014 00:21    Scheduled Meds: . budesonide-formoterol  2 puff Inhalation BID  . calcitRIOL  0.25 mcg Oral Q M,W,F  . clonazePAM  1 mg Oral QHS  . diltiazem  180 mg Oral Daily  . feeding supplement (ENSURE COMPLETE)  237 mL Oral BID BM  . hydrocortisone   Topical BID  . levothyroxine  112 mcg Oral QAC breakfast  . metoprolol tartrate  12.5 mg Oral BID  . ondansetron  4 mg Oral 4 times per day   Or  . ondansetron (ZOFRAN) IV  4 mg Intravenous 4 times per day  . pregabalin  50 mg Oral BID  . saccharomyces boulardii  250 mg Oral BID  . sertraline  150 mg Oral Daily  . sodium polystyrene  15 g Oral Once  . [START ON 01/09/2015] Vitamin D (Ergocalciferol)  50,000 Units Oral Q7 days   Continuous Infusions: . sodium chloride      Active Problems:   Vomiting and diarrhea   Protein-calorie malnutrition,  severe    Time spent: 40 minutes   [facility]  [facility] Pager [phone]. If 7PM-7AM, please contact night-coverage at [email or web address], password [facility] 01/08/2015, 12:07 PM

## 2015-01-08 NOTE — Plan of Care (Signed)
Problem: Phase I Progression Outcomes Goal: Pain controlled with appropriate interventions Outcome: Not Progressing Patient states she is having abdominal pain 8 out of 10.

## 2015-01-08 NOTE — Progress Notes (Signed)
[name]  [phone] [phone]     ID: [name] DOB: [birth year]  MR#: [id number]  QJF#:[id number]  Patient Care Team: Lucianne Lei, MD as PCP - General (Family Medicine) PCP: Elyn Peers, MD GYN: SU:  OTHER MD: Lonni Fix MD, Donato Heinz MD. Rutherford Guys MD  CHIEF COMPLAINT: nausea, vomiting and diarrhea, persistent, with moderate pancytopenia  CURRENT TREATMENT: supportive   HISTORY OF PRESENT ILLNESS: theophylline patient tells me she had nausea, vomiting and diarrhea for 3 weeks at home before seeking medical attention. She was seen in the ED 12/26/2014 with altered mental status, possibly due to benzodiazepines. At that point the WBC/ Hb / platelet cunts were 3.0/ 9.7 / 106. These had been normal 10/29 2015 (5.4/ 12.8 /170). On 01/05/2015 she returned to the ED with complaints as above, and at this point initial CBC was normal (6.7 / 13.7 /216). It had dropped within 24 hours, partly at least due to rehydration (3.3 / 10.1 / 120). They have continued to drift down, so that today the values were 2.9 / 8.7 / 101. Hematology was consulted for further evaluation  REVIEW OF SYSTEMS: She is still very weak. She had an episode or urinary incontinence during my visit. She also had more nausea, though no vomiting. She "keeps a headache" but thisd is not more intense or persistent that before. She has had a little cough today. She denies fever, rash or bleeding and her BMs have been brown, not black or bloody. She has aches and pains here and there but again not more persistent opr intense than before and she denies focal joint swelling. A detailed review of systems was otherwise noncontributory  PAST MEDICAL HISTORY: Past Medical History  Diagnosis Date  . Hypertension   . Abnormal heart rhythm   . Osteoporosis   . Scoliosis   . Afib     h/o  . Reactive airway disease   . Depression   . Echocardiogram abnormal 02/26/11   MVP,mild MR,AOV mildly sclerotic. EF >55%  . Dementia   . Chronic kidney disease (CKD), stage III (moderate)     Archie Endo 01/05/2015  . Hypothyroidism   . Sleep apnea     "suppose to wear a mask; can't tolerate it" (01/05/2015)  . GERD (gastroesophageal reflux disease)   . Arthritis     "hips, shoulders" (01/05/2015)  . Anxiety     PAST SURGICAL HISTORY: Past Surgical History  Procedure Laterality Date  . Tubal ligation    . Cesarean section  1967  . Cesarean section  1971  . Open reduction internal fixation (orif) distal radial fracture Left 12/2009    Archie Endo 12/28/2009  . Total knee arthroplasty Left 03/2010    Archie Endo 04/07/2010  . Joint replacement    . Vaginal hysterectomy  1980  . Dilation and curettage of uterus    . Cataract extraction, bilateral Bilateral     FAMILY HISTORY Family History  Problem Relation Age of Onset  . Heart disease Father   . Cancer Father   . Cancer Mother   father died from lung cancer age 72, mother same age 72; both were smokers. She had 3 brothers, no sisters. No other cancer in the family  GYNECOLOGIC HISTORY:  No LMP recorded. Patient has had a hysterectomy. 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 She is divorced and lives alone with her chihuahua. Daughter Lenoard Aden works in [employer]. Son Dan Europe fixes vending machines. The patient just welcomed her 3d grandchild. She is not a church attender    ADVANCED DIRECTIVES:    HEALTH MAINTENANCE: History  Substance Use Topics  . Smoking status: Never Smoker   . Smokeless tobacco: Never Used  . Alcohol Use: No     Colonoscopy:  PAP:  Bone density:  Lipid panel:  No Known Allergies  Current Facility-Administered Medications  Medication Dose Route Frequency Provider Last Rate Last Dose  . 0.9 %  sodium chloride infusion   Intravenous  Continuous Reyne Dumas, MD 75 mL/hr at 01/08/15 1213 75 mL/hr at 01/08/15 1213  . acetaminophen (TYLENOL) tablet 650 mg  650 mg Oral Q6H PRN Reyne Dumas, MD   650 mg at 01/06/15 1835  . budesonide-formoterol (SYMBICORT) 80-4.5 MCG/ACT inhaler 2 puff  2 puff Inhalation BID Reyne Dumas, MD   2 puff at 01/07/15 2031  . calcitRIOL (ROCALTROL) capsule 0.25 mcg  0.25 mcg Oral Q M,W,F Shanda Howells, MD   0.25 mcg at 01/06/15 1052  . clonazePAM (KLONOPIN) tablet 1 mg  1 mg Oral QHS Shanda Howells, MD   1 mg at 01/07/15 2031  . diltiazem (CARDIZEM CD) 24 hr capsule 180 mg  180 mg Oral Daily Reyne Dumas, MD   180 mg at 01/08/15 0939  . diltiazem (CARDIZEM) tablet 30 mg  30 mg Oral Q6H PRN Shanda Howells, MD      . feeding supplement (ENSURE COMPLETE) (ENSURE COMPLETE) liquid 237 mL  237 mL Oral BID BM Kallie Locks, RD   237 mL at 01/08/15 1030  . guaiFENesin-dextromethorphan (ROBITUSSIN DM) 100-10 MG/5ML syrup 5 mL  5 mL Oral Q4H PRN Gardiner Barefoot, NP   5 mL at 01/07/15 1610  . HYDROcodone-homatropine (HYCODAN) 5-1.5 MG/5ML syrup 5 mL  5 mL Oral QID Reyne Dumas, MD      . hydrocortisone (ANUSOL-HC) 2.5 % rectal cream   Topical BID Dionne Milo, NP   1 application at 96/04/54 [phone]  . levothyroxine (SYNTHROID, LEVOTHROID) tablet 112 mcg  112 mcg Oral QAC breakfast Shanda Howells, MD   112 mcg at 01/08/15 0940  . menthol-cetylpyridinium (CEPACOL) lozenge 3 mg  1 lozenge Oral PRN Gardiner Barefoot, NP   3 mg at 01/07/15 1410  . metoprolol tartrate (LOPRESSOR) tablet 12.5 mg  12.5 mg Oral BID Shanda Howells, MD   12.5 mg at 01/08/15 0940  . ondansetron (ZOFRAN) tablet 4 mg  4 mg Oral 4 times per day Shanda Howells, MD   4 mg at 01/07/15 1803   Or  . ondansetron [facility]) injection 4 mg  4 mg Intravenous 4 times per day Shanda Howells, MD   4 mg at 01/08/15 1213  . oxyCODONE (Oxy IR/ROXICODONE) immediate release tablet 5 mg  5 mg Oral Q6H PRN Gardiner Barefoot, NP   5 mg at 01/06/15 2329  .  pregabalin (LYRICA) capsule 50 mg  50 mg Oral BID Shanda Howells, MD   50 mg at 01/08/15 0940  . rOPINIRole (REQUIP) tablet 0.5 mg  0.5 mg Oral QHS PRN Shanda Howells, MD   0.5 mg at 01/08/15 0941  . saccharomyces boulardii (FLORASTOR) capsule 250 mg  250 mg Oral BID Reyne Dumas, MD   250 mg at 01/08/15 0940  . sertraline (ZOLOFT) tablet 150 mg  150 mg Oral Daily Shanda Howells, MD   150  mg at 01/08/15 0940  . sodium polystyrene (KAYEXALATE) 15 GM/60ML suspension 15 g  15 g Oral Once Reyne Dumas, MD      . Derrill Memo ON 01/09/2015] Vitamin D (Ergocalciferol) (DRISDOL) capsule 50,000 Units  50,000 Units Oral Q7 days Shanda Howells, MD        OBJECTIVE: older White woman examined in bed Filed Vitals:   01/08/15 0939  BP: 122/51  Pulse: 79  Temp: 98.1 F (36.7 C)  Resp: 17     Body mass index is 24.8 kg/(m^2).    ECOG FS:2 - Symptomatic, <50% confined to bed  Lungs clear, auscultated anterlaterlly Heart RRR abd soft, NT, +BS, no masses palpated MSK: no obvious inflamed joints Neuro nonfocal  LAB RESULTS:  CMP     Component Value Date/Time   NA 142 01/08/2015 0624   K 5.3* 01/08/2015 0624   CL 122* 01/08/2015 0624   CO2 21 01/08/2015 0624   GLUCOSE 85 01/08/2015 0624   BUN 11 01/08/2015 0624   CREATININE 1.51* 01/08/2015 0624   CREATININE 2.28* 09/15/2014 1224   CALCIUM 7.5* 01/08/2015 0624   PROT 4.3* 01/08/2015 0624   ALBUMIN 2.2* 01/08/2015 0624   AST 11 01/08/2015 0624   ALT 7 01/08/2015 0624   ALKPHOS 46 01/08/2015 0624   BILITOT 0.1* 01/08/2015 0624   GFRNONAA 34* 01/08/2015 0624   GFRAA 39* 01/08/2015 0624    INo results found for: SPEP, UPEP  Lab Results  Component Value Date   WBC 2.9* 01/08/2015   NEUTROABS 1.5* 01/08/2015   HGB 8.7* 01/08/2015   HCT 26.8* 01/08/2015   MCV 98.2 01/08/2015   PLT 101* 01/08/2015    _0 @  No results found for: LABCA2  No components found for: LZJQB341  No results for input(s): INR in the last 168  hours.  Urinalysis    Component Value Date/Time   COLORURINE YELLOW 01/05/2015 1430   APPEARANCEUR CLOUDY* 01/05/2015 1430   LABSPEC 1.025 01/05/2015 1430   PHURINE 5.5 01/05/2015 1430   GLUCOSEU NEGATIVE 01/05/2015 1430   HGBUR NEGATIVE 01/05/2015 1430   BILIRUBINUR LARGE* 01/05/2015 1430   KETONESUR 15* 01/05/2015 1430   PROTEINUR NEGATIVE 01/05/2015 1430   UROBILINOGEN 1.0 01/05/2015 1430   NITRITE NEGATIVE 01/05/2015 1430   LEUKOCYTESUR NEGATIVE 01/05/2015 1430    STUDIES: Ct Abdomen Pelvis Wo Contrast  01/05/2015   CLINICAL DATA:  Low abdominal pain for 1 week  EXAM: CT ABDOMEN AND PELVIS WITHOUT CONTRAST  TECHNIQUE: Multidetector CT imaging of the abdomen and pelvis was performed following the standard protocol without IV contrast.  COMPARISON:  02/11/2010  FINDINGS: Lower chest:  Lung bases are clear.  Hepatobiliary: Unenhanced liver and gallbladder appear unremarkable.  Pancreas: Normal  Spleen: Normal  Adrenals/Urinary Tract: Adrenal glands are unremarkable. Mild lobulation to the renal cortical contour bilaterally without hydroureteronephrosis. No radiopaque renal or ureteral calculus. The bladder is decompressed but unremarkable.  Stomach/Bowel: Stomach and bowel appear normal.  Vascular/Lymphatic: Mild atheromatous aortic calcification without aneurysm. No lymphadenopathy.  Other: Minimal haziness at the root of the small bowel mesentery is nonspecific but stable since 2011. No ascites or free air. Small fat containing left inguinal hernia. Uterus presumed surgically absent. No adnexal mass.  Musculoskeletal: Minimal stranding is noted in the region of the right groin, correlate clinically for any evidence of cellulitis or other skin abnormality. S shaped thoracolumbar scoliosis is identified. No acute abnormality.  IMPRESSION: No acute intra-abdominal or pelvic pathology.  Minimal stranding in the right inguinal subcutaneous soft  tissues, correlate clinically for any evidence of  cellulitis or other dermal abnormality.   Electronically Signed   By: Conchita Paris M.D.   On: 01/05/2015 17:44   Dg Chest 2 View  12/27/2014   CLINICAL DATA:  Initial evaluation for acute altered mental status, fall.  EXAM: CHEST  2 VIEW  COMPARISON:  Prior study from 06/28/2014  FINDINGS: Cardiac and mediastinal silhouettes are stable in size and contour, and remain within normal limits.  Lungs are mildly hypoinflated. No focal infiltrate, pulmonary edema, or pleural effusion. There is no pneumothorax.  Scoliosis noted.  No acute osseus abnormality.  IMPRESSION: No active cardiopulmonary disease.   Electronically Signed   By: Jeannine Boga M.D.   On: 12/27/2014 00:27   Dg Pelvis 1-2 Views  12/27/2014   CLINICAL DATA:  Initial valuation for acute altered mental status. Fall.  EXAM: PELVIS - 1-2 VIEW  COMPARISON:  None.  FINDINGS: The femoral heads are normally positioned within the acetabula. Femoral head heights are preserved. No acute fracture or dislocation. Linear lucency traversing the subcapital region of the left femur is stable from prior. No cortical disruption to suggest acute fracture. No pubic diastasis. SI joints are approximated.  Degenerative osteoarthrosis noted about the hips bilaterally. Degenerative changes also noted within the lower lumbar spine.  No acute soft tissue abnormality.  IMPRESSION: No acute fracture or dislocation within the pelvis.   Electronically Signed   By: Jeannine Boga M.D.   On: 12/27/2014 00:31   Ct Head Wo Contrast  12/27/2014   CLINICAL DATA:  Initial evaluation for altered mental status  EXAM: CT HEAD WITHOUT CONTRAST  TECHNIQUE: Contiguous axial images were obtained from the base of the skull through the vertex without intravenous contrast.  COMPARISON:  Prior study from 11/12/2014  FINDINGS: Diffuse prominence of the CSF containing spaces is compatible with generalized cerebral atrophy. Patchy and confluent hypodensity within the periventricular  deep white matter most consistent with chronic small vessel ischemic disease.  No acute large vessel territory infarct. No mass lesion or midline shift. No intracranial hemorrhage. Basal ganglia calcifications noted. No extra-axial fluid collection. No hydrocephalus.  No acute abnormality seen about the orbits. Scalp soft tissues within normal limits.  Calvarium intact. Paranasal sinuses are clear. No mastoid effusion.  IMPRESSION: 1. No acute intracranial process. 2. Atrophy with advanced chronic microvascular ischemic disease.   Electronically Signed   By: Jeannine Boga M.D.   On: 12/27/2014 00:21    ASSESSMENT: 74 y.o. [place] woman admitted with persistent nausea, vomiting, diarrhea and weight loss, with progressive cytopenias  I have reviewed the peripheral blood film and it shows rouleaux, but no tailed poilylocytes, no schistocytes, no polychromasia and no NRBCs; there were no platelet clumps. There was no left shift in the WBC series.  She has a normal ESR and total bilirubin; LDH and haptoglobin are pending. We do not have a reticulocyte count, ferritin, folate or B-12 and those will be added to tomorrow's labs. CT of the abdomen 01/05/2015 shows n splenomegaly.  PLAN: The pancytopenia is moderate but new. She does have CKD and that could account for the anemia, but not fr the other two lines being low. I do not see evidence of an acute leukemia or MDS on the smear.  Possibly we are dealing with a prolonged viral illness affecting the marrow. Medications also could be responsible, though none of her prescriptions are suggestive. Finally I don't see evidence of a rheumatologic problem either by history or  labs  While we may need to proceed to bone marrow biopsy eventually, I think it would be reasonable to observe another 2-3 weeks. The cytopenias may well resolve on their own if this is due to an infectious problem. However if the counts continue to drop we would proceed to BMBX and  I discussed this with the patient today.   If patient improves enough clinically to allow for d/c I will arrange for utpatient follow up until the hematologic question is answered.  Appreciate cosnulting on this patient![name]  Chauncey Cruel, MD   01/08/2015 2:30 PM Medical Oncology and Hematology [facility] [address] [place], [place] [address] Tel. [phone]    Fax. [phone]

## 2015-01-08 NOTE — Progress Notes (Signed)
UR completed 

## 2015-01-09 DIAGNOSIS — M7989 Other specified soft tissue disorders: Secondary | ICD-10-CM

## 2015-01-09 LAB — CBC WITH DIFFERENTIAL/PLATELET
Basophils Absolute: 0 10*3/uL (ref 0.0–0.1)
Basophils Relative: 0 % (ref 0–1)
EOS ABS: 0.2 10*3/uL (ref 0.0–0.7)
EOS PCT: 4 % (ref 0–5)
HCT: 31.1 % — ABNORMAL LOW (ref 36.0–46.0)
Hemoglobin: 9.9 g/dL — ABNORMAL LOW (ref 12.0–15.0)
Lymphocytes Relative: 33 % (ref 12–46)
Lymphs Abs: 1.4 10*3/uL (ref 0.7–4.0)
MCH: 30.9 pg (ref 26.0–34.0)
MCHC: 31.8 g/dL (ref 30.0–36.0)
MCV: 97.2 fL (ref 78.0–100.0)
MONO ABS: 0.5 10*3/uL (ref 0.1–1.0)
MONOS PCT: 11 % (ref 3–12)
NEUTROS ABS: 2.2 10*3/uL (ref 1.7–7.7)
Neutrophils Relative %: 51 % (ref 43–77)
PLATELETS: 132 10*3/uL — AB (ref 150–400)
RBC: 3.2 MIL/uL — AB (ref 3.87–5.11)
RDW: 14.3 % (ref 11.5–15.5)
WBC: 4.3 10*3/uL (ref 4.0–10.5)

## 2015-01-09 LAB — COMPREHENSIVE METABOLIC PANEL
ALK PHOS: 58 U/L (ref 39–117)
ALT: 12 U/L (ref 0–35)
AST: 16 U/L (ref 0–37)
Albumin: 2.8 g/dL — ABNORMAL LOW (ref 3.5–5.2)
Anion gap: 3 — ABNORMAL LOW (ref 5–15)
BUN: 12 mg/dL (ref 6–23)
CO2: 21 mmol/L (ref 19–32)
Calcium: 8.5 mg/dL (ref 8.4–10.5)
Chloride: 113 mmol/L — ABNORMAL HIGH (ref 96–112)
Creatinine, Ser: 1.62 mg/dL — ABNORMAL HIGH (ref 0.50–1.10)
GFR calc Af Amer: 36 mL/min — ABNORMAL LOW (ref >90)
GFR calc non Af Amer: 31 mL/min — ABNORMAL LOW (ref >90)
GLUCOSE: 92 mg/dL (ref 70–99)
Potassium: 4.3 mmol/L (ref 3.5–5.1)
Sodium: 137 mmol/L (ref 135–145)
Total Bilirubin: 0.3 mg/dL (ref 0.3–1.2)
Total Protein: 5.4 g/dL — ABNORMAL LOW (ref 6.0–8.3)

## 2015-01-09 LAB — FERRITIN: Ferritin: 80 ng/mL (ref 10–291)

## 2015-01-09 LAB — CLOSTRIDIUM DIFFICILE BY PCR: CDIFFPCR: NEGATIVE

## 2015-01-09 LAB — RETICULOCYTES
RBC.: 3.2 MIL/uL — AB (ref 3.87–5.11)
RETIC COUNT ABSOLUTE: 67.2 10*3/uL (ref 19.0–186.0)
RETIC CT PCT: 2.1 % (ref 0.4–3.1)

## 2015-01-09 LAB — VITAMIN B12: Vitamin B-12: 370 pg/mL (ref 211–911)

## 2015-01-09 LAB — HAPTOGLOBIN: HAPTOGLOBIN: 47 mg/dL (ref 34–200)

## 2015-01-09 MED ORDER — DILTIAZEM HCL ER COATED BEADS 180 MG PO CP24: 180.0000 mg | ORAL_CAPSULE | Freq: Every day | ORAL | Status: DC

## 2015-01-09 MED ORDER — HYDROCORTISONE 2.5 % RE CREA: TOPICAL_CREAM | Freq: Two times a day (BID) | RECTAL | Status: DC

## 2015-01-09 MED ORDER — ENSURE COMPLETE PO LIQD: 237.0000 mL | Freq: Two times a day (BID) | ORAL | Status: DC

## 2015-01-09 MED ORDER — SACCHAROMYCES BOULARDII 250 MG PO CAPS: 250.0000 mg | ORAL_CAPSULE | Freq: Two times a day (BID) | ORAL | Status: DC

## 2015-01-09 MED ORDER — HYDROCODONE-HOMATROPINE 5-1.5 MG/5ML PO SYRP: 5.0000 mL | ORAL_SOLUTION | Freq: Four times a day (QID) | ORAL | Status: DC

## 2015-01-09 NOTE — Progress Notes (Signed)
CARE MANAGEMENT NOTE 01/09/2015  Patient:  Rose FillersOBANNON,Gizelle C   Account Number:  [id number]  Date Initiated:  01/09/2015  Documentation initiated by:  [facility]  Subjective/Objective Assessment:   n/v     Action/Plan:   lives with alone, dtr Ignacia MarvelPamela Rice # [phone] assist at home   Anticipated DC Date:  01/10/2015   Anticipated DC Plan:  HOME/SELF CARE      DC Planning Services  CM consult      Choice offered to / List presented to:             Status of service:  Completed, signed off Medicare Important Message given?  YES (If response is "NO", the following Medicare IM given date fields will be blank) Date Medicare IM given:  01/09/2015 Medicare IM given by:  [facility] Date Additional Medicare IM given:   Additional Medicare IM given by:    Discharge Disposition:  HOME/SELF CARE  Per UR Regulation:    If discussed at Long Length of Stay Meetings, dates discussed:    Comments:  01/09/2015 1430 NCM spoke to pt and states she lives at home alone. Dtr, Rinaldo CloudPamela assist with care at home. She has RW and 3n1 at home. No HH PT recommended. Isidoro DonningAlesia Juanmanuel Marohl RN CCM Case Mgmt phone [phone]

## 2015-01-09 NOTE — Progress Notes (Addendum)
[facility] PROGRESS NOTE  JORGE AMPARO ZOX:[id number] DOB: [birth year] DOA: 01/05/2015 PCP: Geraldo Pitter, MD  Assessment/Plan: Active Problems:   Vomiting and diarrhea   Protein-calorie malnutrition, severe    1-Vomiting and diarrhea likely secondary to viral gastroenteritis -no acute intra-abdominal findings on imaging -? R inguinal soft tissue findings-benign exam, CT scan of the abdomen negative Off  ciprofloxacin and Flagyl 1 day Appetite is improving, no further diarrhea Continue florastor C. difficile negative, stool culture negative, Influenza PCR negative  2-CKD , stage III, improving -stage 3 chronically  1.84> 1.50>1.82>1.51>1.62 Continue IV hydration    3- HTN continues to be intermittently hypotensive -BP soft, holding clonidine 2-D echo and troponin within normal limits, grade 2 diastolic dysfunction  4-Atrial tachycardia Continue diltiazem and Lopressor if possible Continue telemetry  5. Pancytopenia Could be secondary to a viral infection HIT panel for thrombocytopenia pending, bilateral upper and lower extremities Dopplers negative Will also obtain a peripheral smear and LDH Discussed with Dr. Darnelle Catalan , appreciate hematology input, consult already improving Vitamin B-12 370, ferritin 80, RBC folate pending Stool guaiac negative  Cough  Still has a cough  Influenza and rapid strep negative Repeat chest x-ray 2/21 negative   6. Hemorrhoids Continue Anusol HC  7 hyperkalemia Resolved . Code Status: full Family Communication: family updated about patient's clinical progress Disposition Plan:  Anticipated to discharge home on 2/23, after hematology makes Final recommendations     Brief narrative: History of Present Illness:This is a 74 y.o. year old female with significant past medical history of stage 3 CKD, atrial tachycardia, HTN, hypothyroidism presenting with vomiting and diarrhea. Pt reports 7-10 days of recurrent vomiting  and diarrhea. Has not been able to hold anything down by mouth. Subjective fevers and chills. No known sick contacts. Has not been able to take medications 2/2 nausea. Emesis and diarrhea NBNB. Mild RLQ tenderness over past 2-3 days.  Presented to ER T 98.3, HR 60s-70s, resp 10s, BP 130s-160s, satting 96% on RA. CBC and CMET WNL apart from Cr 1.84 (baseline 2.2). CT abd and pelvis with no acute intra-abdominal findings. ? Stranding R inguinal soft tissues.    Consultants:  None  Procedures:  None  Antibiotics: Cipro and Flagyl discontinued  HPI/Subjective: Hemodynamically more stable, complaining of a headache, she slept well last night after taking her Klonopin, diarrhea is improving, appetite is improving   Objective: Filed Vitals:   01/08/15 0939 01/08/15 1757 01/08/15 2101 01/09/15 0600  BP: 122/51 113/55 122/63 125/64  Pulse: 79 77 77 62  Temp: 98.1 F (36.7 C) 98.4 F (36.9 C) 98.5 F (36.9 C) 98.7 F (37.1 C)  TempSrc: Oral Oral Oral Oral  Resp: 17 17 16 16   Height:   5\' 3"  (1.6 m)   Weight:   62.78 kg (138 lb 6.5 oz)   SpO2: 100% 98% 97% 98%    Intake/Output Summary (Last 24 hours) at 01/09/15 1238 Last data filed at 01/09/15 0600  Gross per 24 hour  Intake    460 ml  Output    400 ml  Net     60 ml    Exam:  General: No acute respiratory distress Lungs: Clear to auscultation bilaterally without wheezes or crackles Cardiovascular: Regular rate and rhythm without murmur gallop or rub normal S1 and S2 Abdomen: Nontender, nondistended, soft, bowel sounds positive, no rebound, no ascites, no appreciable mass Extremities: No significant cyanosis, clubbing, or edema bilateral lower extremities      Data Reviewed: Basic Metabolic  Panel:  Recent Labs Lab 01/05/15 1440 01/06/15 0535 01/07/15 0711 01/07/15 0930 01/08/15 0624 01/09/15 0646  NA 139 139 141  --  142 137  K 3.7 3.5 3.8  --  5.3* 4.3  CL 102 109 115*  --  122* 113*  CO2 24 26 22   --  21  21  GLUCOSE 90 86 121*  --  85 92  BUN 33* 23 20  --  11 12  CREATININE 1.84* 1.51* 1.82*  --  1.51* 1.62*  CALCIUM 9.7 8.4 9.2  --  7.5* 8.5  MG  --   --   --  2.0  --   --     Liver Function Tests:  Recent Labs Lab 01/05/15 1440 01/06/15 0535 01/07/15 0711 01/08/15 0624 01/09/15 0646  AST 21 13 16 11 16   ALT 14 11 9 7 12   ALKPHOS 70 46 68 46 58  BILITOT 1.0 0.7 0.4 0.1* 0.3  PROT 7.8 5.4* 5.7* 4.3* 5.4*  ALBUMIN 4.1 2.8* 3.0* 2.2* 2.8*    Recent Labs Lab 01/05/15 1440  LIPASE 95*   No results for input(s): AMMONIA in the last 168 hours.  CBC:  Recent Labs Lab 01/05/15 1440 01/06/15 0535 01/07/15 0711 01/08/15 0624 01/09/15 0646  WBC 6.7 3.3* 4.5 2.9* 4.3  NEUTROABS 4.5 1.8 2.4 1.5* 2.2  HGB 13.7 10.1* 10.8* 8.7* 9.9*  HCT 40.3 30.4* 33.0* 26.8* 31.1*  MCV 94.2 95.0 95.9 98.2 97.2  PLT 216 120* 137* 101* 132*    Cardiac Enzymes:  Recent Labs Lab 01/08/15 1050 01/08/15 1850  TROPONINI <0.03 <0.03   BNP (last 3 results) No results for input(s): BNP in the last 8760 hours.  ProBNP (last 3 results) No results for input(s): PROBNP in the last 8760 hours.    CBG:  Recent Labs Lab 01/06/15 0730  GLUCAP 81    Recent Results (from the past 240 hour(s))  Stool culture     Status: None (Preliminary result)   Collection Time: 01/06/15 11:37 PM  Result Value Ref Range Status   Specimen Description STOOL  Final   Special Requests NONE  Final   Culture   Final    NO SUSPICIOUS COLONIES, CONTINUING TO HOLD Performed at [facility]    Report Status PENDING  Incomplete  Rapid strep screen     Status: None   Collection Time: 01/08/15  1:25 PM  Result Value Ref Range Status   Streptococcus, Group A Screen (Direct) NEGATIVE NEGATIVE Final    Comment: (NOTE) A Rapid Antigen test may result negative if the antigen level in the sample is below the detection level of this test. The FDA has not cleared this test as a stand-alone test  therefore the rapid antigen negative result has reflexed to a Group A Strep culture.   Clostridium Difficile by PCR     Status: None   Collection Time: 01/08/15  6:50 PM  Result Value Ref Range Status   C difficile by pcr NEGATIVE NEGATIVE Final     Studies: Ct Abdomen Pelvis Wo Contrast  01/05/2015   CLINICAL DATA:  Low abdominal pain for 1 week  EXAM: CT ABDOMEN AND PELVIS WITHOUT CONTRAST  TECHNIQUE: Multidetector CT imaging of the abdomen and pelvis was performed following the standard protocol without IV contrast.  COMPARISON:  02/11/2010  FINDINGS: Lower chest:  Lung bases are clear.  Hepatobiliary: Unenhanced liver and gallbladder appear unremarkable.  Pancreas: Normal  Spleen: Normal  Adrenals/Urinary Tract: Adrenal glands  are unremarkable. Mild lobulation to the renal cortical contour bilaterally without hydroureteronephrosis. No radiopaque renal or ureteral calculus. The bladder is decompressed but unremarkable.  Stomach/Bowel: Stomach and bowel appear normal.  Vascular/Lymphatic: Mild atheromatous aortic calcification without aneurysm. No lymphadenopathy.  Other: Minimal haziness at the root of the small bowel mesentery is nonspecific but stable since 2011. No ascites or free air. Small fat containing left inguinal hernia. Uterus presumed surgically absent. No adnexal mass.  Musculoskeletal: Minimal stranding is noted in the region of the right groin, correlate clinically for any evidence of cellulitis or other skin abnormality. S shaped thoracolumbar scoliosis is identified. No acute abnormality.  IMPRESSION: No acute intra-abdominal or pelvic pathology.  Minimal stranding in the right inguinal subcutaneous soft tissues, correlate clinically for any evidence of cellulitis or other dermal abnormality.   Electronically Signed   By: Christiana Pellant M.D.   On: 01/05/2015 17:44   Dg Chest 2 View  01/08/2015   CLINICAL DATA:  Shortness of breath, cough, hypertension  EXAM: CHEST  2 VIEW   COMPARISON:  12/27/2014  FINDINGS: Upper normal heart size.  Normal mediastinal contours and pulmonary vascularity.  Lungs clear.  No pleural effusion or pneumothorax.  Bones demineralized.  IMPRESSION: No acute abnormalities.   Electronically Signed   By: Ulyses Southward M.D.   On: 01/08/2015 15:56   Dg Chest 2 View  12/27/2014   CLINICAL DATA:  Initial evaluation for acute altered mental status, fall.  EXAM: CHEST  2 VIEW  COMPARISON:  Prior study from 06/28/2014  FINDINGS: Cardiac and mediastinal silhouettes are stable in size and contour, and remain within normal limits.  Lungs are mildly hypoinflated. No focal infiltrate, pulmonary edema, or pleural effusion. There is no pneumothorax.  Scoliosis noted.  No acute osseus abnormality.  IMPRESSION: No active cardiopulmonary disease.   Electronically Signed   By: Rise Mu M.D.   On: 12/27/2014 00:27   Dg Pelvis 1-2 Views  12/27/2014   CLINICAL DATA:  Initial valuation for acute altered mental status. Fall.  EXAM: PELVIS - 1-2 VIEW  COMPARISON:  None.  FINDINGS: The femoral heads are normally positioned within the acetabula. Femoral head heights are preserved. No acute fracture or dislocation. Linear lucency traversing the subcapital region of the left femur is stable from prior. No cortical disruption to suggest acute fracture. No pubic diastasis. SI joints are approximated.  Degenerative osteoarthrosis noted about the hips bilaterally. Degenerative changes also noted within the lower lumbar spine.  No acute soft tissue abnormality.  IMPRESSION: No acute fracture or dislocation within the pelvis.   Electronically Signed   By: Rise Mu M.D.   On: 12/27/2014 00:31   Ct Head Wo Contrast  12/27/2014   CLINICAL DATA:  Initial evaluation for altered mental status  EXAM: CT HEAD WITHOUT CONTRAST  TECHNIQUE: Contiguous axial images were obtained from the base of the skull through the vertex without intravenous contrast.  COMPARISON:  Prior study  from 11/12/2014  FINDINGS: Diffuse prominence of the CSF containing spaces is compatible with generalized cerebral atrophy. Patchy and confluent hypodensity within the periventricular deep white matter most consistent with chronic small vessel ischemic disease.  No acute large vessel territory infarct. No mass lesion or midline shift. No intracranial hemorrhage. Basal ganglia calcifications noted. No extra-axial fluid collection. No hydrocephalus.  No acute abnormality seen about the orbits. Scalp soft tissues within normal limits.  Calvarium intact. Paranasal sinuses are clear. No mastoid effusion.  IMPRESSION: 1. No acute intracranial process. 2.  Atrophy with advanced chronic microvascular ischemic disease.   Electronically Signed   By: Rise Mu M.D.   On: 12/27/2014 00:21    Scheduled Meds: . budesonide-formoterol  2 puff Inhalation BID  . calcitRIOL  0.25 mcg Oral Q M,W,F  . clonazePAM  1 mg Oral QHS  . diltiazem  180 mg Oral Daily  . feeding supplement (ENSURE COMPLETE)  237 mL Oral BID BM  . HYDROcodone-homatropine  5 mL Oral QID  . hydrocortisone   Topical BID  . levothyroxine  112 mcg Oral QAC breakfast  . metoprolol tartrate  12.5 mg Oral BID  . ondansetron  4 mg Oral 4 times per day   Or  . ondansetron (ZOFRAN) IV  4 mg Intravenous 4 times per day  . pregabalin  50 mg Oral BID  . saccharomyces boulardii  250 mg Oral BID  . sertraline  150 mg Oral Daily  . Vitamin D (Ergocalciferol)  50,000 Units Oral Q7 days   Continuous Infusions: . sodium chloride 75 mL/hr at 01/09/15 0048    Active Problems:   Vomiting and diarrhea   Protein-calorie malnutrition, severe    Time spent: 40 minutes   [facility]  [facility] Pager [phone]. If 7PM-7AM, please contact night-coverage at [email or web address], password [facility] 01/09/2015, 12:38 PM  LOS: 1 day

## 2015-01-09 NOTE — Progress Notes (Signed)
VASCULAR LAB PRELIMINARY  PRELIMINARY  PRELIMINARY  PRELIMINARY  Bilateral Upper and Lower Extremity Venous Duplex completed.    Preliminary report: Negative DVT or superficial thrombus upper and lower extremities bilaterally.    Loralie ChampagneBishop, [place] Mefferd F, RVT 01/09/2015, 11:10 AM

## 2015-01-09 NOTE — Progress Notes (Signed)
Physical Therapy Treatment Patient Details Name: [name] MRN: [id number] DOB: October 15, 1941 Today's Date: 01/09/2015    History of Present Illness Patient is a 74 yo female admitted 01/05/15 with 7-10 day history of vomiting and diarrhea.  PMH:  CKD, atrial tachycardia, HTN, Hypothyroidism, scoliosis, depression    PT Comments    Pt limited this session due to headache. RN notified. Because of pain pt ambulated in room only, and she was agreeable to sit in recliner. Noted 1 LOB in which min assist was required to recover. Will continue to follow.   Follow Up Recommendations  No PT follow up;Supervision - Intermittent     Equipment Recommendations  None recommended by PT    Recommendations for Other Services       Precautions / Restrictions Precautions Precautions: Fall Precaution Comments: Patient has h/o falls, with last one being several days pta - hit RLE/thigh on table Restrictions Weight Bearing Restrictions: No    Mobility  Bed Mobility Overal bed mobility: Modified Independent             General bed mobility comments: Use of bed rail and increased time.  Patient able to sit EOB unsupported.   Transfers Overall transfer level: Modified independent Equipment used: None             General transfer comment: Patient used correct hand placement.  No physical assist needed.  Ambulation/Gait Ambulation/Gait assistance: Min assist Ambulation Distance (Feet): 25 Feet Assistive device: None Gait Pattern/deviations: Step-through pattern;Decreased stride length;Trunk flexed Gait velocity: Decreased Gait velocity interpretation: Below normal speed for age/gender General Gait Details: Pt ambulated in room only due to headache. Was reaching for furniture for support, and demonstrated 1 LOB when turning to sit in the chair. Min assist required to recover.   Stairs            Wheelchair Mobility    Modified Rankin (Stroke Patients Only)        Balance Overall balance assessment: History of Falls                                  Cognition Arousal/Alertness: Awake/alert Behavior During Therapy: WFL for tasks assessed/performed Overall Cognitive Status: Within Functional Limits for tasks assessed                      Exercises      General Comments        Pertinent Vitals/Pain Pain Assessment: Faces Faces Pain Scale: Hurts even more Pain Location: Headache Pain Intervention(s): Limited activity within patient's tolerance;Monitored during session;Patient requesting pain meds-RN notified    Home Living                      Prior Function            PT Goals (current goals can now be found in the care plan section) Acute Rehab PT Goals Patient Stated Goal: To go home PT Goal Formulation: With patient Time For Goal Achievement: 01/13/15 Potential to Achieve Goals: Good Progress towards PT goals: Progressing toward goals    Frequency  Min 3X/week    PT Plan Current plan remains appropriate    Co-evaluation             End of Session Equipment Utilized During Treatment: Gait belt Activity Tolerance: Patient limited by pain (Headache) Patient left: in chair;with chair alarm set;with call bell/phone within  reach     Time: 1141-1155 PT Time Calculation (min) (ACUTE ONLY): 14 min  Charges:  $Gait Training: 8-22 mins                    G Codes:      Conni Slipper 30-Jan-2015, 12:02 PM   Conni Slipper, PT, DPT Acute Rehabilitation Services Pager: [phone]

## 2015-01-09 NOTE — Progress Notes (Deleted)
Critical KCL 2.7 paged Dr. Ellis ParentsAbrol FYI.

## 2015-01-10 ENCOUNTER — Other Ambulatory Visit: Payer: Self-pay | Admitting: Oncology

## 2015-01-10 DIAGNOSIS — D539 Nutritional anemia, unspecified: Secondary | ICD-10-CM

## 2015-01-10 LAB — FOLATE RBC
Folate, Hemolysate: 283.1 ng/mL
Folate, RBC: 916 ng/mL (ref >498)
HEMATOCRIT: 30.9 % — AB (ref 34.0–46.6)

## 2015-01-10 LAB — CBC WITH DIFFERENTIAL/PLATELET
BASOS PCT: 1 % (ref 0–1)
Basophils Absolute: 0 10*3/uL (ref 0.0–0.1)
EOS ABS: 0.2 10*3/uL (ref 0.0–0.7)
Eosinophils Relative: 5 % (ref 0–5)
HEMATOCRIT: 32 % — AB (ref 36.0–46.0)
Hemoglobin: 10.3 g/dL — ABNORMAL LOW (ref 12.0–15.0)
Lymphocytes Relative: 32 % (ref 12–46)
Lymphs Abs: 1.4 10*3/uL (ref 0.7–4.0)
MCH: 31.6 pg (ref 26.0–34.0)
MCHC: 32.2 g/dL (ref 30.0–36.0)
MCV: 98.2 fL (ref 78.0–100.0)
MONOS PCT: 11 % (ref 3–12)
Monocytes Absolute: 0.5 10*3/uL (ref 0.1–1.0)
Neutro Abs: 2.3 10*3/uL (ref 1.7–7.7)
Neutrophils Relative %: 51 % (ref 43–77)
Platelets: 129 10*3/uL — ABNORMAL LOW (ref 150–400)
RBC: 3.26 MIL/uL — ABNORMAL LOW (ref 3.87–5.11)
RDW: 14.3 % (ref 11.5–15.5)
WBC: 4.4 10*3/uL (ref 4.0–10.5)

## 2015-01-10 LAB — COMPREHENSIVE METABOLIC PANEL
ALBUMIN: 2.9 g/dL — AB (ref 3.5–5.2)
ALT: 13 U/L (ref 0–35)
AST: 19 U/L (ref 0–37)
Alkaline Phosphatase: 54 U/L (ref 39–117)
Anion gap: 5 (ref 5–15)
BUN: 12 mg/dL (ref 6–23)
CALCIUM: 8.9 mg/dL (ref 8.4–10.5)
CO2: 25 mmol/L (ref 19–32)
CREATININE: 1.74 mg/dL — AB (ref 0.50–1.10)
Chloride: 112 mmol/L (ref 96–112)
GFR calc non Af Amer: 28 mL/min — ABNORMAL LOW (ref >90)
GFR, EST AFRICAN AMERICAN: 33 mL/min — AB (ref >90)
Glucose, Bld: 83 mg/dL (ref 70–99)
Potassium: 4.7 mmol/L (ref 3.5–5.1)
Sodium: 142 mmol/L (ref 135–145)
Total Bilirubin: 0.3 mg/dL (ref 0.3–1.2)
Total Protein: 5.4 g/dL — ABNORMAL LOW (ref 6.0–8.3)

## 2015-01-10 MED ORDER — HYDROCODONE-HOMATROPINE 5-1.5 MG/5ML PO SYRP: 5.0000 mL | ORAL_SOLUTION | Freq: Four times a day (QID) | ORAL | Status: DC

## 2015-01-10 NOTE — Discharge Summary (Signed)
Physician Discharge Summary  [name] MRN: [id number] DOB/AGE: 04-25-1941 74 y.o.  PCP: Elyn Peers, MD   Admit date: 01/05/2015 Discharge date: 01/10/2015  Discharge Diagnoses:     Active Problems:   Vomiting and diarrhea   Protein-calorie malnutrition, severe  pancytopenia   Follow-up recommendations Follow-up with PCP in 5-7 days Follow-up CBC with differential, CMP in 1 week Follow-up of the results of HIT panel and RBC folate    Medication List    STOP taking these medications        cloNIDine 0.2 MG tablet  Commonly known as:  CATAPRES     hydrochlorothiazide 25 MG tablet  Commonly known as:  HYDRODIURIL      TAKE these medications        calcitRIOL 0.25 MCG capsule  Commonly known as:  ROCALTROL  Take 0.25 mcg by mouth. Mondays,Wednesdays, and Fridays     clonazePAM 1 MG tablet  Commonly known as:  KLONOPIN  TAKE 1 TO 2 TABLETS BY MOUTH 1 HOUR PRIOR TO BEDTIME     diltiazem 180 MG 24 hr capsule  Commonly known as:  CARDIZEM CD  Take 1 capsule (180 mg total) by mouth daily.     diltiazem 30 MG tablet  Commonly known as:  CARDIZEM  TAKE 1 TABLET (30 MG TOTAL) BY MOUTH EVERY 8 (EIGHT) HOURS AS NEEDED (FOR SUSTAINED PALPITATIONS).     feeding supplement (ENSURE COMPLETE) Liqd  Take 237 mLs by mouth 2 (two) times daily between meals.     ferrous fumarate 325 (106 FE) MG Tabs tablet  Commonly known as:  HEMOCYTE - 106 mg FE  Take 1 tablet by mouth daily.     HYDROcodone-homatropine 5-1.5 MG/5ML syrup  Commonly known as:  HYCODAN  Take 5 mLs by mouth 4 (four) times daily.     hydrocortisone 2.5 % rectal cream  Commonly known as:  ANUSOL-HC  Apply topically 2 (two) times daily.     levothyroxine 112 MCG tablet  Commonly known as:  SYNTHROID, LEVOTHROID  Take 112 mcg by mouth daily before breakfast.     LOVAZA 1 G capsule  Generic drug:  omega-3 acid ethyl esters  Take 1 g by mouth daily.     LYRICA 50 MG capsule  Generic drug:   pregabalin  Take 50 mg by mouth 2 (two) times daily.     metoprolol tartrate 25 MG tablet  Commonly known as:  LOPRESSOR  Take 0.5 tablets (12.5 mg total) by mouth 2 (two) times daily.     ondansetron 4 MG disintegrating tablet  Commonly known as:  ZOFRAN ODT  Take 1 tablet (4 mg total) by mouth every 8 (eight) hours as needed for nausea. 64m ODT q4 hours prn nausea/vomit     potassium chloride SA 20 MEQ tablet  Commonly known as:  K-DUR,KLOR-CON  Take 20 mEq by mouth daily.     rOPINIRole 0.5 MG tablet  Commonly known as:  REQUIP  TAKE 1-2 TABLETS BY MOUTH AT BEDTIME FOR RESTLESS LEG     saccharomyces boulardii 250 MG capsule  Commonly known as:  FLORASTOR  Take 1 capsule (250 mg total) by mouth 2 (two) times daily.     SYMBICORT IN  Inhale 1-2 puffs into the lungs as needed (shortness of breath).     Vitamin D (Ergocalciferol) 50000 UNITS Caps capsule  Commonly known as:  DRISDOL  Take 50,000 Units by mouth every 7 (seven) days. Mondays  ZOLOFT 100 MG tablet  Generic drug:  sertraline  Take 150 mg by mouth daily.        Discharge Condition:  Stable Disposition: 01-Home or Self Care   Consults:  Hematology    Significant Diagnostic Studies: Ct Abdomen Pelvis Wo Contrast  01/05/2015   CLINICAL DATA:  Low abdominal pain for 1 week  EXAM: CT ABDOMEN AND PELVIS WITHOUT CONTRAST  TECHNIQUE: Multidetector CT imaging of the abdomen and pelvis was performed following the standard protocol without IV contrast.  COMPARISON:  02/11/2010  FINDINGS: Lower chest:  Lung bases are clear.  Hepatobiliary: Unenhanced liver and gallbladder appear unremarkable.  Pancreas: Normal  Spleen: Normal  Adrenals/Urinary Tract: Adrenal glands are unremarkable. Mild lobulation to the renal cortical contour bilaterally without hydroureteronephrosis. No radiopaque renal or ureteral calculus. The bladder is decompressed but unremarkable.  Stomach/Bowel: Stomach and bowel appear normal.   Vascular/Lymphatic: Mild atheromatous aortic calcification without aneurysm. No lymphadenopathy.  Other: Minimal haziness at the root of the small bowel mesentery is nonspecific but stable since 2011. No ascites or free air. Small fat containing left inguinal hernia. Uterus presumed surgically absent. No adnexal mass.  Musculoskeletal: Minimal stranding is noted in the region of the right groin, correlate clinically for any evidence of cellulitis or other skin abnormality. S shaped thoracolumbar scoliosis is identified. No acute abnormality.  IMPRESSION: No acute intra-abdominal or pelvic pathology.  Minimal stranding in the right inguinal subcutaneous soft tissues, correlate clinically for any evidence of cellulitis or other dermal abnormality.   Electronically Signed   By: Conchita Paris M.D.   On: 01/05/2015 17:44   Dg Chest 2 View  01/08/2015   CLINICAL DATA:  Shortness of breath, cough, hypertension  EXAM: CHEST  2 VIEW  COMPARISON:  12/27/2014  FINDINGS: Upper normal heart size.  Normal mediastinal contours and pulmonary vascularity.  Lungs clear.  No pleural effusion or pneumothorax.  Bones demineralized.  IMPRESSION: No acute abnormalities.   Electronically Signed   By: Lavonia Dana M.D.   On: 01/08/2015 15:56   Dg Chest 2 View  12/27/2014   CLINICAL DATA:  Initial evaluation for acute altered mental status, fall.  EXAM: CHEST  2 VIEW  COMPARISON:  Prior study from 06/28/2014  FINDINGS: Cardiac and mediastinal silhouettes are stable in size and contour, and remain within normal limits.  Lungs are mildly hypoinflated. No focal infiltrate, pulmonary edema, or pleural effusion. There is no pneumothorax.  Scoliosis noted.  No acute osseus abnormality.  IMPRESSION: No active cardiopulmonary disease.   Electronically Signed   By: Jeannine Boga M.D.   On: 12/27/2014 00:27   Dg Pelvis 1-2 Views  12/27/2014   CLINICAL DATA:  Initial valuation for acute altered mental status. Fall.  EXAM: PELVIS - 1-2  VIEW  COMPARISON:  None.  FINDINGS: The femoral heads are normally positioned within the acetabula. Femoral head heights are preserved. No acute fracture or dislocation. Linear lucency traversing the subcapital region of the left femur is stable from prior. No cortical disruption to suggest acute fracture. No pubic diastasis. SI joints are approximated.  Degenerative osteoarthrosis noted about the hips bilaterally. Degenerative changes also noted within the lower lumbar spine.  No acute soft tissue abnormality.  IMPRESSION: No acute fracture or dislocation within the pelvis.   Electronically Signed   By: Jeannine Boga M.D.   On: 12/27/2014 00:31   Ct Head Wo Contrast  12/27/2014   CLINICAL DATA:  Initial evaluation for altered mental status  EXAM: CT  HEAD WITHOUT CONTRAST  TECHNIQUE: Contiguous axial images were obtained from the base of the skull through the vertex without intravenous contrast.  COMPARISON:  Prior study from 11/12/2014  FINDINGS: Diffuse prominence of the CSF containing spaces is compatible with generalized cerebral atrophy. Patchy and confluent hypodensity within the periventricular deep white matter most consistent with chronic small vessel ischemic disease.  No acute large vessel territory infarct. No mass lesion or midline shift. No intracranial hemorrhage. Basal ganglia calcifications noted. No extra-axial fluid collection. No hydrocephalus.  No acute abnormality seen about the orbits. Scalp soft tissues within normal limits.  Calvarium intact. Paranasal sinuses are clear. No mastoid effusion.  IMPRESSION: 1. No acute intracranial process. 2. Atrophy with advanced chronic microvascular ischemic disease.   Electronically Signed   By: Jeannine Boga M.D.   On: 12/27/2014 00:21      Microbiology: Recent Results (from the past 240 hour(s))  Stool culture     Status: None (Preliminary result)   Collection Time: 01/06/15 11:37 PM  Result Value Ref Range Status   Specimen  Description STOOL  Final   Special Requests NONE  Final   Culture   Final    NO SUSPICIOUS COLONIES, CONTINUING TO HOLD Performed at [facility]    Report Status PENDING  Incomplete  Rapid strep screen     Status: None   Collection Time: 01/08/15  1:25 PM  Result Value Ref Range Status   Streptococcus, Group A Screen (Direct) NEGATIVE NEGATIVE Final    Comment: (NOTE) A Rapid Antigen test may result negative if the antigen level in the sample is below the detection level of this test. The FDA has not cleared this test as a stand-alone test therefore the rapid antigen negative result has reflexed to a Group A Strep culture.   Clostridium Difficile by PCR     Status: None   Collection Time: 01/08/15  6:50 PM  Result Value Ref Range Status   C difficile by pcr NEGATIVE NEGATIVE Final     Labs: Results for orders placed or performed during the hospital encounter of 01/05/15 (from the past 48 hour(s))  Rapid strep screen     Status: None   Collection Time: 01/08/15  1:25 PM  Result Value Ref Range   Streptococcus, Group A Screen (Direct) NEGATIVE NEGATIVE    Comment: (NOTE) A Rapid Antigen test may result negative if the antigen level in the sample is below the detection level of this test. The FDA has not cleared this test as a stand-alone test therefore the rapid antigen negative result has reflexed to a Group A Strep culture.   Influenza panel by PCR (type A & B, H1N1)     Status: None   Collection Time: 01/08/15  1:26 PM  Result Value Ref Range   Influenza A By PCR NEGATIVE NEGATIVE   Influenza B By PCR NEGATIVE NEGATIVE   H1N1 flu by pcr NOT DETECTED NOT DETECTED    Comment:        The Xpert Flu assay (FDA approved for nasal aspirates or washes and nasopharyngeal swab specimens), is intended as an aid in the diagnosis of influenza and should not be used as a sole basis for treatment.   Troponin I     Status: None   Collection Time: 01/08/15  6:50 PM  Result  Value Ref Range   Troponin I <0.03 <0.031 ng/mL    Comment:        NO INDICATION OF MYOCARDIAL INJURY.  Lactate dehydrogenase     Status: None   Collection Time: 01/08/15  6:50 PM  Result Value Ref Range   LDH 135 94 - 250 U/L  Haptoglobin     Status: None   Collection Time: 01/08/15  6:50 PM  Result Value Ref Range   Haptoglobin 47 34 - 200 mg/dL    Comment: (NOTE) Performed At: [facility] [place], [place] [id number] Lindon Romp MD MP:[id number]   Save smear     Status: None   Collection Time: 01/08/15  6:50 PM  Result Value Ref Range   Smear Review SMEAR STAINED AND AVAILABLE FOR REVIEW   Clostridium Difficile by PCR     Status: None   Collection Time: 01/08/15  6:50 PM  Result Value Ref Range   C difficile by pcr NEGATIVE NEGATIVE  Comprehensive metabolic panel     Status: Abnormal   Collection Time: 01/09/15  6:46 AM  Result Value Ref Range   Sodium 137 135 - 145 mmol/L   Potassium 4.3 3.5 - 5.1 mmol/L   Chloride 113 (H) 96 - 112 mmol/L   CO2 21 19 - 32 mmol/L   Glucose, Bld 92 70 - 99 mg/dL   BUN 12 6 - 23 mg/dL   Creatinine, Ser 1.62 (H) 0.50 - 1.10 mg/dL   Calcium 8.5 8.4 - 10.5 mg/dL   Total Protein 5.4 (L) 6.0 - 8.3 g/dL   Albumin 2.8 (L) 3.5 - 5.2 g/dL   AST 16 0 - 37 U/L   ALT 12 0 - 35 U/L   Alkaline Phosphatase 58 39 - 117 U/L   Total Bilirubin 0.3 0.3 - 1.2 mg/dL   GFR calc non Af Amer 31 (L) >90 mL/min   GFR calc Af Amer 36 (L) >90 mL/min    Comment: (NOTE) The eGFR has been calculated using the CKD EPI equation. This calculation has not been validated in all clinical situations. eGFR's persistently <90 mL/min signify possible Chronic Kidney Disease.    Anion gap 3 (L) 5 - 15  CBC WITH DIFFERENTIAL     Status: Abnormal   Collection Time: 01/09/15  6:46 AM  Result Value Ref Range   WBC 4.3 4.0 - 10.5 K/uL   RBC 3.20 (L) 3.87 - 5.11 MIL/uL   Hemoglobin 9.9 (L) 12.0 - 15.0 g/dL   HCT 31.1 (L) 36.0 - 46.0 %   MCV  97.2 78.0 - 100.0 fL   MCH 30.9 26.0 - 34.0 pg   MCHC 31.8 30.0 - 36.0 g/dL   RDW 14.3 11.5 - 15.5 %   Platelets 132 (L) 150 - 400 K/uL   Neutrophils Relative % 51 43 - 77 %   Neutro Abs 2.2 1.7 - 7.7 K/uL   Lymphocytes Relative 33 12 - 46 %   Lymphs Abs 1.4 0.7 - 4.0 K/uL   Monocytes Relative 11 3 - 12 %   Monocytes Absolute 0.5 0.1 - 1.0 K/uL   Eosinophils Relative 4 0 - 5 %   Eosinophils Absolute 0.2 0.0 - 0.7 K/uL   Basophils Relative 0 0 - 1 %   Basophils Absolute 0.0 0.0 - 0.1 K/uL  Ferritin     Status: None   Collection Time: 01/09/15  6:46 AM  Result Value Ref Range   Ferritin 80 10 - 291 ng/mL    Comment: Performed at [facility]  Vitamin B12     Status: None   Collection Time: 01/09/15  6:46 AM  Result Value Ref Range   Vitamin B-12 370 211 - 911 pg/mL    Comment: Performed at [facility]  Reticulocytes     Status: Abnormal   Collection Time: 01/09/15  6:46 AM  Result Value Ref Range   Retic Ct Pct 2.1 0.4 - 3.1 %   RBC. 3.20 (L) 3.87 - 5.11 MIL/uL   Retic Count, Manual 67.2 19.0 - 186.0 K/uL  Comprehensive metabolic panel     Status: Abnormal   Collection Time: 01/10/15  5:50 AM  Result Value Ref Range   Sodium 142 135 - 145 mmol/L   Potassium 4.7 3.5 - 5.1 mmol/L   Chloride 112 96 - 112 mmol/L   CO2 25 19 - 32 mmol/L   Glucose, Bld 83 70 - 99 mg/dL   BUN 12 6 - 23 mg/dL   Creatinine, Ser 1.74 (H) 0.50 - 1.10 mg/dL   Calcium 8.9 8.4 - 10.5 mg/dL   Total Protein 5.4 (L) 6.0 - 8.3 g/dL   Albumin 2.9 (L) 3.5 - 5.2 g/dL   AST 19 0 - 37 U/L   ALT 13 0 - 35 U/L   Alkaline Phosphatase 54 39 - 117 U/L   Total Bilirubin 0.3 0.3 - 1.2 mg/dL   GFR calc non Af Amer 28 (L) >90 mL/min   GFR calc Af Amer 33 (L) >90 mL/min    Comment: (NOTE) The eGFR has been calculated using the CKD EPI equation. This calculation has not been validated in all clinical situations. eGFR's persistently <90 mL/min signify possible Chronic Kidney Disease.    Anion gap  5 5 - 15  CBC WITH DIFFERENTIAL     Status: Abnormal   Collection Time: 01/10/15  5:50 AM  Result Value Ref Range   WBC 4.4 4.0 - 10.5 K/uL   RBC 3.26 (L) 3.87 - 5.11 MIL/uL   Hemoglobin 10.3 (L) 12.0 - 15.0 g/dL   HCT 32.0 (L) 36.0 - 46.0 %   MCV 98.2 78.0 - 100.0 fL   MCH 31.6 26.0 - 34.0 pg   MCHC 32.2 30.0 - 36.0 g/dL   RDW 14.3 11.5 - 15.5 %   Platelets 129 (L) 150 - 400 K/uL   Neutrophils Relative % 51 43 - 77 %   Neutro Abs 2.3 1.7 - 7.7 K/uL   Lymphocytes Relative 32 12 - 46 %   Lymphs Abs 1.4 0.7 - 4.0 K/uL   Monocytes Relative 11 3 - 12 %   Monocytes Absolute 0.5 0.1 - 1.0 K/uL   Eosinophils Relative 5 0 - 5 %   Eosinophils Absolute 0.2 0.0 - 0.7 K/uL   Basophils Relative 1 0 - 1 %   Basophils Absolute 0.0 0.0 - 0.1 K/uL     HPI :History of Present Illness:This is a 74 y.o. year old female with significant past medical history of stage 3 CKD, atrial tachycardia, HTN, hypothyroidism presenting with vomiting and diarrhea. Pt reports 7-10 days of recurrent vomiting and diarrhea. Has not been able to hold anything down by mouth. Subjective fevers and chills. No known sick contacts. Has not been able to take medications 2/2 nausea. Emesis and diarrhea NBNB. Mild RLQ tenderness over past 2-3 days.  Presented to ER T 98.3, HR 60s-70s, resp 10s, BP 130s-160s, satting 96% on RA. CBC and CMET WNL apart from Cr 1.84 (baseline 2.2). CT abd and pelvis with no acute intra-abdominal findings. ? Stranding R inguinal soft tissues.   HOSPITAL COURSE:    1-Vomiting  and diarrhea likely secondary to viral gastroenteritis -no acute intra-abdominal findings on imaging -? R inguinal soft tissue findings-benign exam, CT scan of the abdomen negative Off ciprofloxacin and Flagyl 1 day Appetite is improving, no further diarrhea  Continue florastor 2 weeks C. difficile negative, stool culture negative, Influenza PCR negative  2-CKD , stage III, improving -stage 3 chronically  1.84>  1.50>1.82>1.51>1.62 Improved with IV hydration Follow renal function outpatient  3- HTN continues to be intermittently hypotensive -BP soft, holding clonidine, dose of Cardizem decreased, continue metoprolol 2-D echo and troponin within normal limits, grade 2 diastolic dysfunction  4-Atrial tachycardia Continue diltiazem and Lopressor if possible Telemetry benign   5. Pancytopenia Could be secondary to a viral infection HIT panel for thrombocytopenia pending, bilateral upper and lower extremities Dopplers negative   peripheral smear and LDH within normal limits Discussed with Dr. Jana Hakim , appreciate hematology input, Roland Rack improving Follow-up with hematology in 2 weeks Vitamin B-12 370, ferritin 80, RBC folate pending Stool guaiac negative  Cough  Still has a cough  Influenza and rapid strep negative Repeat chest x-ray 2/21 negative    6. Hemorrhoids Continue Anusol HC  7 hyperkalemia Resolved . Code Status: full   Discharge Exam:  Blood pressure 117/47, pulse 65, temperature 98.6 F (37 C), temperature source Oral, resp. rate 20, height _0  (1.6 m), weight 63.08 kg (139 lb 1.1 oz), SpO2 100 %.  General: No acute respiratory distress Lungs: Clear to auscultation bilaterally without wheezes or crackles Cardiovascular: Regular rate and rhythm without murmur gallop or rub normal S1 and S2 Abdomen: Nontender, nondistended, soft, bowel sounds positive, no rebound, no ascites, no appreciable mass Extremities: No significant cyanosis, clubbing, or edema bilateral lower extremities       Discharge Instructions    Diet - low sodium heart healthy    Complete by:  As directed      Diet - low sodium heart healthy    Complete by:  As directed      Increase activity slowly    Complete by:  As directed      Increase activity slowly    Complete by:  As directed            Follow-up Information    Follow up with Elyn Peers, MD. Schedule an appointment as  soon as possible for a visit in 3 days.   Specialty:  Family Medicine   Contact information:   [address] [place] [address] [phone]       Signed: Reyne Dumas 01/10/2015, 12:00 PM

## 2015-01-10 NOTE — Progress Notes (Signed)
Pt provided with discharge instruction including information on follow up appointments and new medications. Pt provided with prescriptions. Pt and family verbalized understanding of all information. IV was dc'd without complication. VSS. Pt was escorted out via wheelchair by NT.   Carrie MewJasmine Oniel Meleski, RN

## 2015-01-11 ENCOUNTER — Telehealth: Payer: Self-pay | Admitting: Internal Medicine

## 2015-01-11 ENCOUNTER — Telehealth: Payer: Self-pay | Admitting: Oncology

## 2015-01-11 LAB — CULTURE, GROUP A STREP: Strep A Culture: NEGATIVE

## 2015-01-11 LAB — STOOL CULTURE

## 2015-01-11 NOTE — Telephone Encounter (Signed)
atc pt, line rang for over 1.5 minutes with no answer.  Wcb.

## 2015-01-11 NOTE — Telephone Encounter (Signed)
S/W PT IN REF TO HOSP. F/U ON 02/09/15@10 :30

## 2015-01-12 DIAGNOSIS — I132 Hypertensive heart and chronic kidney disease with heart failure and with stage 5 chronic kidney disease, or end stage renal disease: Secondary | ICD-10-CM | POA: Diagnosis not present

## 2015-01-12 DIAGNOSIS — A084 Viral intestinal infection, unspecified: Secondary | ICD-10-CM | POA: Diagnosis not present

## 2015-01-12 DIAGNOSIS — R110 Nausea: Secondary | ICD-10-CM | POA: Diagnosis not present

## 2015-01-12 DIAGNOSIS — R1110 Vomiting, unspecified: Secondary | ICD-10-CM | POA: Diagnosis not present

## 2015-01-12 NOTE — Telephone Encounter (Signed)
Called and spoke to pt. Pt stated she was unable to get Klonopin refilled. Rx has 5 refills on med. Called pt's pharmacy and they stated they overlooked pt's refill and she is able to get her medications. Called and spoke to pt and informed her of the refills and she should not have problem getting her med filled, if so contact us back. Pt verbalized understanding and denied any further questions or concerns at this time.

## 2015-01-14 LAB — SEROTONIN RELEASE ASSAY (SRA)
SRA .2 IU/mL UFH Ser-aCnc: 3 % (ref 0–20)
SRA 100IU/mL UFH Ser-aCnc: 3 % (ref 0–20)

## 2015-01-14 LAB — HEPARIN INDUCED THROMBOCYTOPENIA PNL: HEPARIN INDUCED PLT AB: 0.47 {OD_unit} — AB (ref 0.000–0.400)

## 2015-01-19 DIAGNOSIS — H11153 Pinguecula, bilateral: Secondary | ICD-10-CM | POA: Diagnosis not present

## 2015-01-19 DIAGNOSIS — F328 Other depressive episodes: Secondary | ICD-10-CM | POA: Diagnosis not present

## 2015-01-19 DIAGNOSIS — I779 Disorder of arteries and arterioles, unspecified: Secondary | ICD-10-CM | POA: Diagnosis not present

## 2015-01-19 DIAGNOSIS — I10 Essential (primary) hypertension: Secondary | ICD-10-CM | POA: Diagnosis not present

## 2015-01-19 DIAGNOSIS — R296 Repeated falls: Secondary | ICD-10-CM | POA: Diagnosis not present

## 2015-01-19 DIAGNOSIS — R55 Syncope and collapse: Secondary | ICD-10-CM | POA: Diagnosis not present

## 2015-01-23 DIAGNOSIS — F328 Other depressive episodes: Secondary | ICD-10-CM | POA: Diagnosis not present

## 2015-01-23 DIAGNOSIS — R55 Syncope and collapse: Secondary | ICD-10-CM | POA: Diagnosis not present

## 2015-01-23 DIAGNOSIS — I779 Disorder of arteries and arterioles, unspecified: Secondary | ICD-10-CM | POA: Diagnosis not present

## 2015-01-23 DIAGNOSIS — R296 Repeated falls: Secondary | ICD-10-CM | POA: Diagnosis not present

## 2015-01-23 DIAGNOSIS — I10 Essential (primary) hypertension: Secondary | ICD-10-CM | POA: Diagnosis not present

## 2015-01-25 DIAGNOSIS — Z961 Presence of intraocular lens: Secondary | ICD-10-CM | POA: Diagnosis not present

## 2015-01-26 DIAGNOSIS — F328 Other depressive episodes: Secondary | ICD-10-CM | POA: Diagnosis not present

## 2015-01-26 DIAGNOSIS — R55 Syncope and collapse: Secondary | ICD-10-CM | POA: Diagnosis not present

## 2015-01-26 DIAGNOSIS — R296 Repeated falls: Secondary | ICD-10-CM | POA: Diagnosis not present

## 2015-01-26 DIAGNOSIS — I779 Disorder of arteries and arterioles, unspecified: Secondary | ICD-10-CM | POA: Diagnosis not present

## 2015-01-26 DIAGNOSIS — I10 Essential (primary) hypertension: Secondary | ICD-10-CM | POA: Diagnosis not present

## 2015-01-30 DIAGNOSIS — F328 Other depressive episodes: Secondary | ICD-10-CM | POA: Diagnosis not present

## 2015-01-30 DIAGNOSIS — R296 Repeated falls: Secondary | ICD-10-CM | POA: Diagnosis not present

## 2015-01-30 DIAGNOSIS — I779 Disorder of arteries and arterioles, unspecified: Secondary | ICD-10-CM | POA: Diagnosis not present

## 2015-01-30 DIAGNOSIS — R55 Syncope and collapse: Secondary | ICD-10-CM | POA: Diagnosis not present

## 2015-01-30 DIAGNOSIS — I10 Essential (primary) hypertension: Secondary | ICD-10-CM | POA: Diagnosis not present

## 2015-01-31 DIAGNOSIS — F328 Other depressive episodes: Secondary | ICD-10-CM | POA: Diagnosis not present

## 2015-01-31 DIAGNOSIS — I779 Disorder of arteries and arterioles, unspecified: Secondary | ICD-10-CM | POA: Diagnosis not present

## 2015-01-31 DIAGNOSIS — R296 Repeated falls: Secondary | ICD-10-CM | POA: Diagnosis not present

## 2015-01-31 DIAGNOSIS — R55 Syncope and collapse: Secondary | ICD-10-CM | POA: Diagnosis not present

## 2015-01-31 DIAGNOSIS — I10 Essential (primary) hypertension: Secondary | ICD-10-CM | POA: Diagnosis not present

## 2015-02-01 DIAGNOSIS — I10 Essential (primary) hypertension: Secondary | ICD-10-CM | POA: Diagnosis not present

## 2015-02-01 DIAGNOSIS — I779 Disorder of arteries and arterioles, unspecified: Secondary | ICD-10-CM | POA: Diagnosis not present

## 2015-02-01 DIAGNOSIS — R55 Syncope and collapse: Secondary | ICD-10-CM | POA: Diagnosis not present

## 2015-02-01 DIAGNOSIS — F328 Other depressive episodes: Secondary | ICD-10-CM | POA: Diagnosis not present

## 2015-02-01 DIAGNOSIS — R296 Repeated falls: Secondary | ICD-10-CM | POA: Diagnosis not present

## 2015-02-02 DIAGNOSIS — I779 Disorder of arteries and arterioles, unspecified: Secondary | ICD-10-CM | POA: Diagnosis not present

## 2015-02-02 DIAGNOSIS — R55 Syncope and collapse: Secondary | ICD-10-CM | POA: Diagnosis not present

## 2015-02-02 DIAGNOSIS — F328 Other depressive episodes: Secondary | ICD-10-CM | POA: Diagnosis not present

## 2015-02-02 DIAGNOSIS — R296 Repeated falls: Secondary | ICD-10-CM | POA: Diagnosis not present

## 2015-02-02 DIAGNOSIS — I10 Essential (primary) hypertension: Secondary | ICD-10-CM | POA: Diagnosis not present

## 2015-02-03 DIAGNOSIS — F328 Other depressive episodes: Secondary | ICD-10-CM | POA: Diagnosis not present

## 2015-02-03 DIAGNOSIS — I10 Essential (primary) hypertension: Secondary | ICD-10-CM | POA: Diagnosis not present

## 2015-02-03 DIAGNOSIS — R296 Repeated falls: Secondary | ICD-10-CM | POA: Diagnosis not present

## 2015-02-03 DIAGNOSIS — R55 Syncope and collapse: Secondary | ICD-10-CM | POA: Diagnosis not present

## 2015-02-03 DIAGNOSIS — I779 Disorder of arteries and arterioles, unspecified: Secondary | ICD-10-CM | POA: Diagnosis not present

## 2015-02-04 DIAGNOSIS — I779 Disorder of arteries and arterioles, unspecified: Secondary | ICD-10-CM | POA: Diagnosis not present

## 2015-02-04 DIAGNOSIS — R296 Repeated falls: Secondary | ICD-10-CM | POA: Diagnosis not present

## 2015-02-04 DIAGNOSIS — R55 Syncope and collapse: Secondary | ICD-10-CM | POA: Diagnosis not present

## 2015-02-04 DIAGNOSIS — F328 Other depressive episodes: Secondary | ICD-10-CM | POA: Diagnosis not present

## 2015-02-04 DIAGNOSIS — I10 Essential (primary) hypertension: Secondary | ICD-10-CM | POA: Diagnosis not present

## 2015-02-05 ENCOUNTER — Other Ambulatory Visit: Payer: Self-pay | Admitting: Internal Medicine

## 2015-02-06 ENCOUNTER — Other Ambulatory Visit: Payer: Medicare Other

## 2015-02-06 ENCOUNTER — Ambulatory Visit: Payer: Medicare Other

## 2015-02-06 ENCOUNTER — Inpatient Hospital Stay: Payer: Medicare Other | Admitting: Nurse Practitioner

## 2015-02-09 ENCOUNTER — Ambulatory Visit (HOSPITAL_BASED_OUTPATIENT_CLINIC_OR_DEPARTMENT_OTHER): Payer: Medicare Other | Admitting: Nurse Practitioner

## 2015-02-09 ENCOUNTER — Ambulatory Visit: Payer: Medicare Other

## 2015-02-09 ENCOUNTER — Encounter: Payer: Self-pay | Admitting: Oncology

## 2015-02-09 ENCOUNTER — Other Ambulatory Visit (HOSPITAL_BASED_OUTPATIENT_CLINIC_OR_DEPARTMENT_OTHER): Payer: Medicare Other

## 2015-02-09 ENCOUNTER — Encounter: Payer: Self-pay | Admitting: Nurse Practitioner

## 2015-02-09 VITALS — BP 140/76 | HR 79 | Temp 98.1°F | Resp 18 | Ht 63.0 in | Wt 123.0 lb

## 2015-02-09 DIAGNOSIS — D759 Disease of blood and blood-forming organs, unspecified: Secondary | ICD-10-CM | POA: Insufficient documentation

## 2015-02-09 DIAGNOSIS — D539 Nutritional anemia, unspecified: Secondary | ICD-10-CM

## 2015-02-09 LAB — CBC & DIFF AND RETIC
BASO%: 0.3 % (ref 0.0–2.0)
Basophils Absolute: 0 10*3/uL (ref 0.0–0.1)
EOS%: 1 % (ref 0.0–7.0)
Eosinophils Absolute: 0.1 10*3/uL (ref 0.0–0.5)
HCT: 40.6 % (ref 34.8–46.6)
HGB: 13.5 g/dL (ref 11.6–15.9)
Immature Retic Fract: 4.6 % (ref 1.60–10.00)
LYMPH%: 27 % (ref 14.0–49.7)
MCH: 31.3 pg (ref 25.1–34.0)
MCHC: 33.3 g/dL (ref 31.5–36.0)
MCV: 94 fL (ref 79.5–101.0)
MONO#: 0.6 10*3/uL (ref 0.1–0.9)
MONO%: 10.2 % (ref 0.0–14.0)
NEUT#: 3.8 10*3/uL (ref 1.5–6.5)
NEUT%: 61.5 % (ref 38.4–76.8)
PLATELETS: 165 10*3/uL (ref 145–400)
RBC: 4.32 10*6/uL (ref 3.70–5.45)
RDW: 12.6 % (ref 11.2–14.5)
RETIC %: 1.2 % (ref 0.70–2.10)
RETIC CT ABS: 51.84 10*3/uL (ref 33.70–90.70)
WBC: 6.1 10*3/uL (ref 3.9–10.3)
lymph#: 1.7 10*3/uL (ref 0.9–3.3)

## 2015-02-09 NOTE — Progress Notes (Signed)
[facility]  [phone] [phone]     ID: ANAGABRIELA JOKERST DOB: [birth year] MR#: [id number] JYN#:[id number]  Patient Care Team: Renaye Rakers, MD as PCP - General (Family Medicine) PCP: Geraldo Pitter, MD GYN: SU:  OTHER MD: Hendricks Limes MD, Terrial Rhodes MD. Cliffton Asters MD  CHIEF COMPLAINT: hospital follow up. CURRENT TREATMENT: none   HISTORY OF PRESENT ILLNESS: Taken from the consult note on 01/08/15: "patient tells me she had nausea, vomiting and diarrhea for 3 weeks at home before seeking medical attention. She was seen in the ED 12/26/2014 with altered mental status, possibly due to benzodiazepines. At that point the WBC/ Hb / platelet cunts were 3.0/ 9.7 / 106. These had been normal 10/29 2015 (5.4/ 12.8 /170). On 01/05/2015 she returned to the ED with complaints as above, and at this point initial CBC was normal (6.7 / 13.7 /216). It had dropped within 24 hours, partly at least due to rehydration (3.3 / 10.1 / 120). They have continued to drift down, so that today the values were 2.9 / 8.7 / 101. Hematology was consulted for further evaluation"  INTERVAL HISTORY: Vannesa returns today for follow up of her progressive cytopenias, accompanied today by her daughter. She was released form the hospital on 2/23. She is still regaining her strength, but otherwise is feeling somewhat improved.   REVIEW OF SYSTEMS: Jeidi denies fevers or chills. She occasionally has some nausea, but no vomiting. She errs on the side of constipation. Her appetite is poor, but she supplements with Ensure shakes. She has some dizziness and frequent headaches. Recently she has seen an increase in falls, and now uses her walker more regularly at home. She does not sleep well and has to take  klonopin. She endorses some anxiety and depression. She denies shortness of breath, chest pain, cough, or palpitations. A detailed review of systems is otherwise stable.  PAST MEDICAL  HISTORY: Past Medical History  Diagnosis Date  . Hypertension   . Abnormal heart rhythm   . Osteoporosis   . Scoliosis   . Afib     h/o  . Reactive airway disease   . Depression   . Echocardiogram abnormal 02/26/11    MVP,mild MR,AOV mildly sclerotic. EF >55%  . Dementia   . Chronic kidney disease (CKD), stage III (moderate)     [name] 01/05/2015  . Hypothyroidism   . Sleep apnea     "suppose to wear a mask; can't tolerate it" (01/05/2015)  . GERD (gastroesophageal reflux disease)   . Arthritis     "hips, shoulders" (01/05/2015)  . Anxiety     PAST SURGICAL HISTORY: Past Surgical History  Procedure Laterality Date  . Tubal ligation    . Cesarean section  1967  . Cesarean section  1971  . Open reduction internal fixation (orif) distal radial fracture Left 12/2009    [name] 12/28/2009  . Total knee arthroplasty Left 03/2010    [name] 04/07/2010  . Joint replacement    . Vaginal hysterectomy  1980  . Dilation and curettage of uterus    . Cataract extraction, bilateral Bilateral     FAMILY HISTORY Family History  Problem Relation Age of Onset  . Heart disease Father   . Cancer Father   . Cancer Mother   father died from lung cancer age 48, mother same age 57; both were smokers. She had 3 brothers, no sisters. No other cancer in the family  GYNECOLOGIC HISTORY:  No LMP recorded. Patient  has had a hysterectomy. 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 She is divorced and lives alone with her chihuahua. Daughter Ignacia Marvelamela Rice works in [employer]. Son Evette CristalBrian Bushee fixes vending machines. The patient just welcomed her 3d grandchild. She is not a church attender  ADVANCED DIRECTIVES:     HEALTH MAINTENANCE: History  Substance Use Topics  . Smoking status: Never Smoker   . Smokeless tobacco: Never Used  . Alcohol Use: No    Colonoscopy: PAP: Bone density: Lipid panel:  No Known Allergies   Current Outpatient Prescriptions on File Prior to Visit  Medication Sig Dispense Refill  . Budesonide-Formoterol Fumarate (SYMBICORT IN) Inhale 1-2 puffs into the lungs as needed (shortness of breath).    . calcitRIOL (ROCALTROL) 0.25 MCG capsule Take 0.25 mcg by mouth. Mondays,Wednesdays, and Fridays    . clonazePAM (KLONOPIN) 1 MG tablet TAKE 1 TO 2 TABLETS BY MOUTH 1 HOUR PRIOR TO BEDTIME 60 tablet 5  . diltiazem (CARDIZEM CD) 180 MG 24 hr capsule Take 1 capsule (180 mg total) by mouth daily. 30 capsule 0  . diltiazem (CARDIZEM) 30 MG tablet TAKE 1 TABLET (30 MG TOTAL) BY MOUTH EVERY 8 (EIGHT) HOURS AS NEEDED (FOR SUSTAINED PALPITATIONS). 90 tablet 1  . feeding supplement, ENSURE COMPLETE, (ENSURE COMPLETE) LIQD Take 237 mLs by mouth 2 (two) times daily between meals. 237 mL 0  . ferrous fumarate (HEMOCYTE - 106 MG FE) 325 (106 FE) MG TABS Take 1 tablet by mouth daily.    [name]. HYDROcodone-homatropine (HYCODAN) 5-1.5 MG/5ML syrup Take 5 mLs by mouth 4 (four) times daily. 240 mL 0  . hydrocortisone (ANUSOL-HC) 2.5 % rectal cream Apply topically 2 (two) times daily. 30 g 0  . levothyroxine (SYNTHROID, LEVOTHROID) 112 MCG tablet Take 112 mcg by mouth daily before breakfast.    . LYRICA 50 MG capsule Take 50 mg by mouth 2 (two) times daily.     . metoprolol tartrate (LOPRESSOR) 25 MG tablet Take 0.5 tablets (12.5 mg total) by mouth 2 (two) times daily. 30 tablet 8  . omega-3 acid ethyl esters (LOVAZA) 1 G capsule Take 1 g by mouth daily.      . ondansetron (ZOFRAN ODT) 4 MG disintegrating tablet Take 1 tablet (4 mg total) by mouth every 8 (eight) hours as needed for nausea. 4mg  ODT q4 hours prn nausea/vomit 30 tablet 0  .  potassium chloride SA (K-DUR,KLOR-CON) 20 MEQ tablet Take 20 mEq by mouth daily.      [name]. rOPINIRole (REQUIP) 0.5 MG tablet TAKE 1-2 TABLETS BY MOUTH AT BEDTIME FOR RESTLESS LEG 30 tablet 0  . saccharomyces boulardii (FLORASTOR) 250 MG capsule Take 1 capsule (250 mg total) by mouth 2 (two) times daily. 30 capsule 0  . sertraline (ZOLOFT) 100 MG tablet Take 150 mg by mouth daily.    . Vitamin D, Ergocalciferol, (DRISDOL) 50000 UNITS CAPS capsule Take 50,000 Units by mouth every 7 (seven) days. Mondays     No current facility-administered medications on file prior to visit.    OBJECTIVE: older White woman examined on exam table  Filed Vitals:   02/09/15 1035  BP: 140/76  Pulse: 79  Temp: 98.1 F (36.7 C)  Resp: 18    Skin: warm, dry  HEENT: sclerae anicteric, conjunctivae pink, oropharynx clear. No thrush or mucositis.  Lymph  Nodes: No cervical or supraclavicular lymphadenopathy  Lungs: clear to auscultation bilaterally, no rales, wheezes, or rhonci  Heart: regular rate and rhythm  Abdomen: round, soft, non tender, positive bowel sounds  Musculoskeletal: No focal spinal tenderness, no peripheral edema  Neuro: non focal, well oriented, positive affect  Breasts: deferred  LAB RESULTS: CBC    Component Value Date/Time   WBC 6.1 02/09/2015 0958   WBC 4.4 01/10/2015 0550   RBC 4.32 02/09/2015 0958   RBC 3.26* 01/10/2015 0550   RBC 3.20* 01/09/2015 0646   HGB 13.5 02/09/2015 0958   HGB 10.3* 01/10/2015 0550   HCT 40.6 02/09/2015 0958   HCT 32.0* 01/10/2015 0550   HCT 30.9* 01/09/2015 0646   PLT 165 02/09/2015 0958   PLT 129* 01/10/2015 0550   MCV 94.0 02/09/2015 0958   MCV 98.2 01/10/2015 0550   MCH 31.3 02/09/2015 0958   MCH 31.6 01/10/2015 0550   MCHC 33.3 02/09/2015 0958   MCHC 32.2 01/10/2015 0550   RDW 12.6 02/09/2015 0958   RDW 14.3 01/10/2015 0550   LYMPHSABS 1.7 02/09/2015 0958   LYMPHSABS 1.4 01/10/2015 0550   MONOABS 0.6 02/09/2015 0958   MONOABS 0.5 01/10/2015  0550   EOSABS 0.1 02/09/2015 0958   EOSABS 0.2 01/10/2015 0550   BASOSABS 0.0 02/09/2015 0958   BASOSABS 0.0 01/10/2015 0550    CMP Latest Ref Rng 01/10/2015 01/09/2015 01/08/2015  Glucose 70 - 99 mg/dL 83 92 85  BUN 6 - 23 mg/dL Creatinine 0.50 - 1.10 mg/dL 1.61(W) 9.60(A) 5.40(J)  Sodium 135 - 145 mmol/L 142 137 142  Potassium 3.5 - 5.1 mmol/L 4.7 4.3 5.3(H)  Chloride 96 - 112 mmol/L 112 113(H) 122(H)  CO2 19 - 32 mmol/L Calcium 8.4 - 10.5 mg/dL 8.9 8.5 8.1(X)  Total Protein 6.0 - 8.3 g/dL 9.1(Y) 7.8(G) 4.3(L)  Total Bilirubin 0.3 - 1.2 mg/dL 0.3 0.3 9.5(A)  Alkaline Phos 39 - 117 U/L 54 58 46  AST 0 - 37 U/L ALT 0 - 35 U/L STUDIES:  Imaging Results    Ct Abdomen Pelvis Wo Contrast  01/05/2015 CLINICAL DATA: Low abdominal pain for 1 week EXAM: CT ABDOMEN AND PELVIS WITHOUT CONTRAST TECHNIQUE: Multidetector CT imaging of the abdomen and pelvis was performed following the standard protocol without IV contrast. COMPARISON: 02/11/2010 FINDINGS: Lower chest: Lung bases are clear. Hepatobiliary: Unenhanced liver and gallbladder appear unremarkable. Pancreas: Normal Spleen: Normal Adrenals/Urinary Tract: Adrenal glands are unremarkable. Mild lobulation to the renal cortical contour bilaterally without hydroureteronephrosis. No radiopaque renal or ureteral calculus. The bladder is decompressed but unremarkable. Stomach/Bowel: Stomach and bowel appear normal. Vascular/Lymphatic: Mild atheromatous aortic calcification without aneurysm. No lymphadenopathy. Other: Minimal haziness at the root of the small bowel mesentery is nonspecific but stable since 2011. No ascites or free air. Small fat containing left inguinal hernia. Uterus presumed surgically absent. No adnexal mass. Musculoskeletal: Minimal stranding is noted in the region of the right groin, correlate clinically for any evidence of cellulitis or other skin abnormality. S  shaped thoracolumbar scoliosis is identified. No acute abnormality. IMPRESSION: No acute intra-abdominal or pelvic pathology. Minimal stranding in the right inguinal subcutaneous soft tissues, correlate clinically for any evidence of cellulitis or other dermal abnormality. Electronically Signed By: Christiana Pellant M.D. On: 01/05/2015 17:44   Dg Chest 2 View  12/27/2014 CLINICAL DATA: Initial evaluation for  acute altered mental status, fall. EXAM: CHEST 2 VIEW COMPARISON: Prior study from 06/28/2014 FINDINGS: Cardiac and mediastinal silhouettes are stable in size and contour, and remain within normal limits. Lungs are mildly hypoinflated. No focal infiltrate, pulmonary edema, or pleural effusion. There is no pneumothorax. Scoliosis noted. No acute osseus abnormality. IMPRESSION: No active cardiopulmonary disease. Electronically Signed By: Rise Mu M.D. On: 12/27/2014 00:27   Dg Pelvis 1-2 Views  12/27/2014 CLINICAL DATA: Initial valuation for acute altered mental status. Fall. EXAM: PELVIS - 1-2 VIEW COMPARISON: None. FINDINGS: The femoral heads are normally positioned within the acetabula. Femoral head heights are preserved. No acute fracture or dislocation. Linear lucency traversing the subcapital region of the left femur is stable from prior. No cortical disruption to suggest acute fracture. No pubic diastasis. SI joints are approximated. Degenerative osteoarthrosis noted about the hips bilaterally. Degenerative changes also noted within the lower lumbar spine. No acute soft tissue abnormality. IMPRESSION: No acute fracture or dislocation within the pelvis. Electronically Signed By: Rise Mu M.D. On: 12/27/2014 00:31   Ct Head Wo Contrast  12/27/2014 CLINICAL DATA: Initial evaluation for altered mental status EXAM: CT HEAD WITHOUT CONTRAST TECHNIQUE: Contiguous axial images were obtained from the base of the skull through the vertex  without intravenous contrast. COMPARISON: Prior study from 11/12/2014 FINDINGS: Diffuse prominence of the CSF containing spaces is compatible with generalized cerebral atrophy. Patchy and confluent hypodensity within the periventricular deep white matter most consistent with chronic small vessel ischemic disease. No acute large vessel territory infarct. No mass lesion or midline shift. No intracranial hemorrhage. Basal ganglia calcifications noted. No extra-axial fluid collection. No hydrocephalus. No acute abnormality seen about the orbits. Scalp soft tissues within normal limits. Calvarium intact. Paranasal sinuses are clear. No mastoid effusion. IMPRESSION: 1. No acute intracranial process. 2. Atrophy with advanced chronic microvascular ischemic disease. Electronically Signed By: Rise Mu M.D. On: 12/27/2014 00:21     ASSESSMENT: 74 y.o. [place] woman admitted with persistent nausea, vomiting, diarrhea and weight loss, with progressive cytopenias in early February 2016    PLAN: Addasyn is doing well today. The CBC was reviewed in detail and were entirely normal. No cytopenias noted. I consulted with Dr. Darnelle Catalan regarding this patient's case. At this time, we are led to believe the cytopenias were the result of a viral situation. Now that the virus has been resolved, her blood counts have returned to normal. I have explained this in detail to the patient. She will not require further hematology follow up visits. She will continue to work with her primary care doctor, however we are available to her, should the need arise.   Elli understands and agrees with this plan. She has been encouraged to call should any further issues arise.

## 2015-02-09 NOTE — Progress Notes (Signed)
Checked in new pt with no financial concerns.  Pt has 2 insurance so financial assistance may not be needed but she has my card for any billing questions or concerns. ° °

## 2015-02-10 DIAGNOSIS — R296 Repeated falls: Secondary | ICD-10-CM | POA: Diagnosis not present

## 2015-02-10 DIAGNOSIS — I779 Disorder of arteries and arterioles, unspecified: Secondary | ICD-10-CM | POA: Diagnosis not present

## 2015-02-10 DIAGNOSIS — G4733 Obstructive sleep apnea (adult) (pediatric): Secondary | ICD-10-CM | POA: Diagnosis not present

## 2015-02-10 DIAGNOSIS — F328 Other depressive episodes: Secondary | ICD-10-CM | POA: Diagnosis not present

## 2015-02-10 DIAGNOSIS — I10 Essential (primary) hypertension: Secondary | ICD-10-CM | POA: Diagnosis not present

## 2015-02-10 DIAGNOSIS — R55 Syncope and collapse: Secondary | ICD-10-CM | POA: Diagnosis not present

## 2015-02-14 DIAGNOSIS — I779 Disorder of arteries and arterioles, unspecified: Secondary | ICD-10-CM | POA: Diagnosis not present

## 2015-02-14 DIAGNOSIS — I10 Essential (primary) hypertension: Secondary | ICD-10-CM | POA: Diagnosis not present

## 2015-02-14 DIAGNOSIS — R296 Repeated falls: Secondary | ICD-10-CM | POA: Diagnosis not present

## 2015-02-14 DIAGNOSIS — R55 Syncope and collapse: Secondary | ICD-10-CM | POA: Diagnosis not present

## 2015-02-14 DIAGNOSIS — F328 Other depressive episodes: Secondary | ICD-10-CM | POA: Diagnosis not present

## 2015-02-18 DIAGNOSIS — R296 Repeated falls: Secondary | ICD-10-CM | POA: Diagnosis not present

## 2015-02-18 DIAGNOSIS — I10 Essential (primary) hypertension: Secondary | ICD-10-CM | POA: Diagnosis not present

## 2015-02-18 DIAGNOSIS — F328 Other depressive episodes: Secondary | ICD-10-CM | POA: Diagnosis not present

## 2015-02-18 DIAGNOSIS — I779 Disorder of arteries and arterioles, unspecified: Secondary | ICD-10-CM | POA: Diagnosis not present

## 2015-02-18 DIAGNOSIS — R55 Syncope and collapse: Secondary | ICD-10-CM | POA: Diagnosis not present

## 2015-02-22 DIAGNOSIS — R55 Syncope and collapse: Secondary | ICD-10-CM | POA: Diagnosis not present

## 2015-02-22 DIAGNOSIS — F328 Other depressive episodes: Secondary | ICD-10-CM | POA: Diagnosis not present

## 2015-02-22 DIAGNOSIS — I10 Essential (primary) hypertension: Secondary | ICD-10-CM | POA: Diagnosis not present

## 2015-02-22 DIAGNOSIS — R296 Repeated falls: Secondary | ICD-10-CM | POA: Diagnosis not present

## 2015-02-22 DIAGNOSIS — I779 Disorder of arteries and arterioles, unspecified: Secondary | ICD-10-CM | POA: Diagnosis not present

## 2015-02-24 DIAGNOSIS — R55 Syncope and collapse: Secondary | ICD-10-CM | POA: Diagnosis not present

## 2015-02-24 DIAGNOSIS — I10 Essential (primary) hypertension: Secondary | ICD-10-CM | POA: Diagnosis not present

## 2015-02-24 DIAGNOSIS — R296 Repeated falls: Secondary | ICD-10-CM | POA: Diagnosis not present

## 2015-02-24 DIAGNOSIS — I779 Disorder of arteries and arterioles, unspecified: Secondary | ICD-10-CM | POA: Diagnosis not present

## 2015-02-24 DIAGNOSIS — F328 Other depressive episodes: Secondary | ICD-10-CM | POA: Diagnosis not present

## 2015-02-28 DIAGNOSIS — E039 Hypothyroidism, unspecified: Secondary | ICD-10-CM | POA: Diagnosis not present

## 2015-02-28 DIAGNOSIS — M419 Scoliosis, unspecified: Secondary | ICD-10-CM | POA: Diagnosis not present

## 2015-02-28 DIAGNOSIS — N183 Chronic kidney disease, stage 3 (moderate): Secondary | ICD-10-CM | POA: Diagnosis not present

## 2015-02-28 DIAGNOSIS — R197 Diarrhea, unspecified: Secondary | ICD-10-CM | POA: Diagnosis not present

## 2015-02-28 DIAGNOSIS — R110 Nausea: Secondary | ICD-10-CM | POA: Diagnosis not present

## 2015-02-28 DIAGNOSIS — I129 Hypertensive chronic kidney disease with stage 1 through stage 4 chronic kidney disease, or unspecified chronic kidney disease: Secondary | ICD-10-CM | POA: Diagnosis not present

## 2015-02-28 DIAGNOSIS — I482 Chronic atrial fibrillation: Secondary | ICD-10-CM | POA: Diagnosis not present

## 2015-02-28 DIAGNOSIS — D61818 Other pancytopenia: Secondary | ICD-10-CM | POA: Diagnosis not present

## 2015-02-28 DIAGNOSIS — I471 Supraventricular tachycardia: Secondary | ICD-10-CM | POA: Diagnosis not present

## 2015-02-28 DIAGNOSIS — R296 Repeated falls: Secondary | ICD-10-CM | POA: Diagnosis not present

## 2015-03-03 DIAGNOSIS — I482 Chronic atrial fibrillation: Secondary | ICD-10-CM | POA: Diagnosis not present

## 2015-03-03 DIAGNOSIS — N183 Chronic kidney disease, stage 3 (moderate): Secondary | ICD-10-CM | POA: Diagnosis not present

## 2015-03-03 DIAGNOSIS — R296 Repeated falls: Secondary | ICD-10-CM | POA: Diagnosis not present

## 2015-03-03 DIAGNOSIS — R197 Diarrhea, unspecified: Secondary | ICD-10-CM | POA: Diagnosis not present

## 2015-03-03 DIAGNOSIS — E039 Hypothyroidism, unspecified: Secondary | ICD-10-CM | POA: Diagnosis not present

## 2015-03-03 DIAGNOSIS — D61818 Other pancytopenia: Secondary | ICD-10-CM | POA: Diagnosis not present

## 2015-03-03 DIAGNOSIS — I129 Hypertensive chronic kidney disease with stage 1 through stage 4 chronic kidney disease, or unspecified chronic kidney disease: Secondary | ICD-10-CM | POA: Diagnosis not present

## 2015-03-03 DIAGNOSIS — I471 Supraventricular tachycardia: Secondary | ICD-10-CM | POA: Diagnosis not present

## 2015-03-03 DIAGNOSIS — R110 Nausea: Secondary | ICD-10-CM | POA: Diagnosis not present

## 2015-03-03 DIAGNOSIS — M419 Scoliosis, unspecified: Secondary | ICD-10-CM | POA: Diagnosis not present

## 2015-03-04 ENCOUNTER — Other Ambulatory Visit: Payer: Self-pay | Admitting: Internal Medicine

## 2015-03-07 DIAGNOSIS — I471 Supraventricular tachycardia: Secondary | ICD-10-CM | POA: Diagnosis not present

## 2015-03-07 DIAGNOSIS — R197 Diarrhea, unspecified: Secondary | ICD-10-CM | POA: Diagnosis not present

## 2015-03-07 DIAGNOSIS — I129 Hypertensive chronic kidney disease with stage 1 through stage 4 chronic kidney disease, or unspecified chronic kidney disease: Secondary | ICD-10-CM | POA: Diagnosis not present

## 2015-03-07 DIAGNOSIS — N183 Chronic kidney disease, stage 3 (moderate): Secondary | ICD-10-CM | POA: Diagnosis not present

## 2015-03-07 DIAGNOSIS — R296 Repeated falls: Secondary | ICD-10-CM | POA: Diagnosis not present

## 2015-03-07 DIAGNOSIS — E039 Hypothyroidism, unspecified: Secondary | ICD-10-CM | POA: Diagnosis not present

## 2015-03-07 DIAGNOSIS — M419 Scoliosis, unspecified: Secondary | ICD-10-CM | POA: Diagnosis not present

## 2015-03-07 DIAGNOSIS — R110 Nausea: Secondary | ICD-10-CM | POA: Diagnosis not present

## 2015-03-07 DIAGNOSIS — I482 Chronic atrial fibrillation: Secondary | ICD-10-CM | POA: Diagnosis not present

## 2015-03-07 DIAGNOSIS — D61818 Other pancytopenia: Secondary | ICD-10-CM | POA: Diagnosis not present

## 2015-03-10 DIAGNOSIS — R197 Diarrhea, unspecified: Secondary | ICD-10-CM | POA: Diagnosis not present

## 2015-03-10 DIAGNOSIS — E039 Hypothyroidism, unspecified: Secondary | ICD-10-CM | POA: Diagnosis not present

## 2015-03-10 DIAGNOSIS — R296 Repeated falls: Secondary | ICD-10-CM | POA: Diagnosis not present

## 2015-03-10 DIAGNOSIS — I471 Supraventricular tachycardia: Secondary | ICD-10-CM | POA: Diagnosis not present

## 2015-03-10 DIAGNOSIS — D61818 Other pancytopenia: Secondary | ICD-10-CM | POA: Diagnosis not present

## 2015-03-10 DIAGNOSIS — I482 Chronic atrial fibrillation: Secondary | ICD-10-CM | POA: Diagnosis not present

## 2015-03-10 DIAGNOSIS — M419 Scoliosis, unspecified: Secondary | ICD-10-CM | POA: Diagnosis not present

## 2015-03-10 DIAGNOSIS — I129 Hypertensive chronic kidney disease with stage 1 through stage 4 chronic kidney disease, or unspecified chronic kidney disease: Secondary | ICD-10-CM | POA: Diagnosis not present

## 2015-03-10 DIAGNOSIS — N183 Chronic kidney disease, stage 3 (moderate): Secondary | ICD-10-CM | POA: Diagnosis not present

## 2015-03-10 DIAGNOSIS — R110 Nausea: Secondary | ICD-10-CM | POA: Diagnosis not present

## 2015-03-14 DIAGNOSIS — I129 Hypertensive chronic kidney disease with stage 1 through stage 4 chronic kidney disease, or unspecified chronic kidney disease: Secondary | ICD-10-CM | POA: Diagnosis not present

## 2015-03-14 DIAGNOSIS — D61818 Other pancytopenia: Secondary | ICD-10-CM | POA: Diagnosis not present

## 2015-03-14 DIAGNOSIS — M419 Scoliosis, unspecified: Secondary | ICD-10-CM | POA: Diagnosis not present

## 2015-03-14 DIAGNOSIS — I471 Supraventricular tachycardia: Secondary | ICD-10-CM | POA: Diagnosis not present

## 2015-03-14 DIAGNOSIS — E039 Hypothyroidism, unspecified: Secondary | ICD-10-CM | POA: Diagnosis not present

## 2015-03-14 DIAGNOSIS — R110 Nausea: Secondary | ICD-10-CM | POA: Diagnosis not present

## 2015-03-14 DIAGNOSIS — I482 Chronic atrial fibrillation: Secondary | ICD-10-CM | POA: Diagnosis not present

## 2015-03-14 DIAGNOSIS — N183 Chronic kidney disease, stage 3 (moderate): Secondary | ICD-10-CM | POA: Diagnosis not present

## 2015-03-14 DIAGNOSIS — R197 Diarrhea, unspecified: Secondary | ICD-10-CM | POA: Diagnosis not present

## 2015-03-14 DIAGNOSIS — R296 Repeated falls: Secondary | ICD-10-CM | POA: Diagnosis not present

## 2015-03-16 ENCOUNTER — Other Ambulatory Visit: Payer: Self-pay | Admitting: Cardiovascular Disease

## 2015-03-17 NOTE — Telephone Encounter (Signed)
Diltiazem 30mg  refilled Metoprolol already refilled in Feb 2016 - #30 with 8 refills

## 2015-03-21 DIAGNOSIS — K449 Diaphragmatic hernia without obstruction or gangrene: Secondary | ICD-10-CM | POA: Diagnosis not present

## 2015-03-21 DIAGNOSIS — K219 Gastro-esophageal reflux disease without esophagitis: Secondary | ICD-10-CM | POA: Diagnosis not present

## 2015-03-21 DIAGNOSIS — Z1211 Encounter for screening for malignant neoplasm of colon: Secondary | ICD-10-CM | POA: Diagnosis not present

## 2015-03-21 DIAGNOSIS — K580 Irritable bowel syndrome with diarrhea: Secondary | ICD-10-CM | POA: Diagnosis not present

## 2015-03-23 DIAGNOSIS — M545 Low back pain: Secondary | ICD-10-CM | POA: Diagnosis not present

## 2015-03-23 DIAGNOSIS — M4127 Other idiopathic scoliosis, lumbosacral region: Secondary | ICD-10-CM | POA: Diagnosis not present

## 2015-03-24 DIAGNOSIS — M419 Scoliosis, unspecified: Secondary | ICD-10-CM | POA: Diagnosis not present

## 2015-03-24 DIAGNOSIS — N183 Chronic kidney disease, stage 3 (moderate): Secondary | ICD-10-CM | POA: Diagnosis not present

## 2015-03-24 DIAGNOSIS — D61818 Other pancytopenia: Secondary | ICD-10-CM | POA: Diagnosis not present

## 2015-03-24 DIAGNOSIS — E039 Hypothyroidism, unspecified: Secondary | ICD-10-CM | POA: Diagnosis not present

## 2015-03-24 DIAGNOSIS — I129 Hypertensive chronic kidney disease with stage 1 through stage 4 chronic kidney disease, or unspecified chronic kidney disease: Secondary | ICD-10-CM | POA: Diagnosis not present

## 2015-03-24 DIAGNOSIS — R110 Nausea: Secondary | ICD-10-CM | POA: Diagnosis not present

## 2015-03-24 DIAGNOSIS — I471 Supraventricular tachycardia: Secondary | ICD-10-CM | POA: Diagnosis not present

## 2015-03-24 DIAGNOSIS — R296 Repeated falls: Secondary | ICD-10-CM | POA: Diagnosis not present

## 2015-03-24 DIAGNOSIS — I482 Chronic atrial fibrillation: Secondary | ICD-10-CM | POA: Diagnosis not present

## 2015-03-24 DIAGNOSIS — R197 Diarrhea, unspecified: Secondary | ICD-10-CM | POA: Diagnosis not present

## 2015-03-28 DIAGNOSIS — I129 Hypertensive chronic kidney disease with stage 1 through stage 4 chronic kidney disease, or unspecified chronic kidney disease: Secondary | ICD-10-CM | POA: Diagnosis not present

## 2015-03-28 DIAGNOSIS — I482 Chronic atrial fibrillation: Secondary | ICD-10-CM | POA: Diagnosis not present

## 2015-03-28 DIAGNOSIS — N183 Chronic kidney disease, stage 3 (moderate): Secondary | ICD-10-CM | POA: Diagnosis not present

## 2015-03-28 DIAGNOSIS — R197 Diarrhea, unspecified: Secondary | ICD-10-CM | POA: Diagnosis not present

## 2015-03-28 DIAGNOSIS — D61818 Other pancytopenia: Secondary | ICD-10-CM | POA: Diagnosis not present

## 2015-03-28 DIAGNOSIS — M419 Scoliosis, unspecified: Secondary | ICD-10-CM | POA: Diagnosis not present

## 2015-03-28 DIAGNOSIS — R110 Nausea: Secondary | ICD-10-CM | POA: Diagnosis not present

## 2015-03-28 DIAGNOSIS — I471 Supraventricular tachycardia: Secondary | ICD-10-CM | POA: Diagnosis not present

## 2015-03-28 DIAGNOSIS — E039 Hypothyroidism, unspecified: Secondary | ICD-10-CM | POA: Diagnosis not present

## 2015-03-28 DIAGNOSIS — R296 Repeated falls: Secondary | ICD-10-CM | POA: Diagnosis not present

## 2015-03-31 DIAGNOSIS — I129 Hypertensive chronic kidney disease with stage 1 through stage 4 chronic kidney disease, or unspecified chronic kidney disease: Secondary | ICD-10-CM | POA: Diagnosis not present

## 2015-03-31 DIAGNOSIS — I482 Chronic atrial fibrillation: Secondary | ICD-10-CM | POA: Diagnosis not present

## 2015-03-31 DIAGNOSIS — D61818 Other pancytopenia: Secondary | ICD-10-CM | POA: Diagnosis not present

## 2015-03-31 DIAGNOSIS — R110 Nausea: Secondary | ICD-10-CM | POA: Diagnosis not present

## 2015-03-31 DIAGNOSIS — R296 Repeated falls: Secondary | ICD-10-CM | POA: Diagnosis not present

## 2015-03-31 DIAGNOSIS — R197 Diarrhea, unspecified: Secondary | ICD-10-CM | POA: Diagnosis not present

## 2015-03-31 DIAGNOSIS — N183 Chronic kidney disease, stage 3 (moderate): Secondary | ICD-10-CM | POA: Diagnosis not present

## 2015-03-31 DIAGNOSIS — E039 Hypothyroidism, unspecified: Secondary | ICD-10-CM | POA: Diagnosis not present

## 2015-03-31 DIAGNOSIS — M419 Scoliosis, unspecified: Secondary | ICD-10-CM | POA: Diagnosis not present

## 2015-03-31 DIAGNOSIS — I471 Supraventricular tachycardia: Secondary | ICD-10-CM | POA: Diagnosis not present

## 2015-04-04 DIAGNOSIS — I471 Supraventricular tachycardia: Secondary | ICD-10-CM | POA: Diagnosis not present

## 2015-04-04 DIAGNOSIS — E039 Hypothyroidism, unspecified: Secondary | ICD-10-CM | POA: Diagnosis not present

## 2015-04-04 DIAGNOSIS — R110 Nausea: Secondary | ICD-10-CM | POA: Diagnosis not present

## 2015-04-04 DIAGNOSIS — M419 Scoliosis, unspecified: Secondary | ICD-10-CM | POA: Diagnosis not present

## 2015-04-04 DIAGNOSIS — I482 Chronic atrial fibrillation: Secondary | ICD-10-CM | POA: Diagnosis not present

## 2015-04-04 DIAGNOSIS — R296 Repeated falls: Secondary | ICD-10-CM | POA: Diagnosis not present

## 2015-04-04 DIAGNOSIS — N183 Chronic kidney disease, stage 3 (moderate): Secondary | ICD-10-CM | POA: Diagnosis not present

## 2015-04-04 DIAGNOSIS — D61818 Other pancytopenia: Secondary | ICD-10-CM | POA: Diagnosis not present

## 2015-04-04 DIAGNOSIS — R197 Diarrhea, unspecified: Secondary | ICD-10-CM | POA: Diagnosis not present

## 2015-04-04 DIAGNOSIS — I129 Hypertensive chronic kidney disease with stage 1 through stage 4 chronic kidney disease, or unspecified chronic kidney disease: Secondary | ICD-10-CM | POA: Diagnosis not present

## 2015-04-06 DIAGNOSIS — E43 Unspecified severe protein-calorie malnutrition: Secondary | ICD-10-CM | POA: Diagnosis not present

## 2015-04-06 DIAGNOSIS — R262 Difficulty in walking, not elsewhere classified: Secondary | ICD-10-CM | POA: Diagnosis not present

## 2015-04-06 DIAGNOSIS — I132 Hypertensive heart and chronic kidney disease with heart failure and with stage 5 chronic kidney disease, or end stage renal disease: Secondary | ICD-10-CM | POA: Diagnosis not present

## 2015-04-06 DIAGNOSIS — N189 Chronic kidney disease, unspecified: Secondary | ICD-10-CM | POA: Diagnosis not present

## 2015-04-07 DIAGNOSIS — I129 Hypertensive chronic kidney disease with stage 1 through stage 4 chronic kidney disease, or unspecified chronic kidney disease: Secondary | ICD-10-CM | POA: Diagnosis not present

## 2015-04-07 DIAGNOSIS — D61818 Other pancytopenia: Secondary | ICD-10-CM | POA: Diagnosis not present

## 2015-04-07 DIAGNOSIS — R296 Repeated falls: Secondary | ICD-10-CM | POA: Diagnosis not present

## 2015-04-07 DIAGNOSIS — E039 Hypothyroidism, unspecified: Secondary | ICD-10-CM | POA: Diagnosis not present

## 2015-04-07 DIAGNOSIS — M419 Scoliosis, unspecified: Secondary | ICD-10-CM | POA: Diagnosis not present

## 2015-04-07 DIAGNOSIS — I482 Chronic atrial fibrillation: Secondary | ICD-10-CM | POA: Diagnosis not present

## 2015-04-07 DIAGNOSIS — N183 Chronic kidney disease, stage 3 (moderate): Secondary | ICD-10-CM | POA: Diagnosis not present

## 2015-04-07 DIAGNOSIS — R197 Diarrhea, unspecified: Secondary | ICD-10-CM | POA: Diagnosis not present

## 2015-04-07 DIAGNOSIS — I471 Supraventricular tachycardia: Secondary | ICD-10-CM | POA: Diagnosis not present

## 2015-04-07 DIAGNOSIS — R110 Nausea: Secondary | ICD-10-CM | POA: Diagnosis not present

## 2015-04-14 DIAGNOSIS — N183 Chronic kidney disease, stage 3 (moderate): Secondary | ICD-10-CM | POA: Diagnosis not present

## 2015-04-14 DIAGNOSIS — E039 Hypothyroidism, unspecified: Secondary | ICD-10-CM | POA: Diagnosis not present

## 2015-04-14 DIAGNOSIS — R296 Repeated falls: Secondary | ICD-10-CM | POA: Diagnosis not present

## 2015-04-14 DIAGNOSIS — I482 Chronic atrial fibrillation: Secondary | ICD-10-CM | POA: Diagnosis not present

## 2015-04-14 DIAGNOSIS — I129 Hypertensive chronic kidney disease with stage 1 through stage 4 chronic kidney disease, or unspecified chronic kidney disease: Secondary | ICD-10-CM | POA: Diagnosis not present

## 2015-04-14 DIAGNOSIS — M419 Scoliosis, unspecified: Secondary | ICD-10-CM | POA: Diagnosis not present

## 2015-04-14 DIAGNOSIS — R197 Diarrhea, unspecified: Secondary | ICD-10-CM | POA: Diagnosis not present

## 2015-04-14 DIAGNOSIS — D61818 Other pancytopenia: Secondary | ICD-10-CM | POA: Diagnosis not present

## 2015-04-14 DIAGNOSIS — I471 Supraventricular tachycardia: Secondary | ICD-10-CM | POA: Diagnosis not present

## 2015-04-14 DIAGNOSIS — R110 Nausea: Secondary | ICD-10-CM | POA: Diagnosis not present

## 2015-04-18 DIAGNOSIS — I129 Hypertensive chronic kidney disease with stage 1 through stage 4 chronic kidney disease, or unspecified chronic kidney disease: Secondary | ICD-10-CM | POA: Diagnosis not present

## 2015-04-18 DIAGNOSIS — I482 Chronic atrial fibrillation: Secondary | ICD-10-CM | POA: Diagnosis not present

## 2015-04-18 DIAGNOSIS — D61818 Other pancytopenia: Secondary | ICD-10-CM | POA: Diagnosis not present

## 2015-04-18 DIAGNOSIS — I471 Supraventricular tachycardia: Secondary | ICD-10-CM | POA: Diagnosis not present

## 2015-04-18 DIAGNOSIS — R197 Diarrhea, unspecified: Secondary | ICD-10-CM | POA: Diagnosis not present

## 2015-04-18 DIAGNOSIS — M419 Scoliosis, unspecified: Secondary | ICD-10-CM | POA: Diagnosis not present

## 2015-04-18 DIAGNOSIS — R296 Repeated falls: Secondary | ICD-10-CM | POA: Diagnosis not present

## 2015-04-18 DIAGNOSIS — R110 Nausea: Secondary | ICD-10-CM | POA: Diagnosis not present

## 2015-04-18 DIAGNOSIS — E039 Hypothyroidism, unspecified: Secondary | ICD-10-CM | POA: Diagnosis not present

## 2015-04-18 DIAGNOSIS — N183 Chronic kidney disease, stage 3 (moderate): Secondary | ICD-10-CM | POA: Diagnosis not present

## 2015-04-20 DIAGNOSIS — R110 Nausea: Secondary | ICD-10-CM | POA: Diagnosis not present

## 2015-04-20 DIAGNOSIS — D61818 Other pancytopenia: Secondary | ICD-10-CM | POA: Diagnosis not present

## 2015-04-20 DIAGNOSIS — I129 Hypertensive chronic kidney disease with stage 1 through stage 4 chronic kidney disease, or unspecified chronic kidney disease: Secondary | ICD-10-CM | POA: Diagnosis not present

## 2015-04-20 DIAGNOSIS — R197 Diarrhea, unspecified: Secondary | ICD-10-CM | POA: Diagnosis not present

## 2015-04-20 DIAGNOSIS — M419 Scoliosis, unspecified: Secondary | ICD-10-CM | POA: Diagnosis not present

## 2015-04-20 DIAGNOSIS — E039 Hypothyroidism, unspecified: Secondary | ICD-10-CM | POA: Diagnosis not present

## 2015-04-20 DIAGNOSIS — I471 Supraventricular tachycardia: Secondary | ICD-10-CM | POA: Diagnosis not present

## 2015-04-20 DIAGNOSIS — N183 Chronic kidney disease, stage 3 (moderate): Secondary | ICD-10-CM | POA: Diagnosis not present

## 2015-04-20 DIAGNOSIS — R296 Repeated falls: Secondary | ICD-10-CM | POA: Diagnosis not present

## 2015-04-20 DIAGNOSIS — I482 Chronic atrial fibrillation: Secondary | ICD-10-CM | POA: Diagnosis not present

## 2015-04-27 DIAGNOSIS — R110 Nausea: Secondary | ICD-10-CM | POA: Diagnosis not present

## 2015-04-27 DIAGNOSIS — R197 Diarrhea, unspecified: Secondary | ICD-10-CM | POA: Diagnosis not present

## 2015-04-27 DIAGNOSIS — E039 Hypothyroidism, unspecified: Secondary | ICD-10-CM | POA: Diagnosis not present

## 2015-04-27 DIAGNOSIS — D61818 Other pancytopenia: Secondary | ICD-10-CM | POA: Diagnosis not present

## 2015-04-27 DIAGNOSIS — I482 Chronic atrial fibrillation: Secondary | ICD-10-CM | POA: Diagnosis not present

## 2015-04-27 DIAGNOSIS — M419 Scoliosis, unspecified: Secondary | ICD-10-CM | POA: Diagnosis not present

## 2015-04-27 DIAGNOSIS — I129 Hypertensive chronic kidney disease with stage 1 through stage 4 chronic kidney disease, or unspecified chronic kidney disease: Secondary | ICD-10-CM | POA: Diagnosis not present

## 2015-04-27 DIAGNOSIS — N183 Chronic kidney disease, stage 3 (moderate): Secondary | ICD-10-CM | POA: Diagnosis not present

## 2015-04-27 DIAGNOSIS — R296 Repeated falls: Secondary | ICD-10-CM | POA: Diagnosis not present

## 2015-04-27 DIAGNOSIS — I471 Supraventricular tachycardia: Secondary | ICD-10-CM | POA: Diagnosis not present

## 2015-04-28 DIAGNOSIS — I482 Chronic atrial fibrillation: Secondary | ICD-10-CM | POA: Diagnosis not present

## 2015-04-28 DIAGNOSIS — D61818 Other pancytopenia: Secondary | ICD-10-CM | POA: Diagnosis not present

## 2015-04-28 DIAGNOSIS — M419 Scoliosis, unspecified: Secondary | ICD-10-CM | POA: Diagnosis not present

## 2015-04-28 DIAGNOSIS — N183 Chronic kidney disease, stage 3 (moderate): Secondary | ICD-10-CM | POA: Diagnosis not present

## 2015-04-28 DIAGNOSIS — R197 Diarrhea, unspecified: Secondary | ICD-10-CM | POA: Diagnosis not present

## 2015-04-28 DIAGNOSIS — R296 Repeated falls: Secondary | ICD-10-CM | POA: Diagnosis not present

## 2015-04-28 DIAGNOSIS — I471 Supraventricular tachycardia: Secondary | ICD-10-CM | POA: Diagnosis not present

## 2015-04-28 DIAGNOSIS — I129 Hypertensive chronic kidney disease with stage 1 through stage 4 chronic kidney disease, or unspecified chronic kidney disease: Secondary | ICD-10-CM | POA: Diagnosis not present

## 2015-04-28 DIAGNOSIS — R110 Nausea: Secondary | ICD-10-CM | POA: Diagnosis not present

## 2015-04-28 DIAGNOSIS — E039 Hypothyroidism, unspecified: Secondary | ICD-10-CM | POA: Diagnosis not present

## 2015-05-05 ENCOUNTER — Other Ambulatory Visit: Payer: Self-pay | Admitting: Internal Medicine

## 2015-05-08 ENCOUNTER — Other Ambulatory Visit: Payer: Self-pay | Admitting: Internal Medicine

## 2015-05-08 NOTE — Telephone Encounter (Signed)
Last refill on klonopin 11/25/14 #60 x 5 refills Please advise on refill. thanks

## 2015-05-08 NOTE — Telephone Encounter (Signed)
Ok to refill 

## 2015-05-09 ENCOUNTER — Telehealth: Payer: Self-pay | Admitting: Internal Medicine

## 2015-05-09 NOTE — Telephone Encounter (Signed)
Per refill request sent 05/08/15: Waymon Budge, MD at 05/08/2015 5:12 PM     Status: Signed       Expand All Collapse All   Ok to refill            Tommie Sams, CMA at 05/08/2015 2:55 PM     Status: Signed       Expand All Collapse All   Last refill on klonopin 11/25/14 #60 x 5 refills Please advise on refill. thanks      --   Pt aware RX has been called in. Nothing further neeed

## 2015-05-10 DIAGNOSIS — D638 Anemia in other chronic diseases classified elsewhere: Secondary | ICD-10-CM | POA: Diagnosis not present

## 2015-05-10 DIAGNOSIS — I129 Hypertensive chronic kidney disease with stage 1 through stage 4 chronic kidney disease, or unspecified chronic kidney disease: Secondary | ICD-10-CM | POA: Diagnosis not present

## 2015-05-10 DIAGNOSIS — N2581 Secondary hyperparathyroidism of renal origin: Secondary | ICD-10-CM | POA: Diagnosis not present

## 2015-05-10 DIAGNOSIS — N183 Chronic kidney disease, stage 3 (moderate): Secondary | ICD-10-CM | POA: Diagnosis not present

## 2015-05-11 DIAGNOSIS — R413 Other amnesia: Secondary | ICD-10-CM | POA: Diagnosis not present

## 2015-05-21 ENCOUNTER — Other Ambulatory Visit: Payer: Self-pay | Admitting: Internal Medicine

## 2015-05-29 ENCOUNTER — Other Ambulatory Visit: Payer: Self-pay | Admitting: Internal Medicine

## 2015-05-29 NOTE — Telephone Encounter (Signed)
CY Please advise on refill. Thanks.  

## 2015-05-29 NOTE — Telephone Encounter (Signed)
Ok to refill one year 

## 2015-06-12 DIAGNOSIS — I2510 Atherosclerotic heart disease of native coronary artery without angina pectoris: Secondary | ICD-10-CM | POA: Diagnosis not present

## 2015-06-12 DIAGNOSIS — Z111 Encounter for screening for respiratory tuberculosis: Secondary | ICD-10-CM | POA: Diagnosis not present

## 2015-06-12 DIAGNOSIS — N189 Chronic kidney disease, unspecified: Secondary | ICD-10-CM | POA: Diagnosis not present

## 2015-06-12 DIAGNOSIS — I132 Hypertensive heart and chronic kidney disease with heart failure and with stage 5 chronic kidney disease, or end stage renal disease: Secondary | ICD-10-CM | POA: Diagnosis not present

## 2015-07-06 ENCOUNTER — Other Ambulatory Visit: Payer: Self-pay | Admitting: Internal Medicine

## 2015-07-06 ENCOUNTER — Telehealth: Payer: Self-pay | Admitting: Internal Medicine

## 2015-07-06 MED ORDER — CLONAZEPAM 1 MG PO TABS: ORAL_TABLET | ORAL | Status: DC

## 2015-07-06 NOTE — Telephone Encounter (Signed)
Pt is needing a refill on Clonazepam. Last OV was on 10/05/14. Has pending OV on 10/06/15. Last refill was on 05/09/15 >> Clonazepam  1-2 qhs #60 with 1 refill.  CY - please advise. Thanks.

## 2015-07-06 NOTE — Telephone Encounter (Signed)
Ok to refill 

## 2015-07-06 NOTE — Telephone Encounter (Signed)
RX called in and pt is aware. Nothing further needed

## 2015-08-22 ENCOUNTER — Telehealth: Payer: Self-pay | Admitting: Cardiovascular Disease

## 2015-08-22 NOTE — Telephone Encounter (Signed)
Cythina is calling because she is wanting to know whether there is a heart failure diagnosis for this patient , so that she can enroll her or not into the heart failure program ..Please call

## 2015-08-22 NOTE — Telephone Encounter (Signed)
LMTCB

## 2015-08-23 NOTE — Telephone Encounter (Signed)
Please call her tomorrow

## 2015-08-24 NOTE — Telephone Encounter (Signed)
Called cynthia and left voice mail;

## 2015-08-25 NOTE — Telephone Encounter (Signed)
Returning your call from yesterday.Does pt have a dx of Congested Heart Failure,pt says she does not have it.

## 2015-08-25 NOTE — Telephone Encounter (Signed)
Spoke with [name], aware pt does not have heart failure.

## 2015-08-29 DIAGNOSIS — J45909 Unspecified asthma, uncomplicated: Secondary | ICD-10-CM | POA: Diagnosis not present

## 2015-08-29 DIAGNOSIS — M810 Age-related osteoporosis without current pathological fracture: Secondary | ICD-10-CM | POA: Diagnosis not present

## 2015-08-29 DIAGNOSIS — I480 Paroxysmal atrial fibrillation: Secondary | ICD-10-CM | POA: Diagnosis not present

## 2015-08-29 DIAGNOSIS — F411 Generalized anxiety disorder: Secondary | ICD-10-CM | POA: Diagnosis not present

## 2015-08-29 DIAGNOSIS — I10 Essential (primary) hypertension: Secondary | ICD-10-CM | POA: Diagnosis not present

## 2015-08-30 ENCOUNTER — Telehealth: Payer: Self-pay | Admitting: Internal Medicine

## 2015-08-30 DIAGNOSIS — Z79899 Other long term (current) drug therapy: Secondary | ICD-10-CM | POA: Diagnosis not present

## 2015-08-30 NOTE — Telephone Encounter (Signed)
Called and spoke with Zella BallRobin at pt's assist living facility Zella BallRobin stated that pt had enough Klonopin to last her until Monday evening of next week Pt does need a refill on this medication i explained to nurse that CY is out of town until next week and refill could be done then Nurse stated that was fine and requested that verbal order be called into [facility]F pharmacy at [phone]. Ask to speak to Steward DroneBrenda   Will send to Dr Maple HudsonYoung and Florentina AddisonKatie to ensure follow up

## 2015-08-31 DIAGNOSIS — N183 Chronic kidney disease, stage 3 (moderate): Secondary | ICD-10-CM | POA: Diagnosis not present

## 2015-08-31 DIAGNOSIS — F0390 Unspecified dementia without behavioral disturbance: Secondary | ICD-10-CM | POA: Diagnosis not present

## 2015-08-31 DIAGNOSIS — R2689 Other abnormalities of gait and mobility: Secondary | ICD-10-CM | POA: Diagnosis not present

## 2015-08-31 DIAGNOSIS — M419 Scoliosis, unspecified: Secondary | ICD-10-CM | POA: Diagnosis not present

## 2015-08-31 DIAGNOSIS — I129 Hypertensive chronic kidney disease with stage 1 through stage 4 chronic kidney disease, or unspecified chronic kidney disease: Secondary | ICD-10-CM | POA: Diagnosis not present

## 2015-08-31 MED ORDER — CLONAZEPAM 1 MG PO TABS: ORAL_TABLET | ORAL | Status: DC

## 2015-08-31 NOTE — Telephone Encounter (Signed)
Will forward to DOD as CY is not back in the office until Wednesday 09-06-15; patient will run out before CY returns. Thanks.

## 2015-08-31 NOTE — Telephone Encounter (Signed)
Per SN okay to refill klonopin 1 po qhs prn sleep #30 x 0 refills I have called this into the pharmacy below. Nothing further needed

## 2015-09-01 DIAGNOSIS — I129 Hypertensive chronic kidney disease with stage 1 through stage 4 chronic kidney disease, or unspecified chronic kidney disease: Secondary | ICD-10-CM | POA: Diagnosis not present

## 2015-09-01 DIAGNOSIS — F0390 Unspecified dementia without behavioral disturbance: Secondary | ICD-10-CM | POA: Diagnosis not present

## 2015-09-01 DIAGNOSIS — R2689 Other abnormalities of gait and mobility: Secondary | ICD-10-CM | POA: Diagnosis not present

## 2015-09-01 DIAGNOSIS — N183 Chronic kidney disease, stage 3 (moderate): Secondary | ICD-10-CM | POA: Diagnosis not present

## 2015-09-01 DIAGNOSIS — M419 Scoliosis, unspecified: Secondary | ICD-10-CM | POA: Diagnosis not present

## 2015-09-05 DIAGNOSIS — I10 Essential (primary) hypertension: Secondary | ICD-10-CM | POA: Diagnosis not present

## 2015-09-05 DIAGNOSIS — I480 Paroxysmal atrial fibrillation: Secondary | ICD-10-CM | POA: Diagnosis not present

## 2015-09-05 DIAGNOSIS — N183 Chronic kidney disease, stage 3 (moderate): Secondary | ICD-10-CM | POA: Diagnosis not present

## 2015-09-06 DIAGNOSIS — M419 Scoliosis, unspecified: Secondary | ICD-10-CM | POA: Diagnosis not present

## 2015-09-06 DIAGNOSIS — R2689 Other abnormalities of gait and mobility: Secondary | ICD-10-CM | POA: Diagnosis not present

## 2015-09-06 DIAGNOSIS — N183 Chronic kidney disease, stage 3 (moderate): Secondary | ICD-10-CM | POA: Diagnosis not present

## 2015-09-06 DIAGNOSIS — I129 Hypertensive chronic kidney disease with stage 1 through stage 4 chronic kidney disease, or unspecified chronic kidney disease: Secondary | ICD-10-CM | POA: Diagnosis not present

## 2015-09-06 DIAGNOSIS — F0390 Unspecified dementia without behavioral disturbance: Secondary | ICD-10-CM | POA: Diagnosis not present

## 2015-09-08 ENCOUNTER — Telehealth: Payer: Self-pay | Admitting: Internal Medicine

## 2015-09-08 DIAGNOSIS — R2689 Other abnormalities of gait and mobility: Secondary | ICD-10-CM | POA: Diagnosis not present

## 2015-09-08 DIAGNOSIS — F0390 Unspecified dementia without behavioral disturbance: Secondary | ICD-10-CM | POA: Diagnosis not present

## 2015-09-08 DIAGNOSIS — I129 Hypertensive chronic kidney disease with stage 1 through stage 4 chronic kidney disease, or unspecified chronic kidney disease: Secondary | ICD-10-CM | POA: Diagnosis not present

## 2015-09-08 DIAGNOSIS — N183 Chronic kidney disease, stage 3 (moderate): Secondary | ICD-10-CM | POA: Diagnosis not present

## 2015-09-08 DIAGNOSIS — M419 Scoliosis, unspecified: Secondary | ICD-10-CM | POA: Diagnosis not present

## 2015-09-08 NOTE — Telephone Encounter (Signed)
Cordelia PenSherry calling back to give pt number to [place], [phone]

## 2015-09-08 NOTE — Telephone Encounter (Signed)
They signed some kind of contract with the care facility. Suggest they review that agreement with her primary care physician to clarify medication management.

## 2015-09-08 NOTE — Telephone Encounter (Signed)
Pt's daughter in law says that pt was put [facility].  Pt says that they are giving her 1 Klonipin daily.  Restless legs medication, she should be getting 2 pills, but is only getting 1 pill daily.  Pt says that the provider at the home is taking over her medications and she didn't think that he should be able to do this.  Advised daughter in law that [facility] this, but that I could not talk to her directly about this issue because [place].  Pt would have to put her on DPR.

## 2015-09-08 NOTE — Telephone Encounter (Signed)
LM with Cordelia PenSherry for pt to cb.

## 2015-09-08 NOTE — Telephone Encounter (Signed)
Called and spoke to Pt, she said that she checked into asst living.  She said that [name] has been her provider x 3 years and he prescribed her Klonipin.  She said she is taking 2, 1mg  tablets at bedtime; Requip (2) at night.  Pt wakes up refreshed, has not fallen, appetite better.  But since she has been there, she dropped [name] as her PCP and has switched to [name], at the nursing home.  [name] changed her medication to (1) Requip and (1) Klonipin every evening.  She says this does nothing to help her.  She says she will lay in bed until 4-5 in the AM, she feels very exhausted during the day.  She said that she told [name] she is still seeing [name] for her sleep issues.  Pt states that [name] needs orders for medications to be faxed to [name] at the home in order for her to stay on her current dose of medications.  I spoke to pt's nurse: [name], nursing care manager, she advised me that they have pts medications straightened out and that pt will be receiving 2mg  Klonipin tablet daily and  1mg  of Requip daily.   Pt advised that medications have been straightened out and I advised pt that if she wants me to be able to talk to [name] (her daughter in law) I would need her to put her name on the DPR when she comes back for her next OV.  Pt verbalized understanding, Nothing further needed.

## 2015-09-12 DIAGNOSIS — M419 Scoliosis, unspecified: Secondary | ICD-10-CM | POA: Diagnosis not present

## 2015-09-12 DIAGNOSIS — F0390 Unspecified dementia without behavioral disturbance: Secondary | ICD-10-CM | POA: Diagnosis not present

## 2015-09-12 DIAGNOSIS — R2689 Other abnormalities of gait and mobility: Secondary | ICD-10-CM | POA: Diagnosis not present

## 2015-09-12 DIAGNOSIS — N183 Chronic kidney disease, stage 3 (moderate): Secondary | ICD-10-CM | POA: Diagnosis not present

## 2015-09-12 DIAGNOSIS — I129 Hypertensive chronic kidney disease with stage 1 through stage 4 chronic kidney disease, or unspecified chronic kidney disease: Secondary | ICD-10-CM | POA: Diagnosis not present

## 2015-09-13 DIAGNOSIS — F0390 Unspecified dementia without behavioral disturbance: Secondary | ICD-10-CM | POA: Diagnosis not present

## 2015-09-13 DIAGNOSIS — N183 Chronic kidney disease, stage 3 (moderate): Secondary | ICD-10-CM | POA: Diagnosis not present

## 2015-09-13 DIAGNOSIS — I129 Hypertensive chronic kidney disease with stage 1 through stage 4 chronic kidney disease, or unspecified chronic kidney disease: Secondary | ICD-10-CM | POA: Diagnosis not present

## 2015-09-13 DIAGNOSIS — R2689 Other abnormalities of gait and mobility: Secondary | ICD-10-CM | POA: Diagnosis not present

## 2015-09-13 DIAGNOSIS — M419 Scoliosis, unspecified: Secondary | ICD-10-CM | POA: Diagnosis not present

## 2015-09-14 DIAGNOSIS — I129 Hypertensive chronic kidney disease with stage 1 through stage 4 chronic kidney disease, or unspecified chronic kidney disease: Secondary | ICD-10-CM | POA: Diagnosis not present

## 2015-09-14 DIAGNOSIS — F0390 Unspecified dementia without behavioral disturbance: Secondary | ICD-10-CM | POA: Diagnosis not present

## 2015-09-14 DIAGNOSIS — R2689 Other abnormalities of gait and mobility: Secondary | ICD-10-CM | POA: Diagnosis not present

## 2015-09-14 DIAGNOSIS — N183 Chronic kidney disease, stage 3 (moderate): Secondary | ICD-10-CM | POA: Diagnosis not present

## 2015-09-14 DIAGNOSIS — M419 Scoliosis, unspecified: Secondary | ICD-10-CM | POA: Diagnosis not present

## 2015-09-18 DIAGNOSIS — I129 Hypertensive chronic kidney disease with stage 1 through stage 4 chronic kidney disease, or unspecified chronic kidney disease: Secondary | ICD-10-CM | POA: Diagnosis not present

## 2015-09-18 DIAGNOSIS — M419 Scoliosis, unspecified: Secondary | ICD-10-CM | POA: Diagnosis not present

## 2015-09-18 DIAGNOSIS — R2689 Other abnormalities of gait and mobility: Secondary | ICD-10-CM | POA: Diagnosis not present

## 2015-09-18 DIAGNOSIS — F0390 Unspecified dementia without behavioral disturbance: Secondary | ICD-10-CM | POA: Diagnosis not present

## 2015-09-18 DIAGNOSIS — N183 Chronic kidney disease, stage 3 (moderate): Secondary | ICD-10-CM | POA: Diagnosis not present

## 2015-09-19 DIAGNOSIS — Z23 Encounter for immunization: Secondary | ICD-10-CM | POA: Diagnosis not present

## 2015-09-21 DIAGNOSIS — F0390 Unspecified dementia without behavioral disturbance: Secondary | ICD-10-CM | POA: Diagnosis not present

## 2015-09-21 DIAGNOSIS — I129 Hypertensive chronic kidney disease with stage 1 through stage 4 chronic kidney disease, or unspecified chronic kidney disease: Secondary | ICD-10-CM | POA: Diagnosis not present

## 2015-09-21 DIAGNOSIS — N183 Chronic kidney disease, stage 3 (moderate): Secondary | ICD-10-CM | POA: Diagnosis not present

## 2015-09-21 DIAGNOSIS — R2689 Other abnormalities of gait and mobility: Secondary | ICD-10-CM | POA: Diagnosis not present

## 2015-09-21 DIAGNOSIS — M419 Scoliosis, unspecified: Secondary | ICD-10-CM | POA: Diagnosis not present

## 2015-09-28 DIAGNOSIS — N183 Chronic kidney disease, stage 3 (moderate): Secondary | ICD-10-CM | POA: Diagnosis not present

## 2015-09-28 DIAGNOSIS — I129 Hypertensive chronic kidney disease with stage 1 through stage 4 chronic kidney disease, or unspecified chronic kidney disease: Secondary | ICD-10-CM | POA: Diagnosis not present

## 2015-09-28 DIAGNOSIS — M419 Scoliosis, unspecified: Secondary | ICD-10-CM | POA: Diagnosis not present

## 2015-09-28 DIAGNOSIS — R2689 Other abnormalities of gait and mobility: Secondary | ICD-10-CM | POA: Diagnosis not present

## 2015-09-28 DIAGNOSIS — F0390 Unspecified dementia without behavioral disturbance: Secondary | ICD-10-CM | POA: Diagnosis not present

## 2015-10-01 ENCOUNTER — Emergency Department (HOSPITAL_COMMUNITY): Payer: Medicare Other

## 2015-10-01 ENCOUNTER — Emergency Department (HOSPITAL_COMMUNITY)
Admission: EM | Admit: 2015-10-01 | Discharge: 2015-10-01 | Disposition: A | Discharge status: Home / Self Care | Payer: Medicare Other | Attending: Emergency Medicine | Admitting: Emergency Medicine

## 2015-10-01 ENCOUNTER — Encounter (HOSPITAL_COMMUNITY): Payer: Self-pay

## 2015-10-01 DIAGNOSIS — Z79899 Other long term (current) drug therapy: Secondary | ICD-10-CM | POA: Insufficient documentation

## 2015-10-01 DIAGNOSIS — E039 Hypothyroidism, unspecified: Secondary | ICD-10-CM | POA: Insufficient documentation

## 2015-10-01 DIAGNOSIS — S3289XA Fracture of other parts of pelvis, initial encounter for closed fracture: Secondary | ICD-10-CM | POA: Diagnosis not present

## 2015-10-01 DIAGNOSIS — S7002XA Contusion of left hip, initial encounter: Secondary | ICD-10-CM | POA: Diagnosis not present

## 2015-10-01 DIAGNOSIS — F329 Major depressive disorder, single episode, unspecified: Secondary | ICD-10-CM | POA: Diagnosis not present

## 2015-10-01 DIAGNOSIS — S300XXA Contusion of lower back and pelvis, initial encounter: Secondary | ICD-10-CM | POA: Diagnosis not present

## 2015-10-01 DIAGNOSIS — S3992XA Unspecified injury of lower back, initial encounter: Secondary | ICD-10-CM | POA: Diagnosis not present

## 2015-10-01 DIAGNOSIS — S79912A Unspecified injury of left hip, initial encounter: Secondary | ICD-10-CM | POA: Diagnosis not present

## 2015-10-01 DIAGNOSIS — S79911A Unspecified injury of right hip, initial encounter: Secondary | ICD-10-CM | POA: Diagnosis present

## 2015-10-01 DIAGNOSIS — N183 Chronic kidney disease, stage 3 (moderate): Secondary | ICD-10-CM | POA: Insufficient documentation

## 2015-10-01 DIAGNOSIS — Z8739 Personal history of other diseases of the musculoskeletal system and connective tissue: Secondary | ICD-10-CM | POA: Diagnosis not present

## 2015-10-01 DIAGNOSIS — Y9389 Activity, other specified: Secondary | ICD-10-CM | POA: Diagnosis not present

## 2015-10-01 DIAGNOSIS — S7001XA Contusion of right hip, initial encounter: Secondary | ICD-10-CM | POA: Diagnosis not present

## 2015-10-01 DIAGNOSIS — Y998 Other external cause status: Secondary | ICD-10-CM | POA: Insufficient documentation

## 2015-10-01 DIAGNOSIS — Z8719 Personal history of other diseases of the digestive system: Secondary | ICD-10-CM | POA: Insufficient documentation

## 2015-10-01 DIAGNOSIS — M545 Low back pain: Secondary | ICD-10-CM | POA: Diagnosis not present

## 2015-10-01 DIAGNOSIS — M25511 Pain in right shoulder: Secondary | ICD-10-CM | POA: Diagnosis not present

## 2015-10-01 DIAGNOSIS — T07XXXA Unspecified multiple injuries, initial encounter: Secondary | ICD-10-CM

## 2015-10-01 DIAGNOSIS — W06XXXA Fall from bed, initial encounter: Secondary | ICD-10-CM | POA: Diagnosis not present

## 2015-10-01 DIAGNOSIS — F0390 Unspecified dementia without behavioral disturbance: Secondary | ICD-10-CM | POA: Diagnosis not present

## 2015-10-01 DIAGNOSIS — Y9289 Other specified places as the place of occurrence of the external cause: Secondary | ICD-10-CM | POA: Diagnosis not present

## 2015-10-01 DIAGNOSIS — Z7952 Long term (current) use of systemic steroids: Secondary | ICD-10-CM | POA: Diagnosis not present

## 2015-10-01 DIAGNOSIS — T148 Other injury of unspecified body region: Secondary | ICD-10-CM | POA: Insufficient documentation

## 2015-10-01 DIAGNOSIS — J45909 Unspecified asthma, uncomplicated: Secondary | ICD-10-CM | POA: Insufficient documentation

## 2015-10-01 DIAGNOSIS — R102 Pelvic and perineal pain: Secondary | ICD-10-CM | POA: Diagnosis not present

## 2015-10-01 DIAGNOSIS — I129 Hypertensive chronic kidney disease with stage 1 through stage 4 chronic kidney disease, or unspecified chronic kidney disease: Secondary | ICD-10-CM | POA: Insufficient documentation

## 2015-10-01 DIAGNOSIS — F419 Anxiety disorder, unspecified: Secondary | ICD-10-CM | POA: Diagnosis not present

## 2015-10-01 DIAGNOSIS — S34109A Unspecified injury to unspecified level of lumbar spinal cord, initial encounter: Secondary | ICD-10-CM | POA: Diagnosis not present

## 2015-10-01 MED ORDER — OXYCODONE-ACETAMINOPHEN 5-325 MG PO TABS
1.0000 | ORAL_TABLET | Freq: Once | ORAL | Status: AC
Start: 2015-10-01 — End: 2015-10-01
  Administered 2015-10-01: 1 via ORAL
  Filled 2015-10-01: qty 1

## 2015-10-01 MED ORDER — OXYCODONE-ACETAMINOPHEN 5-325 MG PO TABS: 1.0000 | ORAL_TABLET | ORAL | Status: DC | PRN

## 2015-10-01 MED ORDER — HYDROCODONE-ACETAMINOPHEN 5-325 MG PO TABS
1.0000 | ORAL_TABLET | Freq: Once | ORAL | Status: AC
  Administered 2015-10-01: 1 via ORAL
  Filled 2015-10-01: qty 1

## 2015-10-01 MED ORDER — OXYCODONE-ACETAMINOPHEN 5-325 MG PO TABS: 1.0000 | ORAL_TABLET | Freq: Four times a day (QID) | ORAL | Status: DC | PRN

## 2015-10-01 MED ORDER — ONDANSETRON 8 MG PO TBDP
8.0000 mg | ORAL_TABLET | Freq: Once | ORAL | Status: AC
  Administered 2015-10-01: 8 mg via ORAL
  Filled 2015-10-01: qty 1

## 2015-10-01 NOTE — ED Notes (Signed)
[facility] has been notified for [facility].  Pt. Is in no distress.

## 2015-10-01 NOTE — Discharge Instructions (Signed)

## 2015-10-01 NOTE — ED Notes (Signed)
She states that as she reached for something, she fell out of bed onto her right side.  She c/o pain in right shoulder/arm/elbow and bilat. Hips/pelvis area.  She is awake, alert and oriented x 4 with clear speech.  CMS intact all extremities.

## 2015-10-01 NOTE — ED Provider Notes (Signed)
CSN: [id number]     Arrival date & time 10/01/15  0935 History   First MD Initiated Contact with Patient 10/01/15 [phone]     Chief Complaint  Patient presents with  . Fall     (Consider location/radiation/quality/duration/timing/severity/associated sxs/prior Treatment) HPI Comments: Patient here after she fell for a bed this morning. She was reaching for a object and couldn't obtain it and fell onto the right side of her body. No head injury no loss of consciousness. Denies any neck pain. No chest or abdominal pain. Complains of sharp bilateral hip and pelvis pain with associated lower lumbar pain since the fall. Pain worse with movement better with rest. No treatment used prior to arrival. Denies any saddle anesthesias. No numbness or tingling to her legs. Denies any recent illnesses.  Patient is a 74 y.o. female presenting with fall. The history is provided by the patient.  Fall    Past Medical History  Diagnosis Date  . Hypertension   . Abnormal heart rhythm   . Osteoporosis   . Scoliosis   . Afib (HCC)     h/o  . Reactive airway disease   . Depression   . Echocardiogram abnormal 02/26/11    MVP,mild MR,AOV mildly sclerotic. EF >55%  . Dementia   . Chronic kidney disease (CKD), stage III (moderate)     [name] 01/05/2015  . Hypothyroidism   . Sleep apnea     "suppose to wear a mask; can't tolerate it" (01/05/2015)  . GERD (gastroesophageal reflux disease)   . Arthritis     "hips, shoulders" (01/05/2015)  . Anxiety    Past Surgical History  Procedure Laterality Date  . Tubal ligation    . Cesarean section  1967  . Cesarean section  1971  . Open reduction internal fixation (orif) distal radial fracture Left 12/2009    [name] 12/28/2009  . Total knee arthroplasty Left 03/2010    [name] 04/07/2010  . Joint replacement    . Vaginal hysterectomy  1980  . Dilation and curettage of uterus    . Cataract extraction, bilateral Bilateral    Family History  Problem Relation Age of  Onset  . Heart disease Father   . Cancer Father   . Cancer Mother    Social History  Substance Use Topics  . Smoking status: Never Smoker   . Smokeless tobacco: Never Used  . Alcohol Use: No   OB History    No data available     Review of Systems  All other systems reviewed and are negative.     Allergies  Review of patient's allergies indicates no known allergies.  Home Medications   Prior to Admission medications   Medication Sig Start Date End Date Taking? Authorizing Provider  Budesonide-Formoterol Fumarate (SYMBICORT IN) Inhale 1-2 puffs into the lungs as needed (shortness of breath).    Historical Provider, MD  calcitRIOL (ROCALTROL) 0.25 MCG capsule Take 0.25 mcg by mouth. Mondays,Wednesdays, and Fridays    Historical Provider, MD  clonazePAM (KLONOPIN) 1 MG tablet TAKE 1 TABLET BY MOUTH at bedtime as needed for sleep 08/31/15   Michele Mcalpine, MD  diltiazem (CARDIZEM CD) 180 MG 24 hr capsule Take 1 capsule (180 mg total) by mouth daily. 01/09/15   Richarda Overlie, MD  diltiazem (CARDIZEM) 30 MG tablet TAKE 1 TABLET (30 MG TOTAL) BY MOUTH EVERY 8 (EIGHT) HOURS AS NEEDED (FOR SUSTAINED PALPITATIONS). 11/21/14   Mihai Croitoru, MD  diltiazem (CARDIZEM) 30 MG tablet TAKE 1 TABLET (  30 MG TOTAL) BY MOUTH EVERY 8 (EIGHT) HOURS AS NEEDED (FOR SUSTAINED PALPITATIONS). 03/17/15   Mihai Croitoru, MD  feeding supplement, ENSURE COMPLETE, (ENSURE COMPLETE) LIQD Take 237 mLs by mouth 2 (two) times daily between meals. 01/09/15   Richarda OverlieNayana Abrol, MD  ferrous fumarate (HEMOCYTE - 106 MG FE) 325 (106 FE) MG TABS Take 1 tablet by mouth daily.    Historical Provider, MD  HYDROcodone-homatropine (HYCODAN) 5-1.5 MG/5ML syrup Take 5 mLs by mouth 4 (four) times daily. 01/10/15   Richarda OverlieNayana Abrol, MD  hydrocortisone (ANUSOL-HC) 2.5 % rectal cream Apply topically 2 (two) times daily. 01/09/15   Richarda OverlieNayana Abrol, MD  levothyroxine (SYNTHROID, LEVOTHROID) 112 MCG tablet Take 112 mcg by mouth daily before breakfast.     Historical Provider, MD  LYRICA 50 MG capsule Take 50 mg by mouth 2 (two) times daily.  08/24/14   Historical Provider, MD  metoprolol tartrate (LOPRESSOR) 25 MG tablet Take 0.5 tablets (12.5 mg total) by mouth 2 (two) times daily. 12/28/14   Mihai Croitoru, MD  omega-3 acid ethyl esters (LOVAZA) 1 G capsule Take 1 g by mouth daily.      Historical Provider, MD  ondansetron (ZOFRAN ODT) 4 MG disintegrating tablet Take 1 tablet (4 mg total) by mouth every 8 (eight) hours as needed for nausea. 4mg  ODT q4 hours prn nausea/vomit 06/28/14   Lorenda HatchetAdam L Rothman, MD  potassium chloride SA (K-DUR,KLOR-CON) 20 MEQ tablet Take 20 mEq by mouth daily.      Historical Provider, MD  rOPINIRole (REQUIP) 0.5 MG tablet TAKE 1 TO 2 TABLETS BY MOUTH AT BEDTIME FOR RESTLESS LEGS 05/23/15   Waymon Budgelinton D Young, MD  rOPINIRole (REQUIP) 0.5 MG tablet TAKE 1 TO 2 TABLETS BY MOUTH AT BEDTIME FOR RESTLESS LEGS 05/30/15   Waymon Budgelinton D Young, MD  saccharomyces boulardii (FLORASTOR) 250 MG capsule Take 1 capsule (250 mg total) by mouth 2 (two) times daily. 01/09/15   Richarda OverlieNayana Abrol, MD  sertraline (ZOLOFT) 100 MG tablet Take 150 mg by mouth daily.    Historical Provider, MD  Vitamin D, Ergocalciferol, (DRISDOL) 50000 UNITS CAPS capsule Take 50,000 Units by mouth every 7 (seven) days. Mondays    Historical Provider, MD   BP 125/57 mmHg  Pulse 71  Temp(Src) 98.8 F (37.1 C) (Oral)  Resp 16  SpO2 99% Physical Exam  Constitutional: She is oriented to person, place, and time. She appears well-developed and well-nourished.  Non-toxic appearance. No distress.  HENT:  Head: Normocephalic and atraumatic.  Eyes: Conjunctivae, EOM and lids are normal. Pupils are equal, round, and reactive to light.  Neck: Normal range of motion. Neck supple. No spinous process tenderness and no muscular tenderness present. No tracheal deviation present. No thyroid mass present.  Cardiovascular: Normal rate, regular rhythm and normal heart sounds.  Exam reveals no  gallop.   No murmur heard. Pulmonary/Chest: Effort normal and breath sounds normal. No stridor. No respiratory distress. She has no decreased breath sounds. She has no wheezes. She has no rhonchi. She has no rales.  Abdominal: Soft. Normal appearance and bowel sounds are normal. She exhibits no distension. There is no tenderness. There is no rebound and no CVA tenderness.  Musculoskeletal: Normal range of motion. She exhibits no edema or tenderness.       Back:  Full range of motion in all the joints. No shortening or rotation noted. Neurovascular status intact  Neurological: She is alert and oriented to person, place, and time. She has normal strength. No cranial  nerve deficit or sensory deficit. GCS eye subscore is 4. GCS verbal subscore is 5. GCS motor subscore is 6.  Skin: Skin is warm and dry. No abrasion and no rash noted.  Psychiatric: She has a normal mood and affect. Her speech is normal and behavior is normal.  Nursing note and vitals reviewed.   ED Course  Procedures (including critical care time) Labs Review Labs Reviewed - No data to display  Imaging Review No results found. I have personally reviewed and evaluated these images and lab results as part of my medical decision-making.   EKG Interpretation None      MDM   Final diagnoses:  None    Patient given pain meds here feels better. No evidence of acute fracture. Will be discharged home    [name], MD 10/01/15 1141

## 2015-10-02 ENCOUNTER — Other Ambulatory Visit: Payer: Self-pay | Admitting: Internal Medicine

## 2015-10-02 NOTE — Telephone Encounter (Signed)
CY Please advise if okay to refill. Thanks.  

## 2015-10-03 DIAGNOSIS — N183 Chronic kidney disease, stage 3 (moderate): Secondary | ICD-10-CM | POA: Diagnosis not present

## 2015-10-03 DIAGNOSIS — I10 Essential (primary) hypertension: Secondary | ICD-10-CM | POA: Diagnosis not present

## 2015-10-03 DIAGNOSIS — I480 Paroxysmal atrial fibrillation: Secondary | ICD-10-CM | POA: Diagnosis not present

## 2015-10-03 NOTE — Telephone Encounter (Signed)
Ok to refill 

## 2015-10-04 ENCOUNTER — Other Ambulatory Visit: Payer: Self-pay | Admitting: Cardiovascular Disease

## 2015-10-04 NOTE — Telephone Encounter (Signed)
Rx request sent to pharmacy.  

## 2015-10-06 ENCOUNTER — Ambulatory Visit (INDEPENDENT_AMBULATORY_CARE_PROVIDER_SITE_OTHER): Payer: Medicare Other | Admitting: Internal Medicine

## 2015-10-06 ENCOUNTER — Encounter: Payer: Self-pay | Admitting: Internal Medicine

## 2015-10-06 VITALS — BP 120/66 | HR 68 | Ht 62.75 in | Wt 142.4 lb

## 2015-10-06 DIAGNOSIS — G2581 Restless legs syndrome: Secondary | ICD-10-CM

## 2015-10-06 DIAGNOSIS — G4700 Insomnia, unspecified: Secondary | ICD-10-CM | POA: Diagnosis not present

## 2015-10-06 DIAGNOSIS — G4730 Sleep apnea, unspecified: Secondary | ICD-10-CM | POA: Diagnosis not present

## 2015-10-06 DIAGNOSIS — G4733 Obstructive sleep apnea (adult) (pediatric): Secondary | ICD-10-CM

## 2015-10-06 NOTE — Assessment & Plan Note (Signed)
She describes satisfactory control with Requip at bedtime

## 2015-10-06 NOTE — Patient Instructions (Addendum)
Order- [facility]-  Continue CPAP 9, mask of choice (she is interested in nasal pillows), humidifier, supplies, AirView                           Dx OSA   Note new address  You could ask the doctor at [facility] about seeing a neurologist for your memory concerns

## 2015-10-06 NOTE — Assessment & Plan Note (Signed)
Continues CPAP 9. Plan-request download. Authorize change of mask

## 2015-10-06 NOTE — Progress Notes (Signed)
Patient ID: Ana FillersJudy C Foster, female    DOB: 1941-04-12, 74 y.o.   MRN: [id number] HPI 07/18/11- 74 year old female never smoker referred courtesy of Dr. Parke SimmersBland for sleep evaluation. In the last 6 years and she has had difficulty initiating and maintaining sleep. She has tried white noise Ambien(caused sleep driving), Lunesta (tried only once, not helpful), Xanax (insufficient). As a sleepwalker as a child. Says she always feels "uptight" but not threatened. She does admit some financial stress. Review of sleep hygiene indicates bedtime around 11:30 PM with sleep latency at least one hour and repeated waking during the night if she sleeps at all. She gets up between 4 and 5 AM. She has not been told she snores. Can never fall asleep in the daytime. Caffeine is limited to 2 soft drinks daily. Avoided alcohol and drugs herself but says both parents were alcoholics and 2 of her 3 brothers were alcoholics who took their own lives.  He is history of chronic renal insufficiency but is doing well without dialysis. ENT evaluation by Dr.Teoh because of vertigo/falls. An MRI from that evaluation showed "lesions on the brain" . She does not know a formal ENT or neurologic diagnosis. NPSG 09/18/2005 showed Epworth score 7/24, moderate obstructive sleep apnea, AHI 19.8 per hour with moderate to loud snoring, desaturation to 89% on room air and difficulty initiating sleep until 1:30 AM. She had a CPAP machine but never got comfortable with the mask which hurt her mouth. She has not used CPAP in years.FOLLOWS FOR: takes approx 1 hour to fall asleep withuse of Cloazepam and wakes up several times; when using Alprazolam 3 qhs able to sleep through the night no troubles(states Dr Parke SimmersBland is trying to get patient off of RX)  09/02/11- 69 yoF followed for obstructive sleep apnea. Since last here found Sonata did not help for sleep. Auto titration showed good CPAP compliance and pressure control at 12 using a nasal pillows mask. She  notices sleep walking and sleeping eating. She has been an occasional sleepwalker since childhood.  10/14/11-  [age] yoF never smoker followed for obstructive sleep apnea. CPAP at 9 CWP did well for her. She did have the home care company check it when she thought it wasn't working as well. She then left it at home when she traveled for a few days. During that time she came to appreciate how much it really helped. Since then she is much more comfortable with CPAP, using it all night every night. She still has trouble initiating sleep and describes lying down and trying to Will herself to sleep. I discussed sleep hygiene and her clonazepam.  12/17/11-  69 yoF never smoker followed for obstructive sleep apnea. Takes approx 1 hour to fall asleep with use of Clonazepam and wakes up several times; when using Alprazolam 3 qhs able to sleep through the night no troubles(states Dr Parke SimmersBland is trying to get patient off of RX) There was some kind of a spell associated with marked agitation after a family argument. [facility]. Dr. Parke SimmersBland is sending her to neurology for memory evaluation and wants her off of the alprazolam. Unfortunately the patient feels alprazolam worked much better than clonazepam for chronic insomnia. She denies any sense of overmedication or sedation from these meds, carrying over into daytime. She continues CPAP 9 CWP/ [facility], all night every night with no report of breakthrough snoring or unusual behavior during sleep.  02/17/12-  [age] yoF never smoker followed for obstructive  sleep apnea. We reviewed her lack of success with several meds dealing with chronic difficulty initiating and maintaining sleep. She never went to neurology as intended before by Dr Parke SimmersBland. Bedtime 10:30 to 11 PM. Sleep latency is much is a few hours. Wakes again within an hour or 2 every night. Xanax had helped this but Dr. Parke SimmersBland has tried to wean her off. Trazodone never worked at all. She continues  good compliance and control with CPAP 9/[facility], all night, every night.  04/14/12-  69 yoF never smoker followed for obstructive sleep apnea. Never saw Dr [id number]($[id number]($60 copay and was too much); needs something else to help sleep([facility] was $15 copay) cant afford as she is on fixed income-doesn't feel like depression is cause of not sleeping. I explained that depression wasn't thought to be the issue. I had hoped cognitive behavioral therapy could be an alternative medication management of insomnia. She is now taking melatonin but it is not helpful. She failed Benadryl and other over-the-counter sleep aids. She chose not to go to neurologist as referred by her primary physician after some kind of episode that may have been behavioral  10/19/12-71 yoF never smoker followed for obstructive sleep apnea, insomnia FOLLOWS FOR: feels like clonazepam is helping(still wakes up around 2am to 3am) is able to return to sleep if stays still.. 1 mg clonazepam works well for sleep if she allows one hour sleep latency after taking it. Occasional waking 2 or 3 AM then can't get back to sleep CPAP 9/[facility] is worn all night every night and comfortable.  04/01/13- 71 yoF never smoker followed for obstructive sleep apnea, insomnia FOLLOWS FOR: not falling asleep as well as before and has a hard time going back to sleep when waking up in middle of the night; take 3-4 hours for clonazepam to "kick" in. Longer sleep latency. Admits increased stress related to Pittsalimony issues from ex-husband. We talked about her insomnia problems as really just an extension of this. She continues CPAP 9/[facility]  10/04/13- 71 yoF never smoker followed for obstructive sleep apnea, insomnia FOLLOWS FOR: Has noticed she is sleep walking-finds things are out of place in the mornings-lives alone.  History of sleep walking between ages 89 and 74 years old. Leg movement in sleep disturbs  the covers and may wake her. Denies syncope, headache, numbness. Says  CPAP 9/[facility] is comfortable and not a problem.  04/04/14- 71 yoF never smoker followed for obstructive sleep apnea, insomnia,  FOLLOWS FOR: Pt wears CPAP 9 /[facility] every night for about 6 hours; DME is [facility]. Pt has been taking 2 (1mg ) tablets of Clonazepam to help with sleep. Continues to take Requip for RLS. Tolerates Lyrica w/o problem.  10/05/14- 72 yoF never smoker followed for obstructive sleep apnea, insomnia, Restless Legs FOLLOWS FOR: Wears CPAP 9/[facility] every night through [facility]-will need to get DL and enroll in Airview. Had flu vax Always insomnia- clonazepam helps.  Life-long sleep walker. No injury. Requip helps restless legs  10/06/15- 74 year old female never smoker followed for obstructive sleep apnea, insomnia, restless legs, complicated by PAT, stage III kidney disease, HBP CPAP 9/[facility] FOLLOWS FOR:[facility] 1-2 weeks ago); DME is [facility] for now-patient is interested in nasal pillows mask. Wears CPAP most everry night Has moved to assisted living after repeated falls and so far is very happy there. Continues Requip for restless legs and clonazepam for insomnia. Continues CPAP. She would like to change to nasal pillows mask. Describes compliance is good.  Review of  Systems-see HPI Constitutional:   No-   weight loss, night sweats, fevers, chills, fatigue, lassitude. HEENT:   + headaches, No-difficulty swallowing, tooth/dental problems, sore throat,       No-  sneezing, itching, ear ache, nasal congestion, post nasal drip,  CV:  No-   chest pain, orthopnea, PND, swelling in lower extremities, anasarca, Dizziness,+ palpitations Resp: No-   shortness of breath with exertion or at rest.              No-   productive cough,  No non-productive cough,  No-  coughing up of blood.              No-   change in color of mucus.  No- wheezing.   Skin: No-   rash or lesions. GI:  No-   heartburn, indigestion, abdominal pain, nausea, vomiting,  GU:  MS:  +  joint  pain or swelling.  Neuro- as per HPI  Psych:  No- change in mood or affect. + depression and anxiety.  No memory loss.  Objective:   Physical Exam General- Alert, Oriented, Affect-appropriate, oriented and calm,  Distress- none acute. Medium build Skin- rash-none, lesions- none, excoriation- none Lymphadenopathy- none Head- atraumatic            Eyes- Gross vision intact, PERRLA, conjunctivae clear secretions            Ears- Hearing, canals-normal            Nose- Clear, no-Septal dev, mucus, polyps, erosion, perforation             Throat- Mallampati IV , mucosa clear , drainage- none, tonsils- atrophic, dentures Neck- flexible , trachea midline, no stridor , thyroid nl, carotid no bruit Chest - symmetrical excursion , unlabored           Heart/CV- RRR , no murmur , no gallop  , no rub, nl s1 s2                           - JVD- none , edema- none, stasis changes- none, varices- none           Lung- clear to P&A, wheeze- none, cough + dry , dullness-none, rub- none           Chest wall-  Abd- Br/ Gen/ Rectal- Not done, not indicated Extrem- cyanosis- none, clubbing, none, atrophy- none, strength- nl. +Cane Neuro- grossly intact to observation

## 2015-10-08 ENCOUNTER — Emergency Department (HOSPITAL_COMMUNITY): Payer: Medicare Other

## 2015-10-08 ENCOUNTER — Emergency Department (HOSPITAL_COMMUNITY)
Admission: EM | Admit: 2015-10-08 | Discharge: 2015-10-08 | Disposition: A | Discharge status: Home / Self Care | Payer: Medicare Other | Attending: Emergency Medicine | Admitting: Emergency Medicine

## 2015-10-08 ENCOUNTER — Encounter (HOSPITAL_COMMUNITY): Payer: Self-pay | Admitting: Emergency Medicine

## 2015-10-08 DIAGNOSIS — F0390 Unspecified dementia without behavioral disturbance: Secondary | ICD-10-CM | POA: Diagnosis not present

## 2015-10-08 DIAGNOSIS — N183 Chronic kidney disease, stage 3 (moderate): Secondary | ICD-10-CM | POA: Insufficient documentation

## 2015-10-08 DIAGNOSIS — J45909 Unspecified asthma, uncomplicated: Secondary | ICD-10-CM | POA: Diagnosis not present

## 2015-10-08 DIAGNOSIS — R51 Headache: Secondary | ICD-10-CM | POA: Diagnosis not present

## 2015-10-08 DIAGNOSIS — M1990 Unspecified osteoarthritis, unspecified site: Secondary | ICD-10-CM | POA: Insufficient documentation

## 2015-10-08 DIAGNOSIS — Y9384 Activity, sleeping: Secondary | ICD-10-CM | POA: Diagnosis not present

## 2015-10-08 DIAGNOSIS — Z79899 Other long term (current) drug therapy: Secondary | ICD-10-CM | POA: Diagnosis not present

## 2015-10-08 DIAGNOSIS — S0990XA Unspecified injury of head, initial encounter: Secondary | ICD-10-CM | POA: Diagnosis present

## 2015-10-08 DIAGNOSIS — S0003XA Contusion of scalp, initial encounter: Secondary | ICD-10-CM | POA: Diagnosis not present

## 2015-10-08 DIAGNOSIS — W19XXXA Unspecified fall, initial encounter: Secondary | ICD-10-CM

## 2015-10-08 DIAGNOSIS — W06XXXA Fall from bed, initial encounter: Secondary | ICD-10-CM | POA: Diagnosis not present

## 2015-10-08 DIAGNOSIS — Z9889 Other specified postprocedural states: Secondary | ICD-10-CM | POA: Diagnosis not present

## 2015-10-08 DIAGNOSIS — Y998 Other external cause status: Secondary | ICD-10-CM | POA: Diagnosis not present

## 2015-10-08 DIAGNOSIS — F329 Major depressive disorder, single episode, unspecified: Secondary | ICD-10-CM | POA: Diagnosis not present

## 2015-10-08 DIAGNOSIS — Z8719 Personal history of other diseases of the digestive system: Secondary | ICD-10-CM | POA: Insufficient documentation

## 2015-10-08 DIAGNOSIS — I129 Hypertensive chronic kidney disease with stage 1 through stage 4 chronic kidney disease, or unspecified chronic kidney disease: Secondary | ICD-10-CM | POA: Insufficient documentation

## 2015-10-08 DIAGNOSIS — Z8669 Personal history of other diseases of the nervous system and sense organs: Secondary | ICD-10-CM | POA: Insufficient documentation

## 2015-10-08 DIAGNOSIS — F419 Anxiety disorder, unspecified: Secondary | ICD-10-CM | POA: Diagnosis not present

## 2015-10-08 DIAGNOSIS — Y92128 Other place in nursing home as the place of occurrence of the external cause: Secondary | ICD-10-CM | POA: Insufficient documentation

## 2015-10-08 DIAGNOSIS — E039 Hypothyroidism, unspecified: Secondary | ICD-10-CM | POA: Diagnosis not present

## 2015-10-08 DIAGNOSIS — Z791 Long term (current) use of non-steroidal anti-inflammatories (NSAID): Secondary | ICD-10-CM | POA: Diagnosis not present

## 2015-10-08 NOTE — ED Notes (Addendum)
Wrong entry at 0231.

## 2015-10-08 NOTE — ED Notes (Signed)
[facility] here to transport pt back to [facility] facility.

## 2015-10-08 NOTE — ED Notes (Signed)
Brought in by [facility] from [facility] with c/o headache after her fall this morning.  Per EMS, pt was found lying on hardwood floor beside bed; pt stated, "I was in the bed and the next thing I knew I was on the floor".   Pt sustained a large hematoma to forehead after the fall, and pt c/o headache.  No other complaints.  Pt not on any blood thinners or anticoagulants.

## 2015-10-08 NOTE — ED Provider Notes (Signed)
CSN: [id number]     Arrival date & time 10/08/15  0100 History  By signing my name below, I, [name], attest that this documentation has been prepared under the direction and in the presence of Tomasita CrumbleAdeleke Orit Sanville, MD. Electronically Signed: Phillis HaggisGabriella Foster, ED Scribe. 10/08/2015. 1:22 AM.     Chief Complaint  Patient presents with  . Fall  . Head Injury   The history is provided by the patient. No language interpreter was used.  HPI Comments: [name] is a 74 y.o. Female with a hx of osteoporosis, CKD, and dementia brought in by [facility] who presents to the Emergency Department complaining of a fall occurring this morning. Pt is a resident at [facility] and had a fall earlier this morning. Per EMS, pt was found laying on the hardwood floor next to her bed and states that the pt does not know how she ended up on the floor. She says, "I was asleep and the next thing I know, I'm on the floor." She states that she had assistance getting up from the floor. Pt is currently complaining of a headache. She denies pain anywhere else, vomiting, diarrhea, numbness, or weakness. Pt is not on blood thinners or anti-coagulants.   Past Medical History  Diagnosis Date  . Hypertension   . Abnormal heart rhythm   . Osteoporosis   . Scoliosis   . Afib (HCC)     h/o  . Reactive airway disease   . Depression   . Echocardiogram abnormal 02/26/11    MVP,mild MR,AOV mildly sclerotic. EF >55%  . Dementia   . Chronic kidney disease (CKD), stage III (moderate)     [name]/notes 01/05/2015  . Hypothyroidism   . Sleep apnea     "suppose to wear a mask; can't tolerate it" (01/05/2015)  . GERD (gastroesophageal reflux disease)   . Arthritis     "hips, shoulders" (01/05/2015)  . Anxiety    Past Surgical History  Procedure Laterality Date  . Tubal ligation    . Cesarean section  1967  . Cesarean section  1971  . Open reduction internal fixation (orif) distal radial fracture Left 12/2009    [name]/notes 12/28/2009  .  Total knee arthroplasty Left 03/2010    [name]/notes 04/07/2010  . Joint replacement    . Vaginal hysterectomy  1980  . Dilation and curettage of uterus    . Cataract extraction, bilateral Bilateral    Family History  Problem Relation Age of Onset  . Heart disease Father   . Cancer Father   . Cancer Mother    Social History  Substance Use Topics  . Smoking status: Never Smoker   . Smokeless tobacco: Never Used  . Alcohol Use: No   OB History    No data available     Review of Systems 10 Systems reviewed and all are negative for acute change except as noted in the HPI. Allergies  Vicodin  Home Medications   Prior to Admission medications   Medication Sig Start Date End Date Taking? Authorizing Provider  calcitRIOL (ROCALTROL) 0.25 MCG capsule Take 0.25 mcg by mouth. Mondays,Wednesdays, and Fridays    Historical Provider, MD  clonazePAM (KLONOPIN) 2 MG tablet Take 2 mg by mouth at bedtime.    Historical Provider, MD  diclofenac sodium (VOLTAREN) 1 % GEL Apply 2 g topically 4 (four) times daily.    Historical Provider, MD  diltiazem (CARDIZEM) 30 MG tablet TAKE 1 TABLET (30 MG TOTAL) BY MOUTH EVERY  8 (EIGHT) HOURS AS NEEDED (FOR SUSTAINED PALPITATIONS). 11/21/14   Mihai Croitoru, MD  diltiazem (DILACOR XR) 240 MG 24 hr capsule Take 240 mg by mouth daily.    Historical Provider, MD  levothyroxine (SYNTHROID, LEVOTHROID) 112 MCG tablet Take 112 mcg by mouth daily before breakfast.    Historical Provider, MD  metoprolol tartrate (LOPRESSOR) 25 MG tablet Take 25 mg by mouth daily.    Historical Provider, MD  omega-3 acid ethyl esters (LOVAZA) 1 G capsule Take 1 g by mouth daily.      Historical Provider, MD  ondansetron (ZOFRAN ODT) 4 MG disintegrating tablet Take 1 tablet (4 mg total) by mouth every 8 (eight) hours as needed for nausea.  ODT q4 hours prn nausea/vomit 06/28/14   [name], MD  oxyCODONE-acetaminophen (PERCOCET/ROXICET) 5-325 MG tablet Take 1 tablet by mouth every 4  (four) hours as needed for severe pain. 10/01/15   [name], MD  potassium chloride SA (K-DUR,KLOR-CON) 20 MEQ tablet Take 20 mEq by mouth daily.      Historical Provider, MD  pregabalin (LYRICA) 100 MG capsule Take 100 mg by mouth at bedtime.    Historical Provider, MD  rOPINIRole (REQUIP) 1 MG tablet Take 1 mg by mouth at bedtime.    Historical Provider, MD  sertraline (ZOLOFT) 100 MG tablet Take 150 mg by mouth daily.    Historical Provider, MD  traMADol (ULTRAM) 50 MG tablet Take 50 mg by mouth 2 (two) times daily.    Historical Provider, MD  Vitamin D, Ergocalciferol, (DRISDOL) 50000 UNITS CAPS capsule Take 50,000 Units by mouth every 7 (seven) days. Mondays    Historical Provider, MD   BP 138/62 mmHg  Pulse 65  Temp(Src) 98.3 F (36.8 C) (Oral)  Resp 16  SpO2 97% Physical Exam  Constitutional: She is oriented to person, place, and time. She appears well-developed and well-nourished. No distress.  HENT:  Head: Normocephalic.  Nose: Nose normal.  Mouth/Throat: Oropharynx is clear and moist. No oropharyngeal exudate.  L frontal scalp hematoma  Eyes: Conjunctivae and EOM are normal. Pupils are equal, round, and reactive to light. No scleral icterus.  Neck: Normal range of motion. Neck supple. No JVD present. No tracheal deviation present. No thyromegaly present.  Cardiovascular: Normal rate, regular rhythm and normal heart sounds.  Exam reveals no gallop and no friction rub.   No murmur heard. Pulmonary/Chest: Effort normal and breath sounds normal. No respiratory distress. She has no wheezes. She exhibits no tenderness.  Abdominal: Soft. Bowel sounds are normal. She exhibits no distension and no mass. There is no tenderness. There is no rebound and no guarding.  Musculoskeletal: Normal range of motion. She exhibits no edema or tenderness.  Lymphadenopathy:    She has no cervical adenopathy.  Neurological: She is alert and oriented to person, place, and time. No cranial nerve  deficit. She exhibits normal muscle tone.  Normal strength and sensation in all extremities; normal cerebellar testing  Skin: Skin is warm and dry. No rash noted. No erythema. No pallor.  Nursing note and vitals reviewed.   ED Course  Procedures (including critical care time) DIAGNOSTIC STUDIES: Oxygen Saturation is 97% on RA, normal by my interpretation.    COORDINATION OF CARE: 1:47 AM-Discussed treatment plan which includes CT scan with pt at bedside and pt agreed to plan.   Labs Review Labs Reviewed - No data to display  Imaging Review Ct Head Wo Contrast  10/08/2015  CLINICAL DATA:  Fall earlier this  morning. Headache with frontal hematoma. EXAM: CT HEAD WITHOUT CONTRAST TECHNIQUE: Contiguous axial images were obtained from the base of the skull through the vertex without intravenous contrast. COMPARISON:  Head CT 12/27/2014 FINDINGS: Left frontal scalp hematoma without subjacent fracture. No intracranial hemorrhage, mass effect, or midline shift. Generalized atrophy and advanced chronic small vessel ischemic change, stable in appearance from prior. No hydrocephalus. The basilar cisterns are patent. No evidence of territorial infarct. No intracranial fluid collection. Calvarium is intact. Included paranasal sinuses and mastoid air cells are well aerated. IMPRESSION: 1. Left frontal scalp hematoma without subjacent fracture or acute intracranial abnormality. 2. Stable atrophy and advanced chronic small vessel ischemia. Electronically Signed   By: Rubye Oaks M.D.   On: 10/08/2015 02:05   I have personally reviewed and evaluated these images and lab results as part of my medical decision-making.   EKG Interpretation None      MDM   Final diagnoses:  None   Patient presents to the emergency department after falling out of bed. CT scan of the head is negative for intracranial abnormality. She is denying pain elsewhere. Her neurological exam is normal.  She is not taking any  blood thinners. She appears well and in no acute distress, process within her normal limits and she is safe for discharge.   I personally performed the services described in this documentation, which was scribed in my presence. The recorded information has been reviewed and is accurate.      Tomasita Crumble, MD 10/08/15 [id number]

## 2015-10-08 NOTE — ED Notes (Signed)
Bed: [id number] Expected date: 10/08/15 Expected time: 12:52 AM Means of arrival: Ambulance Comments: 74 yo F  Fall, head injury

## 2015-10-08 NOTE — Discharge Instructions (Signed)
Fall Prevention in the Home  [name], your CT scan today is normal. See primary care physician within 3 days for close follow-up. If any symptoms worsen come back to emergency department immediately. Thank you. Falls can cause injuries. They can happen to people of all ages. There are many things you can do to make your home safe and to help prevent falls.  WHAT CAN I DO ON THE OUTSIDE OF MY HOME?  Regularly fix the edges of walkways and driveways and fix any cracks.  Remove anything that might make you trip as you walk through a door, such as a raised step or threshold.  Trim any bushes or trees on the path to your home.  Use bright outdoor lighting.  Clear any walking paths of anything that might make someone trip, such as rocks or tools.  Regularly check to see if handrails are loose or broken. Make sure that both sides of any steps have handrails.  Any raised decks and porches should have guardrails on the edges.  Have any leaves, snow, or ice cleared regularly.  Use sand or salt on walking paths during winter.  Clean up any spills in your garage right away. This includes oil or grease spills. WHAT CAN I DO IN THE BATHROOM?   Use night lights.  Install grab bars by the toilet and in the tub and shower. Do not use towel bars as grab bars.  Use non-skid mats or decals in the tub or shower.  If you need to sit down in the shower, use a plastic, non-slip stool.  Keep the floor dry. Clean up any water that spills on the floor as soon as it happens.  Remove soap buildup in the tub or shower regularly.  Attach bath mats securely with double-sided non-slip rug tape.  Do not have throw rugs and other things on the floor that can make you trip. WHAT CAN I DO IN THE BEDROOM?  Use night lights.  Make sure that you have a light by your bed that is easy to reach.  Do not use any sheets or blankets that are too big for your bed. They should not hang down onto the  floor.  Have a firm chair that has side arms. You can use this for support while you get dressed.  Do not have throw rugs and other things on the floor that can make you trip. WHAT CAN I DO IN THE KITCHEN?  Clean up any spills right away.  Avoid walking on wet floors.  Keep items that you use a lot in easy-to-reach places.  If you need to reach something above you, use a strong step stool that has a grab bar.  Keep electrical cords out of the way.  Do not use floor polish or wax that makes floors slippery. If you must use wax, use non-skid floor wax.  Do not have throw rugs and other things on the floor that can make you trip. WHAT CAN I DO WITH MY STAIRS?  Do not leave any items on the stairs.  Make sure that there are handrails on both sides of the stairs and use them. Fix handrails that are broken or loose. Make sure that handrails are as long as the stairways.  Check any carpeting to make sure that it is firmly attached to the stairs. Fix any carpet that is loose or worn.  Avoid having throw rugs at the top or bottom of the stairs. If you do have throw  rugs, attach them to the floor with carpet tape.  Make sure that you have a light switch at the top of the stairs and the bottom of the stairs. If you do not have them, ask someone to add them for you. WHAT ELSE CAN I DO TO HELP PREVENT FALLS?  Wear shoes that:  Do not have high heels.  Have rubber bottoms.  Are comfortable and fit you well.  Are closed at the toe. Do not wear sandals.  If you use a stepladder:  Make sure that it is fully opened. Do not climb a closed stepladder.  Make sure that both sides of the stepladder are locked into place.  Ask someone to hold it for you, if possible.  Clearly mark and make sure that you can see:  Any grab bars or handrails.  First and last steps.  Where the edge of each step is.  Use tools that help you move around (mobility aids) if they are needed. These  include:  Canes.  Walkers.  Scooters.  Crutches.  Turn on the lights when you go into a dark area. Replace any light bulbs as soon as they burn out.  Set up your furniture so you have a clear path. Avoid moving your furniture around.  If any of your floors are uneven, fix them.  If there are any pets around you, be aware of where they are.  Review your medicines with your doctor. Some medicines can make you feel dizzy. This can increase your chance of falling. Ask your doctor what other things that you can do to help prevent falls.   This information is not intended to replace advice given to you by your health care provider. Make sure you discuss any questions you have with your health care provider.   Document Released: 08/31/2009 Document Revised: 03/21/2015 Document Reviewed: 12/09/2014 Elsevier Interactive Patient Education [employer]

## 2015-10-08 NOTE — ED Notes (Signed)
[facility] was called for pt's transportation back to [facility].

## 2015-10-10 DIAGNOSIS — R2689 Other abnormalities of gait and mobility: Secondary | ICD-10-CM | POA: Diagnosis not present

## 2015-10-10 DIAGNOSIS — N183 Chronic kidney disease, stage 3 (moderate): Secondary | ICD-10-CM | POA: Diagnosis not present

## 2015-10-10 DIAGNOSIS — I129 Hypertensive chronic kidney disease with stage 1 through stage 4 chronic kidney disease, or unspecified chronic kidney disease: Secondary | ICD-10-CM | POA: Diagnosis not present

## 2015-10-10 DIAGNOSIS — G4733 Obstructive sleep apnea (adult) (pediatric): Secondary | ICD-10-CM | POA: Diagnosis not present

## 2015-10-10 DIAGNOSIS — F0390 Unspecified dementia without behavioral disturbance: Secondary | ICD-10-CM | POA: Diagnosis not present

## 2015-10-10 DIAGNOSIS — M419 Scoliosis, unspecified: Secondary | ICD-10-CM | POA: Diagnosis not present

## 2015-10-17 DIAGNOSIS — N183 Chronic kidney disease, stage 3 (moderate): Secondary | ICD-10-CM | POA: Diagnosis not present

## 2015-10-17 DIAGNOSIS — M545 Low back pain: Secondary | ICD-10-CM | POA: Diagnosis not present

## 2015-10-17 DIAGNOSIS — E039 Hypothyroidism, unspecified: Secondary | ICD-10-CM | POA: Diagnosis not present

## 2015-10-17 DIAGNOSIS — I10 Essential (primary) hypertension: Secondary | ICD-10-CM | POA: Diagnosis not present

## 2015-10-25 DIAGNOSIS — F0390 Unspecified dementia without behavioral disturbance: Secondary | ICD-10-CM | POA: Diagnosis not present

## 2015-10-25 DIAGNOSIS — M419 Scoliosis, unspecified: Secondary | ICD-10-CM | POA: Diagnosis not present

## 2015-10-25 DIAGNOSIS — R2689 Other abnormalities of gait and mobility: Secondary | ICD-10-CM | POA: Diagnosis not present

## 2015-10-25 DIAGNOSIS — I129 Hypertensive chronic kidney disease with stage 1 through stage 4 chronic kidney disease, or unspecified chronic kidney disease: Secondary | ICD-10-CM | POA: Diagnosis not present

## 2015-10-25 DIAGNOSIS — N183 Chronic kidney disease, stage 3 (moderate): Secondary | ICD-10-CM | POA: Diagnosis not present

## 2015-10-26 DIAGNOSIS — I129 Hypertensive chronic kidney disease with stage 1 through stage 4 chronic kidney disease, or unspecified chronic kidney disease: Secondary | ICD-10-CM | POA: Diagnosis not present

## 2015-10-26 DIAGNOSIS — M419 Scoliosis, unspecified: Secondary | ICD-10-CM | POA: Diagnosis not present

## 2015-10-26 DIAGNOSIS — F0390 Unspecified dementia without behavioral disturbance: Secondary | ICD-10-CM | POA: Diagnosis not present

## 2015-10-26 DIAGNOSIS — R2689 Other abnormalities of gait and mobility: Secondary | ICD-10-CM | POA: Diagnosis not present

## 2015-10-26 DIAGNOSIS — N183 Chronic kidney disease, stage 3 (moderate): Secondary | ICD-10-CM | POA: Diagnosis not present

## 2015-10-30 DIAGNOSIS — M545 Low back pain: Secondary | ICD-10-CM | POA: Diagnosis not present

## 2015-10-31 DIAGNOSIS — I4891 Unspecified atrial fibrillation: Secondary | ICD-10-CM | POA: Diagnosis not present

## 2015-10-31 DIAGNOSIS — M545 Low back pain: Secondary | ICD-10-CM | POA: Diagnosis not present

## 2015-10-31 DIAGNOSIS — F0390 Unspecified dementia without behavioral disturbance: Secondary | ICD-10-CM | POA: Diagnosis not present

## 2015-10-31 DIAGNOSIS — M810 Age-related osteoporosis without current pathological fracture: Secondary | ICD-10-CM | POA: Diagnosis not present

## 2015-10-31 DIAGNOSIS — M1991 Primary osteoarthritis, unspecified site: Secondary | ICD-10-CM | POA: Diagnosis not present

## 2015-11-01 DIAGNOSIS — I4891 Unspecified atrial fibrillation: Secondary | ICD-10-CM | POA: Diagnosis not present

## 2015-11-01 DIAGNOSIS — M545 Low back pain: Secondary | ICD-10-CM | POA: Diagnosis not present

## 2015-11-01 DIAGNOSIS — F0390 Unspecified dementia without behavioral disturbance: Secondary | ICD-10-CM | POA: Diagnosis not present

## 2015-11-01 DIAGNOSIS — M1991 Primary osteoarthritis, unspecified site: Secondary | ICD-10-CM | POA: Diagnosis not present

## 2015-11-01 DIAGNOSIS — M810 Age-related osteoporosis without current pathological fracture: Secondary | ICD-10-CM | POA: Diagnosis not present

## 2015-11-07 DIAGNOSIS — M1991 Primary osteoarthritis, unspecified site: Secondary | ICD-10-CM | POA: Diagnosis not present

## 2015-11-07 DIAGNOSIS — F0390 Unspecified dementia without behavioral disturbance: Secondary | ICD-10-CM | POA: Diagnosis not present

## 2015-11-07 DIAGNOSIS — I4891 Unspecified atrial fibrillation: Secondary | ICD-10-CM | POA: Diagnosis not present

## 2015-11-07 DIAGNOSIS — M810 Age-related osteoporosis without current pathological fracture: Secondary | ICD-10-CM | POA: Diagnosis not present

## 2015-11-07 DIAGNOSIS — M545 Low back pain: Secondary | ICD-10-CM | POA: Diagnosis not present

## 2015-11-08 ENCOUNTER — Other Ambulatory Visit: Payer: Self-pay | Admitting: Cardiovascular Disease

## 2015-11-09 DIAGNOSIS — I10 Essential (primary) hypertension: Secondary | ICD-10-CM | POA: Diagnosis not present

## 2015-11-09 DIAGNOSIS — I480 Paroxysmal atrial fibrillation: Secondary | ICD-10-CM | POA: Diagnosis not present

## 2015-11-09 DIAGNOSIS — R2689 Other abnormalities of gait and mobility: Secondary | ICD-10-CM | POA: Diagnosis not present

## 2015-11-09 DIAGNOSIS — M545 Low back pain: Secondary | ICD-10-CM | POA: Diagnosis not present

## 2015-11-09 DIAGNOSIS — N183 Chronic kidney disease, stage 3 (moderate): Secondary | ICD-10-CM | POA: Diagnosis not present

## 2015-11-09 DIAGNOSIS — D638 Anemia in other chronic diseases classified elsewhere: Secondary | ICD-10-CM | POA: Diagnosis not present

## 2015-11-09 NOTE — Telephone Encounter (Signed)
Rx(s) sent to pharmacy electronically.  

## 2015-11-11 ENCOUNTER — Emergency Department (HOSPITAL_COMMUNITY): Payer: Medicare Other

## 2015-11-11 ENCOUNTER — Encounter (HOSPITAL_COMMUNITY): Payer: Self-pay | Admitting: Emergency Medicine

## 2015-11-11 ENCOUNTER — Emergency Department (HOSPITAL_COMMUNITY)
Admission: EM | Admit: 2015-11-11 | Discharge: 2015-11-11 | Disposition: A | Discharge status: Home / Self Care | Payer: Medicare Other | Attending: Emergency Medicine | Admitting: Emergency Medicine

## 2015-11-11 DIAGNOSIS — F329 Major depressive disorder, single episode, unspecified: Secondary | ICD-10-CM | POA: Insufficient documentation

## 2015-11-11 DIAGNOSIS — Y998 Other external cause status: Secondary | ICD-10-CM | POA: Diagnosis not present

## 2015-11-11 DIAGNOSIS — F419 Anxiety disorder, unspecified: Secondary | ICD-10-CM | POA: Insufficient documentation

## 2015-11-11 DIAGNOSIS — Y9301 Activity, walking, marching and hiking: Secondary | ICD-10-CM | POA: Diagnosis not present

## 2015-11-11 DIAGNOSIS — S0990XA Unspecified injury of head, initial encounter: Secondary | ICD-10-CM

## 2015-11-11 DIAGNOSIS — N183 Chronic kidney disease, stage 3 (moderate): Secondary | ICD-10-CM | POA: Insufficient documentation

## 2015-11-11 DIAGNOSIS — J45909 Unspecified asthma, uncomplicated: Secondary | ICD-10-CM | POA: Insufficient documentation

## 2015-11-11 DIAGNOSIS — I129 Hypertensive chronic kidney disease with stage 1 through stage 4 chronic kidney disease, or unspecified chronic kidney disease: Secondary | ICD-10-CM | POA: Insufficient documentation

## 2015-11-11 DIAGNOSIS — Z8719 Personal history of other diseases of the digestive system: Secondary | ICD-10-CM | POA: Diagnosis not present

## 2015-11-11 DIAGNOSIS — Z8739 Personal history of other diseases of the musculoskeletal system and connective tissue: Secondary | ICD-10-CM | POA: Insufficient documentation

## 2015-11-11 DIAGNOSIS — E039 Hypothyroidism, unspecified: Secondary | ICD-10-CM | POA: Diagnosis not present

## 2015-11-11 DIAGNOSIS — F0390 Unspecified dementia without behavioral disturbance: Secondary | ICD-10-CM | POA: Insufficient documentation

## 2015-11-11 DIAGNOSIS — S0081XA Abrasion of other part of head, initial encounter: Secondary | ICD-10-CM | POA: Diagnosis not present

## 2015-11-11 DIAGNOSIS — G4730 Sleep apnea, unspecified: Secondary | ICD-10-CM | POA: Insufficient documentation

## 2015-11-11 DIAGNOSIS — Y9289 Other specified places as the place of occurrence of the external cause: Secondary | ICD-10-CM | POA: Insufficient documentation

## 2015-11-11 DIAGNOSIS — W01198A Fall on same level from slipping, tripping and stumbling with subsequent striking against other object, initial encounter: Secondary | ICD-10-CM | POA: Diagnosis not present

## 2015-11-11 DIAGNOSIS — Z79899 Other long term (current) drug therapy: Secondary | ICD-10-CM | POA: Insufficient documentation

## 2015-11-11 DIAGNOSIS — S098XXA Other specified injuries of head, initial encounter: Secondary | ICD-10-CM | POA: Diagnosis not present

## 2015-11-11 DIAGNOSIS — S199XXA Unspecified injury of neck, initial encounter: Secondary | ICD-10-CM | POA: Insufficient documentation

## 2015-11-11 DIAGNOSIS — T07XXXA Unspecified multiple injuries, initial encounter: Secondary | ICD-10-CM

## 2015-11-11 DIAGNOSIS — S0993XA Unspecified injury of face, initial encounter: Secondary | ICD-10-CM | POA: Diagnosis not present

## 2015-11-11 DIAGNOSIS — S0031XA Abrasion of nose, initial encounter: Secondary | ICD-10-CM | POA: Insufficient documentation

## 2015-11-11 DIAGNOSIS — W19XXXA Unspecified fall, initial encounter: Secondary | ICD-10-CM

## 2015-11-11 MED ORDER — FLUORESCEIN SODIUM 1 MG OP STRP
1.0000 | ORAL_STRIP | Freq: Once | OPHTHALMIC | Status: AC
  Administered 2015-11-11: 1 via OPHTHALMIC
  Filled 2015-11-11: qty 1

## 2015-11-11 MED ORDER — ONDANSETRON 8 MG PO TBDP
8.0000 mg | ORAL_TABLET | Freq: Once | ORAL | Status: AC
  Administered 2015-11-11: 8 mg via ORAL
  Filled 2015-11-11: qty 1

## 2015-11-11 MED ORDER — OXYCODONE-ACETAMINOPHEN 5-325 MG PO TABS
1.0000 | ORAL_TABLET | Freq: Once | ORAL | Status: AC
  Administered 2015-11-11: 1 via ORAL
  Filled 2015-11-11: qty 1

## 2015-11-11 MED ORDER — TETRACAINE HCL 0.5 % OP SOLN
1.0000 [drp] | Freq: Once | OPHTHALMIC | Status: AC
  Administered 2015-11-11: 1 [drp] via OPHTHALMIC
  Filled 2015-11-11: qty 4

## 2015-11-11 MED ORDER — BACITRACIN ZINC 500 UNIT/GM EX OINT: 1.0000 "application " | TOPICAL_OINTMENT | Freq: Two times a day (BID) | CUTANEOUS | Status: DC

## 2015-11-11 NOTE — ED Provider Notes (Signed)
CSN: [id number]     Arrival date & time 11/11/15  1546 History   First MD Initiated Contact with Patient 11/11/15 1605     No chief complaint on file.    (Consider location/radiation/quality/duration/timing/severity/associated sxs/prior Treatment) HPI Comments: Patient presents emergency department with chief complaint of mechanical fall. She states that she tripped while she was outside with the dog today. States that she fell to the ground and struck her head and face. She reports brief loss of consciousness. She denies any vision changes, slurred speech, numbness, weakness, or tingling of her extremities. She complains of a mild headache and neck pain. Her symptoms are aggravated with palpation. She has not taken anything for her symptoms. She denies any other associated symptoms.  The history is provided by the patient. No language interpreter was used.    Past Medical History  Diagnosis Date  . Hypertension   . Abnormal heart rhythm   . Osteoporosis   . Scoliosis   . Afib (HCC)     h/o  . Reactive airway disease   . Depression   . Echocardiogram abnormal 02/26/11    MVP,mild MR,AOV mildly sclerotic. EF >55%  . Dementia   . Chronic kidney disease (CKD), stage III (moderate)     [name] 01/05/2015  . Hypothyroidism   . Sleep apnea     "suppose to wear a mask; can't tolerate it" (01/05/2015)  . GERD (gastroesophageal reflux disease)   . Arthritis     "hips, shoulders" (01/05/2015)  . Anxiety    Past Surgical History  Procedure Laterality Date  . Tubal ligation    . Cesarean section  1967  . Cesarean section  1971  . Open reduction internal fixation (orif) distal radial fracture Left 12/2009    [name] 12/28/2009  . Total knee arthroplasty Left 03/2010    [name] 04/07/2010  . Joint replacement    . Vaginal hysterectomy  1980  . Dilation and curettage of uterus    . Cataract extraction, bilateral Bilateral    Family History  Problem Relation Age of Onset  . Heart disease  Father   . Cancer Father   . Cancer Mother    Social History  Substance Use Topics  . Smoking status: Never Smoker   . Smokeless tobacco: Never Used  . Alcohol Use: No   OB History    No data available     Review of Systems  Constitutional: Negative for fever and chills.  Respiratory: Negative for shortness of breath.   Cardiovascular: Negative for chest pain.  Gastrointestinal: Negative for nausea, vomiting, diarrhea and constipation.  Genitourinary: Negative for dysuria.  Skin: Positive for wound.  Neurological: Positive for headaches.  All other systems reviewed and are negative.     Allergies  Vicodin  Home Medications   Prior to Admission medications   Medication Sig Start Date End Date Taking? Authorizing Provider  baclofen (LIORESAL) 10 MG tablet Take 5 mg by mouth 2 (two) times daily.    Yes Historical Provider, MD  calcitRIOL (ROCALTROL) 0.25 MCG capsule Take 0.25 mcg by mouth. Mondays,Wednesdays, and Fridays   Yes Historical Provider, MD  clonazePAM (KLONOPIN) 2 MG tablet Take 2 mg by mouth at bedtime.   Yes Historical Provider, MD  diltiazem (CARDIZEM) 30 MG tablet TAKE 1 TABLET (30 MG TOTAL) BY MOUTH EVERY 8 (EIGHT) HOURS AS NEEDED (FOR SUSTAINED PALPITATIONS). 11/21/14  Yes Mihai Croitoru, MD  diltiazem (DILACOR XR) 240 MG 24 hr capsule Take 240 mg by mouth daily.  Yes Historical Provider, MD  levothyroxine (SYNTHROID, LEVOTHROID) 112 MCG tablet Take 112 mcg by mouth daily before breakfast.   Yes Historical Provider, MD  metoprolol tartrate (LOPRESSOR) 25 MG tablet Take 25 mg by mouth daily.   Yes Historical Provider, MD  omega-3 acid ethyl esters (LOVAZA) 1 G capsule Take 1 g by mouth daily.     Yes Historical Provider, MD  ondansetron (ZOFRAN ODT) 4 MG disintegrating tablet Take 1 tablet (4 mg total) by mouth every 8 (eight) hours as needed for nausea.  ODT q4 hours prn nausea/vomit 06/28/14  Yes [name], MD  potassium chloride SA (K-DUR,KLOR-CON) 20  MEQ tablet Take 20 mEq by mouth daily.     Yes Historical Provider, MD  pregabalin (LYRICA) 100 MG capsule Take 100 mg by mouth at bedtime.   Yes Historical Provider, MD  rOPINIRole (REQUIP) 1 MG tablet Take 1 mg by mouth at bedtime.   Yes Historical Provider, MD  sertraline (ZOLOFT) 100 MG tablet Take 150 mg by mouth daily.   Yes Historical Provider, MD  traMADol (ULTRAM) 50 MG tablet Take 50 mg by mouth 2 (two) times daily.   Yes Historical Provider, MD  Vitamin D, Ergocalciferol, (DRISDOL) 50000 UNITS CAPS capsule Take 50,000 Units by mouth every 7 (seven) days. Mondays   Yes Historical Provider, MD  diltiazem (CARDIZEM CD) 240 MG 24 hr capsule Take 1 capsule (240 mg total) by mouth daily. CONTACT OFFICE NO FUTURE REFILLS Patient not taking: Reported on 11/11/2015 11/09/15   Thurmon Fair, MD  oxyCODONE-acetaminophen (PERCOCET/ROXICET) 5-325 MG tablet Take 1 tablet by mouth every 4 (four) hours as needed for severe pain. Patient not taking: Reported on 11/11/2015 10/01/15   [name], MD   There were no vitals taken for this visit. Physical Exam  Constitutional: She is oriented to person, place, and time. She appears well-developed and well-nourished.  HENT:  Head: Normocephalic and atraumatic.  Abrasions to right forehead and nose No facial bone tenderness Nares patent  Eyes: Conjunctivae and EOM are normal. Pupils are equal, round, and reactive to light.  No evidence of entrapment, no foreign body or fluorescein uptake or corneal abrasion  Neck: Normal range of motion. Neck supple.  Cardiovascular: Normal rate and regular rhythm.   Pulmonary/Chest: Effort normal and breath sounds normal. No respiratory distress. She has no wheezes. She has no rales. She exhibits no tenderness.  Abdominal: Soft. Bowel sounds are normal. She exhibits no distension and no mass. There is no tenderness. There is no rebound and no guarding.  Musculoskeletal: Normal range of motion. She exhibits no edema  or tenderness.  Neurological: She is alert and oriented to person, place, and time.  Skin: Skin is warm and dry.  Psychiatric: She has a normal mood and affect. Her behavior is normal. Judgment and thought content normal.  Nursing note and vitals reviewed.   ED Course  Procedures (including critical care time) Results for orders placed or performed in visit on 02/09/15  CBC & Diff and Retic  Result Value Ref Range   WBC 6.1 3.9 - 10.3 10e3/uL   NEUT# 3.8 1.5 - 6.5 10e3/uL   HGB 13.5 11.6 - 15.9 g/dL   HCT 96.2 95.2 - 84.1 %   Platelets 165 145 - 400 10e3/uL   MCV 94.0 79.5 - 101.0 fL   MCH 31.3 25.1 - 34.0 pg   MCHC 33.3 31.5 - 36.0 g/dL   RBC 3.24 4.01 - 0.27 10e6/uL   RDW 12.6 11.2 -  14.5 %   lymph# 1.7 0.9 - 3.3 10e3/uL   MONO# 0.6 0.1 - 0.9 10e3/uL   Eosinophils Absolute 0.1 0.0 - 0.5 10e3/uL   Basophils Absolute 0.0 0.0 - 0.1 10e3/uL   NEUT% 61.5 38.4 - 76.8 %   LYMPH% 27.0 14.0 - 49.7 %   MONO% 10.2 0.0 - 14.0 %   EOS% 1.0 0.0 - 7.0 %   BASO% 0.3 0.0 - 2.0 %   Retic % 1.20 0.70 - 2.10 %   Retic Ct Abs 51.84 33.70 - 90.70 10e3/uL   Immature Retic Fract 4.60 1.60 - 10.00 %   Ct Head Wo Contrast  11/11/2015  CLINICAL DATA:  Fall EXAM: CT HEAD WITHOUT CONTRAST CT MAXILLOFACIAL WITHOUT CONTRAST CT CERVICAL SPINE WITHOUT CONTRAST TECHNIQUE: Multidetector CT imaging of the head, cervical spine, and maxillofacial structures were performed using the standard protocol without intravenous contrast. Multiplanar CT image reconstructions of the cervical spine and maxillofacial structures were also generated. COMPARISON:  10/08/2015 FINDINGS: CT HEAD FINDINGS There is right frontal mild scalp hematoma and subcutaneous stranding see axial image 7. No skull fracture. Paranasal sinuses and mastoid air cells are unremarkable. No intracranial hemorrhage, mass effect or midline shift. Stable cerebral atrophy. Stable periventricular and advanced chronic subcortical white matter disease. No acute  cortical infarction. No mass lesion is noted on this unenhanced scan. There is no intraventricular hemorrhage. Ventricular size is stable from prior exam. CT MAXILLOFACIAL FINDINGS No intraorbital hematoma. Mild right supraorbital soft tissue swelling. No zygomatic fracture is noted. No paranasal sinuses air-fluid levels. Coronal images shows no orbital rim or orbital floor fracture. No TMJ dislocation. No mandibular fracture is noted. The nasal turbinates are unremarkable. Bilateral semilunar canal is patent. Sagittal images shows patent nasopharyngeal and oropharyngeal airway. There is no maxillary spine fracture. No facial fluid collection. No nasal bone fracture is noted. Bilateral lamina papyracea appears intact. CT CERVICAL SPINE FINDINGS Comparison exam CT scan 11/12/2014 Computer processed images shows no acute fracture or subluxation. Computer processed images shows degenerative changes C1-C2 articulation. There is disc space flattening at C2-C3 level. Disc space flattening with mild anterior spurring at C3-C4 level. There is disc space flattening with mild anterior and mild posterior spurring at C4-C5-C5-C6 and C6-C7 level. No prevertebral soft tissue swelling. Cervical airway is patent. There is no pneumothorax in visualized lung apices. IMPRESSION: 1. No acute intracranial abnormality. Mild scalp swelling and small hematoma in right frontal region. Stable atrophy and extensive chronic white matter disease. No definite acute cortical infarction. 2. There is mild soft tissue swelling right supraorbital. No intraorbital hematoma. 3. No mandibular fracture. No TMJ dislocation. Patent nasopharyngeal and oropharyngeal airway. 4. No facial fluid collections. No paranasal sinuses air-fluid levels. 5. No cervical spine acute fracture or subluxation. Multilevel mild degenerative changes as described above. Electronically Signed   By: Natasha MeadLiviu  Pop M.D.   On: 11/11/2015 18:15   Ct Cervical Spine Wo  Contrast  11/11/2015  CLINICAL DATA:  Fall EXAM: CT HEAD WITHOUT CONTRAST CT MAXILLOFACIAL WITHOUT CONTRAST CT CERVICAL SPINE WITHOUT CONTRAST TECHNIQUE: Multidetector CT imaging of the head, cervical spine, and maxillofacial structures were performed using the standard protocol without intravenous contrast. Multiplanar CT image reconstructions of the cervical spine and maxillofacial structures were also generated. COMPARISON:  10/08/2015 FINDINGS: CT HEAD FINDINGS There is right frontal mild scalp hematoma and subcutaneous stranding see axial image 7. No skull fracture. Paranasal sinuses and mastoid air cells are unremarkable. No intracranial hemorrhage, mass effect or midline shift. Stable cerebral  atrophy. Stable periventricular and advanced chronic subcortical white matter disease. No acute cortical infarction. No mass lesion is noted on this unenhanced scan. There is no intraventricular hemorrhage. Ventricular size is stable from prior exam. CT MAXILLOFACIAL FINDINGS No intraorbital hematoma. Mild right supraorbital soft tissue swelling. No zygomatic fracture is noted. No paranasal sinuses air-fluid levels. Coronal images shows no orbital rim or orbital floor fracture. No TMJ dislocation. No mandibular fracture is noted. The nasal turbinates are unremarkable. Bilateral semilunar canal is patent. Sagittal images shows patent nasopharyngeal and oropharyngeal airway. There is no maxillary spine fracture. No facial fluid collection. No nasal bone fracture is noted. Bilateral lamina papyracea appears intact. CT CERVICAL SPINE FINDINGS Comparison exam CT scan 11/12/2014 Computer processed images shows no acute fracture or subluxation. Computer processed images shows degenerative changes C1-C2 articulation. There is disc space flattening at C2-C3 level. Disc space flattening with mild anterior spurring at C3-C4 level. There is disc space flattening with mild anterior and mild posterior spurring at C4-C5-C5-C6 and  C6-C7 level. No prevertebral soft tissue swelling. Cervical airway is patent. There is no pneumothorax in visualized lung apices. IMPRESSION: 1. No acute intracranial abnormality. Mild scalp swelling and small hematoma in right frontal region. Stable atrophy and extensive chronic white matter disease. No definite acute cortical infarction. 2. There is mild soft tissue swelling right supraorbital. No intraorbital hematoma. 3. No mandibular fracture. No TMJ dislocation. Patent nasopharyngeal and oropharyngeal airway. 4. No facial fluid collections. No paranasal sinuses air-fluid levels. 5. No cervical spine acute fracture or subluxation. Multilevel mild degenerative changes as described above. Electronically Signed   By: Natasha Mead M.D.   On: 11/11/2015 18:15   Ct Maxillofacial Wo Cm  11/11/2015  CLINICAL DATA:  Fall EXAM: CT HEAD WITHOUT CONTRAST CT MAXILLOFACIAL WITHOUT CONTRAST CT CERVICAL SPINE WITHOUT CONTRAST TECHNIQUE: Multidetector CT imaging of the head, cervical spine, and maxillofacial structures were performed using the standard protocol without intravenous contrast. Multiplanar CT image reconstructions of the cervical spine and maxillofacial structures were also generated. COMPARISON:  10/08/2015 FINDINGS: CT HEAD FINDINGS There is right frontal mild scalp hematoma and subcutaneous stranding see axial image 7. No skull fracture. Paranasal sinuses and mastoid air cells are unremarkable. No intracranial hemorrhage, mass effect or midline shift. Stable cerebral atrophy. Stable periventricular and advanced chronic subcortical white matter disease. No acute cortical infarction. No mass lesion is noted on this unenhanced scan. There is no intraventricular hemorrhage. Ventricular size is stable from prior exam. CT MAXILLOFACIAL FINDINGS No intraorbital hematoma. Mild right supraorbital soft tissue swelling. No zygomatic fracture is noted. No paranasal sinuses air-fluid levels. Coronal images shows no orbital  rim or orbital floor fracture. No TMJ dislocation. No mandibular fracture is noted. The nasal turbinates are unremarkable. Bilateral semilunar canal is patent. Sagittal images shows patent nasopharyngeal and oropharyngeal airway. There is no maxillary spine fracture. No facial fluid collection. No nasal bone fracture is noted. Bilateral lamina papyracea appears intact. CT CERVICAL SPINE FINDINGS Comparison exam CT scan 11/12/2014 Computer processed images shows no acute fracture or subluxation. Computer processed images shows degenerative changes C1-C2 articulation. There is disc space flattening at C2-C3 level. Disc space flattening with mild anterior spurring at C3-C4 level. There is disc space flattening with mild anterior and mild posterior spurring at C4-C5-C5-C6 and C6-C7 level. No prevertebral soft tissue swelling. Cervical airway is patent. There is no pneumothorax in visualized lung apices. IMPRESSION: 1. No acute intracranial abnormality. Mild scalp swelling and small hematoma in right frontal region. Stable atrophy  and extensive chronic white matter disease. No definite acute cortical infarction. 2. There is mild soft tissue swelling right supraorbital. No intraorbital hematoma. 3. No mandibular fracture. No TMJ dislocation. Patent nasopharyngeal and oropharyngeal airway. 4. No facial fluid collections. No paranasal sinuses air-fluid levels. 5. No cervical spine acute fracture or subluxation. Multilevel mild degenerative changes as described above. Electronically Signed   By: Natasha Mead M.D.   On: 11/11/2015 18:15    I have personally reviewed and evaluated these images and lab results as part of my medical decision-making.    MDM   Final diagnoses:  Fall, initial encounter  Head injury, initial encounter  Abrasions of multiple sites    Patient with mechanical fall today. Will check CT head, face, and neck. Anticipate discharge soon imaging is negative.  Imaging is negative for fractures.   Patient seen by and discussed with Dr. Criss Alvine, who agrees with the plan for discharge.   Roxy Horseman, PA-C 11/12/15 0021  Pricilla Loveless, MD 11/18/15 [phone]

## 2015-11-11 NOTE — ED Notes (Signed)
Awake. Verbally responsive. A/O x4. Resp even and unlabored. No audible adventitious breath sounds noted. ABC's intact. Family at bedside. 

## 2015-11-11 NOTE — Discharge Instructions (Signed)
Head Injury, Adult °You have received a head injury. It does not appear serious at this time. Headaches and vomiting are common following head injury. It should be easy to awaken from sleeping. Sometimes it is necessary for you to stay in the emergency department for a while for observation. Sometimes admission to the hospital may be needed. After injuries such as yours, most problems occur within the first 24 hours, but side effects may occur up to 7-10 days after the injury. It is important for you to carefully monitor your condition and contact your health care provider or seek immediate medical care if there is a change in your condition. °WHAT ARE THE TYPES OF HEAD INJURIES? °Head injuries can be as minor as a bump. Some head injuries can be more severe. More severe head injuries include: °· A jarring injury to the brain (concussion). °· A bruise of the brain (contusion). This mean there is bleeding in the brain that can cause swelling. °· A cracked skull (skull fracture). °· Bleeding in the brain that collects, clots, and forms a bump (hematoma). °WHAT CAUSES A HEAD INJURY? °A serious head injury is most likely to happen to someone who is in a car wreck and is not wearing a seat belt. Other causes of major head injuries include bicycle or motorcycle accidents, sports injuries, and falls. °HOW ARE HEAD INJURIES DIAGNOSED? °A complete history of the event leading to the injury and your current symptoms will be helpful in diagnosing head injuries. Many times, pictures of the brain, such as CT or MRI are needed to see the extent of the injury. Often, an overnight hospital stay is necessary for observation.  °WHEN SHOULD I SEEK IMMEDIATE MEDICAL CARE?  °You should get help right away if: °· You have confusion or drowsiness. °· You feel sick to your stomach (nauseous) or have continued, forceful vomiting. °· You have dizziness or unsteadiness that is getting worse. °· You have severe, continued headaches not  relieved by medicine. Only take over-the-counter or prescription medicines for pain, fever, or discomfort as directed by your health care provider. °· You do not have normal function of the arms or legs or are unable to walk. °· You notice changes in the black spots in the center of the colored part of your eye (pupil). °· You have a clear or bloody fluid coming from your nose or ears. °· You have a loss of vision. °During the next 24 hours after the injury, you must stay with someone who can watch you for the warning signs. This person should contact local emergency services (911 in the [place] if you have seizures, you become unconscious, or you are unable to wake up. °HOW CAN I PREVENT A HEAD INJURY IN THE FUTURE? °The most important factor for preventing major head injuries is avoiding motor vehicle accidents.  To minimize the potential for damage to your head, it is crucial to wear seat belts while riding in motor vehicles. Wearing helmets while bike riding and playing collision sports (like football) is also helpful. Also, avoiding dangerous activities around the house will further help reduce your risk of head injury.  °WHEN CAN I RETURN TO NORMAL ACTIVITIES AND ATHLETICS? °You should be reevaluated by your health care provider before returning to these activities. If you have any of the following symptoms, you should not return to activities or contact sports until 1 week after the symptoms have stopped: °· Persistent headache. °· Dizziness or vertigo. °· Poor attention and concentration. °· Confusion. °·   Memory problems. °· Nausea or vomiting. °· Fatigue or tire easily. °· Irritability. °· Intolerant of bright lights or loud noises. °· Anxiety or depression. °· Disturbed sleep. °MAKE SURE YOU:  °· Understand these instructions. °· Will watch your condition. °· Will get help right away if you are not doing well or get worse. °  °This information is not intended to replace advice given to you by your health  care provider. Make sure you discuss any questions you have with your health care provider. °  °Document Released: 11/04/2005 Document Revised: 11/25/2014 Document Reviewed: 07/12/2013 °Elsevier Interactive Patient Education ©2016 [employer] ° ° °Abrasion °An abrasion is a cut or scrape on the outer surface of your skin. An abrasion does not extend through all of the layers of your skin. It is important to care for your abrasion properly to prevent infection. °CAUSES °Most abrasions are caused by falling on or gliding across the ground or another surface. When your skin rubs on something, the outer and inner layer of skin rubs off.  °SYMPTOMS °A cut or scrape is the main symptom of this condition. The scrape may be bleeding, or it may appear red or pink. If there was an associated fall, there may be an underlying bruise. °DIAGNOSIS °An abrasion is diagnosed with a physical exam. °TREATMENT °Treatment for this condition depends on how large and deep the abrasion is. Usually, your abrasion will be cleaned with water and mild soap. This removes any dirt or debris that may be stuck. An antibiotic ointment may be applied to the abrasion to help prevent infection. A bandage (dressing) may be placed on the abrasion to keep it clean. °You may also need a tetanus shot. °HOME CARE INSTRUCTIONS °Medicines °· Take or apply medicines only as directed by your health care provider. °· If you were prescribed an antibiotic ointment, finish all of it even if you start to feel better. °Wound Care °· Clean the wound with mild soap and water 2-3 times per day or as directed by your health care provider. Pat your wound dry with a clean towel. Do not rub it. °· There are many different ways to close and cover a wound. Follow instructions from your health care provider about: °¨ Wound care. °¨ Dressing changes and removal. °· Check your wound every day for signs of infection. Watch for: °¨ Redness, swelling, or pain. °¨ Fluid, blood, or  pus. °General Instructions °· Keep the dressing dry as directed by your health care provider. Do not take baths, swim, use a hot tub, or do anything that would put your wound underwater until your health care provider approves. °· If there is swelling, raise (elevate) the injured area above the level of your heart while you are sitting or lying down. °· Keep all follow-up visits as directed by your health care provider. This is important. °SEEK MEDICAL CARE IF: °· You received a tetanus shot and you have swelling, severe pain, redness, or bleeding at the injection site. °· Your pain is not controlled with medicine. °· You have increased redness, swelling, or pain at the site of your wound. °SEEK IMMEDIATE MEDICAL CARE IF: °· You have a red streak going away from your wound. °· You have a fever. °· You have fluid, blood, or pus coming from your wound. °· You notice a bad smell coming from your wound or your dressing. °  °This information is not intended to replace advice given to you by your health care provider. Make   sure you discuss any questions you have with your health care provider. °  °Document Released: 08/14/2005 Document Revised: 07/26/2015 Document Reviewed: 11/02/2014 °Elsevier Interactive Patient Education ©2016 [employer] ° °

## 2015-11-11 NOTE — ED Notes (Addendum)
Cleanse facial abrasion to saline and applied bacitracin dsg per order. Pt tolerated well.

## 2015-11-11 NOTE — ED Notes (Signed)
Bed: [facility] Expected date:  Expected time:  Means of arrival:  Comments: 

## 2015-11-11 NOTE — ED Notes (Signed)
Pt arrived via EMS with report of tripping and falling while walking dog [facility]. Pt noted with facial abrasion, hematoma to forehead, and lac to bridge of nose. Pt denies dizziness/lightheadedness, headache, nausea, and visual disturbances.

## 2015-11-11 NOTE — ED Notes (Signed)
Awake. Verbally responsive. A/O x4. Resp even and unlabored. No audible adventitious breath sounds noted. ABC's intact.  

## 2015-11-17 DIAGNOSIS — M545 Low back pain: Secondary | ICD-10-CM | POA: Diagnosis not present

## 2015-11-17 DIAGNOSIS — F0390 Unspecified dementia without behavioral disturbance: Secondary | ICD-10-CM | POA: Diagnosis not present

## 2015-11-17 DIAGNOSIS — M810 Age-related osteoporosis without current pathological fracture: Secondary | ICD-10-CM | POA: Diagnosis not present

## 2015-11-17 DIAGNOSIS — I4891 Unspecified atrial fibrillation: Secondary | ICD-10-CM | POA: Diagnosis not present

## 2015-11-17 DIAGNOSIS — M1991 Primary osteoarthritis, unspecified site: Secondary | ICD-10-CM | POA: Diagnosis not present

## 2015-11-21 DIAGNOSIS — R2689 Other abnormalities of gait and mobility: Secondary | ICD-10-CM | POA: Diagnosis not present

## 2015-11-21 DIAGNOSIS — M545 Low back pain: Secondary | ICD-10-CM | POA: Diagnosis not present

## 2015-11-21 DIAGNOSIS — I10 Essential (primary) hypertension: Secondary | ICD-10-CM | POA: Diagnosis not present

## 2015-11-22 DIAGNOSIS — D509 Iron deficiency anemia, unspecified: Secondary | ICD-10-CM | POA: Diagnosis not present

## 2015-11-22 DIAGNOSIS — E039 Hypothyroidism, unspecified: Secondary | ICD-10-CM | POA: Diagnosis not present

## 2015-11-22 DIAGNOSIS — N2581 Secondary hyperparathyroidism of renal origin: Secondary | ICD-10-CM | POA: Diagnosis not present

## 2015-11-22 DIAGNOSIS — I129 Hypertensive chronic kidney disease with stage 1 through stage 4 chronic kidney disease, or unspecified chronic kidney disease: Secondary | ICD-10-CM | POA: Diagnosis not present

## 2015-11-22 DIAGNOSIS — M545 Low back pain: Secondary | ICD-10-CM | POA: Diagnosis not present

## 2015-11-22 DIAGNOSIS — M810 Age-related osteoporosis without current pathological fracture: Secondary | ICD-10-CM | POA: Diagnosis not present

## 2015-11-22 DIAGNOSIS — M1991 Primary osteoarthritis, unspecified site: Secondary | ICD-10-CM | POA: Diagnosis not present

## 2015-11-22 DIAGNOSIS — I4891 Unspecified atrial fibrillation: Secondary | ICD-10-CM | POA: Diagnosis not present

## 2015-11-22 DIAGNOSIS — N183 Chronic kidney disease, stage 3 (moderate): Secondary | ICD-10-CM | POA: Diagnosis not present

## 2015-11-30 ENCOUNTER — Other Ambulatory Visit (HOSPITAL_COMMUNITY): Payer: Self-pay

## 2015-11-30 DIAGNOSIS — N19 Unspecified kidney failure: Secondary | ICD-10-CM | POA: Diagnosis not present

## 2015-11-30 DIAGNOSIS — R2689 Other abnormalities of gait and mobility: Secondary | ICD-10-CM | POA: Diagnosis not present

## 2015-11-30 DIAGNOSIS — R269 Unspecified abnormalities of gait and mobility: Secondary | ICD-10-CM | POA: Diagnosis not present

## 2015-11-30 DIAGNOSIS — E860 Dehydration: Secondary | ICD-10-CM | POA: Diagnosis not present

## 2015-12-01 ENCOUNTER — Encounter (HOSPITAL_COMMUNITY)
Admission: RE | Admit: 2015-12-01 | Discharge: 2015-12-01 | Disposition: A | Discharge status: Home / Self Care | Payer: Medicare Other | Source: Ambulatory Visit | Attending: Nephrology | Admitting: Nephrology

## 2015-12-01 DIAGNOSIS — D509 Iron deficiency anemia, unspecified: Secondary | ICD-10-CM | POA: Diagnosis not present

## 2015-12-01 MED ORDER — SODIUM CHLORIDE 0.9 % IV SOLN
510.0000 mg | INTRAVENOUS | Status: DC
  Administered 2015-12-01: 510 mg via INTRAVENOUS
  Filled 2015-12-01: qty 17

## 2015-12-01 NOTE — Discharge Instructions (Signed)

## 2015-12-04 ENCOUNTER — Other Ambulatory Visit: Payer: Self-pay | Admitting: Cardiovascular Disease

## 2015-12-04 ENCOUNTER — Telehealth: Payer: Self-pay | Admitting: Cardiovascular Disease

## 2015-12-04 NOTE — Telephone Encounter (Signed)
Received records from [facility] for appointment on 12/28/15 with Dr Royann Shiversroitoru.  [facility] for Dr Croitoru's schedule on 12/28/15. lp

## 2015-12-04 NOTE — Telephone Encounter (Signed)
Rx request sent to pharmacy.  

## 2015-12-05 ENCOUNTER — Telehealth: Payer: Self-pay | Admitting: Cardiovascular Disease

## 2015-12-05 ENCOUNTER — Other Ambulatory Visit: Payer: Self-pay | Admitting: Internal Medicine

## 2015-12-05 NOTE — Telephone Encounter (Signed)
Received records from [facility] for appointment on 01/10/16 with Dr Royann Shivers.  Records given to [facility] (medical records) for Dr Croitoru's schedule on 01/10/16. lp

## 2015-12-06 ENCOUNTER — Telehealth: Payer: Self-pay | Admitting: Internal Medicine

## 2015-12-06 DIAGNOSIS — M810 Age-related osteoporosis without current pathological fracture: Secondary | ICD-10-CM | POA: Diagnosis not present

## 2015-12-06 DIAGNOSIS — M1991 Primary osteoarthritis, unspecified site: Secondary | ICD-10-CM | POA: Diagnosis not present

## 2015-12-06 DIAGNOSIS — M545 Low back pain: Secondary | ICD-10-CM | POA: Diagnosis not present

## 2015-12-06 DIAGNOSIS — I4891 Unspecified atrial fibrillation: Secondary | ICD-10-CM | POA: Diagnosis not present

## 2015-12-06 NOTE — Telephone Encounter (Signed)
Pt is requesting refill on Clonazepam. Last OV 10/06/15.[name] Pending OV 10/07/16. Last refill was on 10/05/15.  CY - please advise on refill. Thanks.

## 2015-12-07 MED ORDER — CLONAZEPAM 2 MG PO TABS: 2.0000 mg | ORAL_TABLET | Freq: Every day | ORAL | Status: DC

## 2015-12-07 NOTE — Telephone Encounter (Signed)
Ok to refill 6 months 

## 2015-12-07 NOTE — Telephone Encounter (Signed)
Spoke with pt. Advised her that we will call in her prescription. Rx has been called in. Nothing further was needed.

## 2015-12-08 ENCOUNTER — Encounter (HOSPITAL_COMMUNITY)
Admission: RE | Admit: 2015-12-08 | Discharge: 2015-12-08 | Disposition: A | Discharge status: Home / Self Care | Payer: Medicare Other | Source: Ambulatory Visit | Attending: Nephrology | Admitting: Nephrology

## 2015-12-08 DIAGNOSIS — D509 Iron deficiency anemia, unspecified: Secondary | ICD-10-CM | POA: Diagnosis not present

## 2015-12-08 MED ORDER — SODIUM CHLORIDE 0.9 % IV SOLN
510.0000 mg | INTRAVENOUS | Status: AC
  Administered 2015-12-08: 510 mg via INTRAVENOUS
  Filled 2015-12-08: qty 17

## 2015-12-11 DIAGNOSIS — R2689 Other abnormalities of gait and mobility: Secondary | ICD-10-CM | POA: Diagnosis not present

## 2015-12-11 DIAGNOSIS — H903 Sensorineural hearing loss, bilateral: Secondary | ICD-10-CM | POA: Diagnosis not present

## 2015-12-11 DIAGNOSIS — I10 Essential (primary) hypertension: Secondary | ICD-10-CM | POA: Diagnosis not present

## 2015-12-11 DIAGNOSIS — H9113 Presbycusis, bilateral: Secondary | ICD-10-CM | POA: Diagnosis not present

## 2015-12-11 DIAGNOSIS — N289 Disorder of kidney and ureter, unspecified: Secondary | ICD-10-CM | POA: Diagnosis not present

## 2015-12-12 DIAGNOSIS — M1991 Primary osteoarthritis, unspecified site: Secondary | ICD-10-CM | POA: Diagnosis not present

## 2015-12-12 DIAGNOSIS — I4891 Unspecified atrial fibrillation: Secondary | ICD-10-CM | POA: Diagnosis not present

## 2015-12-12 DIAGNOSIS — M545 Low back pain: Secondary | ICD-10-CM | POA: Diagnosis not present

## 2015-12-12 DIAGNOSIS — M810 Age-related osteoporosis without current pathological fracture: Secondary | ICD-10-CM | POA: Diagnosis not present

## 2015-12-21 ENCOUNTER — Telehealth: Payer: Self-pay | Admitting: Cardiovascular Disease

## 2015-12-21 ENCOUNTER — Encounter: Payer: Self-pay | Admitting: Neurology

## 2015-12-21 ENCOUNTER — Ambulatory Visit (INDEPENDENT_AMBULATORY_CARE_PROVIDER_SITE_OTHER): Payer: Medicare Other | Admitting: Neurology

## 2015-12-21 VITALS — BP 96/54 | HR 68 | Ht 62.0 in | Wt 137.0 lb

## 2015-12-21 DIAGNOSIS — G2581 Restless legs syndrome: Secondary | ICD-10-CM | POA: Diagnosis not present

## 2015-12-21 DIAGNOSIS — M545 Low back pain: Secondary | ICD-10-CM | POA: Diagnosis not present

## 2015-12-21 DIAGNOSIS — R269 Unspecified abnormalities of gait and mobility: Secondary | ICD-10-CM | POA: Diagnosis not present

## 2015-12-21 DIAGNOSIS — E538 Deficiency of other specified B group vitamins: Secondary | ICD-10-CM

## 2015-12-21 DIAGNOSIS — M1991 Primary osteoarthritis, unspecified site: Secondary | ICD-10-CM | POA: Diagnosis not present

## 2015-12-21 DIAGNOSIS — I4891 Unspecified atrial fibrillation: Secondary | ICD-10-CM | POA: Diagnosis not present

## 2015-12-21 DIAGNOSIS — M810 Age-related osteoporosis without current pathological fracture: Secondary | ICD-10-CM | POA: Diagnosis not present

## 2015-12-21 HISTORY — DX: Unspecified abnormalities of gait and mobility: R26.9

## 2015-12-21 NOTE — Telephone Encounter (Signed)
Pt seen by Dr. Anne Hahn w [facility] today for issues of gait abnormality and recent CT findings. Some concern about possibly putting pt on anticoagulant. Pt has f/u w/ Dr. Royann Shivers on 2/22. Wondering if she needs to be seen any sooner. Will route for Dr. Erin Hearing review.

## 2015-12-21 NOTE — Progress Notes (Signed)
Reason for visit: Gait disorder  Referring physician: Dr. Naaman Plummer is a 75 y.o. 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 [name] has indicated that her walking has become particularly bad over Ana last 6 months. She indicates that at times she may get dizzy before she falls, but she also may fall without any dizziness. She denies any true syncope or dimming of vision prior to Ana fall, she will tend to topple forward, occasionally she may go to Ana side. She has been using a walker for about one year, she usually falls when she is not using her walker, but can fall with a walker too. [name] has also developed difficulty controlling Ana bladder over Ana last 6 months, and she has developed some memory issues over Ana last 8-10 months. She mainly reports short-term memory issues and word finding problems. She denies any headaches, she does report some problems with weight loss. She now is residing in [name]. She indicates that she just completed physical therapy for Ana walking without much benefit. Ana last fall was around 11/19/2015. She has gone to Ana emergency room on 11/11/2015 with a fall, and CT of Ana head has shown extensive white matter changes. She is sent to this office for an evaluation. She has stage III renal insufficiency, she is on Lyrica at 100 mg at night and she takes 2 mg of clonazepam at night.  Past Medical History  Diagnosis Date  . Hypertension   . Abnormal heart rhythm   . Osteoporosis   . Scoliosis   . Afib (HCC)     h/o  . Reactive airway disease   . Depression   . Echocardiogram abnormal 02/26/11    MVP,mild MR,AOV mildly sclerotic. EF >55%  . Dementia   . Chronic kidney disease (CKD), stage III (moderate)     [name] 01/05/2015  . Hypothyroidism   . Sleep  apnea     "suppose to wear a mask; can't tolerate it" (01/05/2015)  . GERD (gastroesophageal reflux disease)   . Arthritis     "hips, shoulders" (01/05/2015)  . Anxiety   . Abnormality of gait 12/21/2015    Past Surgical History  Procedure Laterality Date  . Tubal ligation    . Cesarean section  1967  . Cesarean section  1971  . Open reduction internal fixation (orif) distal radial fracture Left 12/2009    [name] 12/28/2009  . Total knee arthroplasty Left 03/2010    [name] 04/07/2010  . Joint replacement    . Vaginal hysterectomy  1980  . Dilation and curettage of uterus    . Cataract extraction, bilateral Bilateral     Family History  Problem Relation Age of Onset  . Heart disease Father   . Cancer Father   . Cancer Mother     Social history:  reports that she has never smoked. She has never used smokeless tobacco. She reports that she does not drink alcohol or use illicit drugs.  Medications:  Prior to Admission medications   Medication Sig Start Date End Date Taking? Authorizing Provider  baclofen (LIORESAL) 10 MG tablet Take 5 mg by mouth 2 (two) times daily.     Historical Provider, MD  calcitRIOL (ROCALTROL) 0.25 MCG capsule Take 0.25 mcg by mouth. Mondays,Wednesdays, and Fridays  Historical Provider, MD  clonazePAM (KLONOPIN) 2 MG tablet Take 1 tablet (2 mg total) by mouth at bedtime. 12/07/15   Waymon Budge, MD  diltiazem (CARDIZEM CD) 240 MG 24 hr capsule TAKE 1 CAPSULE BY MOUTH DAILY. CONTACT OFFICE NO FUTURE REFILLS 12/04/15   Mihai Croitoru, MD  diltiazem (CARDIZEM) 30 MG tablet TAKE 1 TABLET (30 MG TOTAL) BY MOUTH EVERY 8 (EIGHT) HOURS AS NEEDED (FOR SUSTAINED PALPITATIONS). 11/21/14   Mihai Croitoru, MD  diltiazem (DILACOR XR) 240 MG 24 hr capsule Take 240 mg by mouth daily.    Historical Provider, MD  levothyroxine (SYNTHROID, LEVOTHROID) 112 MCG tablet Take 112 mcg by mouth daily before breakfast.    Historical Provider, MD  metoprolol tartrate (LOPRESSOR) 25 MG  tablet Take 25 mg by mouth daily.    Historical Provider, MD  omega-3 acid ethyl esters (LOVAZA) 1 G capsule Take 1 g by mouth daily.      Historical Provider, MD  ondansetron (ZOFRAN ODT) 4 MG disintegrating tablet Take 1 tablet (4 mg total) by mouth every 8 (eight) hours as needed for nausea. 4mg  ODT q4 hours prn nausea/vomit 06/28/14   [name], MD  oxyCODONE-acetaminophen (PERCOCET/ROXICET) 5-325 MG tablet Take 1 tablet by mouth every 4 (four) hours as needed for severe pain. Foster not taking: Reported on 11/11/2015 10/01/15   [name], MD  potassium chloride SA (K-DUR,KLOR-CON) 20 MEQ tablet Take 20 mEq by mouth daily.      Historical Provider, MD  pregabalin (LYRICA) 100 MG capsule Take 100 mg by mouth at bedtime.    Historical Provider, MD  rOPINIRole (REQUIP) 1 MG tablet Take 1 mg by mouth at bedtime.    Historical Provider, MD  sertraline (ZOLOFT) 100 MG tablet Take 150 mg by mouth daily.    Historical Provider, MD  traMADol (ULTRAM) 50 MG tablet Take 50 mg by mouth 2 (two) times daily.    Historical Provider, MD  Vitamin D, Ergocalciferol, (DRISDOL) 50000 UNITS CAPS capsule Take 50,000 Units by mouth every 7 (seven) days. Mondays    Historical Provider, MD      Allergies  Allergen Reactions  . Vicodin [Hydrocodone-Acetaminophen] Nausea And Vomiting    ROS:  Out of a complete 14 system review of symptoms, [name] complains only of Ana following symptoms, and all other reviewed systems are negative.  Weight loss, fatigue Palpitations of Ana heart, heart murmur Hearing loss Shortness of breath Feeling cold, aching muscles Urinary incontinence  Blood pressure 96/54, pulse 68, height 5\' 2"  (1.575 m), weight 137 lb (62.143 kg).   Blood pressure, right arm, standing is 112/70. Blood pressure, right arm, sitting is 118/72.  Physical Exam  General: [name] is alert and cooperative at Ana time of Ana examination.  Eyes: Pupils are equal, round, and reactive  to light. Discs are flat bilaterally.  Neck: Ana neck is supple, no carotid bruits are noted.  Respiratory: Ana respiratory examination is clear.  Cardiovascular: Ana cardiovascular examination reveals a regular rate and rhythm, no obvious murmurs or rubs are noted.  Skin: Extremities are without significant edema.  Neurologic Exam  Mental status: [name] is alert and oriented x 3 at Ana time of Ana examination. [name] has apparent normal recent and remote memory, with an apparently normal attention span and concentration ability.  Cranial nerves: Facial symmetry is present. There is good sensation of Ana face to pinprick and soft touch bilaterally. Ana strength of Ana facial muscles and Ana muscles to  head turning and shoulder shrug are normal bilaterally. Speech is well enunciated, no aphasia or dysarthria is noted. Extraocular movements are full. Visual fields are full. Ana tongue is midline, and [name] has symmetric elevation of Ana soft palate. No obvious hearing deficits are noted.  Motor: Ana motor testing reveals 5 over 5 strength of all 4 extremities. Good symmetric motor tone is noted throughout.  Sensory: Sensory testing is intact to pinprick, soft touch, vibration sensation, and position sense on all 4 extremities. No evidence of extinction is noted.  Coordination: Cerebellar testing reveals good finger-nose-finger and heel-to-shin bilaterally.  Gait and station: Gait is minimally wide-based, unsteady, [name] uses a walker for ambulation. Tandem gait was not attempted. At times, [name] has a very definite tendency to lean backwards with standing. Romberg is negative. No drift is seen.  Reflexes: Deep tendon reflexes are symmetric and normal bilaterally. Toes are downgoing bilaterally.   CT head and cervical spine 11/11/15:  IMPRESSION: 1. No acute intracranial abnormality. Mild scalp swelling and small hematoma in right frontal region. Stable atrophy  and extensive chronic white matter disease. No definite acute cortical infarction. 2. There is mild soft tissue swelling right supraorbital. No intraorbital hematoma. 3. No mandibular fracture. No TMJ dislocation. Patent nasopharyngeal and oropharyngeal airway. 4. No facial fluid collections. No paranasal sinuses air-fluid levels. 5. No cervical spine acute fracture or subluxation. Multilevel mild degenerative changes as described above.  * CT scan images were reviewed online. I agree with Ana written report.    Assessment/Plan:  1. Progressive gait disorder  2. Extensive small vessel disease by CT head  [name] has chronic renal insufficiency, she is on clonazepam and Lyrica which may impair balance. I recommended that she be tapered off of both of these medications. [name] indicates that she has just gone through physical therapy without much benefit. Ana CT Ana head suggests a significant degree of small vessel ischemic changes which may impair memory, bladder function, and balance. [name] indicates that she has difficulty with all of these issues. [name] will be sent for blood work today, she will be sent for MRI evaluation of Ana brain. I will see her back in several months. We may consider placing her on antiplatelet agents, she indicates that she cannot take aspirin secondary to her renal function.  Marlan Palau MD 12/21/2015 7:00 PM  [facility] [address] [place], [place] [address]  Phone [phone] Fax [phone]

## 2015-12-21 NOTE — Patient Instructions (Signed)
We will check blood work and get MRI of the brain.  Fall Prevention in the Home  Falls can cause injuries and can affect people from all age groups. There are many simple things that you can do to make your home safe and to help prevent falls. WHAT CAN I DO ON THE OUTSIDE OF MY HOME?  Regularly repair the edges of walkways and driveways and fix any cracks.  Remove high doorway thresholds.  Trim any shrubbery on the main path into your home.  Use bright outdoor lighting.  Clear walkways of debris and clutter, including tools and rocks.  Regularly check that handrails are securely fastened and in good repair. Both sides of any steps should have handrails.  Install guardrails along the edges of any raised decks or porches.  Have leaves, snow, and ice cleared regularly.  Use sand or salt on walkways during winter months.  In the garage, clean up any spills right away, including grease or oil spills. WHAT CAN I DO IN THE BATHROOM?  Use night lights.  Install grab bars by the toilet and in the tub and shower. Do not use towel bars as grab bars.  Use non-skid mats or decals on the floor of the tub or shower.  If you need to sit down while you are in the shower, use a plastic, non-slip stool.[name]  Keep the floor dry. Immediately clean up any water that spills on the floor.  Remove soap buildup in the tub or shower on a regular basis.  Attach bath mats securely with double-sided non-slip rug tape.  Remove throw rugs and other tripping hazards from the floor. WHAT CAN I DO IN THE BEDROOM?  Use night lights.  Make sure that a bedside light is easy to reach.  Do not use oversized bedding that drapes onto the floor.  Have a firm chair that has side arms to use for getting dressed.  Remove throw rugs and other tripping hazards from the floor. WHAT CAN I DO IN THE KITCHEN?   Clean up any spills right away.  Avoid walking on wet floors.  Place frequently used items in  easy-to-reach places.  If you need to reach for something above you, use a sturdy step stool that has a grab bar.  Keep electrical cables out of the way.  Do not use floor polish or wax that makes floors slippery. If you have to use wax, make sure that it is non-skid floor wax.  Remove throw rugs and other tripping hazards from the floor. WHAT CAN I DO IN THE STAIRWAYS?  Do not leave any items on the stairs.  Make sure that there are handrails on both sides of the stairs. Fix handrails that are broken or loose. Make sure that handrails are as long as the stairways.  Check any carpeting to make sure that it is firmly attached to the stairs. Fix any carpet that is loose or worn.  Avoid having throw rugs at the top or bottom of stairways, or secure the rugs with carpet tape to prevent them from moving.  Make sure that you have a light switch at the top of the stairs and the bottom of the stairs. If you do not have them, have them installed. WHAT ARE SOME OTHER FALL PREVENTION TIPS?  Wear closed-toe shoes that fit well and support your feet. Wear shoes that have rubber soles or low heels.  When you use a stepladder, make sure that it is completely opened and  that the sides are firmly locked. Have someone hold the ladder while you are using it. Do not climb a closed stepladder.  Add color or contrast paint or tape to grab bars and handrails in your home. Place contrasting color strips on the first and last steps.  Use mobility aids as needed, such as canes, walkers, scooters, and crutches.  Turn on lights if it is dark. Replace any light bulbs that burn out.  Set up furniture so that there are clear paths. Keep the furniture in the same spot.  Fix any uneven floor surfaces.  Choose a carpet design that does not hide the edge of steps of a stairway.  Be aware of any and all pets.  Review your medicines with your healthcare provider. Some medicines can cause dizziness or changes in  blood pressure, which increase your risk of falling. Talk with your health care provider about other ways that you can decrease your risk of falls. This may include working with a physical therapist or trainer to improve your strength, balance, and endurance.   This information is not intended to replace advice given to you by your health care provider. Make sure you discuss any questions you have with your health care provider.   Document Released: 10/25/2002 Document Revised: 03/21/2015 Document Reviewed: 12/09/2014 Elsevier Interactive Patient Education [employer].

## 2015-12-21 NOTE — Telephone Encounter (Signed)
New message   Pt was told by another dr that she is having mini strokes  Pt states that he didn't put her on any medication and she wants to speak to Dr. Rebecca Eaton

## 2015-12-22 NOTE — Telephone Encounter (Signed)
I reviewed Dr. Anne Hahn' notes. He is considering antiplatelet agents (clopidogrel if she cannot take ASA). I think that is very reasonable, but not an emergency. We can talk about it at her appt.

## 2015-12-22 NOTE — Telephone Encounter (Signed)
Phone rings w/o answer or VM pickup. 

## 2015-12-24 LAB — METHYLMALONIC ACID, SERUM: Methylmalonic Acid: 908 nmol/L — ABNORMAL HIGH (ref 0–378)

## 2015-12-24 LAB — SEDIMENTATION RATE: Sed Rate: 19 mm/hr (ref 0–40)

## 2015-12-24 LAB — VITAMIN B12: Vitamin B-12: 366 pg/mL (ref 211–946)

## 2015-12-24 LAB — COPPER, SERUM: COPPER: 175 ug/dL — AB (ref 72–166)

## 2015-12-24 LAB — RPR: RPR: NONREACTIVE

## 2015-12-25 ENCOUNTER — Telehealth: Payer: Self-pay | Admitting: Neurology

## 2015-12-25 NOTE — Telephone Encounter (Signed)
I called the patient. The MMA level was high, but with a normal vitamin B12 level. The patient is to go on vitamin B12 supplimentation.

## 2015-12-26 DIAGNOSIS — R42 Dizziness and giddiness: Secondary | ICD-10-CM | POA: Diagnosis not present

## 2015-12-26 DIAGNOSIS — M545 Low back pain: Secondary | ICD-10-CM | POA: Diagnosis not present

## 2015-12-26 DIAGNOSIS — I10 Essential (primary) hypertension: Secondary | ICD-10-CM | POA: Diagnosis not present

## 2015-12-26 DIAGNOSIS — I480 Paroxysmal atrial fibrillation: Secondary | ICD-10-CM | POA: Diagnosis not present

## 2015-12-28 ENCOUNTER — Ambulatory Visit: Payer: Self-pay | Admitting: Cardiovascular Disease

## 2016-01-03 DIAGNOSIS — E039 Hypothyroidism, unspecified: Secondary | ICD-10-CM | POA: Diagnosis not present

## 2016-01-03 DIAGNOSIS — D509 Iron deficiency anemia, unspecified: Secondary | ICD-10-CM | POA: Diagnosis not present

## 2016-01-03 DIAGNOSIS — Z79899 Other long term (current) drug therapy: Secondary | ICD-10-CM | POA: Diagnosis not present

## 2016-01-09 ENCOUNTER — Other Ambulatory Visit: Payer: Self-pay

## 2016-01-09 DIAGNOSIS — M545 Low back pain: Secondary | ICD-10-CM | POA: Diagnosis not present

## 2016-01-09 DIAGNOSIS — R42 Dizziness and giddiness: Secondary | ICD-10-CM | POA: Diagnosis not present

## 2016-01-09 DIAGNOSIS — G4730 Sleep apnea, unspecified: Secondary | ICD-10-CM | POA: Diagnosis not present

## 2016-01-09 DIAGNOSIS — R2689 Other abnormalities of gait and mobility: Secondary | ICD-10-CM | POA: Diagnosis not present

## 2016-01-10 ENCOUNTER — Telehealth: Payer: Self-pay | Admitting: Cardiovascular Disease

## 2016-01-10 ENCOUNTER — Ambulatory Visit (INDEPENDENT_AMBULATORY_CARE_PROVIDER_SITE_OTHER): Payer: Medicare Other | Admitting: Cardiovascular Disease

## 2016-01-10 ENCOUNTER — Encounter (INDEPENDENT_AMBULATORY_CARE_PROVIDER_SITE_OTHER): Payer: Medicare Other

## 2016-01-10 VITALS — BP 124/74 | HR 76 | Ht 62.0 in | Wt 135.0 lb

## 2016-01-10 DIAGNOSIS — I10 Essential (primary) hypertension: Secondary | ICD-10-CM | POA: Diagnosis not present

## 2016-01-10 DIAGNOSIS — I471 Supraventricular tachycardia: Secondary | ICD-10-CM | POA: Diagnosis not present

## 2016-01-10 DIAGNOSIS — G450 Vertebro-basilar artery syndrome: Secondary | ICD-10-CM

## 2016-01-10 DIAGNOSIS — R55 Syncope and collapse: Secondary | ICD-10-CM | POA: Diagnosis not present

## 2016-01-10 MED ORDER — DILTIAZEM HCL ER COATED BEADS 180 MG PO CP24: 180.0000 mg | ORAL_CAPSULE | Freq: Every day | ORAL | Status: DC

## 2016-01-10 NOTE — Progress Notes (Signed)
Patient ID: [name], female   DOB: [birth year], 75 y.o.   MRN: [id number]    Cardiology Office Note    Date:  01/12/2016   ID:  Ana, Foster 1941-08-07, MRN [id number]  PCP:  Pcp Not In System  Cardiologist:   Thurmon Fair, MD   Chief Complaint  Patient presents with  . Follow-up  . Dizziness  . Shortness of Breath  . Chest Pain    PRESSURE  . Edema    FEET AND HANDS    History of Present Illness:  [name] is a 75 y.o. female with a history of paroxysmal atrial tachycardia who presents with complaints of frequent falls. These happen unexpectedly. She members falling around Christmas and then again around [place] and she was seen in the emergency department. She has no prodromal symptoms. She denies any dizziness and has not expressed any loss of consciousness. She suddenly finds herself falling forward on her face. She is using a walker. The symptoms have worsened steadily over the last 2 years. She has also had some problems with bladder continence and over the last year some short-term memory issues, difficulty finding words and names. She is now living in assisted living.  She recently saw Dr. Anne Hahn in the neurology clinic and her head CT showed extensive chronic white matter disease. He recommended that she taper off Lyrica and clonazepam. It appears that he also ordered an MR I of the brain, but this has not been performed yet.  She has a history of paroxysmal atrial tachycardia up to about 180 bpm. If these were very prominent when she was receiving excessive levothyroxine supplementation, but has not had palpitations recently. Palpitations have never been prominent around the time of her falls. Cardiac workup showed normal left ventricular systolic function, normal left atrial size, no valvular abnormalities, no evidence of coronary insufficiency on remote nuclear perfusion study.  Past Medical History  Diagnosis Date  . Hypertension   . Abnormal heart  rhythm   . Osteoporosis   . Scoliosis   . Afib (HCC)     h/o  . Reactive airway disease   . Depression   . Echocardiogram abnormal 02/26/11    MVP,mild MR,AOV mildly sclerotic. EF >55%  . Dementia   . Chronic kidney disease (CKD), stage III (moderate)     [name] 01/05/2015  . Hypothyroidism   . Sleep apnea     "suppose to wear a mask; can't tolerate it" (01/05/2015)  . GERD (gastroesophageal reflux disease)   . Arthritis     "hips, shoulders" (01/05/2015)  . Anxiety   . Abnormality of gait 12/21/2015    Past Surgical History  Procedure Laterality Date  . Tubal ligation    . Cesarean section  1967  . Cesarean section  1971  . Open reduction internal fixation (orif) distal radial fracture Left 12/2009    [name] 12/28/2009  . Total knee arthroplasty Left 03/2010    [name] 04/07/2010  . Joint replacement    . Vaginal hysterectomy  1980  . Dilation and curettage of uterus    . Cataract extraction, bilateral Bilateral     Outpatient Prescriptions Prior to Visit  Medication Sig Dispense Refill  . baclofen (LIORESAL) 10 MG tablet Take 5 mg by mouth 2 (two) times daily.     . calcitRIOL (ROCALTROL) 0.25 MCG capsule Take 0.25 mcg by mouth. Mondays,Wednesdays, and Fridays    . clonazePAM (KLONOPIN) 2 MG tablet Take 1 tablet (2 mg total)  by mouth at bedtime. 30 tablet 5  . levothyroxine (SYNTHROID, LEVOTHROID) 112 MCG tablet Take 112 mcg by mouth daily before breakfast.    . metoprolol tartrate (LOPRESSOR) 25 MG tablet Take 12.5 mg by mouth 2 (two) times daily.     [name] omega-3 acid ethyl esters (LOVAZA) 1 G capsule Take 1 g by mouth daily.      . ondansetron (ZOFRAN ODT) 4 MG disintegrating tablet Take 1 tablet (4 mg total) by mouth every 8 (eight) hours as needed for nausea. 4mg  ODT q4 hours prn nausea/vomit 30 tablet 0  . oxyCODONE-acetaminophen (PERCOCET/ROXICET) 5-325 MG tablet Take 1 tablet by mouth every 4 (four) hours as needed for severe pain. 30 tablet 0  . potassium chloride SA  (K-DUR,KLOR-CON) 20 MEQ tablet Take 20 mEq by mouth daily.      . pregabalin (LYRICA) 100 MG capsule Take 100 mg by mouth at bedtime.    [name] rOPINIRole (REQUIP) 1 MG tablet Take 1 mg by mouth at bedtime.    . sertraline (ZOLOFT) 100 MG tablet Take 150 mg by mouth daily.    . traMADol (ULTRAM) 50 MG tablet Take 50 mg by mouth 2 (two) times daily.    . vitamin B-12 (CYANOCOBALAMIN) 1000 MCG tablet Take 1,000 mcg by mouth daily.    . Vitamin D, Ergocalciferol, (DRISDOL) 50000 UNITS CAPS capsule Take 50,000 Units by mouth every 7 (seven) days. Mondays    . diltiazem (CARDIZEM CD) 240 MG 24 hr capsule TAKE 1 CAPSULE BY MOUTH DAILY. CONTACT OFFICE NO FUTURE REFILLS 30 capsule 1  . diltiazem (CARDIZEM) 30 MG tablet TAKE 1 TABLET (30 MG TOTAL) BY MOUTH EVERY 8 (EIGHT) HOURS AS NEEDED (FOR SUSTAINED PALPITATIONS). 90 tablet 1  . diltiazem (DILACOR XR) 240 MG 24 hr capsule Take 240 mg by mouth daily.     No facility-administered medications prior to visit.     Allergies:   Vicodin   Social History   Social History  . Marital Status: Divorced    Spouse Name: N/A  . Number of Children: 2  . Years of Education: 12   Occupational History  . retired    Social History Main Topics  . Smoking status: Never Smoker   . Smokeless tobacco: Never Used  . Alcohol Use: No  . Drug Use: No  . Sexual Activity: No   Other Topics Concern  . Not on file   Social History Narrative   Patient drinks caffeine twice a month.   Patient is right handed.      Family History:  The patient's family history includes Cancer in her father and mother; Heart disease in her father.   ROS:   Please see the history of present illness.    ROS All other systems reviewed and are negative.   PHYSICAL EXAM:   VS:  BP 124/74 mmHg  Pulse 76  Ht 5\' 2"  (1.575 m)  Wt 61.236 kg (135 lb)  BMI 24.69 kg/m2   GEN: Well nourished, well developed, in no acute distress HEENT: normal Neck: no JVD, carotid bruits, or  masses Cardiac: RRR; no murmurs, rubs, or gallops,no edema  Respiratory:  clear to auscultation bilaterally, normal work of breathing GI: soft, nontender, nondistended, + BS MS: no deformity or atrophy Skin: warm and dry, no rash Neuro:  Alert and Oriented x 3, Strength and sensation are intact Psych: euthymic mood, full affect  Wt Readings from Last 3 Encounters:  01/10/16 61.236 kg (135 lb)  12/21/15 62.143  kg (137 lb)  12/08/15 62.171 kg (137 lb 1 oz)      Studies/Labs Reviewed:   EKG:  EKG is ordered today.  The ekg ordered today demonstrates NSR, normal QTc 423 ms  Recent Labs: 02/09/2015: HGB 13.5; Platelets 165   Lipid Panel    Component Value Date/Time   CHOL 196 09/15/2014 1224   TRIG 202* 09/15/2014 1224   HDL 54 09/15/2014 1224   CHOLHDL 3.6 09/15/2014 1224   VLDL 40 09/15/2014 1224   LDLCALC 102* 09/15/2014 1224    Additional studies/ records that were reviewed today include:  From Dr. Anne Hahn and emergency room visits, CT results    ASSESSMENT:    1. Paroxysmal atrial tachycardia (HCC)   2. Essential hypertension   3. VBI (vertebrobasilar insufficiency)   4. Near syncope      PLAN:  In order of problems listed above:  1. PAT: Asymptomatic on diltiazem and metoprolol. Her blood pressure and heart rate are normal. The absence of dizziness preceding her fall suggests that these are not hemodynamically mediated. However it is not unreasonable to try to gradually wean down these medications to see if this helps with her falls. Will decrease the dose of diltiazem. We'll also have her wear an event monitor to make sure that there is no connection between her falls and arrhythmia. 2. HTN: Normal BP current medications 3. I have suggested that we add an MRA to her MRI of the brain, to make sure she does not have vertebrobasilar insufficiency as a cause of possible "drop attacks". 4. I don't think that her falls are related to syncope based on the history she  provides    Medication Adjustments/Labs and Tests Ordered: Current medicines are reviewed at length with the patient today.  Concerns regarding medicines are outlined above.  Medication changes, Labs and Tests ordered today are listed in the Patient Instructions below. Patient Instructions  Your physician has recommended that you wear a 30 day event monitor. Event monitors are medical devices that record the heart's electrical activity. Doctors most often [place] these monitors to diagnose arrhythmias. Arrhythmias are problems with the speed or rhythm of the heartbeat. The monitor is a small, portable device. You can wear one while you do your normal daily activities. This is usually used to diagnose what is causing palpitations/syncope (passing out).  Your physician has recommended you make the following change in your medication: decrease diltiazem to 180 mg daily  You will be scheduled for a MRA of the neck at [facility] at [address]  .Dr. Royann Shivers recommends that you schedule a follow-up appointment in: 3-4 months           Signed, Thurmon Fair, MD  01/12/2016 3:46 PM    [facility] [address] [place], [place], [place]  [address] Phone: [phone]; Fax: [phone]

## 2016-01-10 NOTE — Patient Instructions (Signed)
Your physician has recommended that you wear a 30 day event monitor. Event monitors are medical devices that record the heart's electrical activity. Doctors most often [place] these monitors to diagnose arrhythmias. Arrhythmias are problems with the speed or rhythm of the heartbeat. The monitor is a small, portable device. You can wear one while you do your normal daily activities. This is usually used to diagnose what is causing palpitations/syncope (passing out).  Your physician has recommended you make the following change in your medication: decrease diltiazem to 180 mg daily  You will be scheduled for a MRA of the neck at [facility] at [address]  .Dr. Royann Shivers recommends that you schedule a follow-up appointment in: 3-4 months

## 2016-01-10 NOTE — Telephone Encounter (Signed)
New Message  RN from [facility] imaging called, stated that the order for the MR angiogram of the neck should be put in as "with and without contrast", not just "with contrast" Please advise

## 2016-01-10 NOTE — Telephone Encounter (Signed)
New order entered for MR Angio Neck with and without contrast.

## 2016-01-12 ENCOUNTER — Encounter: Payer: Self-pay | Admitting: Cardiovascular Disease

## 2016-01-22 ENCOUNTER — Other Ambulatory Visit: Payer: Self-pay

## 2016-01-23 DIAGNOSIS — I480 Paroxysmal atrial fibrillation: Secondary | ICD-10-CM | POA: Diagnosis not present

## 2016-01-23 DIAGNOSIS — I10 Essential (primary) hypertension: Secondary | ICD-10-CM | POA: Diagnosis not present

## 2016-01-23 DIAGNOSIS — M545 Low back pain: Secondary | ICD-10-CM | POA: Diagnosis not present

## 2016-01-23 DIAGNOSIS — J45909 Unspecified asthma, uncomplicated: Secondary | ICD-10-CM | POA: Diagnosis not present

## 2016-01-23 DIAGNOSIS — R42 Dizziness and giddiness: Secondary | ICD-10-CM | POA: Diagnosis not present

## 2016-01-26 ENCOUNTER — Other Ambulatory Visit: Payer: Self-pay

## 2016-01-26 DIAGNOSIS — R2689 Other abnormalities of gait and mobility: Secondary | ICD-10-CM | POA: Diagnosis not present

## 2016-01-26 DIAGNOSIS — R2681 Unsteadiness on feet: Secondary | ICD-10-CM | POA: Diagnosis not present

## 2016-01-26 DIAGNOSIS — M6281 Muscle weakness (generalized): Secondary | ICD-10-CM | POA: Diagnosis not present

## 2016-01-26 DIAGNOSIS — R296 Repeated falls: Secondary | ICD-10-CM | POA: Diagnosis not present

## 2016-01-27 DIAGNOSIS — R2681 Unsteadiness on feet: Secondary | ICD-10-CM | POA: Diagnosis not present

## 2016-01-27 DIAGNOSIS — R2689 Other abnormalities of gait and mobility: Secondary | ICD-10-CM | POA: Diagnosis not present

## 2016-01-27 DIAGNOSIS — M6281 Muscle weakness (generalized): Secondary | ICD-10-CM | POA: Diagnosis not present

## 2016-01-27 DIAGNOSIS — R296 Repeated falls: Secondary | ICD-10-CM | POA: Diagnosis not present

## 2016-01-29 DIAGNOSIS — M6281 Muscle weakness (generalized): Secondary | ICD-10-CM | POA: Diagnosis not present

## 2016-01-29 DIAGNOSIS — R296 Repeated falls: Secondary | ICD-10-CM | POA: Diagnosis not present

## 2016-01-29 DIAGNOSIS — R2689 Other abnormalities of gait and mobility: Secondary | ICD-10-CM | POA: Diagnosis not present

## 2016-01-29 DIAGNOSIS — R2681 Unsteadiness on feet: Secondary | ICD-10-CM | POA: Diagnosis not present

## 2016-01-30 DIAGNOSIS — R05 Cough: Secondary | ICD-10-CM | POA: Diagnosis not present

## 2016-01-30 DIAGNOSIS — I10 Essential (primary) hypertension: Secondary | ICD-10-CM | POA: Diagnosis not present

## 2016-01-30 DIAGNOSIS — R0602 Shortness of breath: Secondary | ICD-10-CM | POA: Diagnosis not present

## 2016-01-30 DIAGNOSIS — J45909 Unspecified asthma, uncomplicated: Secondary | ICD-10-CM | POA: Diagnosis not present

## 2016-01-30 DIAGNOSIS — R2689 Other abnormalities of gait and mobility: Secondary | ICD-10-CM | POA: Diagnosis not present

## 2016-01-30 DIAGNOSIS — R296 Repeated falls: Secondary | ICD-10-CM | POA: Diagnosis not present

## 2016-01-30 DIAGNOSIS — R2681 Unsteadiness on feet: Secondary | ICD-10-CM | POA: Diagnosis not present

## 2016-01-30 DIAGNOSIS — M6281 Muscle weakness (generalized): Secondary | ICD-10-CM | POA: Diagnosis not present

## 2016-02-01 ENCOUNTER — Other Ambulatory Visit: Payer: Self-pay

## 2016-02-01 DIAGNOSIS — R2681 Unsteadiness on feet: Secondary | ICD-10-CM | POA: Diagnosis not present

## 2016-02-01 DIAGNOSIS — R2689 Other abnormalities of gait and mobility: Secondary | ICD-10-CM | POA: Diagnosis not present

## 2016-02-01 DIAGNOSIS — R296 Repeated falls: Secondary | ICD-10-CM | POA: Diagnosis not present

## 2016-02-01 DIAGNOSIS — M6281 Muscle weakness (generalized): Secondary | ICD-10-CM | POA: Diagnosis not present

## 2016-02-02 DIAGNOSIS — R2689 Other abnormalities of gait and mobility: Secondary | ICD-10-CM | POA: Diagnosis not present

## 2016-02-02 DIAGNOSIS — R296 Repeated falls: Secondary | ICD-10-CM | POA: Diagnosis not present

## 2016-02-02 DIAGNOSIS — M6281 Muscle weakness (generalized): Secondary | ICD-10-CM | POA: Diagnosis not present

## 2016-02-02 DIAGNOSIS — R2681 Unsteadiness on feet: Secondary | ICD-10-CM | POA: Diagnosis not present

## 2016-02-05 DIAGNOSIS — R2681 Unsteadiness on feet: Secondary | ICD-10-CM | POA: Diagnosis not present

## 2016-02-05 DIAGNOSIS — M6281 Muscle weakness (generalized): Secondary | ICD-10-CM | POA: Diagnosis not present

## 2016-02-05 DIAGNOSIS — R2689 Other abnormalities of gait and mobility: Secondary | ICD-10-CM | POA: Diagnosis not present

## 2016-02-05 DIAGNOSIS — R296 Repeated falls: Secondary | ICD-10-CM | POA: Diagnosis not present

## 2016-02-06 DIAGNOSIS — R296 Repeated falls: Secondary | ICD-10-CM | POA: Diagnosis not present

## 2016-02-06 DIAGNOSIS — M6281 Muscle weakness (generalized): Secondary | ICD-10-CM | POA: Diagnosis not present

## 2016-02-06 DIAGNOSIS — R2681 Unsteadiness on feet: Secondary | ICD-10-CM | POA: Diagnosis not present

## 2016-02-06 DIAGNOSIS — R2689 Other abnormalities of gait and mobility: Secondary | ICD-10-CM | POA: Diagnosis not present

## 2016-02-08 DIAGNOSIS — R296 Repeated falls: Secondary | ICD-10-CM | POA: Diagnosis not present

## 2016-02-08 DIAGNOSIS — R2681 Unsteadiness on feet: Secondary | ICD-10-CM | POA: Diagnosis not present

## 2016-02-08 DIAGNOSIS — M6281 Muscle weakness (generalized): Secondary | ICD-10-CM | POA: Diagnosis not present

## 2016-02-08 DIAGNOSIS — R2689 Other abnormalities of gait and mobility: Secondary | ICD-10-CM | POA: Diagnosis not present

## 2016-02-09 ENCOUNTER — Other Ambulatory Visit: Payer: Self-pay | Admitting: Cardiovascular Disease

## 2016-02-09 ENCOUNTER — Telehealth: Payer: Self-pay | Admitting: *Deleted

## 2016-02-09 ENCOUNTER — Ambulatory Visit
Admission: RE | Admit: 2016-02-09 | Discharge: 2016-02-09 | Disposition: A | Discharge status: Home / Self Care | Payer: Medicare Other | Source: Ambulatory Visit | Attending: Cardiovascular Disease | Admitting: Cardiovascular Disease

## 2016-02-09 DIAGNOSIS — G450 Vertebro-basilar artery syndrome: Secondary | ICD-10-CM

## 2016-02-09 NOTE — Telephone Encounter (Signed)
LEFT MESSAGE TO CALL BACK

## 2016-02-09 NOTE — Telephone Encounter (Signed)
-----   Message from Thurmon FairMihai Croitoru, MD sent at 02/09/2016  1:24 PM EDT ----- Normal MRA. Does not help explain her falls, but good news.

## 2016-02-12 DIAGNOSIS — R296 Repeated falls: Secondary | ICD-10-CM | POA: Diagnosis not present

## 2016-02-12 DIAGNOSIS — R2681 Unsteadiness on feet: Secondary | ICD-10-CM | POA: Diagnosis not present

## 2016-02-12 DIAGNOSIS — M6281 Muscle weakness (generalized): Secondary | ICD-10-CM | POA: Diagnosis not present

## 2016-02-12 DIAGNOSIS — R2689 Other abnormalities of gait and mobility: Secondary | ICD-10-CM | POA: Diagnosis not present

## 2016-02-12 NOTE — Telephone Encounter (Signed)
Spoke to patient. Result given . Verbalized understanding  

## 2016-02-14 DIAGNOSIS — R296 Repeated falls: Secondary | ICD-10-CM | POA: Diagnosis not present

## 2016-02-14 DIAGNOSIS — M6281 Muscle weakness (generalized): Secondary | ICD-10-CM | POA: Diagnosis not present

## 2016-02-14 DIAGNOSIS — R2689 Other abnormalities of gait and mobility: Secondary | ICD-10-CM | POA: Diagnosis not present

## 2016-02-14 DIAGNOSIS — R2681 Unsteadiness on feet: Secondary | ICD-10-CM | POA: Diagnosis not present

## 2016-02-17 DIAGNOSIS — R2681 Unsteadiness on feet: Secondary | ICD-10-CM | POA: Diagnosis not present

## 2016-02-17 DIAGNOSIS — M6281 Muscle weakness (generalized): Secondary | ICD-10-CM | POA: Diagnosis not present

## 2016-02-17 DIAGNOSIS — R2689 Other abnormalities of gait and mobility: Secondary | ICD-10-CM | POA: Diagnosis not present

## 2016-02-17 DIAGNOSIS — R296 Repeated falls: Secondary | ICD-10-CM | POA: Diagnosis not present

## 2016-02-18 DIAGNOSIS — R296 Repeated falls: Secondary | ICD-10-CM | POA: Diagnosis not present

## 2016-02-18 DIAGNOSIS — R2689 Other abnormalities of gait and mobility: Secondary | ICD-10-CM | POA: Diagnosis not present

## 2016-02-18 DIAGNOSIS — R2681 Unsteadiness on feet: Secondary | ICD-10-CM | POA: Diagnosis not present

## 2016-02-18 DIAGNOSIS — M6281 Muscle weakness (generalized): Secondary | ICD-10-CM | POA: Diagnosis not present

## 2016-02-20 DIAGNOSIS — M6281 Muscle weakness (generalized): Secondary | ICD-10-CM | POA: Diagnosis not present

## 2016-02-20 DIAGNOSIS — R2681 Unsteadiness on feet: Secondary | ICD-10-CM | POA: Diagnosis not present

## 2016-02-20 DIAGNOSIS — R296 Repeated falls: Secondary | ICD-10-CM | POA: Diagnosis not present

## 2016-02-20 DIAGNOSIS — R2689 Other abnormalities of gait and mobility: Secondary | ICD-10-CM | POA: Diagnosis not present

## 2016-02-20 DIAGNOSIS — G4733 Obstructive sleep apnea (adult) (pediatric): Secondary | ICD-10-CM | POA: Diagnosis not present

## 2016-02-27 DIAGNOSIS — R296 Repeated falls: Secondary | ICD-10-CM | POA: Diagnosis not present

## 2016-02-27 DIAGNOSIS — I10 Essential (primary) hypertension: Secondary | ICD-10-CM | POA: Diagnosis not present

## 2016-02-27 DIAGNOSIS — R2689 Other abnormalities of gait and mobility: Secondary | ICD-10-CM | POA: Diagnosis not present

## 2016-02-27 DIAGNOSIS — M6281 Muscle weakness (generalized): Secondary | ICD-10-CM | POA: Diagnosis not present

## 2016-02-27 DIAGNOSIS — R2681 Unsteadiness on feet: Secondary | ICD-10-CM | POA: Diagnosis not present

## 2016-02-27 DIAGNOSIS — I480 Paroxysmal atrial fibrillation: Secondary | ICD-10-CM | POA: Diagnosis not present

## 2016-02-28 DIAGNOSIS — R55 Syncope and collapse: Secondary | ICD-10-CM | POA: Diagnosis not present

## 2016-02-29 DIAGNOSIS — R296 Repeated falls: Secondary | ICD-10-CM | POA: Diagnosis not present

## 2016-02-29 DIAGNOSIS — M6281 Muscle weakness (generalized): Secondary | ICD-10-CM | POA: Diagnosis not present

## 2016-02-29 DIAGNOSIS — R2681 Unsteadiness on feet: Secondary | ICD-10-CM | POA: Diagnosis not present

## 2016-02-29 DIAGNOSIS — R2689 Other abnormalities of gait and mobility: Secondary | ICD-10-CM | POA: Diagnosis not present

## 2016-03-01 DIAGNOSIS — R2689 Other abnormalities of gait and mobility: Secondary | ICD-10-CM | POA: Diagnosis not present

## 2016-03-01 DIAGNOSIS — R2681 Unsteadiness on feet: Secondary | ICD-10-CM | POA: Diagnosis not present

## 2016-03-01 DIAGNOSIS — M6281 Muscle weakness (generalized): Secondary | ICD-10-CM | POA: Diagnosis not present

## 2016-03-01 DIAGNOSIS — R296 Repeated falls: Secondary | ICD-10-CM | POA: Diagnosis not present

## 2016-03-04 DIAGNOSIS — R296 Repeated falls: Secondary | ICD-10-CM | POA: Diagnosis not present

## 2016-03-04 DIAGNOSIS — M6281 Muscle weakness (generalized): Secondary | ICD-10-CM | POA: Diagnosis not present

## 2016-03-04 DIAGNOSIS — R2681 Unsteadiness on feet: Secondary | ICD-10-CM | POA: Diagnosis not present

## 2016-03-04 DIAGNOSIS — R2689 Other abnormalities of gait and mobility: Secondary | ICD-10-CM | POA: Diagnosis not present

## 2016-03-06 DIAGNOSIS — M6281 Muscle weakness (generalized): Secondary | ICD-10-CM | POA: Diagnosis not present

## 2016-03-06 DIAGNOSIS — R2689 Other abnormalities of gait and mobility: Secondary | ICD-10-CM | POA: Diagnosis not present

## 2016-03-06 DIAGNOSIS — R2681 Unsteadiness on feet: Secondary | ICD-10-CM | POA: Diagnosis not present

## 2016-03-06 DIAGNOSIS — R296 Repeated falls: Secondary | ICD-10-CM | POA: Diagnosis not present

## 2016-03-07 DIAGNOSIS — R2681 Unsteadiness on feet: Secondary | ICD-10-CM | POA: Diagnosis not present

## 2016-03-07 DIAGNOSIS — R296 Repeated falls: Secondary | ICD-10-CM | POA: Diagnosis not present

## 2016-03-07 DIAGNOSIS — R2689 Other abnormalities of gait and mobility: Secondary | ICD-10-CM | POA: Diagnosis not present

## 2016-03-07 DIAGNOSIS — M6281 Muscle weakness (generalized): Secondary | ICD-10-CM | POA: Diagnosis not present

## 2016-03-12 DIAGNOSIS — R2689 Other abnormalities of gait and mobility: Secondary | ICD-10-CM | POA: Diagnosis not present

## 2016-03-12 DIAGNOSIS — M6281 Muscle weakness (generalized): Secondary | ICD-10-CM | POA: Diagnosis not present

## 2016-03-12 DIAGNOSIS — R2681 Unsteadiness on feet: Secondary | ICD-10-CM | POA: Diagnosis not present

## 2016-03-12 DIAGNOSIS — R296 Repeated falls: Secondary | ICD-10-CM | POA: Diagnosis not present

## 2016-03-14 DIAGNOSIS — R2689 Other abnormalities of gait and mobility: Secondary | ICD-10-CM | POA: Diagnosis not present

## 2016-03-14 DIAGNOSIS — M6281 Muscle weakness (generalized): Secondary | ICD-10-CM | POA: Diagnosis not present

## 2016-03-14 DIAGNOSIS — R296 Repeated falls: Secondary | ICD-10-CM | POA: Diagnosis not present

## 2016-03-14 DIAGNOSIS — R2681 Unsteadiness on feet: Secondary | ICD-10-CM | POA: Diagnosis not present

## 2016-03-15 DIAGNOSIS — R2689 Other abnormalities of gait and mobility: Secondary | ICD-10-CM | POA: Diagnosis not present

## 2016-03-15 DIAGNOSIS — R296 Repeated falls: Secondary | ICD-10-CM | POA: Diagnosis not present

## 2016-03-15 DIAGNOSIS — M6281 Muscle weakness (generalized): Secondary | ICD-10-CM | POA: Diagnosis not present

## 2016-03-15 DIAGNOSIS — R2681 Unsteadiness on feet: Secondary | ICD-10-CM | POA: Diagnosis not present

## 2016-03-19 DIAGNOSIS — R296 Repeated falls: Secondary | ICD-10-CM | POA: Diagnosis not present

## 2016-03-19 DIAGNOSIS — R2689 Other abnormalities of gait and mobility: Secondary | ICD-10-CM | POA: Diagnosis not present

## 2016-03-19 DIAGNOSIS — I480 Paroxysmal atrial fibrillation: Secondary | ICD-10-CM | POA: Diagnosis not present

## 2016-03-19 DIAGNOSIS — M6281 Muscle weakness (generalized): Secondary | ICD-10-CM | POA: Diagnosis not present

## 2016-03-19 DIAGNOSIS — R2681 Unsteadiness on feet: Secondary | ICD-10-CM | POA: Diagnosis not present

## 2016-03-19 DIAGNOSIS — I10 Essential (primary) hypertension: Secondary | ICD-10-CM | POA: Diagnosis not present

## 2016-03-21 DIAGNOSIS — M6281 Muscle weakness (generalized): Secondary | ICD-10-CM | POA: Diagnosis not present

## 2016-03-21 DIAGNOSIS — R296 Repeated falls: Secondary | ICD-10-CM | POA: Diagnosis not present

## 2016-03-21 DIAGNOSIS — R2681 Unsteadiness on feet: Secondary | ICD-10-CM | POA: Diagnosis not present

## 2016-03-21 DIAGNOSIS — R2689 Other abnormalities of gait and mobility: Secondary | ICD-10-CM | POA: Diagnosis not present

## 2016-03-22 DIAGNOSIS — R2681 Unsteadiness on feet: Secondary | ICD-10-CM | POA: Diagnosis not present

## 2016-03-22 DIAGNOSIS — M6281 Muscle weakness (generalized): Secondary | ICD-10-CM | POA: Diagnosis not present

## 2016-03-22 DIAGNOSIS — R296 Repeated falls: Secondary | ICD-10-CM | POA: Diagnosis not present

## 2016-03-22 DIAGNOSIS — R2689 Other abnormalities of gait and mobility: Secondary | ICD-10-CM | POA: Diagnosis not present

## 2016-03-26 DIAGNOSIS — R2681 Unsteadiness on feet: Secondary | ICD-10-CM | POA: Diagnosis not present

## 2016-03-26 DIAGNOSIS — R2689 Other abnormalities of gait and mobility: Secondary | ICD-10-CM | POA: Diagnosis not present

## 2016-03-26 DIAGNOSIS — R296 Repeated falls: Secondary | ICD-10-CM | POA: Diagnosis not present

## 2016-03-26 DIAGNOSIS — M6281 Muscle weakness (generalized): Secondary | ICD-10-CM | POA: Diagnosis not present

## 2016-03-28 DIAGNOSIS — R296 Repeated falls: Secondary | ICD-10-CM | POA: Diagnosis not present

## 2016-03-28 DIAGNOSIS — R2689 Other abnormalities of gait and mobility: Secondary | ICD-10-CM | POA: Diagnosis not present

## 2016-03-28 DIAGNOSIS — M6281 Muscle weakness (generalized): Secondary | ICD-10-CM | POA: Diagnosis not present

## 2016-03-28 DIAGNOSIS — R2681 Unsteadiness on feet: Secondary | ICD-10-CM | POA: Diagnosis not present

## 2016-03-29 DIAGNOSIS — M6281 Muscle weakness (generalized): Secondary | ICD-10-CM | POA: Diagnosis not present

## 2016-03-29 DIAGNOSIS — R2681 Unsteadiness on feet: Secondary | ICD-10-CM | POA: Diagnosis not present

## 2016-03-29 DIAGNOSIS — R296 Repeated falls: Secondary | ICD-10-CM | POA: Diagnosis not present

## 2016-03-29 DIAGNOSIS — R2689 Other abnormalities of gait and mobility: Secondary | ICD-10-CM | POA: Diagnosis not present

## 2016-04-01 DIAGNOSIS — R2689 Other abnormalities of gait and mobility: Secondary | ICD-10-CM | POA: Diagnosis not present

## 2016-04-01 DIAGNOSIS — R2681 Unsteadiness on feet: Secondary | ICD-10-CM | POA: Diagnosis not present

## 2016-04-01 DIAGNOSIS — M6281 Muscle weakness (generalized): Secondary | ICD-10-CM | POA: Diagnosis not present

## 2016-04-01 DIAGNOSIS — R296 Repeated falls: Secondary | ICD-10-CM | POA: Diagnosis not present

## 2016-04-02 DIAGNOSIS — M6281 Muscle weakness (generalized): Secondary | ICD-10-CM | POA: Diagnosis not present

## 2016-04-02 DIAGNOSIS — R2689 Other abnormalities of gait and mobility: Secondary | ICD-10-CM | POA: Diagnosis not present

## 2016-04-02 DIAGNOSIS — R296 Repeated falls: Secondary | ICD-10-CM | POA: Diagnosis not present

## 2016-04-02 DIAGNOSIS — R2681 Unsteadiness on feet: Secondary | ICD-10-CM | POA: Diagnosis not present

## 2016-04-05 ENCOUNTER — Telehealth: Payer: Self-pay | Admitting: Neurology

## 2016-04-05 DIAGNOSIS — R2681 Unsteadiness on feet: Secondary | ICD-10-CM | POA: Diagnosis not present

## 2016-04-05 DIAGNOSIS — M6281 Muscle weakness (generalized): Secondary | ICD-10-CM | POA: Diagnosis not present

## 2016-04-05 DIAGNOSIS — R296 Repeated falls: Secondary | ICD-10-CM | POA: Diagnosis not present

## 2016-04-05 DIAGNOSIS — R2689 Other abnormalities of gait and mobility: Secondary | ICD-10-CM | POA: Diagnosis not present

## 2016-04-05 NOTE — Telephone Encounter (Signed)
Claire/[facility] [phone] called said she faxed a request for MRI on 5./5./17 but has not rec'd it yet. please call her

## 2016-04-09 DIAGNOSIS — M6281 Muscle weakness (generalized): Secondary | ICD-10-CM | POA: Diagnosis not present

## 2016-04-09 DIAGNOSIS — R296 Repeated falls: Secondary | ICD-10-CM | POA: Diagnosis not present

## 2016-04-09 DIAGNOSIS — R2681 Unsteadiness on feet: Secondary | ICD-10-CM | POA: Diagnosis not present

## 2016-04-09 DIAGNOSIS — R2689 Other abnormalities of gait and mobility: Secondary | ICD-10-CM | POA: Diagnosis not present

## 2016-04-11 DIAGNOSIS — R2689 Other abnormalities of gait and mobility: Secondary | ICD-10-CM | POA: Diagnosis not present

## 2016-04-11 DIAGNOSIS — R296 Repeated falls: Secondary | ICD-10-CM | POA: Diagnosis not present

## 2016-04-11 DIAGNOSIS — M6281 Muscle weakness (generalized): Secondary | ICD-10-CM | POA: Diagnosis not present

## 2016-04-11 DIAGNOSIS — R2681 Unsteadiness on feet: Secondary | ICD-10-CM | POA: Diagnosis not present

## 2016-04-12 DIAGNOSIS — R296 Repeated falls: Secondary | ICD-10-CM | POA: Diagnosis not present

## 2016-04-12 DIAGNOSIS — R2689 Other abnormalities of gait and mobility: Secondary | ICD-10-CM | POA: Diagnosis not present

## 2016-04-12 DIAGNOSIS — M6281 Muscle weakness (generalized): Secondary | ICD-10-CM | POA: Diagnosis not present

## 2016-04-12 DIAGNOSIS — R2681 Unsteadiness on feet: Secondary | ICD-10-CM | POA: Diagnosis not present

## 2016-04-16 DIAGNOSIS — R2689 Other abnormalities of gait and mobility: Secondary | ICD-10-CM | POA: Diagnosis not present

## 2016-04-16 DIAGNOSIS — M6281 Muscle weakness (generalized): Secondary | ICD-10-CM | POA: Diagnosis not present

## 2016-04-16 DIAGNOSIS — G4730 Sleep apnea, unspecified: Secondary | ICD-10-CM | POA: Diagnosis not present

## 2016-04-16 DIAGNOSIS — R296 Repeated falls: Secondary | ICD-10-CM | POA: Diagnosis not present

## 2016-04-16 DIAGNOSIS — R2681 Unsteadiness on feet: Secondary | ICD-10-CM | POA: Diagnosis not present

## 2016-04-16 DIAGNOSIS — I10 Essential (primary) hypertension: Secondary | ICD-10-CM | POA: Diagnosis not present

## 2016-04-18 DIAGNOSIS — R296 Repeated falls: Secondary | ICD-10-CM | POA: Diagnosis not present

## 2016-04-18 DIAGNOSIS — R2681 Unsteadiness on feet: Secondary | ICD-10-CM | POA: Diagnosis not present

## 2016-04-18 DIAGNOSIS — M6281 Muscle weakness (generalized): Secondary | ICD-10-CM | POA: Diagnosis not present

## 2016-04-18 DIAGNOSIS — R2689 Other abnormalities of gait and mobility: Secondary | ICD-10-CM | POA: Diagnosis not present

## 2016-04-22 DIAGNOSIS — R2689 Other abnormalities of gait and mobility: Secondary | ICD-10-CM | POA: Diagnosis not present

## 2016-04-22 DIAGNOSIS — R296 Repeated falls: Secondary | ICD-10-CM | POA: Diagnosis not present

## 2016-04-22 DIAGNOSIS — R2681 Unsteadiness on feet: Secondary | ICD-10-CM | POA: Diagnosis not present

## 2016-04-22 DIAGNOSIS — M6281 Muscle weakness (generalized): Secondary | ICD-10-CM | POA: Diagnosis not present

## 2016-04-23 ENCOUNTER — Telehealth: Payer: Self-pay

## 2016-04-23 ENCOUNTER — Ambulatory Visit: Payer: Self-pay | Admitting: Neurology

## 2016-04-23 NOTE — Telephone Encounter (Signed)
Pt no showed f/u appt.  

## 2016-04-25 ENCOUNTER — Encounter: Payer: Self-pay | Admitting: Neurology

## 2016-04-26 ENCOUNTER — Emergency Department (HOSPITAL_COMMUNITY): Payer: Medicare Other

## 2016-04-26 ENCOUNTER — Emergency Department (HOSPITAL_COMMUNITY)
Admission: EM | Admit: 2016-04-26 | Discharge: 2016-04-26 | Disposition: A | Discharge status: Home / Self Care | Payer: Medicare Other | Attending: Emergency Medicine | Admitting: Emergency Medicine

## 2016-04-26 ENCOUNTER — Emergency Department (EMERGENCY_DEPARTMENT_HOSPITAL)
Admit: 2016-04-26 | Discharge: 2016-04-26 | Disposition: A | Discharge status: Home / Self Care | Payer: Medicare Other | Attending: Emergency Medicine | Admitting: Emergency Medicine

## 2016-04-26 ENCOUNTER — Encounter (HOSPITAL_COMMUNITY): Payer: Self-pay

## 2016-04-26 DIAGNOSIS — M7989 Other specified soft tissue disorders: Secondary | ICD-10-CM | POA: Diagnosis not present

## 2016-04-26 DIAGNOSIS — Z96652 Presence of left artificial knee joint: Secondary | ICD-10-CM | POA: Insufficient documentation

## 2016-04-26 DIAGNOSIS — M79601 Pain in right arm: Secondary | ICD-10-CM | POA: Diagnosis present

## 2016-04-26 DIAGNOSIS — Z79891 Long term (current) use of opiate analgesic: Secondary | ICD-10-CM | POA: Diagnosis not present

## 2016-04-26 DIAGNOSIS — N183 Chronic kidney disease, stage 3 (moderate): Secondary | ICD-10-CM | POA: Diagnosis not present

## 2016-04-26 DIAGNOSIS — I129 Hypertensive chronic kidney disease with stage 1 through stage 4 chronic kidney disease, or unspecified chronic kidney disease: Secondary | ICD-10-CM | POA: Diagnosis not present

## 2016-04-26 DIAGNOSIS — Z79899 Other long term (current) drug therapy: Secondary | ICD-10-CM | POA: Diagnosis not present

## 2016-04-26 DIAGNOSIS — J45909 Unspecified asthma, uncomplicated: Secondary | ICD-10-CM | POA: Diagnosis not present

## 2016-04-26 DIAGNOSIS — R05 Cough: Secondary | ICD-10-CM | POA: Diagnosis not present

## 2016-04-26 LAB — CBC WITH DIFFERENTIAL/PLATELET
BASOS PCT: 0 %
Basophils Absolute: 0 10*3/uL (ref 0.0–0.1)
EOS ABS: 0.1 10*3/uL (ref 0.0–0.7)
Eosinophils Relative: 1 %
HEMATOCRIT: 38 % (ref 36.0–46.0)
HEMOGLOBIN: 12.5 g/dL (ref 12.0–15.0)
LYMPHS ABS: 1.2 10*3/uL (ref 0.7–4.0)
Lymphocytes Relative: 22 %
MCH: 30.1 pg (ref 26.0–34.0)
MCHC: 32.9 g/dL (ref 30.0–36.0)
MCV: 91.6 fL (ref 78.0–100.0)
MONOS PCT: 10 %
Monocytes Absolute: 0.5 10*3/uL (ref 0.1–1.0)
NEUTROS ABS: 3.7 10*3/uL (ref 1.7–7.7)
NEUTROS PCT: 67 %
Platelets: 118 10*3/uL — ABNORMAL LOW (ref 150–400)
RBC: 4.15 MIL/uL (ref 3.87–5.11)
RDW: 13.4 % (ref 11.5–15.5)
WBC: 5.5 10*3/uL (ref 4.0–10.5)

## 2016-04-26 LAB — BASIC METABOLIC PANEL
Anion gap: 6 (ref 5–15)
BUN: 23 mg/dL — ABNORMAL HIGH (ref 6–20)
CHLORIDE: 107 mmol/L (ref 101–111)
CO2: 29 mmol/L (ref 22–32)
Calcium: 9.7 mg/dL (ref 8.9–10.3)
Creatinine, Ser: 2.03 mg/dL — ABNORMAL HIGH (ref 0.44–1.00)
GFR calc non Af Amer: 23 mL/min — ABNORMAL LOW (ref >60)
GFR, EST AFRICAN AMERICAN: 27 mL/min — AB (ref >60)
Glucose, Bld: 95 mg/dL (ref 65–99)
POTASSIUM: 4.3 mmol/L (ref 3.5–5.1)
SODIUM: 142 mmol/L (ref 135–145)

## 2016-04-26 MED ORDER — DIPHENHYDRAMINE HCL 25 MG PO CAPS
25.0000 mg | ORAL_CAPSULE | Freq: Once | ORAL | Status: AC
  Administered 2016-04-26: 25 mg via ORAL
  Filled 2016-04-26: qty 1

## 2016-04-26 MED ORDER — TRAMADOL HCL 50 MG PO TABS
50.0000 mg | ORAL_TABLET | Freq: Once | ORAL | Status: AC
  Administered 2016-04-26: 50 mg via ORAL
  Filled 2016-04-26: qty 1

## 2016-04-26 MED ORDER — IPRATROPIUM-ALBUTEROL 0.5-2.5 (3) MG/3ML IN SOLN
3.0000 mL | Freq: Once | RESPIRATORY_TRACT | Status: AC
  Administered 2016-04-26: 3 mL via RESPIRATORY_TRACT
  Filled 2016-04-26: qty 3

## 2016-04-26 MED ORDER — PROCHLORPERAZINE MALEATE 10 MG PO TABS
10.0000 mg | ORAL_TABLET | Freq: Once | ORAL | Status: AC
  Administered 2016-04-26: 10 mg via ORAL
  Filled 2016-04-26: qty 1

## 2016-04-26 MED ORDER — ALBUTEROL SULFATE HFA 108 (90 BASE) MCG/ACT IN AERS
2.0000 | INHALATION_SPRAY | RESPIRATORY_TRACT | Status: DC | PRN
  Filled 2016-04-26: qty 6.7

## 2016-04-26 MED ORDER — ACETAMINOPHEN 500 MG PO TABS
500.0000 mg | ORAL_TABLET | Freq: Once | ORAL | Status: AC
  Administered 2016-04-26: 500 mg via ORAL
  Filled 2016-04-26: qty 1

## 2016-04-26 NOTE — ED Notes (Signed)
Vascular ultra sound at bedside.

## 2016-04-26 NOTE — ED Notes (Signed)
Pt. Coming from [facility] for arm swelling/pain that woke her up last night. Pt. Ring cut off right ring finger by EMS. Pt. States the pain shoots down to her elbow. Pt. Also noted to have peripheral edema to bilateral lower legs. Pt.Aox4.

## 2016-04-26 NOTE — ED Notes (Signed)
EDP made aware that patient reports her headache has not improved.

## 2016-04-26 NOTE — Discharge Instructions (Signed)
You have been seen today for arm swelling and pain. Your imaging and lab tests showed no acute abnormalities. Follow up with PCP as needed should symptoms continue. Return to ED should symptoms worsen.

## 2016-04-26 NOTE — ED Notes (Signed)
EDP at bedside  

## 2016-04-26 NOTE — ED Provider Notes (Signed)
CSN: [id number]     Arrival date & time 04/26/16  1000 History   First MD Initiated Contact with Patient 04/26/16 1006     Chief Complaint  Patient presents with  . Arm Pain     (Consider location/radiation/quality/duration/timing/severity/associated sxs/prior Treatment) HPI   [name] is a 75 y.o. female, with a history of Hypertension and CKD, presenting to the ED with Right arm pain and swelling beginning suddenly this morning at around 8 AM. Patient rates her pain at 7 out of 10, throbbing, radiating from her right forearm down into her hand and up towards her shoulder. Patient states [name] has never had this issue before. Has not taken any medication for it. Patient denies history of cancer, DVT/PE, recent immobilization, or anticoagulation. Patient adds [name] has also had bilateral peripheral lower extremity edema for the last few weeks. Patient further denies fever/chills, neuro deficits, or any other complaints.     Past Medical History  Diagnosis Date  . Hypertension   . Abnormal heart rhythm   . Osteoporosis   . Scoliosis   . Afib (HCC)     h/o  . Reactive airway disease   . Depression   . Echocardiogram abnormal 02/26/11    MVP,mild MR,AOV mildly sclerotic. EF >55%  . Dementia   . Chronic kidney disease (CKD), stage III (moderate)     [name] 01/05/2015  . Hypothyroidism   . Sleep apnea     "suppose to wear a mask; can't tolerate it" (01/05/2015)  . GERD (gastroesophageal reflux disease)   . Arthritis     "hips, shoulders" (01/05/2015)  . Anxiety   . Abnormality of gait 12/21/2015   Past Surgical History  Procedure Laterality Date  . Tubal ligation    . Cesarean section  1967  . Cesarean section  1971  . Open reduction internal fixation (orif) distal radial fracture Left 12/2009    [name] 12/28/2009  . Total knee arthroplasty Left 03/2010    [name] 04/07/2010  . Joint replacement    . Vaginal hysterectomy  1980  . Dilation and curettage of uterus    . Cataract  extraction, bilateral Bilateral    Family History  Problem Relation Age of Onset  . Heart disease Father   . Cancer Father   . Cancer Mother    Social History  Substance Use Topics  . Smoking status: Never Smoker   . Smokeless tobacco: Never Used  . Alcohol Use: No   OB History    No data available     Review of Systems  Constitutional: Negative for fever and chills.  Gastrointestinal: Negative for nausea and vomiting.  Musculoskeletal: Positive for arthralgias (right forearm and hand).  Skin: Negative for color change, pallor, rash and wound.  Neurological: Negative for weakness and numbness.  All other systems reviewed and are negative.     Allergies  Vicodin  Home Medications   Prior to Admission medications   Medication Sig Start Date End Date Taking? Authorizing Provider  baclofen (LIORESAL) 10 MG tablet Take 5 mg by mouth at bedtime as needed for muscle spasms.    Yes Historical Provider, MD  calcitRIOL (ROCALTROL) 0.25 MCG capsule Take 0.25 mcg by mouth. Mondays,Wednesdays, and Fridays   Yes Historical Provider, MD  clonazePAM (KLONOPIN) 2 MG tablet Take 1 tablet (2 mg total) by mouth at bedtime. 12/07/15  Yes Waymon Budge, MD  diltiazem (CARDIZEM CD) 180 MG 24 hr capsule Take 1 capsule (180 mg total) by mouth  daily. 01/10/16  Yes Mihai Croitoru, MD  diltiazem (CARDIZEM) 30 MG tablet Take 30 mg by mouth at bedtime as needed (palpitations).   Yes Historical Provider, MD  levothyroxine (SYNTHROID, LEVOTHROID) 112 MCG tablet Take 112 mcg by mouth daily before breakfast.   Yes Historical Provider, MD  metoprolol tartrate (LOPRESSOR) 25 MG tablet Take 12.5 mg by mouth 2 (two) times daily.    Yes Historical Provider, MD  omega-3 acid ethyl esters (LOVAZA) 1 G capsule Take 1 g by mouth daily.     Yes Historical Provider, MD  ondansetron (ZOFRAN ODT) 4 MG disintegrating tablet Take 1 tablet (4 mg total) by mouth every 8 (eight) hours as needed for nausea. 4mg  ODT q4 hours  prn nausea/vomit 06/28/14  Yes Lorenda HatchetAdam L Rothman, MD  pantoprazole (PROTONIX) 40 MG tablet Take 40 mg by mouth daily.   Yes Historical Provider, MD  polyethylene glycol (MIRALAX / GLYCOLAX) packet Take 17 g by mouth every other day.   Yes Historical Provider, MD  potassium chloride SA (K-DUR,KLOR-CON) 20 MEQ tablet Take 20 mEq by mouth daily.     Yes Historical Provider, MD  pregabalin (LYRICA) 50 MG capsule Take 50 mg by mouth daily.   Yes Historical Provider, MD  rOPINIRole (REQUIP) 0.5 MG tablet Take 0.5 mg by mouth at bedtime.   Yes Historical Provider, MD  sertraline (ZOLOFT) 100 MG tablet Take 150 mg by mouth daily.   Yes Historical Provider, MD  traMADol (ULTRAM) 50 MG tablet Take 50 mg by mouth 3 (three) times daily.    Yes Historical Provider, MD  traZODone (DESYREL) 50 MG tablet Take 50 mg by mouth at bedtime.   Yes Historical Provider, MD  Vitamin D, Ergocalciferol, (DRISDOL) 50000 UNITS CAPS capsule Take 50,000 Units by mouth every 7 (seven) days. Mondays   Yes Historical Provider, MD  oxyCODONE-acetaminophen (PERCOCET/ROXICET) 5-325 MG tablet Take 1 tablet by mouth every 4 (four) hours as needed for severe pain. Patient not taking: Reported on 04/26/2016 10/01/15   Lorre NickAnthony Allen, MD   BP 131/62 mmHg  Pulse 72  Temp(Src) 98.3 F (36.8 C) (Oral)  Resp 15  Ht 5\' 2"  (1.575 m)  Wt 61.236 kg  BMI 24.69 kg/m2  SpO2 98% Physical Exam  Constitutional: [name] is oriented to person, place, and time. [name] appears well-developed and well-nourished. No distress.  HENT:  Head: Normocephalic and atraumatic.  Eyes: Conjunctivae are normal.  Neck: Neck supple.  Cardiovascular: Normal rate, regular rhythm and intact distal pulses.   Pulmonary/Chest: Effort normal. No respiratory distress.  Abdominal: Soft. There is no tenderness. There is no guarding.  Musculoskeletal: [name] exhibits edema and tenderness.  Edema and tenderness noted to the right anterior forearm. Edema extends distally into the hand.  Full range of motion in the patient's right arm. Circulation intact in the fingertips.  Lymphadenopathy:    [name] has no cervical adenopathy.    [name] has no axillary adenopathy.  Neurological: [name] is alert and oriented to person, place, and time.  No sensory deficits. Strength 5 out of 5. Grip strengths equal.  Skin: Skin is warm and dry. [name] is not diaphoretic.  Psychiatric: [name] has a normal mood and affect. Her behavior is normal.  Nursing note and vitals reviewed.   ED Course  Procedures (including critical care time) Labs Review Labs Reviewed  CBC WITH DIFFERENTIAL/PLATELET - Abnormal; Notable for the following:    Platelets 118 (*)    All other components within normal limits  BASIC METABOLIC PANEL -  Abnormal; Notable for the following:    BUN 23 (*)    Creatinine, Ser 2.03 (*)    GFR calc non Af Amer 23 (*)    GFR calc Af Amer 27 (*)    All other components within normal limits    Imaging Review Dg Chest 2 View  04/26/2016  CLINICAL DATA:  Chest tightness and cough EXAM: CHEST  2 VIEW COMPARISON:  01/08/2015 FINDINGS: Cardiac shadow is within normal limits. The lungs are well aerated bilaterally. Scoliosis of the thoracolumbar spine is noted stable from the prior exam. No focal infiltrate or sizable effusion is seen. IMPRESSION: No active cardiopulmonary disease. Electronically Signed   By: Alcide Clever M.D.   On: 04/26/2016 11:57   I have personally reviewed and evaluated these images and lab results as part of my medical decision-making.   EKG Interpretation None      MDM   Final diagnoses:  Arm swelling    BOSTON CATARINO presents with right arm swelling and tenderness beginning this morning.  Findings and plan of care discussed with Lavera Guise, MD. Dr. Verdie Mosher personally evaluated and examined this patient.  Upper extremity ultrasound to investigate and rule out DVT. Basic labs to assure proper kidney function and lack of infection. Chest x-ray to check for increased  SVR by examining the heart silhouette. No acute abnormalities on the chest x-ray. While here in the ED, patient began to complain of a generalized headache. Patient states this headache is similar to her previous headaches. No neuro deficits. Patient states usually takes tramadol for her headaches. No acute, significant abnormalities on the patient's labs. No thrombosis or abnormality found on ultrasound. Patient to follow up with PCP on this matter. Return precautions discussed. Patient voiced understanding of these instructions, accepts the plan, and is comfortable with discharge.  Filed Vitals:   04/26/16 1130 04/26/16 1312 04/26/16 1330 04/26/16 1415  BP: 122/45 129/62 128/65 127/62  Pulse:  74    Temp:      TempSrc:      Resp:  16  15  Height:      Weight:      SpO2:  97%     Filed Vitals:   04/26/16 1312 04/26/16 1330 04/26/16 1415 04/26/16 1500  BP: 129/62 128/65 127/62 131/62  Pulse: 74   72  Temp:      TempSrc:      Resp: 16  15 15   Height:      Weight:      SpO2: 97%   98%     Anselm Pancoast, PA-C 04/26/16 1525  Lavera Guise, MD 04/27/16 [phone]

## 2016-04-26 NOTE — Progress Notes (Signed)
Preliminary results by tech - Venous Duplex Upper Ext. Completed. Negative for deep and superficial vein thrombosis.  Milynn Quirion, BS, RDMS, RVT  

## 2016-04-27 DIAGNOSIS — M6281 Muscle weakness (generalized): Secondary | ICD-10-CM | POA: Diagnosis not present

## 2016-04-27 DIAGNOSIS — R2681 Unsteadiness on feet: Secondary | ICD-10-CM | POA: Diagnosis not present

## 2016-04-27 DIAGNOSIS — R296 Repeated falls: Secondary | ICD-10-CM | POA: Diagnosis not present

## 2016-04-27 DIAGNOSIS — R2689 Other abnormalities of gait and mobility: Secondary | ICD-10-CM | POA: Diagnosis not present

## 2016-04-30 ENCOUNTER — Telehealth: Payer: Self-pay | Admitting: Cardiovascular Disease

## 2016-04-30 DIAGNOSIS — R296 Repeated falls: Secondary | ICD-10-CM | POA: Diagnosis not present

## 2016-04-30 DIAGNOSIS — R05 Cough: Secondary | ICD-10-CM | POA: Diagnosis not present

## 2016-04-30 DIAGNOSIS — R2681 Unsteadiness on feet: Secondary | ICD-10-CM | POA: Diagnosis not present

## 2016-04-30 DIAGNOSIS — M545 Low back pain: Secondary | ICD-10-CM | POA: Diagnosis not present

## 2016-04-30 DIAGNOSIS — R42 Dizziness and giddiness: Secondary | ICD-10-CM | POA: Diagnosis not present

## 2016-04-30 DIAGNOSIS — R2689 Other abnormalities of gait and mobility: Secondary | ICD-10-CM | POA: Diagnosis not present

## 2016-04-30 DIAGNOSIS — M6281 Muscle weakness (generalized): Secondary | ICD-10-CM | POA: Diagnosis not present

## 2016-05-02 ENCOUNTER — Ambulatory Visit: Payer: Self-pay | Admitting: Cardiovascular Disease

## 2016-05-02 NOTE — Telephone Encounter (Signed)
Closed encounter °

## 2016-05-03 ENCOUNTER — Telehealth: Payer: Self-pay | Admitting: Cardiovascular Disease

## 2016-05-03 DIAGNOSIS — R296 Repeated falls: Secondary | ICD-10-CM | POA: Diagnosis not present

## 2016-05-03 DIAGNOSIS — R2681 Unsteadiness on feet: Secondary | ICD-10-CM | POA: Diagnosis not present

## 2016-05-03 DIAGNOSIS — M6281 Muscle weakness (generalized): Secondary | ICD-10-CM | POA: Diagnosis not present

## 2016-05-03 DIAGNOSIS — R2689 Other abnormalities of gait and mobility: Secondary | ICD-10-CM | POA: Diagnosis not present

## 2016-05-03 NOTE — Telephone Encounter (Signed)
Spoke to patient she states she sent the monitor back This was the third monitor she sent back  She states it would not stay on her chest. She states this monitor ( had 3 wires and black box) per patient she has tried both.  she wants to know what to do since ,"I don't think I got much for Dr C to see.  Informed patient will have to defer to Dr Royann Shiversroitoru and medical assistant

## 2016-05-03 NOTE — Telephone Encounter (Signed)
We can try an implantable loop recorder

## 2016-05-03 NOTE — Telephone Encounter (Signed)
Called no answer phone rang x 6

## 2016-05-03 NOTE — Telephone Encounter (Signed)
Mrs. [name] is callingto see if she can get another heart monitor placed on . She sent the other back becase she could not keep it attached to her . Please call if you have any questions   Thanks

## 2016-05-03 NOTE — Telephone Encounter (Signed)
EOS report reviewed in April.  Study Highlights     All the strips show normal sinus rhythm. There are occasional PACs and PVCs  There is no correlation between patient reported symptoms and the minor rhythm abnormalities  Normal event monitor. No arrhythmia is associated with reported episodes of lightheadedness.     Signed    Electronically signed by Thurmon FairMihai Croitoru, MD on 03/03/16 at 1233 EDT   Patient does not need to re-wear monitor. Will return call to patient regarding this information.

## 2016-05-10 NOTE — Telephone Encounter (Signed)
Called patient. Patient verbalized understanding and agreed with plan.

## 2016-05-11 DIAGNOSIS — M6281 Muscle weakness (generalized): Secondary | ICD-10-CM | POA: Diagnosis not present

## 2016-05-11 DIAGNOSIS — R2689 Other abnormalities of gait and mobility: Secondary | ICD-10-CM | POA: Diagnosis not present

## 2016-05-11 DIAGNOSIS — R2681 Unsteadiness on feet: Secondary | ICD-10-CM | POA: Diagnosis not present

## 2016-05-11 DIAGNOSIS — R296 Repeated falls: Secondary | ICD-10-CM | POA: Diagnosis not present

## 2016-05-15 DIAGNOSIS — M810 Age-related osteoporosis without current pathological fracture: Secondary | ICD-10-CM | POA: Diagnosis not present

## 2016-05-15 DIAGNOSIS — Z79899 Other long term (current) drug therapy: Secondary | ICD-10-CM | POA: Diagnosis not present

## 2016-06-04 DIAGNOSIS — R2689 Other abnormalities of gait and mobility: Secondary | ICD-10-CM | POA: Diagnosis not present

## 2016-06-04 DIAGNOSIS — I10 Essential (primary) hypertension: Secondary | ICD-10-CM | POA: Diagnosis not present

## 2016-06-04 DIAGNOSIS — I480 Paroxysmal atrial fibrillation: Secondary | ICD-10-CM | POA: Diagnosis not present

## 2016-06-05 ENCOUNTER — Encounter: Payer: Self-pay | Admitting: Neurology

## 2016-06-05 ENCOUNTER — Ambulatory Visit (INDEPENDENT_AMBULATORY_CARE_PROVIDER_SITE_OTHER): Payer: Medicare Other | Admitting: Neurology

## 2016-06-05 VITALS — BP 135/70 | HR 67 | Ht 62.0 in | Wt 137.0 lb

## 2016-06-05 DIAGNOSIS — G2581 Restless legs syndrome: Secondary | ICD-10-CM

## 2016-06-05 DIAGNOSIS — R413 Other amnesia: Secondary | ICD-10-CM | POA: Insufficient documentation

## 2016-06-05 DIAGNOSIS — R269 Unspecified abnormalities of gait and mobility: Secondary | ICD-10-CM

## 2016-06-05 HISTORY — DX: Other amnesia: R41.3

## 2016-06-05 MED ORDER — CLOPIDOGREL BISULFATE 75 MG PO TABS: 75.0000 mg | ORAL_TABLET | Freq: Every day | ORAL | Status: AC

## 2016-06-05 MED ORDER — DONEPEZIL HCL 5 MG PO TABS: 5.0000 mg | ORAL_TABLET | Freq: Every day | ORAL | Status: DC

## 2016-06-05 NOTE — Patient Instructions (Signed)
   Begin Aricept (donepezil) at 5 mg at night for one month. If this medication is well-tolerated, please call our office and we will call in a prescription for the 10 mg tablets. Look out for side effects that may include nausea, diarrhea, weight loss, or stomach cramps. This medication will also cause a runny nose, therefore there is no need for allergy medications for this purpose.  

## 2016-06-05 NOTE — Progress Notes (Signed)
Reason for visit: memory disorder, gait disorder  [name] is an 75 y.o. female  History of present illness:  [name] is a 75 year old right-handed white female with a history of a chronic gait disorder. The patient uses a walker for ambulation, she has been safe with the walking without any falls since last seen in February 2017. The patient has continued to have problems with her memory. She recently has developed some issues with urinary urgency. She has undergone MRA evaluation of the brain that was unremarkable, but the MRI of the brain that was ordered through this office was never done. CT of the head done in December 2016 shows extensive white matter changes. It was felt that the memory issues, balance issues, and urinary control issues are related to this small vessel disease. The patient is not on any antiplatelet agents. She returns to this office for an evaluation. She believes that her memory issues have been relatively stable since she entered her assisted living facility in October 2016.  Past Medical History  Diagnosis Date  . Hypertension   . Abnormal heart rhythm   . Osteoporosis   . Scoliosis   . Afib (HCC)     h/o  . Reactive airway disease   . Depression   . Echocardiogram abnormal 02/26/11    MVP,mild MR,AOV mildly sclerotic. EF >55%  . Dementia   . Chronic kidney disease (CKD), stage III (moderate)     [name] 01/05/2015  . Hypothyroidism   . Sleep apnea     "suppose to wear a mask; can't tolerate it" (01/05/2015)  . GERD (gastroesophageal reflux disease)   . Arthritis     "hips, shoulders" (01/05/2015)  . Anxiety   . Abnormality of gait 12/21/2015  . Memory difficulty 06/05/2016    Past Surgical History  Procedure Laterality Date  . Tubal ligation    . Cesarean section  1967  . Cesarean section  1971  . Open reduction internal fixation (orif) distal radial fracture Left 12/2009    [name] 12/28/2009  . Total knee arthroplasty Left 03/2010   [name] 04/07/2010  . Joint replacement    . Vaginal hysterectomy  1980  . Dilation and curettage of uterus    . Cataract extraction, bilateral Bilateral     Family History  Problem Relation Age of Onset  . Heart disease Father   . Cancer Father   . Cancer Mother     Social history:  reports that she has never smoked. She has never used smokeless tobacco. She reports that she does not drink alcohol or use illicit drugs.    Allergies  Allergen Reactions  . Vicodin [Hydrocodone-Acetaminophen] Nausea And Vomiting    Medications:  Prior to Admission medications   Medication Sig Start Date End Date Taking? Authorizing Provider  baclofen (LIORESAL) 10 MG tablet Take 5 mg by mouth at bedtime as needed for muscle spasms.    Yes Historical Provider, MD  calcitRIOL (ROCALTROL) 0.25 MCG capsule Take 0.25 mcg by mouth. Mondays,Wednesdays, and Fridays   Yes Historical Provider, MD  clonazePAM (KLONOPIN) 2 MG tablet Take 1 tablet (2 mg total) by mouth at bedtime. 12/07/15  Yes Waymon Budge, MD  diltiazem (CARDIZEM CD) 180 MG 24 hr capsule Take 1 capsule (180 mg total) by mouth daily. 01/10/16  Yes Mihai Croitoru, MD  diltiazem (CARDIZEM) 30 MG tablet Take 30 mg by mouth at bedtime as needed (palpitations).   Yes Historical Provider, MD  levothyroxine (SYNTHROID, LEVOTHROID)  112 MCG tablet Take 112 mcg by mouth daily before breakfast.   Yes Historical Provider, MD  metoprolol tartrate (LOPRESSOR) 25 MG tablet Take 12.5 mg by mouth 2 (two) times daily.    Yes Historical Provider, MD  omega-3 acid ethyl esters (LOVAZA) 1 G capsule Take 1 g by mouth daily.     Yes Historical Provider, MD  ondansetron (ZOFRAN ODT) 4 MG disintegrating tablet Take 1 tablet (4 mg total) by mouth every 8 (eight) hours as needed for nausea. 4mg  ODT q4 hours prn nausea/vomit 06/28/14  Yes Lorenda HatchetAdam L Rothman, MD  pantoprazole (PROTONIX) 40 MG tablet Take 40 mg by mouth daily.   Yes Historical Provider, MD  polyethylene glycol  (MIRALAX / GLYCOLAX) packet Take 17 g by mouth every other day.   Yes Historical Provider, MD  potassium chloride SA (K-DUR,KLOR-CON) 20 MEQ tablet Take 20 mEq by mouth daily.     Yes Historical Provider, MD  pregabalin (LYRICA) 50 MG capsule Take 50 mg by mouth daily.   Yes Historical Provider, MD  rOPINIRole (REQUIP) 0.5 MG tablet Take 0.5 mg by mouth at bedtime.   Yes Historical Provider, MD  sertraline (ZOLOFT) 100 MG tablet Take 150 mg by mouth daily.   Yes Historical Provider, MD  SYMBICORT 160-4.5 MCG/ACT inhaler Inhale 2 puffs into the lungs 2 (two) times daily. 05/03/16  Yes Historical Provider, MD  traMADol (ULTRAM) 50 MG tablet Take 50 mg by mouth 3 (three) times daily.    Yes Historical Provider, MD  traZODone (DESYREL) 50 MG tablet Take 50 mg by mouth at bedtime.   Yes Historical Provider, MD  Vitamin D, Ergocalciferol, (DRISDOL) 50000 UNITS CAPS capsule Take 50,000 Units by mouth every 7 (seven) days. Mondays   Yes Historical Provider, MD    ROS:  Out of a complete 14 system review of symptoms, the patient complains only of the following symptoms, and all other reviewed systems are negative.  Memory disturbance Balance difficulty Urinary urgency  Blood pressure 135/70, pulse 67, height 5\' 2"  (1.575 m), weight 137 lb (62.143 kg).  Physical Exam  General: The patient is alert and cooperative at the time of the examination.  Skin: No significant peripheral edema is noted.   Neurologic Exam  Mental status: The patient is alert and oriented x 3 at the time of the examination. The patient has apparent normal recent and remote memory, with an apparently normal attention span and concentration ability. The Mini-Mental status examination done today shows a total score 29/30. The patient was able to name 14 animals in 30 seconds.   Cranial nerves: Facial symmetry is present. Speech is normal, no aphasia or dysarthria is noted. Extraocular movements are full. Visual fields are  full.  Motor: The patient has good strength in all 4 extremities.  Sensory examination: Soft touch sensation is symmetric on the face, arms, and legs.  Coordination: The patient has good finger-nose-finger and heel-to-shin bilaterally.  Gait and station: The patient has a slightly wide-based gait. The patient uses a walker for ambulation. Tandem gait was not attempted. Romberg is negative. No drift is seen.  Reflexes: Deep tendon reflexes are symmetric.   Assessment/Plan:  1. Memory disturbance  2. Gait disturbance  3. Extensive cerebrovascular disease  The patient has significant small vessel ischemic changes by CT the head, this likely explains her memory issues and balance problems and her new issues with her urinary urgency. The patient will be placed on Plavix 75 mg daily, and the patient wishes  to go on medications for memory. We will start low-dose Aricept at this time. She will follow-up in 6 months. She will contact our office if tolerance issues are noted with the new medication. MRI of the brain will be reordered.  Marlan Palau MD 06/05/2016 7:40 PM  [facility] [address] [place], [place] [address]  Phone [phone] Fax [phone]

## 2016-06-06 ENCOUNTER — Telehealth: Payer: Self-pay | Admitting: Neurology

## 2016-06-06 NOTE — Telephone Encounter (Signed)
[facility] [phone] called to advise, order was received for Aricept and Plavix, needs Rx's for Aricept and Plavix.

## 2016-06-06 NOTE — Telephone Encounter (Signed)
Spoke to [employer] - [facility]

## 2016-06-11 DIAGNOSIS — M545 Low back pain: Secondary | ICD-10-CM | POA: Diagnosis not present

## 2016-06-11 DIAGNOSIS — R05 Cough: Secondary | ICD-10-CM | POA: Diagnosis not present

## 2016-06-11 DIAGNOSIS — I10 Essential (primary) hypertension: Secondary | ICD-10-CM | POA: Diagnosis not present

## 2016-06-11 DIAGNOSIS — I480 Paroxysmal atrial fibrillation: Secondary | ICD-10-CM | POA: Diagnosis not present

## 2016-06-21 ENCOUNTER — Inpatient Hospital Stay: Admission: RE | Admit: 2016-06-21 | Payer: Self-pay | Source: Ambulatory Visit

## 2016-06-24 DIAGNOSIS — E038 Other specified hypothyroidism: Secondary | ICD-10-CM | POA: Diagnosis not present

## 2016-06-24 DIAGNOSIS — E119 Type 2 diabetes mellitus without complications: Secondary | ICD-10-CM | POA: Diagnosis not present

## 2016-06-24 DIAGNOSIS — E782 Mixed hyperlipidemia: Secondary | ICD-10-CM | POA: Diagnosis not present

## 2016-06-24 DIAGNOSIS — Z79899 Other long term (current) drug therapy: Secondary | ICD-10-CM | POA: Diagnosis not present

## 2016-06-24 DIAGNOSIS — E559 Vitamin D deficiency, unspecified: Secondary | ICD-10-CM | POA: Diagnosis not present

## 2016-06-24 DIAGNOSIS — D518 Other vitamin B12 deficiency anemias: Secondary | ICD-10-CM | POA: Diagnosis not present

## 2016-06-25 DIAGNOSIS — N183 Chronic kidney disease, stage 3 (moderate): Secondary | ICD-10-CM | POA: Diagnosis not present

## 2016-06-25 DIAGNOSIS — I10 Essential (primary) hypertension: Secondary | ICD-10-CM | POA: Diagnosis not present

## 2016-06-25 DIAGNOSIS — E039 Hypothyroidism, unspecified: Secondary | ICD-10-CM | POA: Diagnosis not present

## 2016-06-25 DIAGNOSIS — G894 Chronic pain syndrome: Secondary | ICD-10-CM | POA: Diagnosis not present

## 2016-07-02 DIAGNOSIS — G2581 Restless legs syndrome: Secondary | ICD-10-CM | POA: Diagnosis not present

## 2016-07-02 DIAGNOSIS — M797 Fibromyalgia: Secondary | ICD-10-CM | POA: Diagnosis not present

## 2016-07-02 DIAGNOSIS — G4709 Other insomnia: Secondary | ICD-10-CM | POA: Diagnosis not present

## 2016-07-03 ENCOUNTER — Ambulatory Visit (INDEPENDENT_AMBULATORY_CARE_PROVIDER_SITE_OTHER): Payer: Medicare Other | Admitting: Cardiovascular Disease

## 2016-07-03 ENCOUNTER — Encounter (INDEPENDENT_AMBULATORY_CARE_PROVIDER_SITE_OTHER): Payer: Self-pay

## 2016-07-03 ENCOUNTER — Encounter: Payer: Self-pay | Admitting: Cardiovascular Disease

## 2016-07-03 VITALS — BP 132/70 | HR 72 | Ht 63.0 in | Wt 135.0 lb

## 2016-07-03 DIAGNOSIS — I471 Supraventricular tachycardia: Secondary | ICD-10-CM

## 2016-07-03 DIAGNOSIS — R413 Other amnesia: Secondary | ICD-10-CM

## 2016-07-03 DIAGNOSIS — I10 Essential (primary) hypertension: Secondary | ICD-10-CM | POA: Diagnosis not present

## 2016-07-03 NOTE — Progress Notes (Signed)
Patient ID: [name], female   DOB: 10-Jul-1941, 75 y.o.   MRN: [id number]    Cardiology Office Note    Date:  07/04/2016   Ana FillersD:  Ana FillersJudy C Foster, DOB 10-Jul-1941, MRN [id number]  PCP:  Pcp Not In System  Cardiologist:   Thurmon FairMihai Keri Veale, MD   Chief complaint: Deteriorating memory, falls  History of Present Illness:  [name] is a 75 y.o. female with a history of OSA, paroxysmal atrial tachycardia who presents for follow-up. She was unhappy with her initial assisted living location and is now in a new facility in [place]. She has problems with worsening memory him a palpitations, very frequent falls despite using a walker. An MRI of her head showed extensive white matter disease and suggested that she probably has vascular dementia, but she has not had documentation of atrial fibrillation or significant carotid disease..  Dr. Anne HahnWillis recommended that she taper off Lyrica and clonazepam, and recommended Aricept and clopidogrel. The medication record from her current assisted living facility still includes Lyrica, clorazepate as well as ropirinole. The patient wants to stop all these medications. Aricept and clopidogrel are not listed amongst her medications.  The patient's daughter wonders whether she actually took Aricept for a while but requested that it be stopped due to GI upset. Ana HongJudy, whose memory cannot always be relied upon, insists that she never took Aricept. Dr. Anne HahnWillis had recommended starting a dose of 5 mg, subsequently increasing it to 10 mg daily if well tolerated.  She has a history of paroxysmal atrial tachycardia up to about 180 bpm. palpitations were a big problem when she was receiving excessive levothyroxine supplementation, but has not had palpitations recently. Palpitations have never been prominent around the time of her falls. Cardiac workup showed normal left ventricular systolic function, normal left atrial size, no valvular abnormalities, no evidence of coronary  insufficiency on remote nuclear perfusion study.  Past Medical History:  Diagnosis Date  . Abnormal heart rhythm   . Abnormality of gait 12/21/2015  . Afib (HCC)    h/o  . Anxiety   . Arthritis    "hips, shoulders" (01/05/2015)  . Chronic kidney disease (CKD), stage III (moderate)    [name]/notes 01/05/2015  . Dementia   . Depression   . Echocardiogram abnormal 02/26/11   MVP,mild MR,AOV mildly sclerotic. EF >55%  . GERD (gastroesophageal reflux disease)   . Hypertension   . Hypothyroidism   . Memory difficulty 06/05/2016  . Osteoporosis   . Reactive airway disease   . Scoliosis   . Sleep apnea    "suppose to wear a mask; can't tolerate it" (01/05/2015)    Past Surgical History:  Procedure Laterality Date  . CATARACT EXTRACTION, BILATERAL Bilateral   . CESAREAN SECTION  1967  . CESAREAN SECTION  1971  . DILATION AND CURETTAGE OF UTERUS    . JOINT REPLACEMENT    . OPEN REDUCTION INTERNAL FIXATION (ORIF) DISTAL RADIAL FRACTURE Left 12/2009   [name]/notes 12/28/2009  . TOTAL KNEE ARTHROPLASTY Left 03/2010   [name]/notes 04/07/2010  . TUBAL LIGATION    . VAGINAL HYSTERECTOMY  1980    Outpatient Medications Prior to Visit  Medication Sig Dispense Refill  . baclofen (LIORESAL) 10 MG tablet Take 5 mg by mouth at bedtime as needed for muscle spasms.     . calcitRIOL (ROCALTROL) 0.25 MCG capsule Take 0.25 mcg by mouth. Mondays,Wednesdays, and Fridays    . clonazePAM (KLONOPIN) 2 MG tablet Take 1 tablet (2 mg total)  by mouth at bedtime. 30 tablet 5  . clopidogrel (PLAVIX) 75 MG tablet Take 1 tablet (75 mg total) by mouth daily. 30 tablet 5  . diltiazem (CARDIZEM CD) 180 MG 24 hr capsule Take 1 capsule (180 mg total) by mouth daily. 90 capsule 3  . diltiazem (CARDIZEM) 30 MG tablet Take 30 mg by mouth at bedtime as needed (palpitations).    . donepezil (ARICEPT) 5 MG tablet Take 1 tablet (5 mg total) by mouth at bedtime. 30 tablet 1  . levothyroxine (SYNTHROID, LEVOTHROID) 112 MCG tablet Take 112 mcg by  mouth daily before breakfast.    . metoprolol tartrate (LOPRESSOR) 25 MG tablet Take 12.5 mg by mouth 2 (two) times daily.     [name]. omega-3 acid ethyl esters (LOVAZA) 1 G capsule Take 1 g by mouth daily.      . ondansetron (ZOFRAN ODT) 4 MG disintegrating tablet Take 1 tablet (4 mg total) by mouth every 8 (eight) hours as needed for nausea. 4mg  ODT q4 hours prn nausea/vomit 30 tablet 0  . pantoprazole (PROTONIX) 40 MG tablet Take 40 mg by mouth daily.    . polyethylene glycol (MIRALAX / GLYCOLAX) packet Take 17 g by mouth every other day.    . potassium chloride SA (K-DUR,KLOR-CON) 20 MEQ tablet Take 20 mEq by mouth daily.      . pregabalin (LYRICA) 50 MG capsule Take 50 mg by mouth daily.    [name]. rOPINIRole (REQUIP) 0.5 MG tablet Take 0.5 mg by mouth at bedtime.    . sertraline (ZOLOFT) 100 MG tablet Take 150 mg by mouth daily.    . SYMBICORT 160-4.5 MCG/ACT inhaler Inhale 2 puffs into the lungs 2 (two) times daily.  3  . traMADol (ULTRAM) 50 MG tablet Take 50 mg by mouth 3 (three) times daily.     . traZODone (DESYREL) 50 MG tablet Take 50 mg by mouth at bedtime.    . Vitamin D, Ergocalciferol, (DRISDOL) 50000 UNITS CAPS capsule Take 50,000 Units by mouth every 7 (seven) days. Mondays     No facility-administered medications prior to visit.      Allergies:   Vicodin [hydrocodone-acetaminophen]   Social History   Social History  . Marital status: Divorced    Spouse name: N/A  . Number of children: 2  . Years of education: 12   Occupational History  . retired    Social History Main Topics  . Smoking status: Never Smoker  . Smokeless tobacco: Never Used  . Alcohol use No  . Drug use: No  . Sexual activity: No   Other Topics Concern  . None   Social History Narrative   Patient drinks caffeine twice a month.   Patient is right handed.      Family History:  The patient's family history includes Cancer in her father and mother; Heart disease in her father.   ROS:   Please see  the history of present illness.    ROS All other systems reviewed and are negative.   PHYSICAL EXAM:   VS:  BP 132/70   Pulse 72   Ht 5\' 3"  (1.6 m)   Wt 135 lb (61.2 kg)   BMI 23.91 kg/m    GEN: Well nourished, well developed, in no acute distress  HEENT: normal  Neck: no JVD, carotid bruits, or masses Cardiac: RRR; no murmurs, rubs, or gallops,no edema  Respiratory:  clear to auscultation bilaterally, normal work of breathing GI: soft, nontender, nondistended, + BS MS:  no deformity or atrophy  Skin: warm and dry, no rash Neuro:  Alert and Oriented x 3, Strength and sensation are intact Psych: euthymic mood, full affect  Wt Readings from Last 3 Encounters:  07/03/16 135 lb (61.2 kg)  06/05/16 137 lb (62.1 kg)  04/26/16 135 lb (61.2 kg)      Studies/Labs Reviewed:   EKG:  EKG is ordered today.  The ekg ordered today demonstrates NSR, normal QTc 423 ms  Recent Labs: 04/26/2016: BUN 23; Creatinine, Ser 2.03; Hemoglobin 12.5; Platelets 118; Potassium 4.3; Sodium 142   Lipid Panel    Component Value Date/Time   CHOL 196 09/15/2014 1224   TRIG 202 (H) 09/15/2014 1224   HDL 54 09/15/2014 1224   CHOLHDL 3.6 09/15/2014 1224   VLDL 40 09/15/2014 1224   LDLCALC 102 (H) 09/15/2014 1224    Additional studies/ records that were reviewed today include:  From Dr. Anne Hahn and emergency room visits, CT results    ASSESSMENT:    1. Paroxysmal atrial tachycardia (HCC)   2. Essential hypertension   3. Memory difficulty      PLAN:  In order of problems listed above:  1. PAT: Asymptomatic on diltiazem and metoprolol. Her blood pressure and heart rate are normal. The absence of dizziness preceding her fall suggests that these are not hemodynamically mediated. However it is not unreasonable to try to gradually wean down these medications to see if this helps with her falls. Event monitoring has shown no connection between falls and arrhythmia and has never shown atrial  fibrillation 2. HTN: Normal BP current medications 3. Vascular dementia: Have recommended that she start Aricept 5 mg daily and call Dr. Clarisa Kindred office after several weeks to discuss titrating the dose up. Call sooner if she develops side effects. Prescribed clopidogrel 75 mg daily. Discussed the fact that certain medications, especially Lyrica need to be gradually discontinued and that she should discuss this with her physician at the assisted living facility.    Medication Adjustments/Labs and Tests Ordered: Current medicines are reviewed at length with the patient today.  Concerns regarding medicines are outlined above.  Medication changes, Labs and Tests ordered today are listed in the Patient Instructions below. Patient Instructions  Dr Royann Shivers has recommended making the following medication changes: 1. STOP Potassium 2. TAKE Aricept 5 mg - take 1 tablet by mouth daily at night per Dr Anne Hahn 3. TAKE Clopidogrel 75 mg - take 1 tablet by mouth daily per Dr Royann Shivers  Dr Courvoisier Hamblen recommends that you schedule a follow-up appointment in 6 months. You will receive a reminder letter in the mail two months in advance. If you don't receive a letter, please call our office to schedule the follow-up appointment.  If you need a refill on your cardiac medications before your next appointment, please call your pharmacy.      Signed, Thurmon Fair, MD  07/04/2016 2:41 PM    [facility] [address] [place], [place], [place]  [address] Phone: [phone]; Fax: [phone]

## 2016-07-03 NOTE — Patient Instructions (Signed)
Dr Royann Shiversroitoru has recommended making the following medication changes: 1. STOP Potassium 2. TAKE Aricept 5 mg - take 1 tablet by mouth daily at night per Dr Anne HahnWillis 3. TAKE Clopidogrel 75 mg - take 1 tablet by mouth daily per Dr Royann Shiversroitoru  Dr Croitoru recommends that you schedule a follow-up appointment in 6 months. You will receive a reminder letter in the mail two months in advance. If you don't receive a letter, please call our office to schedule the follow-up appointment.  If you need a refill on your cardiac medications before your next appointment, please call your pharmacy.

## 2016-07-10 DIAGNOSIS — D509 Iron deficiency anemia, unspecified: Secondary | ICD-10-CM | POA: Diagnosis not present

## 2016-07-10 DIAGNOSIS — E039 Hypothyroidism, unspecified: Secondary | ICD-10-CM | POA: Diagnosis not present

## 2016-07-10 DIAGNOSIS — N2581 Secondary hyperparathyroidism of renal origin: Secondary | ICD-10-CM | POA: Diagnosis not present

## 2016-07-10 DIAGNOSIS — N183 Chronic kidney disease, stage 3 (moderate): Secondary | ICD-10-CM | POA: Diagnosis not present

## 2016-07-10 DIAGNOSIS — I129 Hypertensive chronic kidney disease with stage 1 through stage 4 chronic kidney disease, or unspecified chronic kidney disease: Secondary | ICD-10-CM | POA: Diagnosis not present

## 2016-07-18 ENCOUNTER — Other Ambulatory Visit: Payer: Self-pay

## 2016-07-18 ENCOUNTER — Ambulatory Visit
Admission: RE | Admit: 2016-07-18 | Discharge: 2016-07-18 | Disposition: A | Discharge status: Home / Self Care | Payer: Medicare Other | Source: Ambulatory Visit | Attending: Neurology | Admitting: Neurology

## 2016-07-18 DIAGNOSIS — R269 Unspecified abnormalities of gait and mobility: Secondary | ICD-10-CM

## 2016-07-18 DIAGNOSIS — R413 Other amnesia: Secondary | ICD-10-CM

## 2016-07-19 ENCOUNTER — Telehealth: Payer: Self-pay | Admitting: Neurology

## 2016-07-19 DIAGNOSIS — I10 Essential (primary) hypertension: Secondary | ICD-10-CM

## 2016-07-19 NOTE — Telephone Encounter (Signed)
  I called patient. I discussed the MRI results with her, the patient has fairly extensive small vessel changes, this likely is at least a part of the etiology of her memory problems, balance problems, and bladder control issues. The evidence of mesial temporal atrophy suggested there may be an Alzheimer's process as well. The patient will remain on Aricept. She indicates that she has no real primary care physician at this time, she was seen by Dr. Parke SimmersBland. I will make a referral to [facility].  MRI brain 07/18/16:  IMPRESSION:  Abnormal MRI brain (without) demonstrating: 1. Moderate-severe perisylvian and mesial temporal atrophy. 2. Moderate-severe periventricular and subcortical and pontine chronic small vessel ischemic disease. 3. No acute findings. 4. Compared to MRI on 12/19/10, there has been mild progression of atrophy and chronic small vessel ischemic disease.

## 2016-07-23 DIAGNOSIS — G2581 Restless legs syndrome: Secondary | ICD-10-CM | POA: Diagnosis not present

## 2016-07-23 DIAGNOSIS — G4700 Insomnia, unspecified: Secondary | ICD-10-CM | POA: Diagnosis not present

## 2016-07-23 DIAGNOSIS — N183 Chronic kidney disease, stage 3 (moderate): Secondary | ICD-10-CM | POA: Diagnosis not present

## 2016-07-23 DIAGNOSIS — M797 Fibromyalgia: Secondary | ICD-10-CM | POA: Diagnosis not present

## 2016-08-14 ENCOUNTER — Telehealth: Payer: Self-pay | Admitting: Cardiovascular Disease

## 2016-08-14 ENCOUNTER — Telehealth: Payer: Self-pay | Admitting: Neurology

## 2016-08-14 ENCOUNTER — Other Ambulatory Visit: Payer: Self-pay | Admitting: Neurology

## 2016-08-14 MED ORDER — DONEPEZIL HCL 10 MG PO TABS: 10.0000 mg | ORAL_TABLET | Freq: Every day | ORAL | 11 refills | Status: DC

## 2016-08-14 NOTE — Telephone Encounter (Signed)
New Message  Pt c/o medication issue:  1. Name of Medication: donepezil  2. How are you currently taking this medication (dosage and times per day)? 5 mg tablet once at bedtime  3. Are you having a reaction (difficulty breathing--STAT)? No  4. What is your medication issue? Pt [facility] [place] she's currently using that pharmacy.  Pt voiced for us to contact [facility] [address] [place], [place] [address], [phone]  Pt voiced she doesn't know the name of the pharmacy for [facility] [place]  Please f/u with pt

## 2016-08-14 NOTE — Telephone Encounter (Signed)
New order for increased dose of Aricept faxed to [facility] F # [phone].

## 2016-08-14 NOTE — Telephone Encounter (Signed)
Reviewed chart and discussed w patient. This med was actually recommended by Dr. Anne HahnWillis and I notified patient she would need to contact his office ([facility]) for refills. Offered to provide number to call, pt voiced she has number on-hand. She voiced understanding and will follow up accordingly. Aware to call us if we may be of help with any other needs.

## 2016-08-14 NOTE — Telephone Encounter (Signed)
Pt called sts she ahs done well on aricept 5mg  for the past month and ready to increase to 10mg . [name] [phone] and said to pls call the facility to get the phone number and fax as to where the med should be sent to facility.

## 2016-08-14 NOTE — Telephone Encounter (Addendum)
Spoke w/ pt's daughter per DPR. Let her know that new order was faxed to ALF w/ instructions for increased dose of Aricept to 10 mg. Reviewed MRI results w/ her as well "The patient has fairly extensive small vessel changes, this likely is at least a part of the etiology of her memory problems, balance problems, and bladder control issues. The evidence of mesial temporal atrophy suggested there may be an Alzheimer's process as well." Daughter verbalized understanding and appreciation for call.

## 2016-08-14 NOTE — Telephone Encounter (Signed)
Patient's daughter is calling. She needs to discuss the patient's MRI and also discuss the dosage of donepezil (ARICEPT) 10 MG tablet. [name] says [facility] in [place] says the dosage should be 5mg  for donepezil (ARICEPT) 10 MG tablet. Please call to discuss.

## 2016-08-21 DIAGNOSIS — J449 Chronic obstructive pulmonary disease, unspecified: Secondary | ICD-10-CM | POA: Diagnosis not present

## 2016-08-21 DIAGNOSIS — I10 Essential (primary) hypertension: Secondary | ICD-10-CM | POA: Diagnosis not present

## 2016-08-21 DIAGNOSIS — N183 Chronic kidney disease, stage 3 (moderate): Secondary | ICD-10-CM | POA: Diagnosis not present

## 2016-08-21 DIAGNOSIS — G4700 Insomnia, unspecified: Secondary | ICD-10-CM | POA: Diagnosis not present

## 2016-09-02 ENCOUNTER — Telehealth: Payer: Self-pay | Admitting: Internal Medicine

## 2016-09-02 DIAGNOSIS — G4733 Obstructive sleep apnea (adult) (pediatric): Secondary | ICD-10-CM

## 2016-09-02 NOTE — Telephone Encounter (Signed)
Spoke with pt. She is aware of CY's recommendations. Order has been placed. Nothing further was needed.

## 2016-09-02 NOTE — Telephone Encounter (Signed)
Ok to order DME [facility]   Replace CPAP machine 9 cwp, mask of choice, humidifier, supplies, AirView   Dx OSA

## 2016-09-02 NOTE — Telephone Encounter (Signed)
Patient states that she moved into a new assistant living home, she put her CPAP machine on shelf to hold it until she was moved into her permanent room, when she went to get the machine, it was not her machine on the shelf, it was someone else's machine.  She said that she wears a full face mask and the machine that was on the shelf had a nasal pillow mask attached to it.  She said that the machine was also very "filthy".  She is requesting a new machine.    Dr. Maple HudsonYoung, please advise.

## 2016-09-06 DIAGNOSIS — Z1231 Encounter for screening mammogram for malignant neoplasm of breast: Secondary | ICD-10-CM | POA: Diagnosis not present

## 2016-09-20 DIAGNOSIS — E038 Other specified hypothyroidism: Secondary | ICD-10-CM | POA: Diagnosis not present

## 2016-09-20 DIAGNOSIS — E119 Type 2 diabetes mellitus without complications: Secondary | ICD-10-CM | POA: Diagnosis not present

## 2016-09-20 DIAGNOSIS — Z79899 Other long term (current) drug therapy: Secondary | ICD-10-CM | POA: Diagnosis not present

## 2016-09-20 DIAGNOSIS — E559 Vitamin D deficiency, unspecified: Secondary | ICD-10-CM | POA: Diagnosis not present

## 2016-09-20 DIAGNOSIS — D518 Other vitamin B12 deficiency anemias: Secondary | ICD-10-CM | POA: Diagnosis not present

## 2016-09-20 DIAGNOSIS — E782 Mixed hyperlipidemia: Secondary | ICD-10-CM | POA: Diagnosis not present

## 2016-09-24 DIAGNOSIS — G894 Chronic pain syndrome: Secondary | ICD-10-CM | POA: Diagnosis not present

## 2016-09-24 DIAGNOSIS — K219 Gastro-esophageal reflux disease without esophagitis: Secondary | ICD-10-CM | POA: Diagnosis not present

## 2016-09-24 DIAGNOSIS — N183 Chronic kidney disease, stage 3 (moderate): Secondary | ICD-10-CM | POA: Diagnosis not present

## 2016-09-24 DIAGNOSIS — I10 Essential (primary) hypertension: Secondary | ICD-10-CM | POA: Diagnosis not present

## 2016-09-27 DIAGNOSIS — R197 Diarrhea, unspecified: Secondary | ICD-10-CM | POA: Diagnosis not present

## 2016-09-27 DIAGNOSIS — G4489 Other headache syndrome: Secondary | ICD-10-CM | POA: Diagnosis not present

## 2016-09-27 DIAGNOSIS — N201 Calculus of ureter: Secondary | ICD-10-CM | POA: Diagnosis not present

## 2016-09-27 DIAGNOSIS — R1084 Generalized abdominal pain: Secondary | ICD-10-CM | POA: Diagnosis not present

## 2016-09-27 DIAGNOSIS — N211 Calculus in urethra: Secondary | ICD-10-CM | POA: Diagnosis not present

## 2016-09-27 DIAGNOSIS — R112 Nausea with vomiting, unspecified: Secondary | ICD-10-CM | POA: Diagnosis not present

## 2016-09-30 ENCOUNTER — Encounter: Payer: Self-pay | Admitting: Internal Medicine

## 2016-09-30 ENCOUNTER — Ambulatory Visit (INDEPENDENT_AMBULATORY_CARE_PROVIDER_SITE_OTHER): Payer: Medicare Other | Admitting: Internal Medicine

## 2016-09-30 VITALS — BP 100/60 | HR 65 | Ht 63.0 in | Wt 127.0 lb

## 2016-09-30 DIAGNOSIS — G4700 Insomnia, unspecified: Secondary | ICD-10-CM | POA: Insufficient documentation

## 2016-09-30 DIAGNOSIS — G4733 Obstructive sleep apnea (adult) (pediatric): Secondary | ICD-10-CM | POA: Diagnosis not present

## 2016-09-30 DIAGNOSIS — F5101 Primary insomnia: Secondary | ICD-10-CM | POA: Diagnosis not present

## 2016-09-30 MED ORDER — CLONAZEPAM 2 MG PO TABS: 2.0000 mg | ORAL_TABLET | Freq: Every day | ORAL | 5 refills | Status: DC

## 2016-09-30 MED ORDER — TRAZODONE HCL 100 MG PO TABS: 100.0000 mg | ORAL_TABLET | Freq: Every day | ORAL | 12 refills | Status: DC

## 2016-09-30 NOTE — Patient Instructions (Addendum)
Order- schedule unattended Home Sleep Test       Dx OSA  We will await results of your sleep study to see if you qualify for a new CPAP machine by Medicare rules  Refill scripts printed for sleep medicines: Trazodone and clonazepam  Please call as needed

## 2016-09-30 NOTE — Assessment & Plan Note (Signed)
Chronic insomnia. Education on appropriate sleep hygiene has been done. She has felt well managed with combination of trazodone and clonazepam. These medications can be continued.

## 2016-09-30 NOTE — Assessment & Plan Note (Signed)
She has lost rolled CPAP machine. Original diagnostic study was in 2006. She will need updated documentation replacement machine and hopefully we can get it done as a home study. Plan-schedule unattended home sleep test then order replacement CPAP if appropriate.

## 2016-09-30 NOTE — Progress Notes (Signed)
Patient ID: Ana FillersJudy C Foster, female    DOB: August 22, 1941, 75 y.o.   MRN: [id number] HPI  female never smoker followed for OSA, insomnia, restless legs, complicated by PAT, stage III kidney disease, HBP.    10/06/15- 75 year old female never smoker followed for obstructive sleep apnea, insomnia, restless legs, complicated by PAT, stage III kidney disease, HBP CPAP 9/[facility] FOLLOWS FOR:[facility] resides at [facility](moved 1-2 weeks ago); DME is [employer] for now-patient is interested in nasal pillows mask. Wears CPAP most everry night Has moved to assisted living after repeated falls and so far is very happy there. Continues Requip for restless legs and clonazepam for insomnia. Continues CPAP. She would like to change to nasal pillows mask. Describes compliance is good.  09/30/2016-75 year old female never smoker followed for OSA, insomnia, restless legs, complicated by PAT, stage III kidney disease, HBP CPAP 9/[employer]- no longer using at long- term adult care home in [place] NPSG 09/18/05- AHI 19.8/ hr FOLLOWS FOR: Pt no longer using CPAP since being in assisted living-pt needs assistance in getting her CPAP back ([facility] is used as DME at [facility] [place]  [facility].  Transport aide from her senior living center is here today. Lost her CPAP machine somewhere moving to [place] current home in [place] 3 months ago. She is not breathing or sleeping as well without CPAP. Continues trazodone and clonazepam for sleep. Insomnia pattern adequately controlled with this.  Review of Systems-see HPI Constitutional:   No-   weight loss, night sweats, fevers, chills, fatigue, lassitude. HEENT:   + headaches, No-difficulty swallowing, tooth/dental problems, sore throat,       No-  sneezing, itching, ear ache, nasal congestion, post nasal drip,  CV:  No-   chest pain, orthopnea, PND, swelling in lower extremities, anasarca, Dizziness,+ palpitations Resp: No-    shortness of breath with exertion or at rest.              No-   productive cough,  No non-productive cough,  No-  coughing up of blood.              No-   change in color of mucus.  No- wheezing.   Skin: No-   rash or lesions. GI:  No-   heartburn, indigestion, abdominal pain, nausea, vomiting,  GU:  MS:  +  joint pain or swelling.  Neuro- as per HPI  Psych:  No- change in mood or affect. + depression and anxiety.  No memory loss.  Objective:   Physical Exam General- Alert, Oriented, Affect-appropriate, oriented and calm,  Distress- none acute. Medium build Skin- rash-none, lesions- none, excoriation- none Lymphadenopathy- none Head- atraumatic            Eyes- Gross vision intact, PERRLA, conjunctivae clear secretions            Ears- Hearing, canals-normal            Nose- Clear, no-Septal dev, mucus, polyps, erosion, perforation             Throat- Mallampati IV , mucosa clear , drainage- none, tonsils- atrophic, dentures Neck- flexible , trachea midline, no stridor , thyroid nl, carotid no bruit Chest - symmetrical excursion , unlabored           Heart/CV- RRR , no murmur , no gallop  , no rub, nl s1 s2                           -  JVD- none , edema- none, stasis changes- none, varices- none           Lung- clear to P&A, wheeze- none, cough -None , dullness-none, rub- none           Chest wall-  Abd- Br/ Gen/ Rectal- Not done, not indicated Extrem- cyanosis- none, clubbing, none, atrophy- none, strength- nl. +Cane Neuro- grossly intact to observation

## 2016-10-01 DIAGNOSIS — R197 Diarrhea, unspecified: Secondary | ICD-10-CM | POA: Diagnosis not present

## 2016-10-01 DIAGNOSIS — N201 Calculus of ureter: Secondary | ICD-10-CM | POA: Diagnosis not present

## 2016-10-01 DIAGNOSIS — M62838 Other muscle spasm: Secondary | ICD-10-CM | POA: Diagnosis not present

## 2016-10-01 DIAGNOSIS — R110 Nausea: Secondary | ICD-10-CM | POA: Diagnosis not present

## 2016-10-07 ENCOUNTER — Ambulatory Visit: Payer: Self-pay | Admitting: Internal Medicine

## 2016-10-22 DIAGNOSIS — K529 Noninfective gastroenteritis and colitis, unspecified: Secondary | ICD-10-CM | POA: Diagnosis not present

## 2016-10-22 DIAGNOSIS — G894 Chronic pain syndrome: Secondary | ICD-10-CM | POA: Diagnosis not present

## 2016-10-22 DIAGNOSIS — I10 Essential (primary) hypertension: Secondary | ICD-10-CM | POA: Diagnosis not present

## 2016-10-22 DIAGNOSIS — M797 Fibromyalgia: Secondary | ICD-10-CM | POA: Diagnosis not present

## 2016-10-30 DIAGNOSIS — G4733 Obstructive sleep apnea (adult) (pediatric): Secondary | ICD-10-CM | POA: Diagnosis not present

## 2016-11-12 DIAGNOSIS — G4733 Obstructive sleep apnea (adult) (pediatric): Secondary | ICD-10-CM | POA: Diagnosis not present

## 2016-11-14 ENCOUNTER — Other Ambulatory Visit: Payer: Self-pay | Admitting: *Deleted

## 2016-11-14 DIAGNOSIS — G4733 Obstructive sleep apnea (adult) (pediatric): Secondary | ICD-10-CM

## 2016-11-18 ENCOUNTER — Inpatient Hospital Stay (HOSPITAL_COMMUNITY)
Admission: EM | Admit: 2016-11-18 | Discharge: 2016-11-26 | DRG: 392 | Disposition: A | Discharge status: Home Health | Payer: Medicare Other | Attending: Family Medicine | Admitting: Family Medicine

## 2016-11-18 ENCOUNTER — Encounter (HOSPITAL_COMMUNITY): Payer: Self-pay | Admitting: *Deleted

## 2016-11-18 ENCOUNTER — Emergency Department (HOSPITAL_COMMUNITY): Payer: Medicare Other

## 2016-11-18 DIAGNOSIS — R748 Abnormal levels of other serum enzymes: Secondary | ICD-10-CM

## 2016-11-18 DIAGNOSIS — Z90710 Acquired absence of both cervix and uterus: Secondary | ICD-10-CM

## 2016-11-18 DIAGNOSIS — D696 Thrombocytopenia, unspecified: Secondary | ICD-10-CM | POA: Diagnosis not present

## 2016-11-18 DIAGNOSIS — R1084 Generalized abdominal pain: Secondary | ICD-10-CM | POA: Diagnosis not present

## 2016-11-18 DIAGNOSIS — G2581 Restless legs syndrome: Secondary | ICD-10-CM | POA: Diagnosis not present

## 2016-11-18 DIAGNOSIS — N183 Chronic kidney disease, stage 3 unspecified: Secondary | ICD-10-CM | POA: Diagnosis present

## 2016-11-18 DIAGNOSIS — I480 Paroxysmal atrial fibrillation: Secondary | ICD-10-CM | POA: Diagnosis not present

## 2016-11-18 DIAGNOSIS — I129 Hypertensive chronic kidney disease with stage 1 through stage 4 chronic kidney disease, or unspecified chronic kidney disease: Secondary | ICD-10-CM | POA: Diagnosis present

## 2016-11-18 DIAGNOSIS — K582 Mixed irritable bowel syndrome: Principal | ICD-10-CM | POA: Diagnosis present

## 2016-11-18 DIAGNOSIS — R197 Diarrhea, unspecified: Secondary | ICD-10-CM | POA: Diagnosis not present

## 2016-11-18 DIAGNOSIS — R1110 Vomiting, unspecified: Secondary | ICD-10-CM | POA: Diagnosis not present

## 2016-11-18 DIAGNOSIS — N289 Disorder of kidney and ureter, unspecified: Secondary | ICD-10-CM

## 2016-11-18 DIAGNOSIS — Z66 Do not resuscitate: Secondary | ICD-10-CM | POA: Diagnosis not present

## 2016-11-18 DIAGNOSIS — E860 Dehydration: Secondary | ICD-10-CM

## 2016-11-18 DIAGNOSIS — R194 Change in bowel habit: Secondary | ICD-10-CM | POA: Diagnosis not present

## 2016-11-18 DIAGNOSIS — E46 Unspecified protein-calorie malnutrition: Secondary | ICD-10-CM | POA: Diagnosis not present

## 2016-11-18 DIAGNOSIS — R1011 Right upper quadrant pain: Secondary | ICD-10-CM | POA: Diagnosis not present

## 2016-11-18 DIAGNOSIS — G4700 Insomnia, unspecified: Secondary | ICD-10-CM | POA: Diagnosis not present

## 2016-11-18 DIAGNOSIS — I10 Essential (primary) hypertension: Secondary | ICD-10-CM | POA: Diagnosis present

## 2016-11-18 DIAGNOSIS — K219 Gastro-esophageal reflux disease without esophagitis: Secondary | ICD-10-CM | POA: Diagnosis not present

## 2016-11-18 DIAGNOSIS — R112 Nausea with vomiting, unspecified: Secondary | ICD-10-CM | POA: Diagnosis not present

## 2016-11-18 DIAGNOSIS — E034 Atrophy of thyroid (acquired): Secondary | ICD-10-CM

## 2016-11-18 DIAGNOSIS — B356 Tinea cruris: Secondary | ICD-10-CM | POA: Diagnosis present

## 2016-11-18 DIAGNOSIS — F0390 Unspecified dementia without behavioral disturbance: Secondary | ICD-10-CM | POA: Diagnosis present

## 2016-11-18 DIAGNOSIS — N179 Acute kidney failure, unspecified: Secondary | ICD-10-CM

## 2016-11-18 DIAGNOSIS — R011 Cardiac murmur, unspecified: Secondary | ICD-10-CM | POA: Diagnosis not present

## 2016-11-18 DIAGNOSIS — E875 Hyperkalemia: Secondary | ICD-10-CM | POA: Diagnosis present

## 2016-11-18 DIAGNOSIS — I471 Supraventricular tachycardia: Secondary | ICD-10-CM | POA: Diagnosis not present

## 2016-11-18 DIAGNOSIS — R109 Unspecified abdominal pain: Secondary | ICD-10-CM | POA: Diagnosis not present

## 2016-11-18 DIAGNOSIS — R933 Abnormal findings on diagnostic imaging of other parts of digestive tract: Secondary | ICD-10-CM | POA: Diagnosis not present

## 2016-11-18 DIAGNOSIS — E876 Hypokalemia: Secondary | ICD-10-CM | POA: Diagnosis not present

## 2016-11-18 DIAGNOSIS — I6782 Cerebral ischemia: Secondary | ICD-10-CM | POA: Diagnosis present

## 2016-11-18 DIAGNOSIS — N39490 Overflow incontinence: Secondary | ICD-10-CM | POA: Diagnosis present

## 2016-11-18 DIAGNOSIS — K529 Noninfective gastroenteritis and colitis, unspecified: Secondary | ICD-10-CM

## 2016-11-18 DIAGNOSIS — G4733 Obstructive sleep apnea (adult) (pediatric): Secondary | ICD-10-CM | POA: Diagnosis not present

## 2016-11-18 DIAGNOSIS — Z6824 Body mass index (BMI) 24.0-24.9, adult: Secondary | ICD-10-CM

## 2016-11-18 DIAGNOSIS — Z96652 Presence of left artificial knee joint: Secondary | ICD-10-CM | POA: Diagnosis present

## 2016-11-18 DIAGNOSIS — E039 Hypothyroidism, unspecified: Secondary | ICD-10-CM | POA: Diagnosis not present

## 2016-11-18 DIAGNOSIS — G43A1 Cyclical vomiting, intractable: Secondary | ICD-10-CM | POA: Diagnosis not present

## 2016-11-18 DIAGNOSIS — R110 Nausea: Secondary | ICD-10-CM | POA: Diagnosis not present

## 2016-11-18 DIAGNOSIS — K5904 Chronic idiopathic constipation: Secondary | ICD-10-CM | POA: Diagnosis not present

## 2016-11-18 HISTORY — DX: Nausea with vomiting, unspecified: R11.2

## 2016-11-18 LAB — CBC WITH DIFFERENTIAL/PLATELET
Basophils Absolute: 0 10*3/uL (ref 0.0–0.1)
Basophils Relative: 0 %
EOS ABS: 0 10*3/uL (ref 0.0–0.7)
Eosinophils Relative: 0 %
HEMATOCRIT: 45.4 % (ref 36.0–46.0)
HEMOGLOBIN: 15.4 g/dL — AB (ref 12.0–15.0)
LYMPHS ABS: 1.7 10*3/uL (ref 0.7–4.0)
LYMPHS PCT: 25 %
MCH: 31.3 pg (ref 26.0–34.0)
MCHC: 33.9 g/dL (ref 30.0–36.0)
MCV: 92.3 fL (ref 78.0–100.0)
MONOS PCT: 9 %
Monocytes Absolute: 0.6 10*3/uL (ref 0.1–1.0)
NEUTROS ABS: 4.5 10*3/uL (ref 1.7–7.7)
NEUTROS PCT: 66 %
Platelets: 171 10*3/uL (ref 150–400)
RBC: 4.92 MIL/uL (ref 3.87–5.11)
RDW: 13.9 % (ref 11.5–15.5)
WBC: 6.7 10*3/uL (ref 4.0–10.5)

## 2016-11-18 LAB — COMPREHENSIVE METABOLIC PANEL
ALT: 20 U/L (ref 14–54)
ANION GAP: 19 — AB (ref 5–15)
AST: 25 U/L (ref 15–41)
Albumin: 3.7 g/dL (ref 3.5–5.0)
Alkaline Phosphatase: 59 U/L (ref 38–126)
BUN: 23 mg/dL — ABNORMAL HIGH (ref 6–20)
CHLORIDE: 97 mmol/L — AB (ref 101–111)
CO2: 24 mmol/L (ref 22–32)
CREATININE: 2.21 mg/dL — AB (ref 0.44–1.00)
Calcium: 10 mg/dL (ref 8.9–10.3)
GFR calc Af Amer: 24 mL/min — ABNORMAL LOW (ref >60)
GFR, EST NON AFRICAN AMERICAN: 21 mL/min — AB (ref >60)
Glucose, Bld: 77 mg/dL (ref 65–99)
POTASSIUM: 3.8 mmol/L (ref 3.5–5.1)
SODIUM: 140 mmol/L (ref 135–145)
Total Bilirubin: 1.3 mg/dL — ABNORMAL HIGH (ref 0.3–1.2)
Total Protein: 7.3 g/dL (ref 6.5–8.1)

## 2016-11-18 LAB — MRSA PCR SCREENING: MRSA by PCR: NEGATIVE

## 2016-11-18 LAB — CBG MONITORING, ED: GLUCOSE-CAPILLARY: 78 mg/dL (ref 65–99)

## 2016-11-18 LAB — I-STAT CG4 LACTIC ACID, ED: LACTIC ACID, VENOUS: 0.63 mmol/L (ref 0.5–1.9)

## 2016-11-18 LAB — LIPASE, BLOOD: LIPASE: 66 U/L — AB (ref 11–51)

## 2016-11-18 MED ORDER — DILTIAZEM HCL ER COATED BEADS 180 MG PO CP24: 180.0000 mg | ORAL_CAPSULE | Freq: Every day | ORAL | Status: DC

## 2016-11-18 MED ORDER — SERTRALINE HCL 50 MG PO TABS
150.0000 mg | ORAL_TABLET | Freq: Every day | ORAL | Status: DC
  Administered 2016-11-19 – 2016-11-26 (×8): 150 mg via ORAL
  Filled 2016-11-18 (×8): qty 1

## 2016-11-18 MED ORDER — DOCUSATE SODIUM 100 MG PO CAPS
100.0000 mg | ORAL_CAPSULE | Freq: Two times a day (BID) | ORAL | Status: DC
  Administered 2016-11-19: 100 mg via ORAL
  Filled 2016-11-18: qty 1

## 2016-11-18 MED ORDER — PANTOPRAZOLE SODIUM 40 MG PO TBEC
40.0000 mg | DELAYED_RELEASE_TABLET | Freq: Every day | ORAL | Status: DC
  Administered 2016-11-19 – 2016-11-26 (×8): 40 mg via ORAL
  Filled 2016-11-18 (×8): qty 1

## 2016-11-18 MED ORDER — ONDANSETRON HCL 4 MG PO TABS
4.0000 mg | ORAL_TABLET | Freq: Four times a day (QID) | ORAL | Status: DC | PRN
  Administered 2016-11-19: 4 mg via ORAL
  Filled 2016-11-18: qty 1

## 2016-11-18 MED ORDER — FLEET ENEMA 7-19 GM/118ML RE ENEM
1.0000 | ENEMA | Freq: Once | RECTAL | Status: DC | PRN
Start: 2016-11-18 — End: 2016-11-19

## 2016-11-18 MED ORDER — TRAMADOL HCL 50 MG PO TABS
50.0000 mg | ORAL_TABLET | Freq: Three times a day (TID) | ORAL | Status: DC | PRN
  Administered 2016-11-18 – 2016-11-21 (×6): 50 mg via ORAL
  Filled 2016-11-18 (×6): qty 1

## 2016-11-18 MED ORDER — BISACODYL 5 MG PO TBEC: 5.0000 mg | DELAYED_RELEASE_TABLET | Freq: Every day | ORAL | Status: DC | PRN

## 2016-11-18 MED ORDER — ONDANSETRON HCL 4 MG/2ML IJ SOLN
4.0000 mg | Freq: Once | INTRAMUSCULAR | Status: AC
  Administered 2016-11-18: 4 mg via INTRAVENOUS
  Filled 2016-11-18: qty 2

## 2016-11-18 MED ORDER — METOPROLOL TARTRATE 12.5 MG HALF TABLET
12.5000 mg | ORAL_TABLET | Freq: Two times a day (BID) | ORAL | Status: DC
  Administered 2016-11-18 – 2016-11-26 (×16): 12.5 mg via ORAL
  Filled 2016-11-18 (×16): qty 1

## 2016-11-18 MED ORDER — SODIUM CHLORIDE 0.9 % IV BOLUS (SEPSIS)
1000.0000 mL | Freq: Once | INTRAVENOUS | Status: AC
  Administered 2016-11-18: 1000 mL via INTRAVENOUS

## 2016-11-18 MED ORDER — POLYETHYLENE GLYCOL 3350 17 G PO PACK: 17.0000 g | PACK | Freq: Every day | ORAL | Status: DC | PRN

## 2016-11-18 MED ORDER — ONDANSETRON HCL 4 MG/2ML IJ SOLN
4.0000 mg | Freq: Four times a day (QID) | INTRAMUSCULAR | Status: DC | PRN
  Administered 2016-11-18 – 2016-11-22 (×2): 4 mg via INTRAVENOUS
  Filled 2016-11-18 (×2): qty 2

## 2016-11-18 MED ORDER — LEVOTHYROXINE SODIUM 112 MCG PO TABS
112.0000 ug | ORAL_TABLET | Freq: Every day | ORAL | Status: DC
  Administered 2016-11-19 – 2016-11-26 (×8): 112 ug via ORAL
  Filled 2016-11-18 (×8): qty 1

## 2016-11-18 MED ORDER — ACETAMINOPHEN 325 MG PO TABS
650.0000 mg | ORAL_TABLET | Freq: Four times a day (QID) | ORAL | Status: DC | PRN
  Administered 2016-11-18 – 2016-11-19 (×2): 650 mg via ORAL
  Filled 2016-11-18 (×2): qty 2

## 2016-11-18 MED ORDER — BACLOFEN 10 MG PO TABS
5.0000 mg | ORAL_TABLET | Freq: Every evening | ORAL | Status: DC | PRN
  Administered 2016-11-19: 5 mg via ORAL
  Filled 2016-11-18: qty 1

## 2016-11-18 MED ORDER — SODIUM CHLORIDE 0.9 % IV SOLN
INTRAVENOUS | Status: AC
  Administered 2016-11-18 – 2016-11-19 (×2): via INTRAVENOUS

## 2016-11-18 MED ORDER — HEPARIN SODIUM (PORCINE) 5000 UNIT/ML IJ SOLN
5000.0000 [IU] | Freq: Three times a day (TID) | INTRAMUSCULAR | Status: DC
  Administered 2016-11-18 – 2016-11-26 (×23): 5000 [IU] via SUBCUTANEOUS
  Filled 2016-11-18 (×24): qty 1

## 2016-11-18 MED ORDER — CLOPIDOGREL BISULFATE 75 MG PO TABS
75.0000 mg | ORAL_TABLET | Freq: Every day | ORAL | Status: DC
  Administered 2016-11-19 – 2016-11-26 (×8): 75 mg via ORAL
  Filled 2016-11-18 (×8): qty 1

## 2016-11-18 MED ORDER — CALCITRIOL 0.25 MCG PO CAPS
0.2500 ug | ORAL_CAPSULE | ORAL | Status: DC
  Administered 2016-11-20 – 2016-11-25 (×3): 0.25 ug via ORAL
  Filled 2016-11-18 (×3): qty 1

## 2016-11-18 MED ORDER — CLONAZEPAM 1 MG PO TABS
1.0000 mg | ORAL_TABLET | Freq: Every day | ORAL | Status: DC
  Administered 2016-11-18 – 2016-11-25 (×8): 1 mg via ORAL
  Filled 2016-11-18 (×8): qty 1

## 2016-11-18 MED ORDER — DILTIAZEM HCL 60 MG PO TABS: 30.0000 mg | ORAL_TABLET | Freq: Every evening | ORAL | Status: DC | PRN

## 2016-11-18 MED ORDER — TRAZODONE HCL 100 MG PO TABS
100.0000 mg | ORAL_TABLET | Freq: Every day | ORAL | Status: DC
  Administered 2016-11-18 – 2016-11-25 (×8): 100 mg via ORAL
  Filled 2016-11-18 (×8): qty 1

## 2016-11-18 NOTE — ED Notes (Signed)
Daughter at bedside.

## 2016-11-18 NOTE — ED Notes (Signed)
Admitting at bedside 

## 2016-11-18 NOTE — H&P (Signed)
[facility] Admission History and Physical Service Pager: [phone]  Patient name: [name] Medical record number: [id number] Date of birth: [birth year] Age: 76 y.o. Gender: female  Primary Care Provider: Pcp Not In System Consultants: none Code Status: DNR (obtained on admission with daughter at the bedside)  Chief Complaint: Headache and weakness  Assessment and Plan: [name] is a 76 y.o. female presenting with headache and weakness after 4 days of emesis, now with headache and weakness. PMH is significant for OSA, HTN, CKD-3, PAF, hypothyroidism, malnutrition and restless leg syndrome  Headache and weakness: for one day. Likely from dehydration. Patient has nausea vomiting and diarrhea for 4 days. She denies focal neurologic symptoms. Neuro exam is also within normal limits except for global weakness. Low suspicion for meningitis without leukocytosis, fever or meningeal symptoms. Patient status post 2 L of IV fluid in the ER.  -Admit to MedSurg. Attending Dr. Lloyd Foster -Continue maintenance IV fluid at 100 mL per hour -Low threshold for head CT if focal neurologic sign or symptoms  Dehydration: Patient with nausea/vomiting for 4 days. Abdominal exam unremarkable except for epigastric tenderness. Lipase mildly elevated to 66. CT abdomen with wall thickening of descending colon versus colitis. Also with some hepatic steatosis. Lactic acid negative. Low suspicion for acute abdomen -Rehydration as above -Zofran when necessary -Protonix 40 mg daily  Chronic small vessel ischemic disease: MRI brain on 07/18/2016 with moderate-severe perisylvian and mesial temporal atrophy and Moderate-severe periventricular and subcortical and pontine chronic small vessel ischemic disease. He has been on Plavix. -We will continue home Plavix for now  PAF/Heart Murmur: RRR now. She has 3/6 SEM over sternal borders left>right.  [name] score 3 (HTN, age and sex). Rate  controlled by Diltiazem CD 180 mg +30 mg when necessary. Also on metoprolol 12.5 mg twice a day. Doesn't appear to be on anticoagulation.  -We will continue home metoprolol -Hold home Diltiazem as in for now -EKG -Telemetry -Echo for cardiac murmur -Cardiology consult in the morning -Discussed anticoagulation  Hypertension: Normotensive on admission -hold  hypertensive medication in the setting of dehydration  OSA: Doesn't use CPAP at ALF -Nightly CPAP  AoCKD-3: Likely prerenal in the setting of dehydration. Serum creatinine 2.2 on admission. Baseline about 2.0. -IV fluid as above -Hold nephrotoxic medications  Hypothyroidism: On Synthroid 112 MCG at home -Continue on Synthroid -Repeat TSH  Protein calorie malnutrition: Frail-looking patient -Consider nutrition consult  Insomnia: On clonazepam 2 mg and trazodone 100 mg at bedtime -We will order clonazepam 1 mg at bedtime -Trazodone 100 mg at bedtime  ? Dementia: Appears to be on Aricept 10 mg at home -hold home Aricept  FEN/GI:  -Nothing by mouth except sips with meds  -And nice at 100 mL per hour  -Replete electrolytes as needed  -Protonix 40 mg daily   Prophylaxis:  -Lovenox  Disposition: Patient admitted with dehydration from ALF. She will go back to ALF pending medical improvement  History of Present Illness:  [name] is a 76 y.o. female presenting with nausea and vomiting for 4 days now with headache and generalized weakness.   Patient from assisted living facility who presented with nausea, vomiting and diarrhea for 4 days. Today she developed headache and weakness that prompted her to come to ED by EMS. She also endorses some epigastric pain. She denies vision changes, focal weakness or numbness, speech changes,  fever, chills, chest pain, shortness of breath, diarrhea, dysuria or increased frequency of urination.  ED course: Vital signs normal. CMP remarkable for creatinine to 2.2,  bicarbonate 24,  chloride 97, and anion gap of 19. Lactic acid normal. CBC normal. Lipase mildly elevated to 66. CT abdomen with wall thickening of descending colon versus colitis. Also with some hepatic steatosis.  ROS A 12 point review of systems negative except for those mentioned in history of present illness  Patient Active Problem List   Diagnosis Date Noted  . Dehydration 11/18/2016  . Insomnia 09/30/2016  . Memory difficulty 06/05/2016  . Abnormality of gait 12/21/2015  . Cytopenia 02/09/2015  . Protein-calorie malnutrition, severe (HCC) 01/07/2015  . Vomiting and diarrhea 01/05/2015  . Sleep walking 10/08/2014  . Restless legs 10/08/2014  . Essential hypertension 09/16/2014  . Chronic kidney disease, stage 3 09/16/2014  . Paroxysmal atrial tachycardia (HCC) 09/16/2014  . Hypothyroidism 09/16/2014  . Obstructive sleep apnea 07/18/2011    Past Medical History: Past Medical History:  Diagnosis Date  . Abnormal heart rhythm   . Abnormality of gait 12/21/2015  . Afib (HCC)    h/o  . Anxiety   . Arthritis    "hips, shoulders" (01/05/2015)  . Chronic kidney disease (CKD), stage III (moderate)    [name]/notes 01/05/2015  . Dementia   . Depression   . Echocardiogram abnormal 02/26/11   MVP,mild MR,AOV mildly sclerotic. EF >55%  . GERD (gastroesophageal reflux disease)   . Hypertension   . Hypothyroidism   . Memory difficulty 06/05/2016  . Osteoporosis   . Reactive airway disease   . Scoliosis   . Sleep apnea    "suppose to wear a mask; can't tolerate it" (01/05/2015)    Past Surgical History: Past Surgical History:  Procedure Laterality Date  . CATARACT EXTRACTION, BILATERAL Bilateral   . CESAREAN SECTION  1967  . CESAREAN SECTION  1971  . DILATION AND CURETTAGE OF UTERUS    . JOINT REPLACEMENT    . OPEN REDUCTION INTERNAL FIXATION (ORIF) DISTAL RADIAL FRACTURE Left 12/2009   [name]/notes 12/28/2009  . TOTAL KNEE ARTHROPLASTY Left 03/2010   [name]/notes 04/07/2010  . TUBAL LIGATION    . VAGINAL  HYSTERECTOMY  1980    Social History: Social History  Substance Use Topics  . Smoking status: Never Smoker  . Smokeless tobacco: Never Used  . Alcohol use No    Family History: Family History  Problem Relation Age of Onset  . Cancer Mother   . Heart disease Father   . Cancer Father     Allergies and Medications: Allergies  Allergen Reactions  . Vicodin [Hydrocodone-Acetaminophen] Nausea And Vomiting   No current facility-administered medications on file prior to encounter.    Current Outpatient Prescriptions on File Prior to Encounter  Medication Sig Dispense Refill  . baclofen (LIORESAL) 10 MG tablet Take 5 mg by mouth at bedtime as needed (back spasms).     . calcitRIOL (ROCALTROL) 0.25 MCG capsule Take 0.25 mcg by mouth every Monday, Wednesday, and Friday. Do not crush    . clonazePAM (KLONOPIN) 2 MG tablet Take 1 tablet (2 mg total) by mouth at bedtime. 30 tablet 5  . clopidogrel (PLAVIX) 75 MG tablet Take 1 tablet (75 mg total) by mouth daily. 30 tablet 5  . diltiazem (CARDIZEM CD) 180 MG 24 hr capsule Take 1 capsule (180 mg total) by mouth daily. (Patient taking differently: Take 180 mg by mouth daily. Do not crush) 90 capsule 3  . diltiazem (CARDIZEM) 30 MG tablet Take 30 mg by mouth at bedtime as  needed (palpitations or tachycardia). Do not crush    . donepezil (ARICEPT) 10 MG tablet Take 1 tablet (10 mg total) by mouth at bedtime. 30 tablet 11  . levothyroxine (SYNTHROID, LEVOTHROID) 112 MCG tablet Take 112 mcg by mouth daily.     . metoprolol tartrate (LOPRESSOR) 25 MG tablet Take 12.5 mg by mouth 2 (two) times daily.     . pantoprazole (PROTONIX) 40 MG tablet Take 40 mg by mouth daily. Do not crush    . polyethylene glycol (MIRALAX / GLYCOLAX) packet Take 17 g by mouth daily as needed (constipation). Mix in 8 oz of water and drink    . sertraline (ZOLOFT) 100 MG tablet Take 150 mg by mouth daily.    . traMADol (ULTRAM) 50 MG tablet Take 50 mg by mouth See admin  instructions. Take 1 tablet (50 mg) by mouth 3 times daily, may also take 1 tablet every 8 hours as needed for pain    . traZODone (DESYREL) 100 MG tablet Take 1 tablet (100 mg total) by mouth at bedtime. 30 tablet 12    Objective: BP 140/60   Pulse 68   Temp 98.4 F (36.9 C) (Oral)   Resp 18   Ht 5\' 2"  (1.575 m)   Wt 61.2 kg (135 lb)   SpO2 96%   BMI 24.69 kg/m  Exam: GEN: appears frail and weak, able to sit in bed for exam Head: normocephalic and atraumatic  Eyes: conjunctiva without injection, sclera anicteric Nares: no rhinorrhea, congestion Oropharynx: mmm without erythema or exudation HEM: negative for cervical or periauricular lymphadenopathies CVS: RRR, nl s1 & s2, 3/6 SEM over R and LSB, no edema, cap refills > 3 secs RESP: no increased work of breathing, good air movement bilaterally, no rhonchi, crackles or wheeze GI: Bowel sounds present and normal, soft, tender to palpation over epigastric areas, non-distended, no guarding, no rebound, no mass GU: mild suprapubic tenderness, no CVA tenderness MSK: No apparent swelling or focal tenderness SKIN: no apparent skin lesion NEURO: Alert, awake and oriented 3, CNs 2-12 intact, motor 4 out of 5 in all extremities, sensation intact in all dermatomes, biceps and patellar reflex symmetric but diminished.   Labs and Imaging: CBC BMET   Recent Labs Lab 11/18/16 1143  WBC 6.7  HGB 15.4*  HCT 45.4  PLT 171    Recent Labs Lab 11/18/16 1143  NA 140  K 3.8  CL 97*  CO2 24  BUN 23*  CREATININE 2.21*  GLUCOSE 77  CALCIUM 10.0     Almon Hercules, MD 11/18/2016, 5:59 PM PGY-2, [facility] FPTS Intern pager: [phone], text pages welcome

## 2016-11-18 NOTE — ED Provider Notes (Addendum)
[facility]-EMERGENCY DEPT Provider Note   CSN: [id number] Arrival date & time: 11/18/16  1123     History   Chief Complaint Chief Complaint  Patient presents with  . Weakness  . Emesis    HPI [name] is a 76 y.o. female.  Level V caveat for urgent need for intervention. Multiple episodes of vomiting since Thursday. Patient is dehydrated. She resides in an assisted living facility. No fever, chills, dysuria, diarrhea, chest pain, dyspnea. Review systems positive for vague abdominal pain.      Past Medical History:  Diagnosis Date  . Abnormal heart rhythm   . Abnormality of gait 12/21/2015  . Afib (HCC)    h/o  . Anxiety   . Arthritis    "hips, shoulders" (01/05/2015)  . Chronic kidney disease (CKD), stage III (moderate)    [name] 01/05/2015  . Dementia   . Depression   . Echocardiogram abnormal 02/26/11   MVP,mild MR,AOV mildly sclerotic. EF >55%  . GERD (gastroesophageal reflux disease)   . Hypertension   . Hypothyroidism   . Memory difficulty 06/05/2016  . Osteoporosis   . Reactive airway disease   . Scoliosis   . Sleep apnea    "suppose to wear a mask; can't tolerate it" (01/05/2015)    Patient Active Problem List   Diagnosis Date Noted  . Insomnia 09/30/2016  . Memory difficulty 06/05/2016  . Abnormality of gait 12/21/2015  . Cytopenia 02/09/2015  . Protein-calorie malnutrition, severe (HCC) 01/07/2015  . Vomiting and diarrhea 01/05/2015  . Sleep walking 10/08/2014  . Restless legs 10/08/2014  . Essential hypertension 09/16/2014  . Chronic kidney disease, stage 3 09/16/2014  . Paroxysmal atrial tachycardia (HCC) 09/16/2014  . Hypothyroidism 09/16/2014  . Obstructive sleep apnea 07/18/2011    Past Surgical History:  Procedure Laterality Date  . CATARACT EXTRACTION, BILATERAL Bilateral   . CESAREAN SECTION  1967  . CESAREAN SECTION  1971  . DILATION AND CURETTAGE OF UTERUS    . JOINT REPLACEMENT    . OPEN REDUCTION INTERNAL FIXATION (ORIF) DISTAL  RADIAL FRACTURE Left 12/2009   [name] 12/28/2009  . TOTAL KNEE ARTHROPLASTY Left 03/2010   [name] 04/07/2010  . TUBAL LIGATION    . VAGINAL HYSTERECTOMY  1980    OB History    No data available       Home Medications    Prior to Admission medications   Medication Sig Start Date End Date Taking? Authorizing Provider  baclofen (LIORESAL) 10 MG tablet Take 5 mg by mouth at bedtime as needed for muscle spasms.     Historical Provider, MD  calcitRIOL (ROCALTROL) 0.25 MCG capsule Take 0.25 mcg by mouth. Mondays,Wednesdays, and Fridays    Historical Provider, MD  clonazePAM (KLONOPIN) 2 MG tablet Take 1 tablet (2 mg total) by mouth at bedtime. 09/30/16   Waymon Budge, MD  clopidogrel (PLAVIX) 75 MG tablet Take 1 tablet (75 mg total) by mouth daily. 06/05/16   York Spaniel, MD  diltiazem (CARDIZEM CD) 180 MG 24 hr capsule Take 1 capsule (180 mg total) by mouth daily. 01/10/16   Mihai Croitoru, MD  diltiazem (CARDIZEM) 30 MG tablet Take 30 mg by mouth at bedtime as needed (palpitations).    Historical Provider, MD  donepezil (ARICEPT) 10 MG tablet Take 1 tablet (10 mg total) by mouth at bedtime. 08/14/16   York Spaniel, MD  levothyroxine (SYNTHROID, LEVOTHROID) 112 MCG tablet Take 112 mcg by mouth daily before breakfast.    Historical  Provider, MD  metoprolol tartrate (LOPRESSOR) 25 MG tablet Take 12.5 mg by mouth 2 (two) times daily.     Historical Provider, MD  omega-3 acid ethyl esters (LOVAZA) 1 G capsule Take 1 g by mouth daily.      Historical Provider, MD  pantoprazole (PROTONIX) 40 MG tablet Take 40 mg by mouth daily.    Historical Provider, MD  polyethylene glycol (MIRALAX / GLYCOLAX) packet Take 17 g by mouth every other day.    Historical Provider, MD  sertraline (ZOLOFT) 100 MG tablet Take 150 mg by mouth daily.    Historical Provider, MD  SYMBICORT 160-4.5 MCG/ACT inhaler Inhale 2 puffs into the lungs 2 (two) times daily. 05/03/16   Historical Provider, MD  traMADol (ULTRAM) 50  MG tablet Take 50 mg by mouth 3 (three) times daily.     Historical Provider, MD  traZODone (DESYREL) 100 MG tablet Take 1 tablet (100 mg total) by mouth at bedtime. 09/30/16   Waymon Budgelinton D Young, MD  Vitamin D, Ergocalciferol, (DRISDOL) 50000 UNITS CAPS capsule Take 50,000 Units by mouth every 7 (seven) days. Mondays    Historical Provider, MD    Family History Family History  Problem Relation Age of Onset  . Cancer Mother   . Heart disease Father   . Cancer Father     Social History Social History  Substance Use Topics  . Smoking status: Never Smoker  . Smokeless tobacco: Never Used  . Alcohol use No     Allergies   Vicodin [hydrocodone-acetaminophen]   Review of Systems Review of Systems  Reason unable to perform ROS: Urgent need for intervention.     Physical Exam Updated Vital Signs BP 140/67   Pulse 64   Temp 98.4 F (36.9 C) (Oral)   Resp 17   Ht 5\' 2"  (1.575 m)   Wt 135 lb (61.2 kg)   SpO2 98%   BMI 24.69 kg/m   Physical Exam  Constitutional: She is oriented to person, place, and time.  Pale, dehydrated  HENT:  Head: Normocephalic and atraumatic.  Eyes: Conjunctivae are normal.  Neck: Neck supple.  Cardiovascular: Normal rate and regular rhythm.   Pulmonary/Chest: Effort normal and breath sounds normal.  Abdominal:  Minimal diffuse lower abdominal pain  Musculoskeletal: Normal range of motion.  Neurological: She is alert and oriented to person, place, and time.  Skin: Skin is warm and dry.  Psychiatric: She has a normal mood and affect. Her behavior is normal.  Nursing note and vitals reviewed.    ED Treatments / Results  Labs (all labs ordered are listed, but only abnormal results are displayed) Labs Reviewed  COMPREHENSIVE METABOLIC PANEL - Abnormal; Notable for the following:       Result Value   Chloride 97 (*)    BUN 23 (*)    Creatinine, Ser 2.21 (*)    Total Bilirubin 1.3 (*)    GFR calc non Af Amer 21 (*)    GFR calc Af Amer 24  (*)    Anion gap 19 (*)    All other components within normal limits  CBC WITH DIFFERENTIAL/PLATELET - Abnormal; Notable for the following:    Hemoglobin 15.4 (*)    All other components within normal limits  LIPASE, BLOOD - Abnormal; Notable for the following:    Lipase 66 (*)    All other components within normal limits  URINALYSIS, ROUTINE W REFLEX MICROSCOPIC  URINALYSIS, ROUTINE W REFLEX MICROSCOPIC  CBG MONITORING, ED  I-STAT  CG4 LACTIC ACID, ED  I-STAT CG4 LACTIC ACID, ED    EKG  EKG Interpretation None       Radiology No results found.  Procedures Procedures (including critical care time)  Medications Ordered in ED Medications  ondansetron (ZOFRAN) injection 4 mg (4 mg Intravenous Given 11/18/16 1243)  sodium chloride 0.9 % bolus 1,000 mL (1,000 mLs Intravenous New Bag/Given 11/18/16 1412)  sodium chloride 0.9 % bolus 1,000 mL (0 mLs Intravenous Stopped 11/18/16 1412)     Initial Impression / Assessment and Plan / ED Course  I have reviewed the triage vital signs and the nursing notes.  Pertinent labs & imaging results that were available during my care of the patient were reviewed by me and considered in my medical decision making (see chart for details).  Clinical Course     Patient is dehydrated. White count normal. Lipase and creatinine minimally elevated. IV fluids;  Urinalysis and CT abdomen/pelvis pending. Admit to family medicine.  Final Clinical Impressions(s) / ED Diagnoses   Final diagnoses:  Intractable vomiting with nausea, unspecified vomiting type    New Prescriptions New Prescriptions   No medications on file     Donnetta Hutching, MD 11/18/16 1513    Donnetta Hutching, MD 11/18/16 [phone]

## 2016-11-18 NOTE — ED Notes (Signed)
Pt transporting to CT  

## 2016-11-18 NOTE — ED Notes (Signed)
[phone] Elita Quick- [name] (daughter)

## 2016-11-18 NOTE — ED Triage Notes (Signed)
Pt brought [facility] by [facility] for emesis and weakness since Thursday.  Pt denies changes in bowel or bladder habits.  AO x 4, but pt is very lethargic.  CBG 78.  C/o headache and abdominal pain post emesis.

## 2016-11-19 ENCOUNTER — Encounter (HOSPITAL_COMMUNITY): Payer: Self-pay | Admitting: General Practice

## 2016-11-19 ENCOUNTER — Inpatient Hospital Stay (HOSPITAL_COMMUNITY): Payer: Medicare Other

## 2016-11-19 ENCOUNTER — Other Ambulatory Visit (HOSPITAL_COMMUNITY): Payer: Self-pay | Admitting: Radiology

## 2016-11-19 DIAGNOSIS — I10 Essential (primary) hypertension: Secondary | ICD-10-CM

## 2016-11-19 DIAGNOSIS — R011 Cardiac murmur, unspecified: Secondary | ICD-10-CM

## 2016-11-19 LAB — URINALYSIS, ROUTINE W REFLEX MICROSCOPIC
Bilirubin Urine: NEGATIVE
Glucose, UA: NEGATIVE mg/dL
Hgb urine dipstick: NEGATIVE
KETONES UR: 80 mg/dL — AB
Leukocytes, UA: NEGATIVE
Nitrite: NEGATIVE
PROTEIN: NEGATIVE mg/dL
Specific Gravity, Urine: 1.01 (ref 1.005–1.030)
pH: 6 (ref 5.0–8.0)

## 2016-11-19 LAB — COMPREHENSIVE METABOLIC PANEL
ALK PHOS: 41 U/L (ref 38–126)
ALT: 15 U/L (ref 14–54)
AST: 20 U/L (ref 15–41)
Albumin: 2.8 g/dL — ABNORMAL LOW (ref 3.5–5.0)
Anion gap: 16 — ABNORMAL HIGH (ref 5–15)
BILIRUBIN TOTAL: 1.1 mg/dL (ref 0.3–1.2)
BUN: 13 mg/dL (ref 6–20)
CHLORIDE: 105 mmol/L (ref 101–111)
CO2: 20 mmol/L — ABNORMAL LOW (ref 22–32)
CREATININE: 1.86 mg/dL — AB (ref 0.44–1.00)
Calcium: 8.7 mg/dL — ABNORMAL LOW (ref 8.9–10.3)
GFR calc Af Amer: 30 mL/min — ABNORMAL LOW (ref >60)
GFR, EST NON AFRICAN AMERICAN: 26 mL/min — AB (ref >60)
Glucose, Bld: 57 mg/dL — ABNORMAL LOW (ref 65–99)
Potassium: 3.3 mmol/L — ABNORMAL LOW (ref 3.5–5.1)
Sodium: 141 mmol/L (ref 135–145)
Total Protein: 5.5 g/dL — ABNORMAL LOW (ref 6.5–8.1)

## 2016-11-19 LAB — CBC
HCT: 34.7 % — ABNORMAL LOW (ref 36.0–46.0)
Hemoglobin: 11.4 g/dL — ABNORMAL LOW (ref 12.0–15.0)
MCH: 30.6 pg (ref 26.0–34.0)
MCHC: 32.9 g/dL (ref 30.0–36.0)
MCV: 93.3 fL (ref 78.0–100.0)
PLATELETS: 101 10*3/uL — AB (ref 150–400)
RBC: 3.72 MIL/uL — ABNORMAL LOW (ref 3.87–5.11)
RDW: 13.7 % (ref 11.5–15.5)
WBC: 4.8 10*3/uL (ref 4.0–10.5)

## 2016-11-19 LAB — TSH: TSH: 0.377 u[IU]/mL (ref 0.350–4.500)

## 2016-11-19 LAB — ECHOCARDIOGRAM COMPLETE
Height: 62 in
Weight: 2160 oz

## 2016-11-19 MED ORDER — PIPERACILLIN-TAZOBACTAM 3.375 G IVPB
3.3750 g | Freq: Three times a day (TID) | INTRAVENOUS | Status: DC
  Administered 2016-11-19 – 2016-11-23 (×13): 3.375 g via INTRAVENOUS
  Filled 2016-11-19 (×14): qty 50

## 2016-11-19 MED ORDER — ORAL CARE MOUTH RINSE
15.0000 mL | Freq: Two times a day (BID) | OROMUCOSAL | Status: DC
  Administered 2016-11-19 – 2016-11-26 (×9): 15 mL via OROMUCOSAL

## 2016-11-19 MED ORDER — DEXTROSE-NACL 5-0.9 % IV SOLN
INTRAVENOUS | Status: DC
  Administered 2016-11-19 (×2): via INTRAVENOUS
  Administered 2016-11-20: 125 mL via INTRAVENOUS
  Administered 2016-11-20 – 2016-11-22 (×5): via INTRAVENOUS

## 2016-11-19 MED ORDER — POTASSIUM CHLORIDE CRYS ER 20 MEQ PO TBCR
40.0000 meq | EXTENDED_RELEASE_TABLET | Freq: Two times a day (BID) | ORAL | Status: AC
  Administered 2016-11-19 (×2): 40 meq via ORAL
  Filled 2016-11-19 (×2): qty 2

## 2016-11-19 NOTE — Progress Notes (Signed)
  Echocardiogram 2D Echocardiogram has been performed.  [name] 11/19/2016, 2:48 PM

## 2016-11-19 NOTE — Progress Notes (Signed)
Family Medicine Teaching Service Daily Progress Note Intern Pager: [phone]  Patient name: [name] Medical record number: [id number] Date of birth: December 12, 1940 Age: 76 y.o. Gender: female  Primary Care Provider: Pcp Not In System Consultants: none Code Status: DNR  Pt Overview and Major Events to Date:  1/2: Patient admitted for HA and weakness in the setting of emesis and dehydration.  Assessment and Plan: [name] is a 76 y.o. female presenting with headache and weakness after 4 days of emesis, now with headache and weakness. PMH is significant for OSA, HTN, CKD-3, PAF, hypothyroidism, malnutrition and restless leg syndrome  # Abdominal Pain and Diarrhea/Emesis: Nausea, vomiting, and diarrhea 4 days. Afebrile. CT abdomen yielded evidence of descending colon containing, small bowel lymphadenitis, and mesenteric stranding suggesting possible colitis. No evidence of obstruction. No evidence of acute abdomen on exam. Diffuse tenderness, predominantly right upper quadrant. Patient denies surgical history with exception of previous C-section. - Obtaining abdominal ultrasound - Begin IV Zosyn >> consider transition to PO Cipro/Flagyl once no longer NPO (?1/3) - I&Os, including BMs - Increase IVF to 163ml/hr; add D5 to NS - Monitor for development of acute abdomen - Continue Zofran when necessary - Continue Protonix 40 mg daily  #AoCKD-3, likely 2/2 dehydration from emesis: Likely prerenal in the setting of dehydration. Serum creatinine 2.2 on admission. Baseline about 2.0. - Cr 1.86 today - K 3.3 (will replace) - IV fluid as above - Hold nephrotoxic medications  # Chronic small vessel ischemic disease: MRI brain on 07/18/2016 with moderate-severe perisylvian and mesial temporal atrophy and Moderate-severe periventricular and subcortical and pontine chronic small vessel ischemic disease. He has been on Plavix. -We will continue home Plavix for now  # PAF/Heart Murmur: RRR  now. She has 3/6 SEM over sternal borders left>right.  [place] Vasc score 3 (HTN, age and sex). Rate controlled by Diltiazem CD 180 mg +30 mg when necessary. Also on metoprolol 12.5 mg twice a day. Doesn't appear to be on anticoagulation.  - continue home metoprolol - Hold home Diltiazem for now (BP somewhat low) - EKG - Telemetry - Echo for cardiac murmur - Discussed anticoagulation  # Hypertension: Normotensive on admission - holding dilt while BP low - continue beta blocker  # OSA: Doesn't use CPAP at ALF -Nightly CPAP  # Hypothyroidism: On Synthroid 112 MCG at home -Continue on Synthroid -Repeat TSH >> 0.377 (no med change needed)  # Protein calorie malnutrition: Frail-looking patient -Consider nutrition consult when no longer NPO  # Insomnia: On clonazepam 2 mg and trazodone 100 mg at bedtime - Decrease clonazepam to 1 mg at bedtime - Trazodone 100 mg at bedtime  # ?Dementia: Appears to be on Aricept 10 mg at home -hold home Aricept for now  FEN/GI:  -Nothing by mouth except sips with meds  - IVF increased to 125ml/hr, adding D5 -Replete electrolytes as needed  -Protonix 40 mg daily   Prophylaxis:  -Lovenox  Disposition: pending improvement  Subjective:  Patient is complaining of worsening abdominal pain. Appears to be worse in the RUQ. Continues to have headache. States that headache had improved with (what I believe was) Ultram the night before. No other complaints. Endorses adequate BMs and urine output.  Objective: Temp:  [98.4 F (36.9 C)-98.5 F (36.9 C)] 98.5 F (36.9 C) (01/02 0635) Pulse Rate:  [64-71] 69 (01/02 0635) Resp:  [13-20] 16 (01/02 0635) BP: (125-148)/(51-67) 135/51 (01/02 0635) SpO2:  [92 %-100 %] 92 % (01/02 3086) Physical Exam: GEN:  appears frail and weak, able to sit in bed for exam, in some discomfort but NAD. Head: normocephalic and atraumatic  Eyes: conjunctiva without injection, sclera anicteric Neck: FROM CVS: RRR, nl s1 &  s2, 3/6 SEM over R and LSB, no edema RESP: no increased work of breathing, good air movement bilaterally, no rhonchi, crackles or wheeze GI: Bowel sounds present and normal, soft, generalized tenderness w/ palpation with worse pain over RUQ and epigastric areas, non-distended, no guarding, mild rebound, no mass GU: mild suprapubic tenderness, no CVA tenderness NEURO: Alert, awake and oriented 3, CNs 2-12 intact, strength equal bilaterally, sensation apparently intact  Laboratory:  Recent Labs Lab 11/18/16 1143 11/19/16 0404  WBC 6.7 4.8  HGB 15.4* 11.4*  HCT 45.4 34.7*  PLT 171 101*    Recent Labs Lab 11/18/16 1143 11/19/16 0404  NA 140 141  K 3.8 3.3*  CL 97* 105  CO2 24 20*  BUN 23* 13  CREATININE 2.21* 1.86*  CALCIUM 10.0 8.7*  PROT 7.3 5.5*  BILITOT 1.3* 1.1  ALKPHOS 59 41  ALT 20 15  AST 25 20  GLUCOSE 77 57*    Imaging/Diagnostic Tests: EKG: Unremarkable  Ana DeltonIan D McKeag, MD 11/19/2016, 1:27 PM PGY-3, [facility] FPTS Intern pager: [phone], text pages welcome

## 2016-11-19 NOTE — Progress Notes (Signed)
Pharmacy Antibiotic Note  [name] is a 76 y.o. female admitted on 11/18/2016 with possible infective colitis.  Pharmacy has been consulted for Zosyn dosing. CT of abdomen shows wall thickening of descending colon. Patient has significant RUQ tenderness on exam today, pending RUQ ultrasound. Afebrile, wbc wnl, scr elevated at 1.86 (baseline ~1.7-2, CrCl ~20-25 ml/min). Currently NPO.   Plan: Start Zosyn 3.375 gm IV every 8 hours today with plans to transition to po antibiotics once patient is no longer NPO.   Height: 5\' 2"  (157.5 cm) Weight: 135 lb (61.2 kg) IBW/kg (Calculated) : 50.1  Temp (24hrs), Avg:98.5 F (36.9 C), Min:98.4 F (36.9 C), Max:98.5 F (36.9 C)   Recent Labs Lab 11/18/16 1143 11/18/16 1201 11/19/16 0404  WBC 6.7  --  4.8  CREATININE 2.21*  --  1.86*  LATICACIDVEN  --  0.63  --     Estimated Creatinine Clearance: 23.2 mL/min (by C-G formula based on SCr of 1.86 mg/dL (H)).    Allergies  Allergen Reactions  . Vicodin [Hydrocodone-Acetaminophen] Nausea And Vomiting    Antimicrobials this admission: Zosyn 1/2 >>   Dose adjustments this admission: None  Microbiology results: 1/1 MRSA PCR: Neg  Thank you for allowing pharmacy to be a part of this patient's care.  Adline PotterSabrina Dunham 11/19/2016 10:59 AM

## 2016-11-20 ENCOUNTER — Inpatient Hospital Stay (HOSPITAL_COMMUNITY): Payer: Medicare Other

## 2016-11-20 DIAGNOSIS — K529 Noninfective gastroenteritis and colitis, unspecified: Secondary | ICD-10-CM

## 2016-11-20 DIAGNOSIS — R109 Unspecified abdominal pain: Secondary | ICD-10-CM

## 2016-11-20 LAB — BASIC METABOLIC PANEL
Anion gap: 4 — ABNORMAL LOW (ref 5–15)
BUN: 8 mg/dL (ref 6–20)
CHLORIDE: 112 mmol/L — AB (ref 101–111)
CO2: 27 mmol/L (ref 22–32)
Calcium: 8.9 mg/dL (ref 8.9–10.3)
Creatinine, Ser: 1.43 mg/dL — ABNORMAL HIGH (ref 0.44–1.00)
GFR calc Af Amer: 41 mL/min — ABNORMAL LOW (ref >60)
GFR calc non Af Amer: 35 mL/min — ABNORMAL LOW (ref >60)
GLUCOSE: 146 mg/dL — AB (ref 65–99)
POTASSIUM: 2.7 mmol/L — AB (ref 3.5–5.1)
Sodium: 143 mmol/L (ref 135–145)

## 2016-11-20 LAB — CBC
HEMATOCRIT: 33.8 % — AB (ref 36.0–46.0)
Hemoglobin: 11.4 g/dL — ABNORMAL LOW (ref 12.0–15.0)
MCH: 30.9 pg (ref 26.0–34.0)
MCHC: 33.7 g/dL (ref 30.0–36.0)
MCV: 91.6 fL (ref 78.0–100.0)
Platelets: 82 10*3/uL — ABNORMAL LOW (ref 150–400)
RBC: 3.69 MIL/uL — ABNORMAL LOW (ref 3.87–5.11)
RDW: 13.6 % (ref 11.5–15.5)
WBC: 4.4 10*3/uL (ref 4.0–10.5)

## 2016-11-20 MED ORDER — POTASSIUM CHLORIDE CRYS ER 20 MEQ PO TBCR
40.0000 meq | EXTENDED_RELEASE_TABLET | ORAL | Status: AC
  Administered 2016-11-20 (×3): 40 meq via ORAL
  Filled 2016-11-20 (×3): qty 2

## 2016-11-20 MED ORDER — ACETAMINOPHEN 325 MG PO TABS
650.0000 mg | ORAL_TABLET | Freq: Four times a day (QID) | ORAL | Status: DC
Start: 2016-11-20 — End: 2016-11-23
  Administered 2016-11-20 – 2016-11-23 (×5): 650 mg via ORAL
  Filled 2016-11-20 (×8): qty 2

## 2016-11-20 NOTE — Progress Notes (Signed)
Family Medicine Teaching Service Daily Progress Note Intern Pager: [phone]  Patient name: [name] Medical record number: [id number] Date of birth: 09-27-1941 Age: 76 y.o. Gender: female  Primary Care Provider: Pcp Not In System Consultants: none Code Status: DNR  Pt Overview and Major Events to Date:  1/2: Patient admitted for HA and weakness in the setting of emesis and dehydration.  Assessment and Plan: Ana FillersJudy C Pruden is a 76 y.o. female presenting with headache and weakness after 4 days of emesis, now with headache and weakness. PMH is significant for OSA, HTN, CKD-3, PAF, hypothyroidism, malnutrition and restless leg syndrome  # Abdominal Pain and Diarrhea/Emesis likely 2/2 colitis: Patient continues to have nausea, vomiting, and diarrhea. Afebrile. CT abdomen yielded evidence of descending colon containing, small bowel lymphadenitis, and mesenteric stranding suggesting possible colitis. No evidence of obstruction. No evidence of acute abdomen on exam. Diffuse tenderness, predominantly right upper quadrant but abdominal ultrasound: mild gallbladder wall thickening without gallstones, pericholecystic fluid or sonographic Murphy sign. Patient denies surgical history with exception of previous C-section. Last colonoscopy in 2008, will need repeat as outpatient when over acute illness. Differential includes C diff given multiple episodes of diarrhea with "slimy stools" per RN but unlikely since patient afebrile and has WBC WNL.  - IV Zosyn 11/19/16 >> consider transition to PO Cipro/Flagyl once tolerating PO - I&Os, including BMs - IVF D5 NS @ 17425ml/hr - Monitor for development of acute abdomen - Continue Zofran when necessary - Continue Protonix 40 mg daily  - C diff pending   #AKI on CKD-3, likely 2/2 dehydration from emesis, improving. Likely prerenal in the setting of dehydration. Serum creatinine 2.2 on admission. Baseline about 2.0. - Cr 1.43 today  - IV fluid as above - Hold  nephrotoxic medications  #Hyperkalemia likely 2/2 GI losses given patient has n/v, diarrhea. K today 2.7 - replete - monitor BMP   # Chronic small vessel ischemic disease: MRI brain on 07/18/2016 with moderate-severe perisylvian and mesial temporal atrophy and Moderate-severe periventricular and subcortical and pontine chronic small vessel ischemic disease. He has been on Plavix. -We will continue home Plavix for now  # PAT/Heart Murmur: RRR now. She has 3/6 SEM over sternal borders left>right.  - continue home metoprolol - Hold home Diltiazem for now (BP somewhat low) - EKG WNL 11/18/16 - Telemetry - Echo for cardiac murmur: EF 60-65%, G1DD, mild LVH, mild aortic stenosis  # Hypertension: Normotensive on admission, BP this am 133/56 - holding dilt while BP low - continue beta blocker  # OSA: Doesn't use CPAP at ALF -Nightly CPAP  # Hypothyroidism: On Synthroid 112 MCG at home -Continue on Synthroid -Repeat TSH >> 0.377 (no med change needed)  # Protein calorie malnutrition: Frail-looking patient -Consider nutrition consult when no longer NPO  # Insomnia: On clonazepam 2 mg and trazodone 100 mg at bedtime - Decrease clonazepam to 1 mg at bedtime - Trazodone 100 mg at bedtime  # ?Dementia: Appears to be on Aricept 10 mg at home -hold home Aricept for now  FEN/GI:  -Nothing by mouth except sips with meds  - IVF increased to 1125ml/hr, adding D5 -Replete electrolytes as needed  -Protonix 40 mg daily   Prophylaxis:  -Lovenox  Disposition: pending improvement  Subjective:  Has continued abdominal pain, diffuse but greater on R. Nausea and vomiting persist and has had difficulty tolerating food or water. Feels cold but denies fevers. States has had multiple loose bowel movements and has been urinating frequently.  Objective: Temp:  [97.9 F (36.6 C)-99.1 F (37.3 C)] 97.9 F (36.6 C) (01/03 0439) Pulse Rate:  [62-64] 62 (01/03 0439) Resp:  [16-18] 16 (01/03  0439) BP: (132-141)/(49-56) 133/56 (01/03 0439) SpO2:  [93 %-94 %] 94 % (01/03 0439) Physical Exam: GEN: appears frail and weak, able to sit in bed for exam, in some discomfort but NAD. Head: normocephalic and atraumatic  Eyes: conjunctiva without injection, sclera anicteric Neck: FROM CVS: RRR, nl s1 & s2, 3/6 SEM over R and LSB, no edema RESP: no increased work of breathing, good air movement bilaterally, no rhonchi, crackles or wheeze GI: Bowel sounds present and normal, soft, generalized tenderness w/ palpation with worse pain over RUQ and epigastric areas, non-distended, no guarding, mild rebound, no mass GU: mild suprapubic tenderness, no CVA tenderness NEURO: Alert, awake and oriented 3, CNs 2-12 intact, strength equal bilaterally, sensation apparently intact  Laboratory:  Recent Labs Lab 11/18/16 1143 11/19/16 0404 11/20/16 0550  WBC 6.7 4.8 4.4  HGB 15.4* 11.4* 11.4*  HCT 45.4 34.7* 33.8*  PLT 171 101* 82*    Recent Labs Lab 11/18/16 1143 11/19/16 0404  NA 140 141  K 3.8 3.3*  CL 97* 105  CO2 24 20*  BUN 23* 13  CREATININE 2.21* 1.86*  CALCIUM 10.0 8.7*  PROT 7.3 5.5*  BILITOT 1.3* 1.1  ALKPHOS 59 41  ALT 20 15  AST 25 20  GLUCOSE 77 57*    Imaging/Diagnostic Tests: EKG: Unremarkable  Leland Her, DO 11/20/2016, 7:18 AM PGY-1, [facility] FPTS Intern pager: [phone], text pages welcome

## 2016-11-20 NOTE — Progress Notes (Signed)
CRITICAL VALUE ALERT  Critical value received:  Potassium 2.7  Date of notification:  11/20/16  Time of notification:  0730  Critical value read back:Yes.    Nurse who received alert:  Jamse Belfastherry Tashanda Fuhrer, RN  MD notified (1st page):  Inpatient Service Text Page  Time of first page:  0735  MD notified (2nd page):  Time of second page:  Responding MD:  Nestor RampSara L Neal, MD  Time MD responded:  [phone]

## 2016-11-21 DIAGNOSIS — R1110 Vomiting, unspecified: Secondary | ICD-10-CM

## 2016-11-21 DIAGNOSIS — R197 Diarrhea, unspecified: Secondary | ICD-10-CM

## 2016-11-21 LAB — C DIFFICILE QUICK SCREEN W PCR REFLEX
C DIFFICILE (CDIFF) INTERP: NOT DETECTED
C DIFFICILE (CDIFF) TOXIN: NEGATIVE
C Diff antigen: NEGATIVE

## 2016-11-21 LAB — BASIC METABOLIC PANEL
ANION GAP: 8 (ref 5–15)
Anion gap: 9 (ref 5–15)
BUN: <5 mg/dL — ABNORMAL LOW (ref 6–20)
BUN: <5 mg/dL — ABNORMAL LOW (ref 6–20)
CALCIUM: 8.8 mg/dL — AB (ref 8.9–10.3)
CHLORIDE: 104 mmol/L (ref 101–111)
CO2: 30 mmol/L (ref 22–32)
CO2: 29 mmol/L (ref 22–32)
CREATININE: 1.21 mg/dL — AB (ref 0.44–1.00)
Calcium: 9.1 mg/dL (ref 8.9–10.3)
Chloride: 105 mmol/L (ref 101–111)
Creatinine, Ser: 1.21 mg/dL — ABNORMAL HIGH (ref 0.44–1.00)
GFR calc Af Amer: 50 mL/min — ABNORMAL LOW (ref >60)
GFR calc Af Amer: 50 mL/min — ABNORMAL LOW (ref >60)
GFR calc non Af Amer: 43 mL/min — ABNORMAL LOW (ref >60)
GFR, EST NON AFRICAN AMERICAN: 43 mL/min — AB (ref >60)
Glucose, Bld: 114 mg/dL — ABNORMAL HIGH (ref 65–99)
Glucose, Bld: 116 mg/dL — ABNORMAL HIGH (ref 65–99)
Potassium: 2.3 mmol/L — CL (ref 3.5–5.1)
Potassium: 2.8 mmol/L — ABNORMAL LOW (ref 3.5–5.1)
SODIUM: 142 mmol/L (ref 135–145)
Sodium: 143 mmol/L (ref 135–145)

## 2016-11-21 LAB — CBC
HCT: 36.4 % (ref 36.0–46.0)
HEMOGLOBIN: 12.4 g/dL (ref 12.0–15.0)
MCH: 30.8 pg (ref 26.0–34.0)
MCHC: 34.1 g/dL (ref 30.0–36.0)
MCV: 90.3 fL (ref 78.0–100.0)
Platelets: 78 10*3/uL — ABNORMAL LOW (ref 150–400)
RBC: 4.03 MIL/uL (ref 3.87–5.11)
RDW: 13.3 % (ref 11.5–15.5)
WBC: 4.2 10*3/uL (ref 4.0–10.5)

## 2016-11-21 LAB — MAGNESIUM: MAGNESIUM: 1.2 mg/dL — AB (ref 1.7–2.4)

## 2016-11-21 MED ORDER — POTASSIUM CHLORIDE CRYS ER 20 MEQ PO TBCR
40.0000 meq | EXTENDED_RELEASE_TABLET | ORAL | Status: AC
  Administered 2016-11-21 (×3): 40 meq via ORAL
  Filled 2016-11-21 (×3): qty 2

## 2016-11-21 MED ORDER — POTASSIUM CHLORIDE CRYS ER 20 MEQ PO TBCR
40.0000 meq | EXTENDED_RELEASE_TABLET | ORAL | Status: AC
  Administered 2016-11-21 – 2016-11-22 (×3): 40 meq via ORAL
  Filled 2016-11-21 (×2): qty 2

## 2016-11-21 MED ORDER — MAGNESIUM SULFATE 2 GM/50ML IV SOLN
2.0000 g | Freq: Once | INTRAVENOUS | Status: AC
  Administered 2016-11-21: 2 g via INTRAVENOUS
  Filled 2016-11-21: qty 50

## 2016-11-21 MED ORDER — TRAMADOL HCL 50 MG PO TABS
100.0000 mg | ORAL_TABLET | Freq: Four times a day (QID) | ORAL | Status: DC | PRN
Start: 2016-11-21 — End: 2016-11-23
  Administered 2016-11-21 – 2016-11-23 (×5): 100 mg via ORAL
  Filled 2016-11-21 (×5): qty 2

## 2016-11-21 NOTE — Progress Notes (Signed)
Family Medicine Teaching Service Daily Progress Note Intern Pager: [phone]  Patient name: [name] Medical record number: [id number] Date of birth: [birth year] Age: 76 y.o. Gender: female  Primary Care Provider: Pcp Not In System Consultants: none Code Status: DNR  Pt Overview and Major Events to Date:  01/02: patient admitted for HA and weakness in the setting of emesis and dehydration  Assessment and Plan: [name] is a 76 y.o. female presenting with headache and weakness after 4 days of emesis, now with headache and weakness. PMH is significant for OSA, HTN, CKD-3, PAF, hypothyroidism, malnutrition and restless leg syndrome.  #Abdominal Pain and Diarrhea/Emesis likely 2/2 colitis: Patient continues to have n/v and diarrhea. VSS and afebrile. CT abdomen yielded evidence of descending colon containing, small bowel lymphadenitis, and mesenteric stranding suggesting possible colitis. No evidence of obstruction or acute abdomen on exam. Diffuse tenderness, predominantly RUQ but abdominal US: mild gallbladder wall thickening without gallstones, pericholecystic fluid or sonographic Murphy sign. KUB negative. H/o c-sec, otherwise neg Surg Hx. Last colonoscopy in 2008, will need repeat as outpatient when over acute illness. Differential includes C diff given multiple episodes of diarrhea with "slimy stools" per RN but unlikely since patient afebrile and has WBC WNL.  --IV Zosyn 11/19/16 >> consider transition to PO Cipro/Flagyl once tolerating PO --I&Os, including BMs --IVF D5 NS @ 165ml/hr --Monitor for development of acute abdomen --Zofran PRN --Protonix 40 mg daily  --C-diff pending   #AKI on CKD-3: likely prerenal in the setting of dehydration 2/2 emesis. Serum creatinine 2.2 on admission. Baseline about 2.0. At 1.21 today. --IV fluid as above --Hold nephrotoxic medications  #Hypokalemia: likely 2/2 GI losses given patient has n/v, diarrhea. K today 2.3 this AM. --Replete with  PO K-dur 40 mEq q4h x3 doses --Monitor BMP   #Thrombocytopenia, Acute, Worsening: decrease in plt count 171>78 over course of 4 days. Baseline is around 118. On lovenox SQ. Had a HIT workup 2 years ago negative. Not likely HIT given normal WBC and Hgb with absence of bleeding with VSS. Will need to monitor plts closely and consider stopping heparin if worsens. --Monitor plt ct, if worsens consider stopping heparin and perform HITworkup  #Chronic Small Vessel Ischemic Disease: MRI brain on 07/18/2016 with moderate-severe perisylvian and mesial temporal atrophy and Moderate-severe periventricular and subcortical and pontine chronic small vessel ischemic disease. He has been on Plavix. --We will continue home Plavix for now  #PAT/Heart Murmur: RRR now. She has 3/6 SEM over sternal borders left>right. EKG WNL 11/18/16. ECHO for cardiac murmur: EF 60-65%, G1DD, mild LVH, mild aortic stenosis. --Continue home Lopressor 12.5 BID --Hold home Diltiazem for now (BP somewhat low) --Telemetry  #Hypertension: Normotensive on admission, BP mildly elevated 150s/60s. --Holding dilt while BP low --Continue home Lopressor 12.5 BID  #OSA: Doesn't use CPAP at ALF --Nightly CPAP  #Hypothyroidism: On Synthroid 112 MCG at home --Continue on Synthroid --Repeat TSH >> 0.377 (no med change needed)  #Protein calorie malnutrition: Frail-looking patient --Consider nutrition consult when no longer NPO  #Insomnia: On clonazepam 2 mg and trazodone 100 mg at bedtime --Decrease clonazepam to 1 mg at bedtime --Trazodone 100 mg at bedtime  #?Dementia: Appears to be on Aricept 10 mg at home --Hold home Aricept for now  FEN/GI: nothing by mouth except sips with meds, IVF increased to 176ml/hr, adding D5, Protonix 40 mg daily  PPx: lovenox SQ  Disposition: pending improvement of diarrhea and PO intake  Subjective:  Patient says she is having diarrhea  and stool incontinence. Occassionally has nausea but no  vomiting. Interested in trying to eat and drink. Continues to have HA. Abdominal pain is diffuse.  Objective: Temp:  [98.6 F (37 C)-99.7 F (37.6 C)] 98.6 F (37 C) (01/04 0601) Pulse Rate:  [58-65] 58 (01/04 0601) Resp:  [16] 16 (01/04 0601) BP: (154-161)/(62-69) 156/62 (01/04 0601) SpO2:  [95 %-96 %] 96 % (01/04 0601) Physical Exam: General: elderly frail woman, well nourished, well developed, mild acute distress with palpation of abdomen with non-toxic appearance HEENT: normocephalic, atraumatic, moist mucous membranes CV: regular rate and rhythm without murmurs, rubs, or gallops Lungs: clear to auscultation bilaterally with normal work of breathing Abdomen: soft, ND, diffusely tender without rebound or guarding, no masses or organomegaly palpable, normoactive bowel sounds Skin: warm, dry, no rashes or lesions, cap refill < 2 seconds Extremities: warm and well perfused, normal tone  Laboratory: C-diff: pending UA: negative TSH: 0.377 Lactic acid: 0.63 Lipase: 66  Recent Labs Lab 11/19/16 0404 11/20/16 0550 11/21/16 0618  WBC 4.8 4.4 4.2  HGB 11.4* 11.4* 12.4  HCT 34.7* 33.8* 36.4  PLT 101* 82* 78*    Recent Labs Lab 11/18/16 1143 11/19/16 0404 11/20/16 0550 11/21/16 0618  NA 140 141 143 143  K 3.8 3.3* 2.7* 2.3*  CL 97* 105 112* 105  CO2 24 20* 27 30  BUN 23* 13 8 <5*  CREATININE 2.21* 1.86* 1.43* 1.21*  CALCIUM 10.0 8.7* 8.9 8.8*  PROT 7.3 5.5*  --   --   BILITOT 1.3* 1.1  --   --   ALKPHOS 59 41  --   --   ALT 20 15  --   --   AST 25 20  --   --   GLUCOSE 77 57* 146* 114*    Imaging/Diagnostic Tests: EKG: Unremarkable US Abdomen Complete (11/19/2016) FINDINGS: Gallbladder: Mild gallbladder wall thickening but no gallstones, pericholecystic fluid or sonographic Murphy sign to suggest acute cholecystitis. Common bile duct: Diameter: 4.1 mm Liver: Normal echogenicity without focal lesion or biliary dilatation. IVC: Normal caliber Pancreas:  Sonographically unremarkable. Spleen: Normal size. No focal lesions. Small amount of perisplenic fluid. Right Kidney: Length: 9.1 cm. Increased echogenicity but normal renal cortical thickness. No renal lesions or hydronephrosis. Left Kidney: Length: 8.6 cm. Increased echogenicity but normal renal cortical thickness. No renal lesions or hydronephrosis. Abdominal aorta: Normal caliber Other findings: None.  IMPRESSION: Mild gallbladder wall thickening but no gallstones, pericholecystic fluid or sonographic Murphy sign. Normal caliber common bile duct and normal liver. Normal sonographic appearance of the pancreas, spleen and both kidneys except for mild medical renal disease.  DG Abd 1 View (11/20/2016) FINDINGS: Moderate scoliosis of the spine. Nonobstructive bowel gas pattern. No pathologic calcifications.  IMPRESSION: Nonobstructed bowel-gas pattern   Wendee Beaversavid J McMullen, DO 11/21/2016, 8:12 AM PGY-1, [facility] FPT[facility] Intern pager: [phone], text pages welcome

## 2016-11-21 NOTE — Progress Notes (Signed)
CRITICAL VALUE ALERT  Critical value received: K 2.3  Date of notification: 11/21/2016  Time of notification:  0725  Critical value read back:y   Nurse who received alert:  Z Casmira Cramer  MD notified (1st page):  On call MD  Time of first page:  0729  MD notified (2nd page):  Time of second page:  Responding MD:  Awaiting call  Time MD responded:  Awaiting call

## 2016-11-21 NOTE — Discharge Summary (Signed)
Family [facility] Summary  Patient name: [name] Medical record number: [id number] Date of birth: Aug 25, 1941 Age: 76 y.o. Gender: female Date of Admission: 11/18/2016  Date of Discharge: 11/26/2016 Admitting Physician: Nestor RampSara L Neal, MD  Primary Care Provider: Pcp Not In System Consultants: gastroenterology  Indication for Hospitalization: decreased PO intake and vomiting with diarrhea  Discharge Diagnoses/Problem List:  Abdominal Pain and Diarrhea/Emesis likely 2/2 Colitis AKI on CKD-3 Hypokalemia with Hypomagnesemia Thrombocytopenia Chronic Small Vessel Ischemic Disease PAT/Heart Murmur Hypertension OSA Hypothyroidism Protein calorie malnutrition Insomnia  Disposition: SNF  Discharge Condition: stable, improved  Discharge Exam:  General: elderly frail woman, well nourished, well developed, no acute distress with non-toxic appearance HEENT: normocephalic, atraumatic, moist mucous membranes CV: regular rate and rhythm without murmurs, rubs, or gallops Lungs: clear to auscultation bilaterally with normal work of breathing on room air Abdomen: soft, ND,minimal RUQ tenderness without rebound or guarding, no masses or organomegaly palpable, normoactive bowel sounds Skin: warm, dry, moderately erythematous around perianal region w/o skin breakdown, no lesions, cap refill < 2 seconds Extremities: warm and well perfused, normal tone  Brief Hospital Course:  Ana CostainJudy C Obannonis a 76 y.o.femalepresenting with headache and weakness after 4 days of emesis, now with headache and weakness. PMH is significant for OSA, HTN, CKD-3, PAF, hypothyroidism, malnutrition andrestless leg syndrome.  [facility] and NBNB vomiting of 4 days duration. Other symtpoms endorsed include HA, epigastric pain, fatigue, and decreased PO intake. She denied fevers, chills, CP, SOB, or dysuria. She was received fluid bolus in ED.   She did not p/w  meningeal symptoms so head imaging was held. PE was remarkable for epigastric tenderness. Lipase was neg. CT abdomen with wall thickening of descending colon versus colitis with some hepatic steatosis. Abdominal US c/w mild gallbladder wall thickening without gallstones, pericholecystic fluid or sonographic Murphy sign. Pertinent labs included neg flu, neg GI panel, neg FOBT. GI were consulted and performed a sigmoidoscopy which was neg for infectious sources. Biopsies were obtained which are pending. The source was believed to be a reactive colitis from a virus, IBS, microscopic colitis, or overflow incontinence.  Patient thrombocytopenia was noted but gradually improved over the admission back to baseline. Patient also p/w AKI which resolved to baseline, likely 2/2 dehydration. Vitals remained stable and afebrile with improved PO intake and adequate hydration. Patient was safe for d/c back to ALF on 11/26/2016.  Issues for Follow Up:  1. F/u sigmoidoscopy biopsy results and ova/parasite lab. 2. Recheck CBC and BMET for platelets and creatinine respectively. Consider checking mag level given low during admission. 3. Resume coreg at PCP f/u. Held given normotensive pressures with recovering status from deydration. Patient did continue Lopressor 12.5 mg BID. 4. Patient to receive Lomotil TID ac/hs. Continue Miralax per GI recs. Continue probiotics. Did not rec f/u colonoscopy outpt given DNR status. 5. Barrier cream QID for perianal dermatitis 2/2 to diarrheal irritation. 6. Patient should receive assistance upon return per PT recs, otherwise should obtain a wheelchair. 7. Nutrition recs Ensure Enlive po TID, each supplement provides 350 kcal and 20 grams of protein.  Significant Procedures:  Sigmoidoscopy (11/24/2016): Perianal fungal rash found on perianal exam. Rectal tenderness found on digital rectal exam. Vascular-pattern-decreased mucosa in the sigmoid colon and in the descending colon. Biopsied.  The examination was otherwise normal.  Significant Labs and Imaging:  C-diff: negative GI panel: negative FOBT: negative Flu: negative UA: negative TSH: 0.377 Lactic acid: 0.63 Lipase: 66 Mg:  1.2>2.0>2.1  Recent Labs Lab 11/24/16 0723 11/25/16 0520 11/26/16 0807  WBC 5.4 5.3 5.0  HGB 12.5 11.6* 11.2*  HCT 37.7 35.2* 34.2*  PLT 85* 92* 115*    Recent Labs Lab 11/21/16 1914 11/22/16 0616 11/22/16 1512 11/23/16 0506  11/24/16 0723 11/25/16 0520 11/26/16 0807  NA  --  141 139 141  --  138 141 138  K  --  2.6* 3.6 5.5*  < > 4.1 4.6 3.7  CL  --  106 103 108  --  104 105 102  CO2  --  27 27 29   --  27 29 29   GLUCOSE  --  100* 102* 100*  --  93 79 90  BUN  --  <5* <5* <5*  --  <5* 6 11  CREATININE  --  1.00 1.15* 1.11*  --  1.27* 1.59* 1.83*  CALCIUM  --  9.1 9.6 9.6  --  10.1 10.0 9.5  MG 1.2* 2.0  --  2.1  --   --   --   --   < > = values in this interval not displayed.  Imaging/Diagnostic Tests: Flexible Sigmoidoscopy (11/24/2016) IMPRESSION: - Perianal fungal rash found on perianal exam. - Rectal tenderness found on digital rectal exam. - Vascular-pattern-decreased mucosa in the sigmoid colon and in the descending colon.  EKG: Unremarkable  US Abdomen Complete (11/19/2016) FINDINGS: Gallbladder: Mild gallbladder wall thickening but no gallstones, pericholecystic fluid or sonographic Murphy sign to suggest acute cholecystitis. Common bile duct: Diameter: 4.1 mm Liver: Normal echogenicity without focal lesion or biliary dilatation. IVC: Normal caliber Pancreas: Sonographically unremarkable. Spleen: Normal size. No focal lesions. Small amount of perisplenic fluid. Right Kidney: Length: 9.1 cm. Increased echogenicity but normal renal cortical thickness. No renal lesions or hydronephrosis. Left Kidney: Length: 8.6 cm. Increased echogenicity but normal renal cortical thickness. No renal lesions or hydronephrosis. Abdominal aorta: Normal caliber Other findings:  None.  IMPRESSION: Mild gallbladder wall thickening but no gallstones, pericholecystic fluid or sonographic Murphy sign. Normal caliber common bile duct and normal liver. Normal sonographic appearance of the pancreas, spleen and both kidneys except for mild medical renal disease.  DG Abd 1 View (11/20/2016) FINDINGS: Moderate scoliosis of the spine. Nonobstructive bowel gas pattern. No pathologic calcifications.  IMPRESSION: Nonobstructed bowel-gas pattern  Transthoracic Echocardiography (11/19/2016) Study Conclusions - Left ventricle: The cavity size was normal. Wall thickness was increased in a pattern of mild LVH. Systolic function was normal. The estimated ejection fraction was in the range of 60% to 65%. Wall motion was normal; there were no regional wall motion abnormalities. Doppler parameters are consistent with abnormal left ventricular relaxation (grade 1 diastolic dysfunction). - Aortic valve: Trileaflet; moderately calcified leaflets. There was mild stenosis. Mean gradient (S): 12 mm Hg. - Mitral valve: Mildly calcified annulus. - Left atrium: The atrium was mildly dilated. - Right ventricle: The cavity size was normal. Systolic function was normal. - Pulmonary arteries: No complete TR doppler jet so unable to estimate PA systolic pressure. - Inferior vena cava: The vessel was normal in size. The respirophasic diameter changes were in the normal range (>= 50%), consistent with normal central venous pressure.  Impressions: - Normal LV size with mild LV hypertrophy. EF 60-65%. Normal RV size and systolic function. Mild aortic stenosis.  Results/Tests Pending at Time of Discharge: ova/parasite, sigmoidoscopy biopsy.  Discharge Medications:  Allergies as of 11/26/2016      Reactions   Vicodin [hydrocodone-acetaminophen] Nausea And Vomiting      Medication  List    STOP taking these medications   diltiazem 180 MG 24 hr capsule Commonly  known as:  CARDIZEM CD   diphenhydrAMINE 25 MG tablet Commonly known as:  BENADRYL   loperamide 2 MG capsule Commonly known as:  IMODIUM     TAKE these medications   acetaminophen 500 MG tablet Commonly known as:  TYLENOL Take 1,000 mg by mouth every 6 (six) hours as needed for headache (minor discomfort (contact MD if headache or pain persists for more than 24 hours).   baclofen 10 MG tablet Commonly known as:  LIORESAL Take 5 mg by mouth at bedtime as needed (back spasms).   budesonide-formoterol 160-4.5 MCG/ACT inhaler Commonly known as:  SYMBICORT Inhale 2 puffs into the lungs 2 (two) times daily. Rinse mouth after use - discard 3 months after opening   calcitRIOL 0.25 MCG capsule Commonly known as:  ROCALTROL Take 0.25 mcg by mouth every Monday, Wednesday, and Friday. Do not crush   clonazePAM 2 MG tablet Commonly known as:  KLONOPIN Take 1 tablet (2 mg total) by mouth at bedtime.   clopidogrel 75 MG tablet Commonly known as:  PLAVIX Take 1 tablet (75 mg total) by mouth daily.   diltiazem 30 MG tablet Commonly known as:  CARDIZEM Take 30 mg by mouth at bedtime as needed (palpitations or tachycardia). Do not crush   diphenoxylate-atropine 2.5-0.025 MG tablet Commonly known as:  LOMOTIL Take 1 tablet by mouth 3 (three) times daily as needed for diarrhea or loose stools.   donepezil 10 MG tablet Commonly known as:  ARICEPT Take 1 tablet (10 mg total) by mouth at bedtime.   feeding supplement (ENSURE ENLIVE) Liqd Take 237 mLs by mouth 3 (three) times daily between meals.   Fish Oil 1000 MG Caps Take 1,000 mg by mouth daily. Do not crush   levothyroxine 112 MCG tablet Commonly known as:  SYNTHROID, LEVOTHROID Take 112 mcg by mouth daily.   metoprolol tartrate 25 MG tablet Commonly known as:  LOPRESSOR Take 12.5 mg by mouth 2 (two) times daily.   ondansetron 4 MG tablet Commonly known as:  ZOFRAN Take 4 mg by mouth every 8 (eight) hours as needed for  nausea or vomiting.   pantoprazole 40 MG tablet Commonly known as:  PROTONIX Take 40 mg by mouth daily. Do not crush   polyethylene glycol packet Commonly known as:  MIRALAX / GLYCOLAX Take 17 g by mouth daily as needed (constipation). Mix in 8 oz of water and drink   PROBIOTIC PO Take 1 tablet by mouth 2 (two) times daily. Do not crush   traMADol 50 MG tablet Commonly known as:  ULTRAM Take 50 mg by mouth See admin instructions. Take 1 tablet (50 mg) by mouth 3 times daily, may also take 1 tablet every 8 hours as needed for pain   traZODone 100 MG tablet Commonly known as:  DESYREL Take 1 tablet (100 mg total) by mouth at bedtime.   ZOLOFT 100 MG tablet Generic drug:  sertraline Take 150 mg by mouth daily.       Discharge Instructions: Please refer to Patient Instructions section of EMR for full details.  Patient was counseled important signs and symptoms that should prompt return to medical care, changes in medications, dietary instructions, activity restrictions, and follow up appointments.   Follow-Up Appointments: Patient to SNF   Wendee Beavers, DO 11/26/2016, 2:22 PM PGY-1, [facility]

## 2016-11-22 LAB — GASTROINTESTINAL PANEL BY PCR, STOOL (REPLACES STOOL CULTURE)
ASTROVIRUS: NOT DETECTED
Adenovirus F40/41: NOT DETECTED
Campylobacter species: NOT DETECTED
Cryptosporidium: NOT DETECTED
Cyclospora cayetanensis: NOT DETECTED
ENTEROAGGREGATIVE E COLI (EAEC): NOT DETECTED
ENTEROTOXIGENIC E COLI (ETEC): NOT DETECTED
Entamoeba histolytica: NOT DETECTED
Enteropathogenic E coli (EPEC): NOT DETECTED
GIARDIA LAMBLIA: NOT DETECTED
NOROVIRUS GI/GII: NOT DETECTED
Plesimonas shigelloides: NOT DETECTED
ROTAVIRUS A: NOT DETECTED
SALMONELLA SPECIES: NOT DETECTED
SAPOVIRUS (I, II, IV, AND V): NOT DETECTED
SHIGA LIKE TOXIN PRODUCING E COLI (STEC): NOT DETECTED
Shigella/Enteroinvasive E coli (EIEC): NOT DETECTED
Vibrio cholerae: NOT DETECTED
Vibrio species: NOT DETECTED
Yersinia enterocolitica: NOT DETECTED

## 2016-11-22 LAB — BASIC METABOLIC PANEL
Anion gap: 8 (ref 5–15)
Anion gap: 9 (ref 5–15)
BUN: <5 mg/dL — ABNORMAL LOW (ref 6–20)
CALCIUM: 9.1 mg/dL (ref 8.9–10.3)
CHLORIDE: 106 mmol/L (ref 101–111)
CHLORIDE: 103 mmol/L (ref 101–111)
CO2: 27 mmol/L (ref 22–32)
CO2: 27 mmol/L (ref 22–32)
CREATININE: 1 mg/dL (ref 0.44–1.00)
Calcium: 9.6 mg/dL (ref 8.9–10.3)
Creatinine, Ser: 1.15 mg/dL — ABNORMAL HIGH (ref 0.44–1.00)
GFR calc Af Amer: >60 mL/min (ref >60)
GFR calc non Af Amer: 55 mL/min — ABNORMAL LOW (ref >60)
GFR calc non Af Amer: 46 mL/min — ABNORMAL LOW (ref >60)
GFR, EST AFRICAN AMERICAN: 53 mL/min — AB (ref >60)
Glucose, Bld: 100 mg/dL — ABNORMAL HIGH (ref 65–99)
Glucose, Bld: 102 mg/dL — ABNORMAL HIGH (ref 65–99)
POTASSIUM: 3.6 mmol/L (ref 3.5–5.1)
Potassium: 2.6 mmol/L — CL (ref 3.5–5.1)
SODIUM: 139 mmol/L (ref 135–145)
Sodium: 141 mmol/L (ref 135–145)

## 2016-11-22 LAB — CBC
HEMATOCRIT: 37.4 % (ref 36.0–46.0)
HEMOGLOBIN: 12.9 g/dL (ref 12.0–15.0)
MCH: 30.9 pg (ref 26.0–34.0)
MCHC: 34.5 g/dL (ref 30.0–36.0)
MCV: 89.5 fL (ref 78.0–100.0)
Platelets: 75 10*3/uL — ABNORMAL LOW (ref 150–400)
RBC: 4.18 MIL/uL (ref 3.87–5.11)
RDW: 13.2 % (ref 11.5–15.5)
WBC: 4.9 10*3/uL (ref 4.0–10.5)

## 2016-11-22 LAB — INFLUENZA PANEL BY PCR (TYPE A & B)
INFLAPCR: NEGATIVE
INFLBPCR: NEGATIVE

## 2016-11-22 LAB — MAGNESIUM: Magnesium: 2 mg/dL (ref 1.7–2.4)

## 2016-11-22 LAB — OCCULT BLOOD X 1 CARD TO LAB, STOOL: Fecal Occult Bld: NEGATIVE

## 2016-11-22 MED ORDER — POTASSIUM CHLORIDE CRYS ER 20 MEQ PO TBCR
40.0000 meq | EXTENDED_RELEASE_TABLET | Freq: Once | ORAL | Status: AC
  Administered 2016-11-22: 40 meq via ORAL
  Filled 2016-11-22: qty 2

## 2016-11-22 MED ORDER — GERHARDT'S BUTT CREAM
TOPICAL_CREAM | Freq: Four times a day (QID) | CUTANEOUS | Status: DC | PRN
  Administered 2016-11-22 – 2016-11-23 (×4): via TOPICAL
  Filled 2016-11-22: qty 1

## 2016-11-22 MED ORDER — BOOST / RESOURCE BREEZE PO LIQD
1.0000 | Freq: Three times a day (TID) | ORAL | Status: DC
  Administered 2016-11-22 – 2016-11-24 (×2): 1 via ORAL

## 2016-11-22 MED ORDER — MAGNESIUM SULFATE 2 GM/50ML IV SOLN
2.0000 g | Freq: Once | INTRAVENOUS | Status: AC
  Administered 2016-11-22: 2 g via INTRAVENOUS
  Filled 2016-11-22: qty 50

## 2016-11-22 MED ORDER — POTASSIUM CHLORIDE CRYS ER 20 MEQ PO TBCR
40.0000 meq | EXTENDED_RELEASE_TABLET | ORAL | Status: AC
  Administered 2016-11-22 (×2): 40 meq via ORAL
  Filled 2016-11-22 (×3): qty 2

## 2016-11-22 MED ORDER — BACID PO TABS
2.0000 | ORAL_TABLET | Freq: Three times a day (TID) | ORAL | Status: DC
  Administered 2016-11-22 – 2016-11-26 (×13): 2 via ORAL
  Filled 2016-11-22 (×16): qty 2

## 2016-11-22 MED ORDER — DEXTROSE-NACL 5-0.45 % IV SOLN
INTRAVENOUS | Status: AC
  Administered 2016-11-22 – 2016-11-23 (×2): via INTRAVENOUS
  Filled 2016-11-22 (×5): qty 1000

## 2016-11-22 NOTE — Progress Notes (Signed)
Family Medicine Teaching Service Daily Progress Note Intern Pager: [phone]  Patient name: [name] Medical record number: [id number] Date of birth: 1941/06/20 Age: 76 y.o. Gender: female  Primary Care Provider: Pcp Not In System Consultants: none Code Status: DNR  Pt Overview and Major Events to Date:  01/02: patient admitted for HA and weakness in the setting of emesis and dehydration  Assessment and Plan: [name] is a 76 y.o. female presenting with headache and weakness after 4 days of emesis, now with headache and weakness. PMH is significant for OSA, HTN, CKD-3, PAF, hypothyroidism, malnutrition and restless leg syndrome.  #Abdominal Pain and Diarrhea/Emesis likely 2/2 Colitis: Patient continues to have nausea w/o vomiting and diarrhea. VSS and afebrile. CT abdomen yielded evidence of descending colon containing, small bowel lymphadenitis, and mesenteric stranding suggesting possible colitis. No evidence of obstruction or acute abdomen on exam. Diffuse tenderness, predominantly RUQ but abdominal US: mild gallbladder wall thickening without gallstones, pericholecystic fluid or sonographic Murphy sign. KUB negative. H/o c-sec, otherwise neg Surg Hx. Last colonoscopy in 2008, will need repeat as outpatient when over acute illness. C diff negative. Patient remains afebrile and has WBC WNL. --IV Zosyn 11/19/16 >> consider transition to PO Cipro/Flagyl once tolerating PO --Will order GI panel, ova + parasite, flu --Nutrition consult given GI losses --Daily weights --I&Os, including BMs --IVF D5NS @ 118ml/hr --Monitor for development of acute abdomen --Zofran PRN --Protonix 40 mg daily   --Increased tramadol 50 q8h mg PRN to 100 mg q6h PRN --Probiotics  #AKI on CKD-3: likely prerenal in the setting of dehydration 2/2 emesis. Serum creatinine 2.2 on admission. Baseline about 2.0. At 1.00 today. --IV fluid as above --Hold nephrotoxic medications  #Hypokalemia with  Hypomagnesemia: likely 2/2 GI losses given patient has n/v, diarrhea. K today 2.3 this AM. Mg found to be low at 1.2, replete with repeat level at 2.0. --Replete with PO K-dur 40 mEq q4h x3 doses, added 40 mEq to IVF --AM dose 2 g Mg --Monitor BMP   #Thrombocytopenia, Acute, Worsening: decrease in plt count 171>78>75 over course of 5 days. Baseline is around 118. On lovenox SQ. Had a HIT workup 2 years ago negative. Not likely HIT given normal WBC and Hgb with absence of bleeding with VSS. Will need to monitor plts closely and consider stopping heparin if worsens. --Monitor plt ct, if worsens consider stopping heparin and perform HIT workup  #Chronic Small Vessel Ischemic Disease: MRI brain on 07/18/2016 with moderate-severe perisylvian and mesial temporal atrophy and Moderate-severe periventricular and subcortical and pontine chronic small vessel ischemic disease. He has been on Plavix. --We will continue home Plavix for now  #PAT/Heart Murmur: RRR now. She has 3/6 SEM over sternal borders left>right. EKG WNL 11/18/16. ECHO for cardiac murmur: EF 60-65%, G1DD, mild LVH, mild aortic stenosis. --Continue home Lopressor 12.5 BID --Hold home Diltiazem for now (BP somewhat low) --Telemetry  #Hypertension: Normotensive on admission, 140s/70s this AM. --Holding dilt while BP low --Continue home Lopressor 12.5 BID  #OSA: Doesn't use CPAP at ALF --Nightly CPAP  #Hypothyroidism: On Synthroid 112 MCG at home --Continue on Synthroid, repeat TSH >> 0.377 (no med change needed)  #Protein calorie malnutrition: Frail-looking patient --Consider nutrition consult when no longer NPO  #Insomnia: On clonazepam 2 mg and trazodone 100 mg at bedtime --Decrease clonazepam to 1 mg at bedtime --Trazodone 100 mg at bedtime  #?Dementia: Appears to be on Aricept 10 mg at home --Hold home Aricept for now  FEN/GI: clear diet,  IVF D5NS @125ml /hr, Protonix 40 mg daily  PPx: lovenox SQ  Disposition:  pending improvement of diarrhea and PO intake, labs pending  Subjective:  Patient laying in bed stating she had an episode of diarrhea and stool incontinence. C/o rash near perianal region due to the stool. States her RUQ continues to be painful intermittently and nauseous w/o vomiting.  Objective: Temp:  [98.9 F (37.2 C)-99.3 F (37.4 C)] 98.9 F (37.2 C) (01/05 0535) Pulse Rate:  [68-77] 71 (01/05 0535) Resp:  [18] 18 (01/05 0535) BP: (139-156)/(52-70) 144/70 (01/05 0535) SpO2:  [97 %-98 %] 97 % (01/05 0535) Physical Exam: General: elderly frail woman, well nourished, well developed, mild acute distress with palpation of abdomen with non-toxic appearance HEENT: normocephalic, atraumatic, moist mucous membranes CV: regular rate and rhythm without murmurs, rubs, or gallops Lungs: clear to auscultation bilaterally with normal work of breathing Abdomen: soft, ND, diffusely tender, worse at RUQ without rebound or guarding, no masses or organomegaly palpable, normoactive bowel sounds Skin: warm, dry, mildly erythematous around perianal region w/o skin breakdown, no lesions, cap refill < 2 seconds Extremities: warm and well perfused, normal tone  Laboratory: C-diff: negative UA: negative TSH: 0.377 Lactic acid: 0.63 Lipase: 66 Mg: 1.2>2.0  Recent Labs Lab 11/19/16 0404 11/20/16 0550 11/21/16 0618  WBC 4.8 4.4 4.2  HGB 11.4* 11.4* 12.4  HCT 34.7* 33.8* 36.4  PLT 101* 82* 78*    Recent Labs Lab 11/18/16 1143 11/19/16 0404 11/20/16 0550 11/21/16 0618 11/21/16 1621  NA 140 141 143 143 142  K 3.8 3.3* 2.7* 2.3* 2.8*  CL 97* 105 112* 105 104  CO2 24 20* 27 30 29   BUN 23* 13 8 <5* <5*  CREATININE 2.21* 1.86* 1.43* 1.21* 1.21*  CALCIUM 10.0 8.7* 8.9 8.8* 9.1  PROT 7.3 5.5*  --   --   --   BILITOT 1.3* 1.1  --   --   --   ALKPHOS 59 41  --   --   --   ALT 20 15  --   --   --   AST 25 20  --   --   --   GLUCOSE 77 57* 146* 114* 116*    Imaging/Diagnostic Tests: EKG:  Unremarkable  US Abdomen Complete (11/19/2016) FINDINGS: Gallbladder: Mild gallbladder wall thickening but no gallstones, pericholecystic fluid or sonographic Murphy sign to suggest acute cholecystitis. Common bile duct: Diameter: 4.1 mm Liver: Normal echogenicity without focal lesion or biliary dilatation. IVC: Normal caliber Pancreas: Sonographically unremarkable. Spleen: Normal size. No focal lesions. Small amount of perisplenic fluid. Right Kidney: Length: 9.1 cm. Increased echogenicity but normal renal cortical thickness. No renal lesions or hydronephrosis. Left Kidney: Length: 8.6 cm. Increased echogenicity but normal renal cortical thickness. No renal lesions or hydronephrosis. Abdominal aorta: Normal caliber Other findings: None.  IMPRESSION: Mild gallbladder wall thickening but no gallstones, pericholecystic fluid or sonographic Murphy sign. Normal caliber common bile duct and normal liver. Normal sonographic appearance of the pancreas, spleen and both kidneys except for mild medical renal disease.  DG Abd 1 View (11/20/2016) FINDINGS: Moderate scoliosis of the spine. Nonobstructive bowel gas pattern. No pathologic calcifications.  IMPRESSION: Nonobstructed bowel-gas pattern  Transthoracic Echocardiography (11/19/2016) Study Conclusions - Left ventricle: The cavity size was normal. Wall thickness was   increased in a pattern of mild LVH. Systolic function was normal.   The estimated ejection fraction was in the range of 60% to 65%.   Wall motion was normal; there were no regional  wall motion   abnormalities. Doppler parameters are consistent with abnormal   left ventricular relaxation (grade 1 diastolic dysfunction). - Aortic valve: Trileaflet; moderately calcified leaflets. There   was mild stenosis. Mean gradient (S): 12 mm Hg. - Mitral valve: Mildly calcified annulus. - Left atrium: The atrium was mildly dilated. - Right ventricle: The cavity size was normal.  Systolic function   was normal. - Pulmonary arteries: No complete TR doppler jet so unable to   estimate PA systolic pressure. - Inferior vena cava: The vessel was normal in size. The   respirophasic diameter changes were in the normal range (>= 50%),   consistent with normal central venous pressure.  Impressions: - Normal LV size with mild LV hypertrophy. EF 60-65%. Normal RV   size and systolic function. Mild aortic stenosis.    Wendee Beavers, DO 11/22/2016, 7:07 AM PGY-1, [facility] FPT[facility] Intern pager: [phone], text pages welcome

## 2016-11-22 NOTE — Care Management Important Message (Signed)
Important Message  Patient Details  Name: Rose FillersJudy C Frimpong MRN: [id number] Date of Birth: 11-11-1941   Medicare Important Message Given:  Yes    Karen Huhta 11/22/2016, 1:50 PM

## 2016-11-22 NOTE — Progress Notes (Signed)
Pharmacy Antibiotic Note  Ana FillersJudy C Foster is a 76 y.o. female admitted on 11/18/2016 with possible infective colitis.  Pharmacy has been consulted for Zosyn dosing. CT of abdomen shows wall thickening of descending colon.Afebrile, wbc wnl, SCr better at 13  Plan: Continue Zosyn 3.375 gm IV every 8 hours today with plans to transition to po antibiotics once patient is no longer NPO.  Pharmacy will sign off  Height: 5\' 2"  (157.5 cm) Weight: 135 lb (61.2 kg) IBW/kg (Calculated) : 50.1  Temp (24hrs), Avg:99.1 F (37.3 C), Min:98.9 F (37.2 C), Max:99.3 F (37.4 C)   Recent Labs Lab 11/18/16 1143 11/18/16 1201 11/19/16 0404 11/20/16 0550 11/21/16 0618 11/21/16 1621 11/22/16 0616  WBC 6.7  --  4.8 4.4 4.2  --  4.9  CREATININE 2.21*  --  1.86* 1.43* 1.21* 1.21* 1.00  LATICACIDVEN  --  0.63  --   --   --   --   --     Estimated Creatinine Clearance: 43.1 mL/min (by C-G formula based on SCr of 1 mg/dL).    Allergies  Allergen Reactions  . Vicodin [Hydrocodone-Acetaminophen] Nausea And Vomiting    Antimicrobials this admission:  Zosyn 1/2 >>   Dose adjustments this admission:  None  Microbiology results:  1/02 MRSA PCR: Neg  Thank you for allowing pharmacy to be a part of this patient's care.  [name], [name] 11/22/2016 12:51 PM

## 2016-11-22 NOTE — Progress Notes (Signed)
Initial Nutrition Assessment  DOCUMENTATION CODES:   Severe malnutrition in context of acute illness/injury  INTERVENTION:   -Boost Breeze po TID, each supplement provides 250 kcal and 9 grams of protein -Once diet is advanced, add Ensure Enlive po BID, each supplement provides 350 kcal and 20 grams of protein  NUTRITION DIAGNOSIS:   Malnutrition related to acute illness as evidenced by moderate depletion of body fat, moderate depletions of muscle mass, energy intake < or equal to 75% for > or equal to 1 month.  GOAL:   Patient will meet greater than or equal to 90% of their needs  MONITOR:   PO intake, Supplement acceptance, Diet advancement, Labs, Weight trends, Skin, I & O's  REASON FOR ASSESSMENT:   Consult Assessment of nutrition requirement/status  ASSESSMENT:   [name] is a 76 y.o. female presenting with headache and weakness after 4 days of emesis, now with headache and weakness. PMH is significant for OSA, HTN, CKD-3, PAF, hypothyroidism, malnutrition and restless leg syndrome.  Pt admitted with abdominal pain and diarrhea secondary to colitis.   Spoke with pt at bedside, who reports she "feels horrible". She shares she has had difficulty keeping food down for the past few weeks, but took her medications without difficulty today. Noted clear liquid tray was on tray table, unattempted. Per pt, she is afraid to eat due to inability to keep foods down.   Pt shares that she resides in an ALF and within the past few months her intake has progressively declined. She shares "I have an appetite, but once I sit down and look it at, I no longer have a taste for food". Pt also complains on taste alterations within the past few months.   Pt endorses wt loss. She estimates she was about 140# approximately one month ago, which is around her UBW. Pt estimates that she has lost a least 5 pounds. Noted prior wt of 135#, which wound reveals a 3.5% wt loss over the past month. Per  wt records, Noted a 21# wt loss over the past 4 days, so unsure if current wt is completely accurate.   Nutrition-Focused physical exam completed. Findings are mild to moderate fat depletion, mild to moderate. muscle depletion, and no edema. Pt uses a walker at baseline, however, states that she has become progressively weak.    Pt amenable to Boost Breeze supplements, but would prefer Ensure supplements once diet is advanced.   Labs reviewed.   Diet Order:  Diet clear liquid Room service appropriate? Yes; Fluid consistency: Thin  Skin:  Reviewed, no issues  Last BM:  11/21/16  Height:   Ht Readings from Last 1 Encounters:  11/18/16 5\' 2"  (1.575 m)    Weight:   Wt Readings from Last 1 Encounters:  11/22/16 114 lb 6.4 oz (51.9 kg)    Ideal Body Weight:  50 kg  BMI:  Body mass index is 20.92 kg/m.  Estimated Nutritional Needs:   Kcal:  1550-1750  Protein:  70-85 grams  Fluid:  >1.5 L  EDUCATION NEEDS:   Education needs addressed  Sharifah Champine A. Mayford KnifeWilliams, RD, LDN, CDE Pager: [phone] After hours Pager: [phone]

## 2016-11-23 DIAGNOSIS — R933 Abnormal findings on diagnostic imaging of other parts of digestive tract: Secondary | ICD-10-CM

## 2016-11-23 DIAGNOSIS — G43A1 Cyclical vomiting, intractable: Secondary | ICD-10-CM

## 2016-11-23 LAB — BASIC METABOLIC PANEL
ANION GAP: 4 — AB (ref 5–15)
BUN: <5 mg/dL — ABNORMAL LOW (ref 6–20)
CALCIUM: 9.6 mg/dL (ref 8.9–10.3)
CO2: 29 mmol/L (ref 22–32)
Chloride: 108 mmol/L (ref 101–111)
Creatinine, Ser: 1.11 mg/dL — ABNORMAL HIGH (ref 0.44–1.00)
GFR calc Af Amer: 56 mL/min — ABNORMAL LOW (ref >60)
GFR, EST NON AFRICAN AMERICAN: 48 mL/min — AB (ref >60)
Glucose, Bld: 100 mg/dL — ABNORMAL HIGH (ref 65–99)
POTASSIUM: 5.5 mmol/L — AB (ref 3.5–5.1)
SODIUM: 141 mmol/L (ref 135–145)

## 2016-11-23 LAB — CBC
HCT: 38.8 % (ref 36.0–46.0)
HEMOGLOBIN: 13.2 g/dL (ref 12.0–15.0)
MCH: 30.9 pg (ref 26.0–34.0)
MCHC: 34 g/dL (ref 30.0–36.0)
MCV: 90.9 fL (ref 78.0–100.0)
PLATELETS: 86 10*3/uL — AB (ref 150–400)
RBC: 4.27 MIL/uL (ref 3.87–5.11)
RDW: 13.5 % (ref 11.5–15.5)
WBC: 5.4 10*3/uL (ref 4.0–10.5)

## 2016-11-23 LAB — POTASSIUM
POTASSIUM: 5.8 mmol/L — AB (ref 3.5–5.1)
POTASSIUM: 4.6 mmol/L (ref 3.5–5.1)

## 2016-11-23 LAB — MAGNESIUM: MAGNESIUM: 2.1 mg/dL (ref 1.7–2.4)

## 2016-11-23 MED ORDER — ONDANSETRON HCL 4 MG/2ML IJ SOLN
4.0000 mg | Freq: Three times a day (TID) | INTRAMUSCULAR | Status: DC
  Administered 2016-11-23 – 2016-11-26 (×10): 4 mg via INTRAVENOUS
  Filled 2016-11-23 (×9): qty 2

## 2016-11-23 MED ORDER — DEXTROSE-NACL 5-0.45 % IV SOLN
INTRAVENOUS | Status: DC
Start: 2016-11-23 — End: 2016-11-25
  Administered 2016-11-23 – 2016-11-24 (×2): via INTRAVENOUS
  Filled 2016-11-23 (×3): qty 1000

## 2016-11-23 MED ORDER — TRAMADOL HCL 50 MG PO TABS
100.0000 mg | ORAL_TABLET | Freq: Four times a day (QID) | ORAL | Status: DC
  Administered 2016-11-23 – 2016-11-26 (×13): 100 mg via ORAL
  Filled 2016-11-23 (×14): qty 2

## 2016-11-23 MED ORDER — ACETAMINOPHEN 325 MG PO TABS
650.0000 mg | ORAL_TABLET | Freq: Four times a day (QID) | ORAL | Status: DC | PRN
  Administered 2016-11-23: 650 mg via ORAL
  Filled 2016-11-23: qty 2

## 2016-11-23 NOTE — Progress Notes (Signed)
Family Medicine Teaching Service Daily Progress Note Intern Pager: [phone]  Patient name: [name] Medical record number: [id number] Date of birth: 1941/09/03 Age: 76 y.o. Gender: female  Primary Care Provider: Pcp Not In System Consultants: none Code Status: DNR  Pt Overview and Major Events to Date:  01/02: patient admitted for HA and weakness in the setting of emesis and dehydration  Assessment and Plan: [name] is a 76 y.o. female presenting with headache and weakness after 4 days of emesis, now with headache and weakness. PMH is significant for OSA, HTN, CKD-3, PAF, hypothyroidism, malnutrition and restless leg syndrome.  #Abdominal Pain and Diarrhea/Emesis likely 2/2 Colitis: Patient continues to have nausea w/o vomiting and diarrhea. VSS and afebrile. CT abdomen yielded evidence of descending colon containing, small bowel lymphadenitis, and mesenteric stranding suggesting possible colitis. No evidence of obstruction or acute abdomen on exam. Diffuse tenderness, predominantly RUQ but abdominal US: mild gallbladder wall thickening without gallstones, pericholecystic fluid or sonographic Murphy sign. KUB negative. H/o c-sec, otherwise neg Surg Hx. Last colonoscopy in 2008, will need repeat as outpatient when over acute illness. C diff negative. Flu neg. GI panel neg. FOBT neg. Patient remains afebrile and has WBC WNL. --IV Zosyn 11/19/16 >> consider transition to PO Cipro/Flagyl once tolerating PO --F/u ova + parasite --Nutrition consult given GI losses --Daily weights --I&Os, including BMs --IVF D5NS @ 123ml/hr --Monitor for development of acute abdomen --Zofran PRN --Protonix 40 mg daily   --Tramadol 100 mg q6h PRN --Probiotics  #AKI on CKD-3: likely prerenal in the setting of dehydration 2/2 emesis. Serum creatinine 2.2 on admission. Baseline about 2.0. At 1.11 today. --IV fluid as above --Hold nephrotoxic medications  #Hypokalemia with Hypomagnesemia: likely 2/2  GI losses given patient has n/v, diarrhea. K elevated to 5.5 this AM. Mg found to be at 2.1. --Monitor BMP in AM  #Thrombocytopenia, Acute, Worsening: decrease in plt count 171>78>75 over course of 5 days. Baseline is around 118. On lovenox SQ. Had a HIT workup 2 years ago negative. Not likely HIT given normal WBC and Hgb with absence of bleeding with VSS. Will need to monitor plts closely and consider stopping heparin if worsens. --Monitor plt ct, if worsens consider stopping heparin and perform HIT workup  #Chronic Small Vessel Ischemic Disease: MRI brain on 07/18/2016 with moderate-severe perisylvian and mesial temporal atrophy and Moderate-severe periventricular and subcortical and pontine chronic small vessel ischemic disease. He has been on Plavix. --We will continue home Plavix for now  #PAT/Heart Murmur: RRR now. She has 3/6 SEM over sternal borders left>right. EKG WNL 11/18/16. ECHO for cardiac murmur: EF 60-65%, G1DD, mild LVH, mild aortic stenosis. --Continue home Lopressor 12.5 BID --Hold home Diltiazem for now (BP somewhat low) --Telemetry  #Hypertension: Normotensive on admission, 140s/70s this AM. --Holding dilt while BP low --Continue home Lopressor 12.5 BID  #OSA: Doesn't use CPAP at ALF --Nightly CPAP  #Hypothyroidism: On Synthroid 112 MCG at home --Continue on Synthroid, repeat TSH >> 0.377 (no med change needed)  #Protein calorie malnutrition: Frail-looking patient --Consider nutrition consult when no longer NPO  #Insomnia: On clonazepam 2 mg and trazodone 100 mg at bedtime --Decrease clonazepam to 1 mg at bedtime --Trazodone 100 mg at bedtime  #?Dementia: Appears to be on Aricept 10 mg at home --Hold home Aricept for now  FEN/GI: clear diet, IVF D5NS @125ml /hr, Protonix 40 mg daily  PPx: lovenox SQ  Disposition: pending improvement of diarrhea and PO intake, labs pending  Subjective:  Patient laying  in bed stating she had an episode of continues to  have diarrhea. Says he bottom is sore where the barrier cream is applied. Has some intermittent nausea but no vomiting. Denies fevers, chills, SOB, CP. Says she could attempt to advance diet today. Interested in getting out of hospital.  Objective: Temp:  [98.7 F (37.1 C)-99.1 F (37.3 C)] 98.7 F (37.1 C) (01/06 0612) Pulse Rate:  [77-79] 78 (01/06 0612) Resp:  [18] 18 (01/06 0612) BP: (113-154)/(58-82) 138/58 (01/06 0612) SpO2:  [98 %-99 %] 98 % (01/06 0612) Weight:  [114 lb (51.7 kg)-114 lb 6.4 oz (51.9 kg)] 114 lb (51.7 kg) (01/06 0612) Physical Exam: General: elderly frail woman, well nourished, well developed, no acute distress with non-toxic appearance HEENT: normocephalic, atraumatic, moist mucous membranes CV: regular rate and rhythm without murmurs, rubs, or gallops Lungs: clear to auscultation bilaterally with normal work of breathing Abdomen: soft, ND, diffusely tender, worse at RUQ without rebound or guarding, no masses or organomegaly palpable, normoactive bowel sounds Skin: warm, dry, mildly erythematous around perianal region w/o skin breakdown, no lesions, cap refill < 2 seconds Extremities: warm and well perfused, normal tone  Laboratory: C-diff: negative GI panel: negative FOBT: negative Flu: negative UA: negative TSH: 0.377 Lactic acid: 0.63 Lipase: 66 Mg: 1.2>2.0>2.1  Recent Labs Lab 11/20/16 0550 11/21/16 0618 11/22/16 0616  WBC 4.4 4.2 4.9  HGB 11.4* 12.4 12.9  HCT 33.8* 36.4 37.4  PLT 82* 78* 75*    Recent Labs Lab 11/18/16 1143 11/19/16 0404  11/22/16 0616 11/22/16 1512 11/23/16 0506  NA 140 141  < > 141 139 141  K 3.8 3.3*  < > 2.6* 3.6 5.5*  CL 97* 105  < > 106 103 108  CO2 24 20*  < > 27 27 29   BUN 23* 13  < > <5* <5* <5*  CREATININE 2.21* 1.86*  < > 1.00 1.15* 1.11*  CALCIUM 10.0 8.7*  < > 9.1 9.6 9.6  PROT 7.3 5.5*  --   --   --   --   BILITOT 1.3* 1.1  --   --   --   --   ALKPHOS 59 41  --   --   --   --   ALT 20 15  --   --    --   --   AST 25 20  --   --   --   --   GLUCOSE 77 57*  < > 100* 102* 100*  < > = values in this interval not displayed.  Imaging/Diagnostic Tests: EKG: Unremarkable  US Abdomen Complete (11/19/2016) FINDINGS: Gallbladder: Mild gallbladder wall thickening but no gallstones, pericholecystic fluid or sonographic Murphy sign to suggest acute cholecystitis. Common bile duct: Diameter: 4.1 mm Liver: Normal echogenicity without focal lesion or biliary dilatation. IVC: Normal caliber Pancreas: Sonographically unremarkable. Spleen: Normal size. No focal lesions. Small amount of perisplenic fluid. Right Kidney: Length: 9.1 cm. Increased echogenicity but normal renal cortical thickness. No renal lesions or hydronephrosis. Left Kidney: Length: 8.6 cm. Increased echogenicity but normal renal cortical thickness. No renal lesions or hydronephrosis. Abdominal aorta: Normal caliber Other findings: None.  IMPRESSION: Mild gallbladder wall thickening but no gallstones, pericholecystic fluid or sonographic Murphy sign. Normal caliber common bile duct and normal liver. Normal sonographic appearance of the pancreas, spleen and both kidneys except for mild medical renal disease.  DG Abd 1 View (11/20/2016) FINDINGS: Moderate scoliosis of the spine. Nonobstructive bowel gas pattern. No pathologic calcifications.  IMPRESSION:  Nonobstructed bowel-gas pattern  Transthoracic Echocardiography (11/19/2016) Study Conclusions - Left ventricle: The cavity size was normal. Wall thickness was   increased in a pattern of mild LVH. Systolic function was normal.   The estimated ejection fraction was in the range of 60% to 65%.   Wall motion was normal; there were no regional wall motion   abnormalities. Doppler parameters are consistent with abnormal   left ventricular relaxation (grade 1 diastolic dysfunction). - Aortic valve: Trileaflet; moderately calcified leaflets. There   was mild stenosis. Mean  gradient (S): 12 mm Hg. - Mitral valve: Mildly calcified annulus. - Left atrium: The atrium was mildly dilated. - Right ventricle: The cavity size was normal. Systolic function   was normal. - Pulmonary arteries: No complete TR doppler jet so unable to   estimate PA systolic pressure. - Inferior vena cava: The vessel was normal in size. The   respirophasic diameter changes were in the normal range (>= 50%),   consistent with normal central venous pressure.  Impressions: - Normal LV size with mild LV hypertrophy. EF 60-65%. Normal RV   size and systolic function. Mild aortic stenosis.    Wendee Beaversavid J McMullen, DO 11/23/2016, 7:29 AM PGY-1, [facility] FPTS Intern pager: [phone], text pages welcome

## 2016-11-23 NOTE — Consult Note (Addendum)
Consultation  Referring Provider:     Family Medicine Dr. Donnetta Hail team Primary Care Physician:  Pcp Not In System Primary Gastroenterologist:      ? Reason for Consultation:     Diarrhea, nausea and vomiting     Impression / Plan:   Persistent diarrhea, persistent nausea, cause not clear - infectious w/u negative - has ? Left-sided colitis on a CT w/o IV contrast  Headache and bodyaches that started after illness  thrombocytopenia    Plan for a diagnostic unprepped sigmoidoscopy tomorrow. The risks and benefits as well as alternatives of endoscopic procedure(s) have been discussed and reviewed. All questions answered. The patient agrees to proceed.  Make ondansetron running  I appreciate the opportunity to care for this patient. Iva Boop, MD, [facility] [phone] (pager) [phone] after 5 PM, weekends and holidays  11/23/2016 4:21 PM          HPI:   [name] is a 76 y.o. female admitted 1/1 with a sveral day hx of nausea and vomiting and diarrhea - watery. Developed headache and body aches after that. She has less vomiting but remains nauseated, and still having watery diarrhea. GI pathogen panel, hemoccult, O an P, C diff negative. Unenhanced CT as above. Apparently some similar illness present in assisted living center where she lives.  Has diffuse abdominal pain worse in lower quadrants - crampy with stools. Anal area is "on fire" C/o insomnia Tolerating sips of fluids Is on empiric zosyn Past Medical History:  Diagnosis Date  . Abnormal heart rhythm   . Abnormality of gait 12/21/2015  . Afib (HCC)    h/o  . Anxiety   . Arthritis    "hips, shoulders" (01/05/2015)  . Chronic kidney disease (CKD), stage III (moderate)    [name] 01/05/2015  . Dementia   . Depression   . Echocardiogram abnormal 02/26/11   MVP,mild MR,AOV mildly sclerotic. EF >55%  . GERD (gastroesophageal reflux disease)   . Hypertension   . Hypothyroidism     . Memory difficulty 06/05/2016  . Nausea & vomiting 11/2016  . Osteoporosis   . Reactive airway disease   . Scoliosis   . Sleep apnea    "suppose to wear a mask; can't tolerate it" (01/05/2015)    Past Surgical History:  Procedure Laterality Date  . CATARACT EXTRACTION, BILATERAL Bilateral   . CESAREAN SECTION  1967  . CESAREAN SECTION  1971  . DILATION AND CURETTAGE OF UTERUS    . JOINT REPLACEMENT    . OPEN REDUCTION INTERNAL FIXATION (ORIF) DISTAL RADIAL FRACTURE Left 12/2009   [name] 12/28/2009  . TOTAL KNEE ARTHROPLASTY Left 03/2010   [name] 04/07/2010  . TUBAL LIGATION    . VAGINAL HYSTERECTOMY  1980    Family History  Problem Relation Age of Onset  . Cancer Mother   . Heart disease Father   . Cancer Father     Social History  Substance Use Topics  . Smoking status: Never Smoker  . Smokeless tobacco: Never Used  . Alcohol use No    Prior to Admission medications   Medication Sig Start Date End Date Taking? Authorizing Provider  acetaminophen (TYLENOL) 500 MG tablet Take 1,000 mg by mouth every 6 (six) hours as needed for headache (minor discomfort (contact MD if headache or pain persists for more than 24 hours).   Yes Historical Provider, MD  baclofen (LIORESAL) 10 MG tablet Take 5 mg by mouth at bedtime as needed (  back spasms).    Yes Historical Provider, MD  budesonide-formoterol (SYMBICORT) 160-4.5 MCG/ACT inhaler Inhale 2 puffs into the lungs 2 (two) times daily. Rinse mouth after use - discard 3 months after opening   Yes Historical Provider, MD  calcitRIOL (ROCALTROL) 0.25 MCG capsule Take 0.25 mcg by mouth every Monday, Wednesday, and Friday. Do not crush   Yes Historical Provider, MD  clonazePAM (KLONOPIN) 2 MG tablet Take 1 tablet (2 mg total) by mouth at bedtime. 09/30/16  Yes Waymon Budgelinton D Young, MD  clopidogrel (PLAVIX) 75 MG tablet Take 1 tablet (75 mg total) by mouth daily. 06/05/16  Yes York Spanielharles K Willis, MD  diltiazem (CARDIZEM CD) 180 MG 24 hr capsule Take 1  capsule (180 mg total) by mouth daily. Patient taking differently: Take 180 mg by mouth daily. Do not crush 01/10/16  Yes Mihai Croitoru, MD  diltiazem (CARDIZEM) 30 MG tablet Take 30 mg by mouth at bedtime as needed (palpitations or tachycardia). Do not crush   Yes Historical Provider, MD  diphenhydrAMINE (BENADRYL) 25 MG tablet Take 25 mg by mouth every 4 (four) hours as needed (minor allergic reaction (notify MD if reaction persists more than 24 hours).   Yes Historical Provider, MD  donepezil (ARICEPT) 10 MG tablet Take 1 tablet (10 mg total) by mouth at bedtime. 08/14/16  Yes York Spanielharles K Willis, MD  levothyroxine (SYNTHROID, LEVOTHROID) 112 MCG tablet Take 112 mcg by mouth daily.    Yes Historical Provider, MD  loperamide (IMODIUM) 2 MG capsule Take 2 mg by mouth as needed for diarrhea or loose stools (maximum 8 doses in 24 hours).   Yes Historical Provider, MD  metoprolol tartrate (LOPRESSOR) 25 MG tablet Take 12.5 mg by mouth 2 (two) times daily.    Yes Historical Provider, MD  Omega-3 Fatty Acids (FISH OIL) 1000 MG CAPS Take 1,000 mg by mouth daily. Do not crush   Yes Historical Provider, MD  ondansetron (ZOFRAN) 4 MG tablet Take 4 mg by mouth every 8 (eight) hours as needed for nausea or vomiting.   Yes Historical Provider, MD  pantoprazole (PROTONIX) 40 MG tablet Take 40 mg by mouth daily. Do not crush   Yes Historical Provider, MD  polyethylene glycol (MIRALAX / GLYCOLAX) packet Take 17 g by mouth daily as needed (constipation). Mix in 8 oz of water and drink   Yes Historical Provider, MD  Probiotic Product (PROBIOTIC PO) Take 1 tablet by mouth 2 (two) times daily. Do not crush   Yes Historical Provider, MD  sertraline (ZOLOFT) 100 MG tablet Take 150 mg by mouth daily.   Yes Historical Provider, MD  traMADol (ULTRAM) 50 MG tablet Take 50 mg by mouth See admin instructions. Take 1 tablet (50 mg) by mouth 3 times daily, may also take 1 tablet every 8 hours as needed for pain   Yes Historical  Provider, MD  traZODone (DESYREL) 100 MG tablet Take 1 tablet (100 mg total) by mouth at bedtime. 09/30/16  Yes Waymon Budgelinton D Young, MD    Current Facility-Administered Medications  Medication Dose Route Frequency Provider Last Rate Last Dose  . acetaminophen (TYLENOL) tablet 650 mg  650 mg Oral Q6H PRN Almon Herculesaye T Gonfa, MD      . baclofen (LIORESAL) tablet 5 mg  5 mg Oral QHS PRN Almon Herculesaye T Gonfa, MD   5 mg at 11/19/16 2210  . calcitRIOL (ROCALTROL) capsule 0.25 mcg  0.25 mcg Oral Q M,W,F Almon Herculesaye T Gonfa, MD   0.25 mcg at 11/22/16 1009  .  clonazePAM (KLONOPIN) tablet 1 mg  1 mg Oral QHS Almon Hercules, MD   1 mg at 11/22/16 2239  . clopidogrel (PLAVIX) tablet 75 mg  75 mg Oral Daily Almon Hercules, MD   75 mg at 11/23/16 0932  . dextrose 5 % and 0.45% NaCl 1,000 mL infusion   Intravenous Continuous Almon Hercules, MD 50 mL/hr at 11/23/16 1047    . feeding supplement (BOOST / RESOURCE BREEZE) liquid 1 Container  1 Container Oral TID BM Nestor Ramp, MD   1 Container at 11/22/16 1950  . Gerhardt's butt cream   Topical QID PRN Nestor Ramp, MD      . heparin injection 5,000 Units  5,000 Units Subcutaneous Q8H Almon Hercules, MD   5,000 Units at 11/23/16 1355  . lactobacillus acidophilus (BACID) tablet 2 tablet  2 tablet Oral TID Lovena Neighbours, MD   2 tablet at 11/23/16 1605  . levothyroxine (SYNTHROID, LEVOTHROID) tablet 112 mcg  112 mcg Oral QAC breakfast Almon Hercules, MD   112 mcg at 11/23/16 0827  . MEDLINE mouth rinse  15 mL Mouth Rinse BID Nestor Ramp, MD   15 mL at 11/22/16 2247  . metoprolol tartrate (LOPRESSOR) tablet 12.5 mg  12.5 mg Oral BID Almon Hercules, MD   12.5 mg at 11/23/16 0932  . ondansetron (ZOFRAN) tablet 4 mg  4 mg Oral Q6H PRN Almon Hercules, MD   4 mg at 11/19/16 0811   Or  . ondansetron (ZOFRAN) injection 4 mg  4 mg Intravenous Q6H PRN Almon Hercules, MD   4 mg at 11/22/16 1404  . pantoprazole (PROTONIX) EC tablet 40 mg  40 mg Oral Daily Almon Hercules, MD   40 mg at 11/23/16 0932  . sertraline  (ZOLOFT) tablet 150 mg  150 mg Oral Daily Almon Hercules, MD   150 mg at 11/23/16 0932  . traMADol (ULTRAM) tablet 100 mg  100 mg Oral Q6H Almon Hercules, MD   100 mg at 11/23/16 1355  . traZODone (DESYREL) tablet 100 mg  100 mg Oral QHS Almon Hercules, MD   100 mg at 11/22/16 2240    Allergies as of 11/18/2016 - Review Complete 11/18/2016  Allergen Reaction Noted  . Vicodin [hydrocodone-acetaminophen] Nausea And Vomiting 10/01/2015     Review of Systems:    This is positive for those things mentioned in the HPI, also positive for memry difficulty, diffusely weak All other review of systems are negative.       Physical Exam:  Vital signs in last 24 hours: Temp:  [98.7 F (37.1 C)-99 F (37.2 C)] 98.8 F (37.1 C) (01/06 1310) Pulse Rate:  [78-79] 78 (01/06 1310) Resp:  [18] 18 (01/06 0612) BP: (138-154)/(58-76) 141/72 (01/06 1310) SpO2:  [98 %-100 %] 100 % (01/06 1310) Weight:  [114 lb (51.7 kg)] 114 lb (51.7 kg) (01/06 0612) Last BM Date: 11/23/16  General:  Well-developed, well-nourished and in no acute distress Eyes:  anicteric. ENT:   Mouth and posterior pharynx free of lesions.  Neck:   supple w/o thyromegaly or mass.  Lungs: Clear to auscultation bilaterally. Heart:  S1S2, no rubs, murmurs, gallops. Abdomen:  soft, non-tender, no hepatosplenomegaly, hernia, or mass and BS+.  Rectal: deferred Lymph:  no cervical or supraclavicular adenopathy. Extremities:   no edema Skin   no rash. Neuro:  A&O x 3.  Psych:  appropriate mood and  Affect.   Data Reviewed:  LAB RESULTS:  Recent Labs  11/21/16 0618 11/22/16 0616 11/23/16 0904  WBC 4.2 4.9 5.4  HGB 12.4 12.9 13.2  HCT 36.4 37.4 38.8  PLT 78* 75* 86*   BMET  Recent Labs  11/22/16 0616 11/22/16 1512 11/23/16 0506 11/23/16 1119  NA 141 139 141  --   K 2.6* 3.6 5.5* 5.8*  CL 106 103 108  --   CO2 27 27 29   --   GLUCOSE 100* 102* 100*  --   BUN <5* <5* <5*  --   CREATININE 1.00 1.15* 1.11*  --     CALCIUM 9.1 9.6 9.6  --      PREVIOUS ENDOSCOPIES:            ? Had in past   Thanks   LOS: 5 days   @Clemmie Marxen  Sena Slate, MD, [facility] @  11/23/2016, 4:19 PM

## 2016-11-24 ENCOUNTER — Encounter (HOSPITAL_COMMUNITY): Payer: Self-pay

## 2016-11-24 ENCOUNTER — Encounter (HOSPITAL_COMMUNITY)
Admission: EM | Disposition: A | Discharge status: Home Health | Payer: Self-pay | Source: Home / Self Care | Attending: Family Medicine

## 2016-11-24 HISTORY — PX: FLEXIBLE SIGMOIDOSCOPY: SHX5431

## 2016-11-24 LAB — CBC
HCT: 37.7 % (ref 36.0–46.0)
Hemoglobin: 12.5 g/dL (ref 12.0–15.0)
MCH: 30.7 pg (ref 26.0–34.0)
MCHC: 33.2 g/dL (ref 30.0–36.0)
MCV: 92.6 fL (ref 78.0–100.0)
PLATELETS: 85 10*3/uL — AB (ref 150–400)
RBC: 4.07 MIL/uL (ref 3.87–5.11)
RDW: 13.7 % (ref 11.5–15.5)
WBC: 5.4 10*3/uL (ref 4.0–10.5)

## 2016-11-24 LAB — BASIC METABOLIC PANEL
Anion gap: 7 (ref 5–15)
BUN: <5 mg/dL — ABNORMAL LOW (ref 6–20)
CALCIUM: 10.1 mg/dL (ref 8.9–10.3)
CHLORIDE: 104 mmol/L (ref 101–111)
CO2: 27 mmol/L (ref 22–32)
CREATININE: 1.27 mg/dL — AB (ref 0.44–1.00)
GFR, EST AFRICAN AMERICAN: 47 mL/min — AB (ref >60)
GFR, EST NON AFRICAN AMERICAN: 41 mL/min — AB (ref >60)
Glucose, Bld: 93 mg/dL (ref 65–99)
Potassium: 4.1 mmol/L (ref 3.5–5.1)
SODIUM: 138 mmol/L (ref 135–145)

## 2016-11-24 SURGERY — SIGMOIDOSCOPY, FLEXIBLE
Anesthesia: Moderate Sedation

## 2016-11-24 MED ORDER — ZINC OXIDE 12.8 % EX OINT
TOPICAL_OINTMENT | Freq: Three times a day (TID) | CUTANEOUS | Status: DC
  Administered 2016-11-24 (×3): via TOPICAL
  Administered 2016-11-25: 1 via TOPICAL
  Administered 2016-11-25 – 2016-11-26 (×5): via TOPICAL
  Filled 2016-11-24: qty 56.7

## 2016-11-24 MED ORDER — FENTANYL CITRATE (PF) 100 MCG/2ML IJ SOLN
INTRAMUSCULAR | Status: DC | PRN
  Administered 2016-11-24 (×2): 25 ug via INTRAVENOUS
  Administered 2016-11-24: 12.5 ug via INTRAVENOUS

## 2016-11-24 MED ORDER — MIDAZOLAM HCL 10 MG/2ML IJ SOLN
INTRAMUSCULAR | Status: DC | PRN
  Administered 2016-11-24 (×2): 1 mg via INTRAVENOUS
  Administered 2016-11-24 (×2): 2 mg via INTRAVENOUS

## 2016-11-24 MED ORDER — DIPHENOXYLATE-ATROPINE 2.5-0.025 MG PO TABS
1.0000 | ORAL_TABLET | Freq: Three times a day (TID) | ORAL | Status: DC
  Administered 2016-11-24 – 2016-11-25 (×5): 1 via ORAL
  Filled 2016-11-24 (×5): qty 1

## 2016-11-24 MED ORDER — MIDAZOLAM HCL 5 MG/ML IJ SOLN
INTRAMUSCULAR | Status: AC
  Filled 2016-11-24: qty 2

## 2016-11-24 MED ORDER — FENTANYL CITRATE (PF) 100 MCG/2ML IJ SOLN
INTRAMUSCULAR | Status: AC
  Filled 2016-11-24: qty 2

## 2016-11-24 NOTE — Brief Op Note (Addendum)
11/18/2016 - 11/24/2016  9:06 AM  PATIENT:  Ana FillersJudy C Walts  76 y.o. female  PRE-OPERATIVE DIAGNOSIS:  diarrhea, ? colitis  POST-OPERATIVE DIAGNOSIS:  Random left colon Bx to r/o colitis  PROCEDURE:  Procedure(s): FLEXIBLE SIGMOIDOSCOPY (N/A)  SURGEON:  Surgeon(s) and Role:    * Iva Booparl E Gessner, MD - Primary   ANESTHESIA:   IV sedation Fentanyl 62.5 ug and versed 6 mg 14 mins  Findings:  1) Perianal dermatitis 2) Patchy loss of vascular pattern - unprepped - good views random bxs taken - exam to descending colon 3) narrow rectal vault no retroflexion  Rec:  1) Schedule Lomotil tidac/hs 2) await biopsies 3) infection seems excluded so do not think needs enteric precautions 4) continue butt cream but scheduled qid

## 2016-11-24 NOTE — Progress Notes (Signed)
Family Medicine Teaching Service Daily Progress Note Intern Pager: [phone]  Patient name: [name] Medical record number: [id number] Date of birth: December 06, 1940 Age: 76 y.o. Gender: female  Primary Care Provider: Pcp Not In System Consultants: none Code Status: DNR  Pt Overview and Major Events to Date:  01/02: patient admitted for HA and weakness in the setting of emesis and dehydration  Assessment and Plan: [name] is a 76 y.o. female presenting with headache and weakness after 4 days of emesis, now with headache and weakness. PMH is significant for OSA, HTN, CKD-3, PAF, hypothyroidism, malnutrition and restless leg syndrome.  # Abdominal Pain and Diarrhea/Emesis likely 2/2 Colitis: Patient continues to have nausea w/o vomiting.  Her diarrhea is improving.  No leukocytosis and afebrile. CT abdomen yielded evidence of descending colon containing, small bowel lymphadenitis, and mesenteric stranding suggesting possible colitis. No evidence of obstruction or acute abdomen on exam. She has generalized abdominal tenderness, though seems to be increased at RUQ.  Abdominal US: mild gallbladder wall thickening without gallstones, pericholecystic fluid or sonographic Murphy sign. KUB negative.  Last colonoscopy in 2008, will need repeat as outpatient when over acute illness. C diff negative. Flu neg. GI panel neg. FOBT neg.  - IV Zosyn 11/19/16 > 11/23/16.  - fecal ova + parasite pending - Nutrition consult, appreciate recs - c/s GI: s/p sigmoidoscopy 1/7. Perianal dermatitis.  Biopsies obtained.  Lomotil scheduled.  Infection excluded, so enteric precautions and abx discontinued.  Topical cream to rectum scheduled. - Daily weights, 3 lb weight loss this admission. - I&Os, including BMs - IVF D5NS @ 50ml/hr.  Will plan to discontinue this if patient is tolerating meals this evening. - Advance diet to regular.   - Monitor for development of acute abdomen - Zofran PRN - Protonix 40 mg daily    - Tramadol 100 mg q6h PRN - Probiotics  # AKI on CKD-3: likely prerenal in the setting of dehydration 2/2 emesis. Serum creatinine 2.2 on admission. Baseline about 2.0. At 1.28 today. - IV fluid as above  - Hold nephrotoxic medications  # Hypokalemia with Hypomagnesemia, resolved:  - Monitor BMP   # Thrombocytopenia, Acute, Worsening: decrease in plt count 171>> 85.  Baseline is around 118. On lovenox SQ. Had a HIT workup 2 years ago negative. Not likely HIT given normal WBC and Hgb with absence of bleeding with VSS. Will need to monitor plts closely and consider stopping heparin if worsens. - Monitor plt ct, if worsens consider stopping heparin and perform HIT workup  # Chronic Small Vessel Ischemic Disease: MRI brain on 07/18/2016 with moderate-severe perisylvian and mesial temporal atrophy and Moderate-severe periventricular and subcortical and pontine chronic small vessel ischemic disease. He has been on Plavix. - We will continue home Plavix for now  # PAT/Heart Murmur: RRR now. She has 3/6 SEM over sternal borders left>right. EKG WNL 11/18/16. ECHO for cardiac murmur: EF 60-65%, G1DD, mild LVH, mild aortic stenosis. - Continue home Lopressor 12.5 BID - Hold home Diltiazem for now (BP somewhat low) - Telemetry  # Hypertension: 125/56 this AM. - Holding dilt while BP low - Continue home Lopressor 12.5 BID  # OSA: Doesn't use CPAP at ALF - Nightly CPAP  # Hypothyroidism: On Synthroid 112 MCG at home - Continue on Synthroid, repeat TSH >> 0.377 (no med change needed)  # Protein calorie malnutrition: Frail-looking patient - Nutrition consult  # Insomnia: On clonazepam 2 mg and trazodone 100 mg at bedtime - Decrease clonazepam  to 1 mg at bedtime - Trazodone 100 mg at bedtime  # ?Dementia: Appears to be on Aricept 10 mg at home - Hold home Aricept for now  FEN/GI: clear diet, IVF D5NS @125ml /hr, Protonix 40 mg daily  PPx: lovenox SQ  Disposition: pending improvement  of diarrhea and PO intake.  Will PO challenge.  If she is able to tolerate PO, will plan for possible d/c tomorrow 1/8.  Subjective:  Patient reports that her bottom is still painful.  She notes nausea with meals.  She wants to try and eat something more substantial though.  Endorses intermittent headache.  Diarrhea is improving.  Denies fevers, cough, SOB, CP.  Objective: Temp:  [97.3 F (36.3 C)-98.8 F (37.1 C)] 97.3 F (36.3 C) (01/07 0904) Pulse Rate:  [70-84] 70 (01/07 0904) Resp:  [15-22] 22 (01/07 0904) BP: (118-154)/(55-87) 154/55 (01/07 0904) SpO2:  [96 %-100 %] 99 % (01/07 0904) Weight:  [111 lb 15.9 oz (50.8 kg)] 111 lb 15.9 oz (50.8 kg) (01/07 0610) Physical Exam: General: elderly frail woman, well nourished, well developed, no acute distress with non-toxic appearance HEENT: normocephalic, atraumatic, moist mucous membranes CV: regular rate and rhythm without murmurs, rubs, or gallops Lungs: clear to auscultation bilaterally with normal work of breathing on room air Abdomen: soft, ND, generalized TTP that is worse at RUQ without rebound or guarding, no masses or organomegaly palpable, normoactive bowel sounds Skin: warm, dry, moderately erythematous around perianal region w/o skin breakdown, no lesions, cap refill < 2 seconds Extremities: warm and well perfused, normal tone  Laboratory: C-diff: negative GI panel: negative FOBT: negative Flu: negative UA: negative TSH: 0.377 Lactic acid: 0.63 Lipase: 66 Mg: 1.2>2.0>2.1  Recent Labs Lab 11/22/16 0616 11/23/16 0904 11/24/16 0723  WBC 4.9 5.4 5.4  HGB 12.9 13.2 12.5  HCT 37.4 38.8 37.7  PLT 75* 86* 85*    Recent Labs Lab 11/18/16 1143 11/19/16 0404  11/22/16 1512 11/23/16 0506 11/23/16 1119 11/23/16 1952 11/24/16 0723  NA 140 141  < > 139 141  --   --  138  K 3.8 3.3*  < > 3.6 5.5* 5.8* 4.6 4.1  CL 97* 105  < > 103 108  --   --  104  CO2 24 20*  < > 27 29  --   --  27  BUN 23* 13  < > <5* <5*  --    --  <5*  CREATININE 2.21* 1.86*  < > 1.15* 1.11*  --   --  1.27*  CALCIUM 10.0 8.7*  < > 9.6 9.6  --   --  10.1  PROT 7.3 5.5*  --   --   --   --   --   --   BILITOT 1.3* 1.1  --   --   --   --   --   --   ALKPHOS 59 41  --   --   --   --   --   --   ALT 20 15  --   --   --   --   --   --   AST 25 20  --   --   --   --   --   --   GLUCOSE 77 57*  < > 102* 100*  --   --  93  < > = values in this interval not displayed.  Imaging/Diagnostic Tests: EKG: Unremarkable  US Abdomen Complete (11/19/2016) FINDINGS: Gallbladder: Mild gallbladder wall  thickening but no gallstones, pericholecystic fluid or sonographic Murphy sign to suggest acute cholecystitis. Common bile duct: Diameter: 4.1 mm Liver: Normal echogenicity without focal lesion or biliary dilatation. IVC: Normal caliber Pancreas: Sonographically unremarkable. Spleen: Normal size. No focal lesions. Small amount of perisplenic fluid. Right Kidney: Length: 9.1 cm. Increased echogenicity but normal renal cortical thickness. No renal lesions or hydronephrosis. Left Kidney: Length: 8.6 cm. Increased echogenicity but normal renal cortical thickness. No renal lesions or hydronephrosis. Abdominal aorta: Normal caliber Other findings: None.  IMPRESSION: Mild gallbladder wall thickening but no gallstones, pericholecystic fluid or sonographic Murphy sign. Normal caliber common bile duct and normal liver. Normal sonographic appearance of the pancreas, spleen and both kidneys except for mild medical renal disease.  DG Abd 1 View (11/20/2016) FINDINGS: Moderate scoliosis of the spine. Nonobstructive bowel gas pattern. No pathologic calcifications.  IMPRESSION: Nonobstructed bowel-gas pattern  Transthoracic Echocardiography (11/19/2016) Study Conclusions - Left ventricle: The cavity size was normal. Wall thickness was   increased in a pattern of mild LVH. Systolic function was normal.   The estimated ejection fraction was in the range  of 60% to 65%.   Wall motion was normal; there were no regional wall motion   abnormalities. Doppler parameters are consistent with abnormal   left ventricular relaxation (grade 1 diastolic dysfunction). - Aortic valve: Trileaflet; moderately calcified leaflets. There   was mild stenosis. Mean gradient (S): 12 mm Hg. - Mitral valve: Mildly calcified annulus. - Left atrium: The atrium was mildly dilated. - Right ventricle: The cavity size was normal. Systolic function   was normal. - Pulmonary arteries: No complete TR doppler jet so unable to   estimate PA systolic pressure. - Inferior vena cava: The vessel was normal in size. The   respirophasic diameter changes were in the normal range (>= 50%),   consistent with normal central venous pressure.  Impressions: - Normal LV size with mild LV hypertrophy. EF 60-65%. Normal RV   size and systolic function. Mild aortic stenosis.  Raliegh Ip, DO 11/24/2016, 9:44 AM PGY-3, [facility] FPTS Intern pager: [phone], text pages welcome

## 2016-11-24 NOTE — Op Note (Signed)
[facility] Patient Name: [name] Procedure Date : 11/24/2016 MRN: [id number] Attending MD: Iva Boop , MD Date of Birth: [birth year] CSN: [id number] Age: 76 Admit Type: Inpatient Procedure:                Flexible Sigmoidoscopy Indications:              Diarrhea Providers:                Iva Boop, MD, Waynard Edwards RN, RN, Kandice Robinsons, Technician Referring MD:              Medicines:                Fentanyl 62.5 micrograms IV, Midazolam 6 mg IV Complications:            No immediate complications. Estimated Blood Loss:     Estimated blood loss was minimal. Procedure:                Pre-Anesthesia Assessment:                           - Prior to the procedure, a History and Physical                            was performed, and patient medications and                            allergies were reviewed. The patient's tolerance of                            previous anesthesia was also reviewed. The risks                            and benefits of the procedure and the sedation                            options and risks were discussed with the patient.                            All questions were answered, and informed consent                            was obtained. Prior Anticoagulants: The patient                            last took Plavix (clopidogrel) 6 days prior to the                            procedure. ASA Grade Assessment: III - A patient                            with severe systemic disease. After reviewing the  risks and benefits, the patient was deemed in                            satisfactory condition to undergo the procedure.                           After obtaining informed consent, the scope was                            passed under direct vision. The EC-[id number] ([id number])                            scope was introduced through the anus and advanced     to the the descending colon. The flexible                            sigmoidoscopy was accomplished without difficulty.                            The patient tolerated the procedure fairly well.                            The quality of the bowel preparation was adequate. Scope In: 8:52:48 AM Scope Out: 9:00:30 AM Total Procedure Duration: 0 hours 7 minutes 42 seconds  Findings:      The perianal exam findings include a perianal fungal rash.      The digital rectal exam findings include rectal tenderness.      A patchy area of mildly vascular-pattern-decreased mucosa was found in       the sigmoid colon and in the descending colon. Biopsies were taken with       a cold forceps for histology. Verification of patient identification for       the specimen was done. Estimated blood loss was minimal.      The exam was otherwise without abnormality.Rectal retroflex not done -       narrow vault Impression:               - Perianal fungal rash found on perianal exam.                           - Rectal tenderness found on digital rectal exam.                           - Vascular-pattern-decreased mucosa in the sigmoid                            colon and in the descending colon. Biopsied.                           - The examination was otherwise normal. Moderate Sedation:      Moderate (conscious) sedation was administered by the endoscopy nurse       and supervised by the endoscopist. The following parameters were       monitored: oxygen saturation, heart rate, blood pressure, respiratory       rate, EKG, adequacy of pulmonary ventilation,  and response to care.       Total physician intraservice time was 14 minutes. Recommendation:           - Return patient to hospital ward for ongoing care.                           - No clear cause of diarrhea seen - await bxs                           start scheduled lomotil                           ? post infection IBS, microscopic colitis                            Dr. Elnoria Howard or Loreta Ave will f/u Procedure Code(s):        --- Professional ---                           [phone], Sigmoidoscopy, flexible; with biopsy, single                            or multiple                           G0500, Moderate sedation services provided by the                            same physician or other qualified health care                            professional performing a gastrointestinal                            endoscopic service that sedation supports,                            requiring the presence of an independent trained                            observer to assist in the monitoring of the                            patient's level of consciousness and physiological                            status; initial 15 minutes of intra-service time;                            patient age 55 years or older (additional time may                            be reported with 60454, as appropriate) Diagnosis Code(s):        --- Professional ---  B35.6, Tinea cruris                           K62.89, Other specified diseases of anus and rectum                           K63.89, Other specified diseases of intestine                           R19.7, Diarrhea, unspecified CPT copyright 2016 American Medical Association. All rights reserved. The codes documented in this report are preliminary and upon coder review may  be revised to meet current compliance requirements. Iva Booparl E Mayari Matus, MD 11/24/2016 9:53:48 AM This report has been signed electronically. Number of Addenda: 0

## 2016-11-25 ENCOUNTER — Encounter (HOSPITAL_COMMUNITY): Payer: Self-pay | Admitting: Internal Medicine

## 2016-11-25 LAB — BASIC METABOLIC PANEL
Anion gap: 7 (ref 5–15)
BUN: 6 mg/dL (ref 6–20)
CHLORIDE: 105 mmol/L (ref 101–111)
CO2: 29 mmol/L (ref 22–32)
CREATININE: 1.59 mg/dL — AB (ref 0.44–1.00)
Calcium: 10 mg/dL (ref 8.9–10.3)
GFR calc Af Amer: 36 mL/min — ABNORMAL LOW (ref >60)
GFR calc non Af Amer: 31 mL/min — ABNORMAL LOW (ref >60)
Glucose, Bld: 79 mg/dL (ref 65–99)
POTASSIUM: 4.6 mmol/L (ref 3.5–5.1)
Sodium: 141 mmol/L (ref 135–145)

## 2016-11-25 LAB — CBC
HEMATOCRIT: 35.2 % — AB (ref 36.0–46.0)
HEMOGLOBIN: 11.6 g/dL — AB (ref 12.0–15.0)
MCH: 30.7 pg (ref 26.0–34.0)
MCHC: 33 g/dL (ref 30.0–36.0)
MCV: 93.1 fL (ref 78.0–100.0)
Platelets: 92 10*3/uL — ABNORMAL LOW (ref 150–400)
RBC: 3.78 MIL/uL — AB (ref 3.87–5.11)
RDW: 14.1 % (ref 11.5–15.5)
WBC: 5.3 10*3/uL (ref 4.0–10.5)

## 2016-11-25 MED ORDER — DEXTROSE-NACL 5-0.45 % IV SOLN
INTRAVENOUS | Status: DC
  Administered 2016-11-25 – 2016-11-26 (×3): via INTRAVENOUS

## 2016-11-25 MED ORDER — DIPHENOXYLATE-ATROPINE 2.5-0.025 MG PO TABS
2.0000 | ORAL_TABLET | Freq: Three times a day (TID) | ORAL | Status: DC
  Administered 2016-11-25 – 2016-11-26 (×4): 2 via ORAL
  Filled 2016-11-25 (×4): qty 2

## 2016-11-25 MED ORDER — ENSURE ENLIVE PO LIQD
237.0000 mL | Freq: Three times a day (TID) | ORAL | Status: DC
  Administered 2016-11-25 – 2016-11-26 (×4): 237 mL via ORAL

## 2016-11-25 NOTE — Evaluation (Signed)
Occupational Therapy Evaluation Patient Details Name: [name] MRN: [id number] DOB: December 14, 1940 Today's Date: 11/25/2016    History of Present Illness [name] a 76 y.o.femalepresenting with headache and weakness after 4 days of emesis, now with headache and weakness. PMH is significant for OSA, HTN, CKD-3, PAF, hypothyroidism, malnutrition andrestless leg syndrome.   Clinical Impression   Patient presenting with decreased I in self care, balance, functional transfers/mobility, and safety awareness  Patient reports being mod I with mobility and ADLs at assisted living PTA. Patient currently functioning min - mod A with RW secondary to LOB. Patient will benefit from acute OT to increase overall independence in the areas of ADLs, functional mobility, and safety awareness in order to safely discharge.     Follow Up Recommendations  Home health OT;Supervision/Assistance - 24 hour. Pt wishes to return to assisted living but unsure if they are able to provide her with equipment and assistance needed for safety.   Equipment Recommendations  None recommended by OT    Recommendations for Other Services       Precautions / Restrictions Precautions Precautions: Fall Restrictions Weight Bearing Restrictions: No      Mobility Bed Mobility Overal bed mobility: Modified Independent        Transfers Overall transfer level: Needs assistance Equipment used: Rolling walker (2 wheeled) Transfers: Sit to/from Stand Sit to Stand: Min assist;Mod assist         General transfer comment: Pt lost balance posterior once she stood up needing mod assist to recover.  Pt generally unsteady on her feet initially.     Balance Overall balance assessment: Needs assistance;History of Falls Sitting-balance support: No upper extremity supported;Feet supported Sitting balance-Leahy Scale: Good     Standing balance support: Bilateral upper extremity supported;During functional  activity Standing balance-Leahy Scale: Poor Standing balance comment: relies on bil UE support for balance.               High level balance activites: Direction changes;Turns;Sudden stops High Level Balance Comments: Needed min assist for baalnce with challenges as she loses balance with challenges.             ADL Overall ADL's : Needs assistance/impaired     General ADL Comments: Pt performed sit <>stand from bed with RW and mod lifting assistance. Pt ambulating 10' with RW and min A into bathroom for toileting. Pt needing steady assistance for balance during clothing management and hygiene. Pt ambulates to sink to wash hands with steady assistance as pt's B UE's are unsupported. Once returning to sit on EOB, pt dons briefs with assistance to thread L LE into pants and multiple LOB posteriorly when trying to pull over B hips needing mod A to correct.                Pertinent Vitals/Pain Pain Assessment: No/denies pain     Hand Dominance Right   Extremity/Trunk Assessment Upper Extremity Assessment Upper Extremity Assessment: Generalized weakness   Lower Extremity Assessment Lower Extremity Assessment: Defer to PT evaluation   Cervical / Trunk Assessment Cervical / Trunk Assessment: Kyphotic   Communication Communication Communication: No difficulties   Cognition Arousal/Alertness: Awake/alert Behavior During Therapy: WFL for tasks assessed/performed Overall Cognitive Status: Within Functional Limits for tasks assessed            Exercises Exercises: General Lower Extremity          Home Living Family/patient expects to be discharged to:: Assisted living Living Arrangements: Alone Available  Help at Discharge: Family;Available PRN/intermittently Type of Home: Assisted living Home Access: Level entry     Home Layout: One level     Bathroom Shower/Tub: Producer, television/film/videoWalk-in shower   Bathroom Toilet: Handicapped height Bathroom Accessibility: Yes   Home  Equipment: Cane - single point;Walker - 2 wheels;Walker - 4 wheels;Bedside commode;Shower seat          Prior Functioning/Environment Level of Independence: Independent with assistive device(s)        Comments: Used RW at all times with Modif I.  Walked entire facility per pt.         OT Problem List: Decreased strength;Decreased activity tolerance;Impaired balance (sitting and/or standing);Decreased safety awareness;Pain;Decreased knowledge of use of DME or AE   OT Treatment/Interventions: Self-care/ADL training;Therapeutic exercise;Energy conservation;Therapeutic activities;Manual therapy;Patient/family education;Balance training;DME and/or AE instruction    OT Goals(Current goals can be found in the care plan section) Acute Rehab OT Goals Patient Stated Goal: To go back to A living OT Goal Formulation: With patient/family Time For Goal Achievement: 12/09/16 Potential to Achieve Goals: Fair ADL Goals Pt Will Perform Grooming: with supervision Pt Will Perform Upper Body Bathing: with supervision Pt Will Perform Lower Body Bathing: with min guard assist Pt Will Perform Upper Body Dressing: with supervision;with set-up Pt Will Perform Lower Body Dressing: with min guard assist Pt Will Transfer to Toilet: with min guard assist Pt Will Perform Toileting - Clothing Manipulation and hygiene: with min guard assist Pt Will Perform Tub/Shower Transfer: with min guard assist  OT Frequency: Min 3X/week   Barriers to D/C:    requirement of assisted living          End of Session Equipment Utilized During Treatment: Rolling walker  Activity Tolerance: Patient tolerated treatment well Patient left: in bed;with call bell/phone within reach;with family/visitor present   Time: [id number] OT Time Calculation (min): 24 min Charges:  OT General Charges $OT Visit: 1 Procedure OT Evaluation $OT Eval Moderate Complexity: 1 Procedure OT Treatments $Self Care/Home Management : 8-22  mins G-Codes:    Alen BleacherBradsher, Angla Delahunt P, MS, OTR/L, CBIS 11/25/2016, 3:48 PM

## 2016-11-25 NOTE — Progress Notes (Signed)
Nutrition Follow-up  DOCUMENTATION CODES:   Severe malnutrition in context of acute illness/injury  INTERVENTION:   -D/c Boost Breeze po TID, each supplement provides 250 kcal and 9 grams of protein -Ensure Enlive po TID, each supplement provides 350 kcal and 20 grams of protein  NUTRITION DIAGNOSIS:   Malnutrition related to acute illness as evidenced by moderate depletion of body fat, moderate depletions of muscle mass, energy intake < or equal to 75% for > or equal to 1 month.  Ongoing  GOAL:   Patient will meet greater than or equal to 90% of their needs  Progressing  MONITOR:   PO intake, Supplement acceptance, Labs, Weight trends, Skin, I & O's  REASON FOR ASSESSMENT:   Consult Assessment of nutrition requirement/status  ASSESSMENT:   [name] is a 76 y.o. female presenting with headache and weakness after 4 days of emesis, now with headache and weakness. PMH is significant for OSA, HTN, CKD-3, PAF, hypothyroidism, malnutrition and restless leg syndrome.  1/7- s/p flexible sigmoidoscopy; revealed vascular pattern based decreased mucosa in sigmoid colon- biopsied and lomotil started per GI notes   Pt advanced to a regular diet on 11/24/16. Intake remains poor; meal completion 0-25%. Boost Breeze supplements ordered, however, pt refusing per MAR. Due to diet advancement, will substitute with Ensure supplements per pt request. RD to order.   Labs reviewed.   Diet Order:  Diet regular Room service appropriate? Yes; Fluid consistency: Thin  Skin:  Reviewed, no issues  Last BM:  11/25/16  Height:   Ht Readings from Last 1 Encounters:  11/18/16 5\' 2"  (1.575 m)    Weight:   Wt Readings from Last 1 Encounters:  11/25/16 111 lb 14.6 oz (50.8 kg)    Ideal Body Weight:  50 kg  BMI:  Body mass index is 20.47 kg/m.  Estimated Nutritional Needs:   Kcal:  1550-1750  Protein:  70-85 grams  Fluid:  >1.5 L  EDUCATION NEEDS:   Education needs  addressed  Brody Bonneau A. Mayford KnifeWilliams, RD, LDN, CDE Pager: [phone] After hours Pager: [phone]

## 2016-11-25 NOTE — Care Management Important Message (Signed)
Important Message  Patient Details  Name: Rose FillersJudy C Donna MRN: [id number] Date of Birth: 1941/10/02   Medicare Important Message Given:  Yes    Lulu Hirschmann Stefan ChurchBratton 11/25/2016, 3:53 PM

## 2016-11-25 NOTE — Care Management Note (Signed)
Case Management Note  Patient Details  Name: [name] MRN: [id number] Date of Birth: [birth year]  Subjective/Objective:                    Action/Plan:  [facility] , she requested order and face to face be faxed to them and they will arrange HHPT , fax number [phone] , same done .  Expected Discharge Date:                  Expected Discharge Plan:  Assisted Living / Rest Home  In-House Referral:  Clinical Social Work  Discharge planning Services  CM Consult  Post Acute Care Choice:  Home Health Choice offered to:     DME Arranged:    DME Agency:     HH Arranged:    HH Agency:     Status of Service:  Completed, signed off  If discussed at [employer] Meetings, dates discussed:    Additional Comments:  Kingsley PlanWile, [name], RN 11/25/2016, 2:39 PM

## 2016-11-25 NOTE — Progress Notes (Signed)
UNASSIGNED PATIENT Subjective:  Patient seems to be doing better today from a GI standpoint. Her diarrhea has improved significantly; she has has had 2 soft BMs today. She has a headache with nausea which seems to be bothering her she denies having any abdominal pain. Objective: Vital signs in last 24 hours: Temp:  [98.2 F (36.8 C)-98.3 F (36.8 C)] 98.2 F (36.8 C) (01/08 0616) Pulse Rate:  [77-82] 82 (01/08 0616) Resp:  [15-19] 15 (01/08 0616) BP: (109-122)/(51-55) 109/51 (01/08 0616) SpO2:  [99 %] 99 % (01/08 0616) Weight:  [50.8 kg (111 lb 14.6 oz)] 50.8 kg (111 lb 14.6 oz) (01/08 0616) Last BM Date: 11/25/16  Intake/Output from previous day: 01/07 0701 - 01/08 0700 In: 1314.2 [P.O.:820; I.V.:494.2] Out: -  Intake/Output this shift: No intake/output data recorded.  General appearance: alert, cooperative, appears older than stated age, cachectic and no distress Resp: clear to auscultation bilaterally Cardio: regular rate and rhythm, S1, S2 normal, no murmur, click, rub or gallop GI: soft, non-tender; bowel sounds normal; no masses,  no organomegaly Extremities: extremities normal, atraumatic, no cyanosis or edema  Lab Results:  Recent Labs  11/23/16 0904 11/24/16 0723 11/25/16 0520  WBC 5.4 5.4 5.3  HGB 13.2 12.5 11.6*  HCT 38.8 37.7 35.2*  PLT 86* 85* 92*   BMET  Recent Labs  11/23/16 0506  11/23/16 1952 11/24/16 0723 11/25/16 0520  NA 141  --   --  138 141  K 5.5*  < > 4.6 4.1 4.6  CL 108  --   --  104 105  CO2 29  --   --  27 29  GLUCOSE 100*  --   --  93 79  BUN <5*  --   --  <5* 6  CREATININE 1.11*  --   --  1.27* 1.59*  CALCIUM 9.6  --   --  10.1 10.0  < > = values in this interval not displayed.  Medications: I have reviewed the patient's current medications.  Assessment/Plan: 1) Abnormal CT with left sided colitis-mild mild decrease in vascular pattern on physical sigmoidoscopy patient seems to have improved with Lomotil. All the work up so  far has been unrevealing. My suspicion is that she may have overflow incontinence presenting as diarrhea with her history of chronic constipation benefits important to keep her stools regular and maintain her on MiraLAX that she does not get constipated with Lomotil. I would not plan to do a repeat colonoscopy on an OP basis on a DNR patient with such co-morbidities.  2) History of chronic headaches with nausea and vomiting.   LOS: 7 days   Autymn Omlor 11/25/2016, 1:27 PM

## 2016-11-25 NOTE — Progress Notes (Signed)
Pt. Had BM that was liquid and brown in color with somewhat of a dark red color as well. MD paged to notify.

## 2016-11-25 NOTE — Evaluation (Signed)
Physical Therapy Evaluation Patient Details Name: Rose FillersJudy C Lofaso MRN: [id number] DOB: [birth year] Today's Date: 11/25/2016   History of Present Illness  Claretta FraiseJudy C Obannonis a 76 y.o.femalepresenting with headache and weakness after 4 days of emesis, now with headache and weakness. PMH is significant for OSA, HTN, CKD-3, PAF, hypothyroidism, malnutrition andrestless leg syndrome.  Clinical Impression  Pt admitted with above diagnosis. Pt currently with functional limitations due to the deficits listed below (see PT Problem List). Pt was able to ambulate with RW but with LOB x 3 needing min to mod assist.  Feel that pt will need min assist for ambulation on d/c at least initially therefore need to confirm that A living can provide this.  Pt is willing to use a wheelchair until she progresses back to RW if A living allows that.  Pt needs continued PT to progress to her baseline.  Pt will benefit from skilled PT to increase their independence and safety with mobility to allow discharge to the venue listed below.      Follow Up Recommendations Home health PT;Supervision/Assistance - 24 hour (If A living cannot provide min assist initially or provide wheelchair for pt to use as pt is agreeable to use wheelchair initially if they provide and allow that)    Equipment Recommendations  None recommended by PT    Recommendations for Other Services       Precautions / Restrictions Precautions Precautions: Fall Restrictions Weight Bearing Restrictions: No      Mobility  Bed Mobility Overal bed mobility: Independent                Transfers Overall transfer level: Needs assistance Equipment used: Rolling walker (2 wheeled) Transfers: Sit to/from Stand Sit to Stand: Min assist;Mod assist         General transfer comment: Pt lost balance posterior once she stood up needing mod assist to recover.  Pt generally unsteady on her feet initially.   Ambulation/Gait Ambulation/Gait  assistance: Min guard;Min assist Ambulation Distance (Feet): 110 Feet Assistive device: Rolling walker (2 wheeled) Gait Pattern/deviations: Step-through pattern;Decreased stride length;Trunk flexed;Drifts right/left;Staggering left;Staggering right;Leaning posteriorly;Shuffle   Gait velocity interpretation: Below normal speed for age/gender General Gait Details: Pt with staggering gait initially that did improve as pt continued to ambulate.  Pt however will need initial assist as she is inconsistent with steadiness.  Needed assist for 2 LOB with ambulation.    Stairs            Wheelchair Mobility    Modified Rankin (Stroke Patients Only)       Balance Overall balance assessment: Needs assistance;History of Falls Sitting-balance support: No upper extremity supported;Feet supported Sitting balance-Leahy Scale: Good     Standing balance support: Bilateral upper extremity supported;During functional activity Standing balance-Leahy Scale: Poor Standing balance comment: relies on bil UE support for balance.               High level balance activites: Direction changes;Turns;Sudden stops High Level Balance Comments: Needed min assist for baalnce with challenges as she loses balance with challenges.              Pertinent Vitals/Pain Pain Assessment: No/denies pain  See vitals below  Home Living Family/patient expects to be discharged to:: Assisted living Living Arrangements: Alone Available Help at Discharge: Family;Available PRN/intermittently Type of Home: Assisted living Home Access: Level entry     Home Layout: One level Home Equipment: Cane - single point;Walker - 2 wheels;Walker - 4 wheels;Bedside commode;Shower seat  Prior Function Level of Independence: Independent with assistive device(s)         Comments: Used RW at all times with Modif I.  Walked entire facility per pt.      Hand Dominance        Extremity/Trunk Assessment   Upper  Extremity Assessment Upper Extremity Assessment: Defer to OT evaluation    Lower Extremity Assessment Lower Extremity Assessment: Generalized weakness    Cervical / Trunk Assessment Cervical / Trunk Assessment: Kyphotic  Communication   Communication: No difficulties  Cognition Arousal/Alertness: Awake/alert Behavior During Therapy: Flat affect Overall Cognitive Status: Within Functional Limits for tasks assessed                      General Comments General comments (skin integrity, edema, etc.): Pt reported dizziness upon sitting up however BP's stable with initial BP in sitting 134/69 wtih HR 79.  Post BP was 123/83 with HR 93.      Exercises General Exercises - Lower Extremity Ankle Circles/Pumps: AROM;Both;10 reps;Seated Long Arc Quad: AROM;Both;10 reps;Seated   Assessment/Plan    PT Assessment Patient needs continued PT services  PT Problem List Decreased activity tolerance;Decreased balance;Decreased mobility;Decreased knowledge of use of DME;Decreased safety awareness;Decreased knowledge of precautions;Cardiopulmonary status limiting activity          PT Treatment Interventions DME instruction;Gait training;Functional mobility training;Therapeutic activities;Therapeutic exercise;Balance training;Patient/family education    PT Goals (Current goals can be found in the Care Plan section)  Acute Rehab PT Goals Patient Stated Goal: To go back to A living PT Goal Formulation: With patient Time For Goal Achievement: 12/09/16 Potential to Achieve Goals: Good    Frequency Min 3X/week   Barriers to discharge Decreased caregiver support      Co-evaluation               End of Session Equipment Utilized During Treatment: Gait belt Activity Tolerance: Patient limited by fatigue Patient left: in bed;with call bell/phone within reach;with bed alarm set Nurse Communication: Mobility status         Time: 4098-1191 PT Time Calculation (min) (ACUTE  ONLY): 13 min   Charges:   PT Evaluation $PT Eval Moderate Complexity: 1 Procedure     PT G Codes:        Larin Depaoli F Briley Sulton 12/22/16, 1:12 PM Corneluis Allston,PT Acute Rehabilitation [phone] (pager)

## 2016-11-25 NOTE — Clinical Social Work Note (Signed)
CSW consulted for ALF placement. [facility] is [facility] [place] and is able to return when stable. PT is recommending home health PT. Per MD, pt is not ready for discharge today. Pt's daughter is aware as well as facility.   Dede QuerySarah Hjalmar Ballengee, MSW, LCSW  Clinical Social Worker  [phone]

## 2016-11-25 NOTE — Progress Notes (Signed)
Family Medicine Teaching Service Daily Progress Note Intern Pager: [phone]  Patient name: [name] Medical record number: [id number] Date of birth: 01-17-1941 Age: 76 y.o. Gender: female  Primary Care Provider: Pcp Not In System Consultants: none Code Status: DNR  Pt Overview and Major Events to Date:  01/02: patient admitted for HA and weakness in the setting of emesis and dehydration  Assessment and Plan: [name] is a 76 y.o. female presenting with headache and weakness after 4 days of emesis, now with headache and weakness. PMH is significant for OSA, HTN, CKD-3, PAF, hypothyroidism, malnutrition and restless leg syndrome.  #Ana Pain and Diarrhea/Emesis likely 2/2 Colitis, Improving: Patient continues to have nausea w/o vomiting.  Her diarrhea is improving.  No leukocytosis and afebrile. CT abdomen yielded evidence of descending colon containing, small bowel lymphadenitis, and mesenteric stranding suggesting possible colitis. No evidence of obstruction or acute abdomen on exam. She has generalized Ana tenderness, though seems to be increased at RUQ.  [name]: mild gallbladder wall thickening without gallstones, pericholecystic fluid or sonographic Murphy sign. KUB negative.  Last colonoscopy in 2008, will need repeat as outpatient when over acute illness. C diff neg. Flu neg. GI panel neg. FOBT neg. Completed IV Zosyn 11/19/16 > 11/23/16. Sigmoidoscopy 1/7 c/w perianal dermatitis, biopsies obtained, lomotil scheduled, infection excluded. Topical cream to rectum scheduled. --Fecal ova + parasite pending --Nutrition consult, appreciate recs --Daily weights, 3 lb weight loss this admission. --I&Os, including BMs --KVO --Advance diet to regular --Monitor for development of acute abdomen --Zofran PRN --Protonix 40 mg daily   --Tramadol 100 mg q6h PRN --Probiotics  #AKI on CKD-3: likely prerenal in the setting of dehydration 2/2 emesis. Serum creatinine 2.2 on  admission. Baseline about 2.0. At 1.59 today. --IV fluid as above, tolerating PO better --Hold nephrotoxic medications  #Hypokalemia with Hypomagnesemia, resolved: last K 4.6 on 01/08. --Monitor BMP   #Thrombocytopenia, Acute, Improving: decrease in plt count 171>85>92.  Baseline is around 118. On lovenox SQ. Had a HIT workup 2 years ago negative. Not likely HIT given normal WBC and Hgb with absence of bleeding with VSS. Will need to monitor plts closely and consider stopping heparin if worsens. --Monitor plt ct, if worsens consider stopping heparin and perform HIT workup  #Chronic Small Vessel Ischemic Disease: MRI brain on 07/18/2016 with moderate-severe perisylvian and mesial temporal atrophy and Moderate-severe periventricular and subcortical and pontine chronic small vessel ischemic disease. He has been on Plavix. --We will continue home Plavix for now  #PAT/Heart Murmur: RRR now. She has 3/6 SEM over sternal borders left>right. EKG WNL 11/18/16. ECHO for cardiac murmur: EF 60-65%, G1DD, mild LVH, mild aortic stenosis. --Continue home Lopressor 12.5 BID --Hold home Diltiazem for now (BP somewhat low) --Telemetry  #Hypertension: 125/56 this AM. --Holding dilt while BP low --Continue home Lopressor 12.5 BID  #OSA: Doesn't use CPAP at ALF --Nightly CPAP  #Hypothyroidism: On Synthroid 112 MCG at home --Continue on Synthroid, repeat TSH >> 0.377 (no med change needed)  #Protein calorie malnutrition: Frail-looking patient --Nutrition consult  #Insomnia: On clonazepam 2 mg and trazodone 100 mg at bedtime --Decrease clonazepam to 1 mg at bedtime --Trazodone 100 mg at bedtime  #?Dementia: Appears to be on Aricept 10 mg at home --Hold home Aricept for now  FEN/GI: clear diet, advance as tolerated, IVF D5NS @10  ml/hr, Protonix 40 mg daily  PPx: lovenox SQ  Disposition: pending improvement of diarrhea and PO intake.  Will PO challenge.  If she is able  to tolerate PO, will plan  for possible d/c on 1/8.  Subjective:  Patient feeling well overall. Diarrhea persists but has been less frequent per patient. Denies nausea or vomiting over last 24 hours. Able to eat some of her food. Says she is wanting to go back to her ALF.  Objective: Temp:  [97.3 F (36.3 C)-98.5 F (36.9 C)] 98.2 F (36.8 C) (01/08 0616) Pulse Rate:  [70-82] 82 (01/08 0616) Resp:  [15-22] 15 (01/08 0616) BP: (109-154)/(51-56) 109/51 (01/08 0616) SpO2:  [99 %] 99 % (01/08 0616) Weight:  [111 lb 14.6 oz (50.8 kg)] 111 lb 14.6 oz (50.8 kg) (01/08 16100616) Physical Exam: General: elderly frail woman, well nourished, well developed, no acute distress with non-toxic appearance HEENT: normocephalic, atraumatic, moist mucous membranes CV: regular rate and rhythm without murmurs, rubs, or gallops Lungs: clear to auscultation bilaterally with normal work of breathing on room air Abdomen: soft, ND,minimal RUQ tenderness without rebound or guarding, no masses or organomegaly palpable, normoactive bowel sounds Skin: warm, dry, moderately erythematous around perianal region w/o skin breakdown, no lesions, cap refill < 2 seconds Extremities: warm and well perfused, normal tone  Laboratory: C-diff: negative GI panel: negative FOBT: negative Flu: negative UA: negative TSH: 0.377 Lactic acid: 0.63 Lipase: 66 Mg: 1.2>2.0>2.1  Recent Labs Lab 11/23/16 0904 11/24/16 0723 11/25/16 0520  WBC 5.4 5.4 5.3  HGB 13.2 12.5 11.6*  HCT 38.8 37.7 35.2*  PLT 86* 85* 92*    Recent Labs Lab 11/18/16 1143 11/19/16 0404  11/23/16 0506  11/23/16 1952 11/24/16 0723 11/25/16 0520  NA 140 141  < > 141  --   --  138 141  K 3.8 3.3*  < > 5.5*  < > 4.6 4.1 4.6  CL 97* 105  < > 108  --   --  104 105  CO2 24 20*  < > 29  --   --  27 29  BUN 23* 13  < > <5*  --   --  <5* 6  CREATININE 2.21* 1.86*  < > 1.11*  --   --  1.27* 1.59*  CALCIUM 10.0 8.7*  < > 9.6  --   --  10.1 10.0  PROT 7.3 5.5*  --   --   --   --    --   --   BILITOT 1.3* 1.1  --   --   --   --   --   --   ALKPHOS 59 41  --   --   --   --   --   --   ALT 20 15  --   --   --   --   --   --   AST 25 20  --   --   --   --   --   --   GLUCOSE 77 57*  < > 100*  --   --  93 79  < > = values in this interval not displayed.  Imaging/Diagnostic Tests: Flexible Sigmoidoscopy (11/24/2016) IMPRESSION: - Perianal fungal rash found on perianal exam. - Rectal tenderness found on digital rectal exam. - Vascular-pattern-decreased mucosa in the sigmoid colon and in the descending colon.  EKG: Unremarkable  US Abdomen Complete (11/19/2016) FINDINGS: Gallbladder: Mild gallbladder wall thickening but no gallstones, pericholecystic fluid or sonographic Murphy sign to suggest acute cholecystitis. Common bile duct: Diameter: 4.1 mm Liver: Normal echogenicity without focal lesion or biliary dilatation. IVC: Normal caliber Pancreas: Sonographically unremarkable.  Spleen: Normal size. No focal lesions. Small amount of perisplenic fluid. Right Kidney: Length: 9.1 cm. Increased echogenicity but normal renal cortical thickness. No renal lesions or hydronephrosis. Left Kidney: Length: 8.6 cm. Increased echogenicity but normal renal cortical thickness. No renal lesions or hydronephrosis. Ana aorta: Normal caliber Other findings: None.  IMPRESSION: Mild gallbladder wall thickening but no gallstones, pericholecystic fluid or sonographic Murphy sign. Normal caliber common bile duct and normal liver. Normal sonographic appearance of the pancreas, spleen and both kidneys except for mild medical renal disease.  DG Abd 1 View (11/20/2016) FINDINGS: Moderate scoliosis of the spine. Nonobstructive bowel gas pattern. No pathologic calcifications.  IMPRESSION: Nonobstructed bowel-gas pattern  Transthoracic Echocardiography (11/19/2016) Study Conclusions - Left ventricle: The cavity size was normal. Wall thickness was   increased in a pattern of mild  LVH. Systolic function was normal.   The estimated ejection fraction was in the range of 60% to 65%.   Wall motion was normal; there were no regional wall motion   abnormalities. Doppler parameters are consistent with abnormal   left ventricular relaxation (grade 1 diastolic dysfunction). - Aortic valve: Trileaflet; moderately calcified leaflets. There   was mild stenosis. Mean gradient (S): 12 mm Hg. - Mitral valve: Mildly calcified annulus. - Left atrium: The atrium was mildly dilated. - Right ventricle: The cavity size was normal. Systolic function   was normal. - Pulmonary arteries: No complete TR doppler jet so unable to   estimate PA systolic pressure. - Inferior vena cava: The vessel was normal in size. The   respirophasic diameter changes were in the normal range (>= 50%),   consistent with normal central venous pressure.  Impressions: - Normal LV size with mild LV hypertrophy. EF 60-65%. Normal RV   size and systolic function. Mild aortic stenosis.  Wendee Beavers, DO 11/25/2016, 8:42 AM PGY-3, [facility] FPTS Intern pager: [phone], text pages welcome

## 2016-11-25 NOTE — Progress Notes (Signed)
Transitions of Care Pharmacy Note  Plan:  Reviewed patients prior to admission medications and explained that no major changes had occurred. Patient was comfortable with her regimen. Will require more education if changes are made.   --------------------------------------------- Rose FillersJudy C Spiewak is an 76 y.o. female who presents with a chief complaint of headache and weakness. In anticipation of discharge, pharmacy has reviewed this patient's prior to admission medication history, as well as [facility]  Current medication indications, dosing, frequency, and notable side effects reviewed with patient. patient verbalized understanding of current inpatient medication regimen and is aware that the After Visit Summary when presented, will represent the most accurate medication list at discharge.    Assessment: Understanding of regimen: good Understanding of indications: fair Potential of compliance: good Barriers to Obtaining Medications: No  Patient instructed to contact inpatient pharmacy team with further questions or concerns if needed.    Time spent preparing for discharge counseling: 10 minutes  Time spent counseling patient: 10 minutes    Thank you for allowing pharmacy to be a part of this patient's care.  Hillis RangeEmily A Stewart, PharmD PGY1 Pharmacy Resident  Pager: [phone]

## 2016-11-26 LAB — BASIC METABOLIC PANEL
ANION GAP: 7 (ref 5–15)
BUN: 11 mg/dL (ref 6–20)
CO2: 29 mmol/L (ref 22–32)
Calcium: 9.5 mg/dL (ref 8.9–10.3)
Chloride: 102 mmol/L (ref 101–111)
Creatinine, Ser: 1.83 mg/dL — ABNORMAL HIGH (ref 0.44–1.00)
GFR calc non Af Amer: 26 mL/min — ABNORMAL LOW (ref >60)
GFR, EST AFRICAN AMERICAN: 30 mL/min — AB (ref >60)
Glucose, Bld: 90 mg/dL (ref 65–99)
POTASSIUM: 3.7 mmol/L (ref 3.5–5.1)
SODIUM: 138 mmol/L (ref 135–145)

## 2016-11-26 LAB — CBC
HCT: 34.2 % — ABNORMAL LOW (ref 36.0–46.0)
HEMOGLOBIN: 11.2 g/dL — AB (ref 12.0–15.0)
MCH: 30.8 pg (ref 26.0–34.0)
MCHC: 32.7 g/dL (ref 30.0–36.0)
MCV: 94 fL (ref 78.0–100.0)
Platelets: 115 10*3/uL — ABNORMAL LOW (ref 150–400)
RBC: 3.64 MIL/uL — AB (ref 3.87–5.11)
RDW: 13.9 % (ref 11.5–15.5)
WBC: 5 10*3/uL (ref 4.0–10.5)

## 2016-11-26 MED ORDER — DIPHENOXYLATE-ATROPINE 2.5-0.025 MG PO TABS: 1.0000 | ORAL_TABLET | Freq: Three times a day (TID) | ORAL | 0 refills | Status: DC | PRN

## 2016-11-26 MED ORDER — ENSURE ENLIVE PO LIQD: 237.0000 mL | Freq: Three times a day (TID) | ORAL | 12 refills | Status: DC

## 2016-11-26 NOTE — Progress Notes (Signed)
Family Medicine Teaching Service Daily Progress Note Intern Pager: [phone]  Patient name: [name] Medical record number: [id number] Date of birth: February 16, 1941 Age: 76 y.o. Gender: female  Primary Care Provider: Pcp Not In System Consultants: none Code Status: DNR  Pt Overview and Major Events to Date:  01/02: patient admitted for HA and weakness in the setting of emesis and dehydration  Assessment and Plan: [name] is a 76 y.o. female presenting with headache and weakness after 4 days of emesis, now with headache and weakness. PMH is significant for OSA, HTN, CKD-3, PAF, hypothyroidism, malnutrition and restless leg syndrome.  #Abdominal Pain and Diarrhea/Emesis likely 2/2 Colitis, Improving: Patient w/o nausea or vomiting.  Her diarrhea is improving with decreased frequency.  No leukocytosis and afebrile. CT abdomen yielded evidence of descending colon containing, small bowel lymphadenitis, and mesenteric stranding suggesting possible colitis. No evidence of obstruction or acute abdomen on exam. She has generalized abdominal tenderness, though seems to be increased at RUQ.  Abdominal US: mild gallbladder wall thickening without gallstones, pericholecystic fluid or sonographic Murphy sign. KUB negative. Last colonoscopy in 2008, will need repeat as outpatient when over acute illness. C diff neg. Flu neg. GI panel neg. FOBT neg. Completed IV Zosyn 11/19/16 > 11/23/16. Sigmoidoscopy 1/7 c/w perianal dermatitis, biopsies obtained, lomotil scheduled, infection excluded. Topical cream to rectum scheduled. No stool since yesterday (01/08). --Fecal ova + parasite pending --Sigmoidoscopy surgical specimen pending --Nutrition consult, appreciate recs --Daily weights --I&Os, including BMs --KVO --Advance diet to regular --Monitor for development of acute abdomen --Zofran PRN --Protonix 40 mg daily   --Tramadol 100 mg q6h PRN --Probiotics  #AKI on CKD-3, Resolved: likely prerenal in the  setting of dehydration 2/2 emesis. Serum creatinine 2.2 on admission. Baseline about 2.0. At 1.83 today. --IV fluid as above, tolerating PO better --Hold nephrotoxic medications  #Hypokalemia with Hypomagnesemia, Acute, Resolved: last K 3.7 on 01/09. --Monitor BMP   #Thrombocytopenia, Acute, Resolved: decrease in plt count 171>85>92>115.  Baseline is around 118. On lovenox SQ. Had a HIT workup 2 years ago negative. Not likely HIT given normal WBC and Hgb with absence of bleeding with VSS. Will need to monitor plts closely and consider stopping heparin if worsens. --Monitor plt ct, if worsens consider stopping heparin and perform HIT workup  #Chronic Small Vessel Ischemic Disease, Chronic, Stable: MRI brain on 07/18/2016 with moderate-severe perisylvian and mesial temporal atrophy and Moderate-severe periventricular and subcortical and pontine chronic small vessel ischemic disease. He has been on Plavix. --We will continue home Plavix for now  #PAT/Heart Murmur, Chronic, Stable: RRR now. She has 3/6 SEM over sternal borders left>right. EKG WNL 11/18/16. ECHO for cardiac murmur: EF 60-65%, G1DD, mild LVH, mild aortic stenosis. --Continue home Lopressor 12.5 BID --Hold home Diltiazem for now (BP somewhat low) --Telemetry  #Hypertension, Chronic, Stable: 125/56 this AM. --Holding dilt while BP low --Continue home Lopressor 12.5 BID  #OSA, Chronic, Stable: Doesn't use CPAP at ALF --Nightly CPAP  #Hypothyroidism, Chronic, Stable: On Synthroid 112 MCG at home --Continue on Synthroid, repeat TSH >> 0.377 (no med change needed)  #Protein calorie malnutrition: Frail-looking patient --Nutrition consult  #Insomnia, Chronic, Stable: On clonazepam 2 mg and trazodone 100 mg at bedtime --Decrease clonazepam to 1 mg at bedtime --Trazodone 100 mg at bedtime  #Dementia, Chronic, Stable: Appears to be on Aricept 10 mg at home --Hold home Aricept for now  FEN/GI: clear diet, advance as  tolerated, IVF D51/2NS @75  ml/hr, Protonix 40 mg daily  PPx:  lovenox SQ  Disposition: pending improvement of diarrhea and PO intake.  Will PO challenge.  If she is able to tolerate PO, will plan for possible d/c on 1/9.  Subjective:  Patient states he HA and abdominal pain have improved since yesterday. States she has not had a bowel movement this AM. Endorses eating minimal food yesterday but able to drink liquids. Says she is drinking a coke this AM but is willing to drink more water.  Objective: Temp:  [97.7 F (36.5 C)-98.5 F (36.9 C)] 98 F (36.7 C) (01/09 0500) Pulse Rate:  [81-88] 88 (01/09 0500) Resp:  [16-19] 19 (01/09 0500) BP: (122-128)/(57-61) 122/61 (01/09 0500) SpO2:  [98 %-100 %] 99 % (01/09 0500) Weight:  [116 lb 9.6 oz (52.9 kg)] 116 lb 9.6 oz (52.9 kg) (01/09 0500) Physical Exam: General: elderly frail woman, well nourished, well developed, no acute distress with non-toxic appearance HEENT: normocephalic, atraumatic, moist mucous membranes CV: regular rate and rhythm without murmurs, rubs, or gallops Lungs: clear to auscultation bilaterally with normal work of breathing on room air Abdomen: soft, ND,minimal RUQ tenderness without rebound or guarding, no masses or organomegaly palpable, normoactive bowel sounds Skin: warm, dry, moderately erythematous around perianal region w/o skin breakdown, no lesions, cap refill < 2 seconds Extremities: warm and well perfused, normal tone  Laboratory: C-diff: negative GI panel: negative FOBT: negative Flu: negative UA: negative TSH: 0.377 Lactic acid: 0.63 Lipase: 66 Mg: 1.2>2.0>2.1  Recent Labs Lab 11/24/16 0723 11/25/16 0520 11/26/16 0807  WBC 5.4 5.3 5.0  HGB 12.5 11.6* 11.2*  HCT 37.7 35.2* 34.2*  PLT 85* 92* 115*    Recent Labs Lab 11/23/16 0506  11/23/16 1952 11/24/16 0723 11/25/16 0520  NA 141  --   --  138 141  K 5.5*  < > 4.6 4.1 4.6  CL 108  --   --  104 105  CO2 29  --   --  27 29  BUN <5*   --   --  <5* 6  CREATININE 1.11*  --   --  1.27* 1.59*  CALCIUM 9.6  --   --  10.1 10.0  GLUCOSE 100*  --   --  93 79  < > = values in this interval not displayed.  Imaging/Diagnostic Tests: Flexible Sigmoidoscopy (11/24/2016) IMPRESSION: - Perianal fungal rash found on perianal exam. - Rectal tenderness found on digital rectal exam. - Vascular-pattern-decreased mucosa in the sigmoid colon and in the descending colon.  EKG: Unremarkable  US Abdomen Complete (11/19/2016) FINDINGS: Gallbladder: Mild gallbladder wall thickening but no gallstones, pericholecystic fluid or sonographic Murphy sign to suggest acute cholecystitis. Common bile duct: Diameter: 4.1 mm Liver: Normal echogenicity without focal lesion or biliary dilatation. IVC: Normal caliber Pancreas: Sonographically unremarkable. Spleen: Normal size. No focal lesions. Small amount of perisplenic fluid. Right Kidney: Length: 9.1 cm. Increased echogenicity but normal renal cortical thickness. No renal lesions or hydronephrosis. Left Kidney: Length: 8.6 cm. Increased echogenicity but normal renal cortical thickness. No renal lesions or hydronephrosis. Abdominal aorta: Normal caliber Other findings: None.  IMPRESSION: Mild gallbladder wall thickening but no gallstones, pericholecystic fluid or sonographic Murphy sign. Normal caliber common bile duct and normal liver. Normal sonographic appearance of the pancreas, spleen and both kidneys except for mild medical renal disease.  DG Abd 1 View (11/20/2016) FINDINGS: Moderate scoliosis of the spine. Nonobstructive bowel gas pattern. No pathologic calcifications.  IMPRESSION: Nonobstructed bowel-gas pattern  Transthoracic Echocardiography (11/19/2016) Study Conclusions - Left ventricle: The cavity size  was normal. Wall thickness was   increased in a pattern of mild LVH. Systolic function was normal.   The estimated ejection fraction was in the range of 60% to 65%.   Wall  motion was normal; there were no regional wall motion   abnormalities. Doppler parameters are consistent with abnormal   left ventricular relaxation (grade 1 diastolic dysfunction). - Aortic valve: Trileaflet; moderately calcified leaflets. There   was mild stenosis. Mean gradient (S): 12 mm Hg. - Mitral valve: Mildly calcified annulus. - Left atrium: The atrium was mildly dilated. - Right ventricle: The cavity size was normal. Systolic function   was normal. - Pulmonary arteries: No complete TR doppler jet so unable to   estimate PA systolic pressure. - Inferior vena cava: The vessel was normal in size. The   respirophasic diameter changes were in the normal range (>= 50%),   consistent with normal central venous pressure.  Impressions: - Normal LV size with mild LV hypertrophy. EF 60-65%. Normal RV   size and systolic function. Mild aortic stenosis.    Wendee Beaversavid J McMullen, DO 11/26/2016, 9:30 AM PGY-1, [facility] Family Medicine FPTS Intern pager: [phone], text pages welcome

## 2016-11-26 NOTE — Progress Notes (Signed)
Occupational Therapy Treatment Patient Details Name: [name] MRN: [id number] DOB: 16-Mar-1941 Today's Date: 11/26/2016    History of present illness TONDALAYA PERREN a 76 y.o.femalepresenting with headache and weakness after 4 days of emesis, now with headache and weakness. PMH is significant for OSA, HTN, CKD-3, PAF, hypothyroidism, malnutrition andrestless leg syndrome.   OT comments  Pt agreed to OOB and to [facility] and back to bed. Pt needed increased time and rest breaks as well as physical A  Follow Up Recommendations  Home health OT;Supervision/Assistance - 24 hour;SNF;Other (comment) (depending on A at ALF  may need ST SNF)    Equipment Recommendations  None recommended by OT;3 in 1 bedside commode    Recommendations for Other Services      Precautions / Restrictions Precautions Precautions: Fall Restrictions Weight Bearing Restrictions: No       Mobility Bed Mobility           pt overall min A with increased time        Transfers          pt overall mod A with increased time for hand placement as well as VC.              Balance Overall balance assessment: Needs assistance;History of Falls Sitting-balance support: No upper extremity supported;Feet supported Sitting balance-Leahy Scale: Good     Standing balance support: Bilateral upper extremity supported;During functional activity Standing balance-Leahy Scale: Poor Standing balance comment: relies on bil UE support for balance.                     ADL Overall ADL's : Needs assistance/impaired     Grooming: Minimal assistance;Oral care;Applying deodorant;Wash/dry face;Sitting       Lower Body Bathing: Moderate assistance;Sit to/from stand;Cueing for safety;Cueing for sequencing   Upper Body Dressing : Minimal assistance;Sitting   Lower Body Dressing: Moderate assistance;Sit to/from stand;Cueing for safety;Cueing for sequencing       Toileting- Clothing Manipulation and  Hygiene: Moderate assistance;Sit to/from stand;Cueing for safety;Cueing for sequencing         General ADL Comments: Pt lives at ALF  and will need significantly more A- may make sense to go to SNF for rehab prior to return to ALF.                 Cognition   Behavior During Therapy: WFL for tasks assessed/performed Overall Cognitive Status: Within Functional Limits for tasks assessed                               General Comments      Pertinent Vitals/ Pain       Pain Assessment: No/denies pain         Frequency  Min 3X/week        Progress Toward Goals  OT Goals(current goals can now be found in the care plan section)  Progress towards OT goals: Progressing toward goals     Plan Discharge plan needs to be updated       End of Session Equipment Utilized During Treatment: Rolling walker   Activity Tolerance Patient tolerated treatment well   Patient Left in bed;with call bell/phone within reach;with family/visitor present   Nurse Communication  redness on pts abdomen around scar from her C section        Time: 7829-5621 OT Time Calculation (min): 39 min  Charges: OT General Charges $OT Visit:  1 Procedure OT Treatments $Self Care/Home Management : 38-52 mins  Mckenzie Toruno, Metro KungLorraine D 11/26/2016, 12:13 PM

## 2016-11-26 NOTE — Progress Notes (Signed)
Pt discharged back to assisted living this evening.discharge instructions given to daughter and she expressed understanding the instructions.AVS and script for lomotil given at discharge

## 2016-11-26 NOTE — NC FL2 (Signed)
[place] MEDICAID FL2 LEVEL OF CARE SCREENING TOOL     IDENTIFICATION  Patient Name: [name] Birthdate: [birth year] Sex: female Admission Date (Current Location): 11/18/2016  [facility] and [place] Number:  Producer, television/film/video and Address:  The . [facility], [address], [place], [place] [address]      Provider Number: [id number]  Attending Physician Name and Address:  Nestor Ramp, MD  Relative Name and Phone Number:       Current Level of Care: Hospital Recommended Level of Care: Assisted Living Facility Prior Approval Number:    Date Approved/Denied:   PASRR Number:    Discharge Plan: Domiciliary (Rest home)    Current Diagnoses: Patient Active Problem List   Diagnosis Date Noted  . Diarrhea   . Colitis   . Dehydration 11/18/2016  . AKI (acute kidney injury) (HCC) 11/18/2016  . Abdominal pain   . Intractable vomiting with nausea   . Renal insufficiency   . Insomnia 09/30/2016  . Memory difficulty 06/05/2016  . Abnormality of gait 12/21/2015  . Cytopenia 02/09/2015  . Protein-calorie malnutrition, severe (HCC) 01/07/2015  . Vomiting and diarrhea 01/05/2015  . Sleep walking 10/08/2014  . Restless legs 10/08/2014  . Essential hypertension 09/16/2014  . Chronic kidney disease, stage 3 09/16/2014  . Paroxysmal atrial tachycardia (HCC) 09/16/2014  . Hypothyroidism 09/16/2014  . Obstructive sleep apnea 07/18/2011    Orientation RESPIRATION BLADDER Height & Weight     Self, Time, Situation, Place  Normal Incontinent Weight: 116 lb 9.6 oz (52.9 kg) Height:  5\' 2"  (157.5 cm)  BEHAVIORAL SYMPTOMS/MOOD NEUROLOGICAL BOWEL NUTRITION STATUS      Incontinent Diet (Regular Diet, Thin Liquids)  AMBULATORY STATUS COMMUNICATION OF NEEDS Skin   Limited Assist Verbally Normal                       Personal Care Assistance Level of Assistance  Bathing, Dressing, Feeding Bathing Assistance: Limited assistance Feeding assistance:  Independent Dressing Assistance: Limited assistance     Functional Limitations Info  Sight, Hearing, Speech Sight Info: Adequate Hearing Info: Adequate Speech Info: Adequate    SPECIAL CARE FACTORS FREQUENCY  PT (By licensed PT), OT (By licensed OT)                    Contractures Contractures Info: Not present    Additional Factors Info  Code Status, Allergies, Psychotropic Code Status Info: DNR Allergies Info: Vicodin Hydrocodone-acetaminophen Psychotropic Info: Zoloft         Discharge Medications: Please see discharge summary for a list of discharge medications. Medication List    STOP taking these medications   diltiazem 180 MG 24 hr capsule Commonly known as:  CARDIZEM CD  diphenhydrAMINE 25 MG tablet Commonly known as:  BENADRYL  loperamide 2 MG capsule Commonly known as:  IMODIUM    TAKE these medications   acetaminophen 500 MG tablet Commonly known as:  TYLENOL Take 1,000 mg by mouth every 6 (six) hours as needed for headache (minor discomfort (contact MD if headache or pain persists for more than 24 hours).  baclofen 10 MG tablet Commonly known as:  LIORESAL Take 5 mg by mouth at bedtime as needed (back spasms).  budesonide-formoterol 160-4.5 MCG/ACT inhaler Commonly known as:  SYMBICORT Inhale 2 puffs into the lungs 2 (two) times daily. Rinse mouth after use - discard 3 months after opening  calcitRIOL 0.25 MCG capsule Commonly known as:  ROCALTROL Take  0.25 mcg by mouth every Monday, Wednesday, and Friday. Do not crush  clonazePAM 2 MG tablet Commonly known as:  KLONOPIN Take 1 tablet (2 mg total) by mouth at bedtime.  clopidogrel 75 MG tablet Commonly known as:  PLAVIX Take 1 tablet (75 mg total) by mouth daily.  diltiazem 30 MG tablet Commonly known as:  CARDIZEM Take 30 mg by mouth at bedtime as needed (palpitations or tachycardia). Do not crush  diphenoxylate-atropine 2.5-0.025 MG tablet Commonly known as:  LOMOTIL Take 1 tablet by  mouth 3 (three) times daily as needed for diarrhea or loose stools.  donepezil 10 MG tablet Commonly known as:  ARICEPT Take 1 tablet (10 mg total) by mouth at bedtime.  feeding supplement (ENSURE ENLIVE) Liqd Take 237 mLs by mouth 3 (three) times daily between meals.  Fish Oil 1000 MG Caps Take 1,000 mg by mouth daily. Do not crush  levothyroxine 112 MCG tablet Commonly known as:  SYNTHROID, LEVOTHROID Take 112 mcg by mouth daily.  metoprolol tartrate 25 MG tablet Commonly known as:  LOPRESSOR Take 12.5 mg by mouth 2 (two) times daily.  ondansetron 4 MG tablet Commonly known as:  ZOFRAN Take 4 mg by mouth every 8 (eight) hours as needed for nausea or vomiting.  pantoprazole 40 MG tablet Commonly known as:  PROTONIX Take 40 mg by mouth daily. Do not crush  polyethylene glycol packet Commonly known as:  MIRALAX / GLYCOLAX Take 17 g by mouth daily as needed (constipation). Mix in 8 oz of water and drink  PROBIOTIC PO Take 1 tablet by mouth 2 (two) times daily. Do not crush  traMADol 50 MG tablet Commonly known as:  ULTRAM Take 50 mg by mouth See admin instructions. Take 1 tablet (50 mg) by mouth 3 times daily, may also take 1 tablet every 8 hours as needed for pain  traZODone 100 MG tablet Commonly known as:  DESYREL Take 1 tablet (100 mg total) by mouth at bedtime.  ZOLOFT 100 MG tablet Generic drug:  sertraline Take 150 mg by mouth daily.       Relevant Imaging Results:  Relevant Lab Results:   Additional Information [facility]  Dede QuerySarah Baleria Wyman, [place]

## 2016-11-26 NOTE — Progress Notes (Signed)
Physical Therapy Treatment Patient Details Name: [name] MRN: [id number] DOB: 1941-10-13 Today's Date: 11/26/2016    History of Present Illness [name] a 76 y.o.femalepresenting with headache and weakness after 4 days of emesis. PMH is significant for OSA, HTN, CKD-3, PAF, hypothyroidism, malnutrition andrestless leg syndrome.    PT Comments    Pt reports fatigue and HA and unable/unwilling to progress mobility beyond the bathroom. Pt denied sitting in chair or performing HEP. Pt with continued LOB in standing and denies any desire to do ST-SNF. Pt again educated for assist with mobility or use of WC initially until able to safely regain function and gait. Pt reports prior 3 falls since moving to ALF and educated for current fall risk and potential complications. Pt educated for bed level HEP and encouraged to perform along with increasing mobility with nursing assist acutely.    Follow Up Recommendations  Home health PT;Supervision/Assistance - 24 hour     Equipment Recommendations       Recommendations for Other Services       Precautions / Restrictions Precautions Precautions: Fall Restrictions Weight Bearing Restrictions: No    Mobility  Bed Mobility Overal bed mobility: Modified Independent                Transfers Overall transfer level: Needs assistance   Transfers: Sit to/from Stand Sit to Stand: Min guard         General transfer comment: cues for hand placement, rail position and safety  Ambulation/Gait Ambulation/Gait assistance: Min assist Ambulation Distance (Feet): 15 Feet Assistive device: Rolling walker (2 wheeled) Gait Pattern/deviations: Step-through pattern;Decreased stride length;Trunk flexed;Shuffle   Gait velocity interpretation: Below normal speed for age/gender General Gait Details: pt only agreeable to walking to and from bathroom with LOB x 3 in standing at the sink. During gait pt able to maintain balance for  limited distance. When leaving bathroom pt reacing out for door frame to turn RW rather than actually changing position and direction of RW itself   Stairs            Wheelchair Mobility    Modified Rankin (Stroke Patients Only)       Balance Overall balance assessment: Needs assistance Sitting-balance support: No upper extremity supported;Feet supported Sitting balance-Leahy Scale: Good     Standing balance support: Bilateral upper extremity supported;During functional activity Standing balance-Leahy Scale: Poor Standing balance comment: relies on bil UE support for balance.                      Cognition Arousal/Alertness: Awake/alert Behavior During Therapy: WFL for tasks assessed/performed Overall Cognitive Status: Within Functional Limits for tasks assessed                      Exercises      General Comments        Pertinent Vitals/Pain Pain Assessment: 0-10 Pain Score: 4  Pain Location: head Pain Descriptors / Indicators: Aching Pain Intervention(s): Limited activity within patient's tolerance;Monitored during session    Home Living                      Prior Function            PT Goals (current goals can now be found in the care plan section) Progress towards PT goals: Progressing toward goals    Frequency           PT Plan Current plan remains  appropriate    Co-evaluation             End of Session   Activity Tolerance: Patient limited by fatigue Patient left: in bed;with call bell/phone within reach;with bed alarm set     Time: 4098-11911237-1253 PT Time Calculation (min) (ACUTE ONLY): 16 min  Charges:  $Therapeutic Activity: 8-22 mins                    G Codes:      [name] 11/26/2016, 12:58 PM [name], PT [phone]

## 2016-11-27 DIAGNOSIS — R42 Dizziness and giddiness: Secondary | ICD-10-CM | POA: Diagnosis not present

## 2016-11-27 DIAGNOSIS — R51 Headache: Secondary | ICD-10-CM | POA: Diagnosis not present

## 2016-11-27 DIAGNOSIS — R079 Chest pain, unspecified: Secondary | ICD-10-CM | POA: Diagnosis not present

## 2016-11-27 DIAGNOSIS — R220 Localized swelling, mass and lump, head: Secondary | ICD-10-CM | POA: Diagnosis not present

## 2016-11-27 LAB — OVA + PARASITE EXAM

## 2016-11-27 LAB — O&P RESULT

## 2016-11-29 ENCOUNTER — Telehealth: Payer: Self-pay | Admitting: Internal Medicine

## 2016-11-29 ENCOUNTER — Encounter: Payer: Self-pay | Admitting: Internal Medicine

## 2016-11-29 DIAGNOSIS — G4733 Obstructive sleep apnea (adult) (pediatric): Secondary | ICD-10-CM

## 2016-11-29 NOTE — Telephone Encounter (Signed)
Tried to call pt. But phone just rung a fast busy x1

## 2016-11-29 NOTE — Progress Notes (Signed)
Normal. Letter sent.

## 2016-12-02 NOTE — Telephone Encounter (Signed)
Spoke with pt, who is requesting sleep study results from 10-30-16.   CY please advise. Thanks.

## 2016-12-03 DIAGNOSIS — K529 Noninfective gastroenteritis and colitis, unspecified: Secondary | ICD-10-CM | POA: Diagnosis not present

## 2016-12-03 DIAGNOSIS — J449 Chronic obstructive pulmonary disease, unspecified: Secondary | ICD-10-CM | POA: Diagnosis not present

## 2016-12-03 DIAGNOSIS — R197 Diarrhea, unspecified: Secondary | ICD-10-CM | POA: Diagnosis not present

## 2016-12-03 NOTE — Telephone Encounter (Signed)
Spoke with pt. She is aware of results. Order has been placed for CPAP. Nothing further was needed.

## 2016-12-03 NOTE — Telephone Encounter (Signed)
She had severe obstructive sleep apnea, stopping 31 times/ hour on her home slep test in December.  This does qulify her to get CPAP again.  Last info I had was she was [address] [place], serviced by [facility]. That would need to be verified                  Order DME CPAP auto 5-20, mask of choice, humidifier, supplies, AirView    Dx OSA

## 2016-12-06 ENCOUNTER — Telehealth: Payer: Self-pay | Admitting: Internal Medicine

## 2016-12-06 NOTE — Telephone Encounter (Signed)
Patient was offered an appt with an APP, but she declines and prefers to wait to see Dr. Leone [name] 01/13/17 11:30

## 2016-12-10 ENCOUNTER — Ambulatory Visit: Payer: Self-pay | Admitting: Adult Health

## 2016-12-10 DIAGNOSIS — G4700 Insomnia, unspecified: Secondary | ICD-10-CM | POA: Diagnosis not present

## 2016-12-10 DIAGNOSIS — R197 Diarrhea, unspecified: Secondary | ICD-10-CM | POA: Diagnosis not present

## 2016-12-13 DIAGNOSIS — D518 Other vitamin B12 deficiency anemias: Secondary | ICD-10-CM | POA: Diagnosis not present

## 2016-12-13 DIAGNOSIS — E782 Mixed hyperlipidemia: Secondary | ICD-10-CM | POA: Diagnosis not present

## 2016-12-13 DIAGNOSIS — E119 Type 2 diabetes mellitus without complications: Secondary | ICD-10-CM | POA: Diagnosis not present

## 2016-12-13 DIAGNOSIS — E559 Vitamin D deficiency, unspecified: Secondary | ICD-10-CM | POA: Diagnosis not present

## 2016-12-13 DIAGNOSIS — Z79899 Other long term (current) drug therapy: Secondary | ICD-10-CM | POA: Diagnosis not present

## 2016-12-13 DIAGNOSIS — E038 Other specified hypothyroidism: Secondary | ICD-10-CM | POA: Diagnosis not present

## 2016-12-17 ENCOUNTER — Telehealth: Payer: Self-pay | Admitting: *Deleted

## 2016-12-17 ENCOUNTER — Telehealth: Payer: Self-pay | Admitting: Adult Health

## 2016-12-17 ENCOUNTER — Ambulatory Visit: Payer: Self-pay | Admitting: Adult Health

## 2016-12-17 NOTE — Telephone Encounter (Signed)
No showed her follow up appointment. 

## 2016-12-17 NOTE — Telephone Encounter (Signed)
Patient was over 30 minutes late for her apt today. Would like a call back from the nurse regarding her medications. She did make a f/u apt for Feb.  Best call back [phone]

## 2016-12-17 NOTE — Telephone Encounter (Signed)
Returned call to [facility] in [place] [phone]), where the patient resides.  I spoke to [place]le[place], patient's med tech.  At this point, they have no questions concerning her medications.  They are aware of the new appt time on 01/14/17 at 1:30pm.  They will have her at our office at 1:00pm.

## 2016-12-18 ENCOUNTER — Encounter: Payer: Self-pay | Admitting: Adult Health

## 2016-12-19 DIAGNOSIS — E039 Hypothyroidism, unspecified: Secondary | ICD-10-CM | POA: Diagnosis not present

## 2016-12-19 DIAGNOSIS — N183 Chronic kidney disease, stage 3 (moderate): Secondary | ICD-10-CM | POA: Diagnosis not present

## 2016-12-19 DIAGNOSIS — I129 Hypertensive chronic kidney disease with stage 1 through stage 4 chronic kidney disease, or unspecified chronic kidney disease: Secondary | ICD-10-CM | POA: Diagnosis not present

## 2016-12-19 DIAGNOSIS — D509 Iron deficiency anemia, unspecified: Secondary | ICD-10-CM | POA: Diagnosis not present

## 2016-12-19 DIAGNOSIS — N2581 Secondary hyperparathyroidism of renal origin: Secondary | ICD-10-CM | POA: Diagnosis not present

## 2016-12-23 ENCOUNTER — Telehealth: Payer: Self-pay | Admitting: Cardiovascular Disease

## 2016-12-23 NOTE — Telephone Encounter (Signed)
Received records from [facility] for appointment on 01/02/17 with Dr Royann Shiversroitoru.  Records put with Dr Croitoru's schedule for 01/02/17. lp

## 2016-12-24 DIAGNOSIS — M797 Fibromyalgia: Secondary | ICD-10-CM | POA: Diagnosis not present

## 2016-12-24 DIAGNOSIS — G4700 Insomnia, unspecified: Secondary | ICD-10-CM | POA: Diagnosis not present

## 2016-12-24 DIAGNOSIS — R197 Diarrhea, unspecified: Secondary | ICD-10-CM | POA: Diagnosis not present

## 2017-01-02 ENCOUNTER — Ambulatory Visit (INDEPENDENT_AMBULATORY_CARE_PROVIDER_SITE_OTHER): Payer: Medicare Other | Admitting: Cardiovascular Disease

## 2017-01-02 ENCOUNTER — Encounter: Payer: Self-pay | Admitting: Cardiovascular Disease

## 2017-01-02 ENCOUNTER — Telehealth: Payer: Self-pay | Admitting: Internal Medicine

## 2017-01-02 VITALS — BP 142/80 | HR 66 | Ht 63.0 in | Wt 111.2 lb

## 2017-01-02 DIAGNOSIS — R636 Underweight: Secondary | ICD-10-CM | POA: Diagnosis not present

## 2017-01-02 DIAGNOSIS — G4733 Obstructive sleep apnea (adult) (pediatric): Secondary | ICD-10-CM

## 2017-01-02 DIAGNOSIS — N183 Chronic kidney disease, stage 3 unspecified: Secondary | ICD-10-CM

## 2017-01-02 DIAGNOSIS — I471 Supraventricular tachycardia: Secondary | ICD-10-CM

## 2017-01-02 DIAGNOSIS — R413 Other amnesia: Secondary | ICD-10-CM | POA: Diagnosis not present

## 2017-01-02 DIAGNOSIS — I10 Essential (primary) hypertension: Secondary | ICD-10-CM

## 2017-01-02 NOTE — Telephone Encounter (Signed)
CY  Please advise-  Annice PihJackie from [facility] [place] called she stated the pt. Is at their assist living facility and they need an order stating that the pt. Needs this cpap machine and ok to use.

## 2017-01-02 NOTE — Progress Notes (Signed)
Patient ID: Ana FillersJudy C Foster, female   DOB: 1941-10-31, 76 y.o.   MRN: [id number]    Cardiology Office Note    Date:  01/02/2017   ID:  Murtis SinkJudy C Foster, DOB 1941-10-31, MRN [id number]  PCP:  Pcp Not In System  Cardiologist:   Thurmon FairMihai Earnestene Angello, MD   Chief complaint: Deteriorating memory, falls  History of Present Illness:  [name] is a 76 y.o. female with a history of OSA, paroxysmal atrial tachycardia who presents for follow-up.   Her palpitations have improved and really don't bother her at all anymore. Unfortunately she is rapidly losing weight. She has lost another 16 pounds just since November and is now clearly underweight with a BMI of 19. She has anorexia and nausea, occasional vomiting. The symptoms occur even between meals, not just after eating. She also complains of sweats that occur all day and night, "worse than menopause". She has frequent diarrhea. She has fallen about 3 times since Christmas. The episodes are associated with dizziness, but she has not experienced syncope. She does not have palpitations around the time of her falls.  Aricept has had positive impact on her memory. She used to panic because she couldn't remember words, is now calmer.  She has a history of paroxysmal atrial tachycardia up to about 180 bpm. palpitations were a big problem when she was receiving excessive levothyroxine supplementation, but has not had palpitations recently. Most recent TSH had improved, although still at the lower limit of normal at 0.377. Cardiac workup showed normal left ventricular systolic function, normal left atrial size, no valvular abnormalities, no evidence of coronary insufficiency on remote nuclear perfusion study.  Past Medical History:  Diagnosis Date  . Abnormal heart rhythm   . Abnormality of gait 12/21/2015  . Afib (HCC)    h/o  . Anxiety   . Arthritis    "hips, shoulders" (01/05/2015)  . Chronic kidney disease (CKD), stage III (moderate)    [name]/notes 01/05/2015  .  Dementia   . Depression   . Echocardiogram abnormal 02/26/11   MVP,mild MR,AOV mildly sclerotic. EF >55%  . GERD (gastroesophageal reflux disease)   . Hypertension   . Hypothyroidism   . Memory difficulty 06/05/2016  . Nausea & vomiting 11/2016  . Osteoporosis   . Reactive airway disease   . Scoliosis   . Sleep apnea    "suppose to wear a mask; can't tolerate it" (01/05/2015)    Past Surgical History:  Procedure Laterality Date  . CATARACT EXTRACTION, BILATERAL Bilateral   . CESAREAN SECTION  1967  . CESAREAN SECTION  1971  . DILATION AND CURETTAGE OF UTERUS    . FLEXIBLE SIGMOIDOSCOPY N/A 11/24/2016   Procedure: FLEXIBLE SIGMOIDOSCOPY;  Surgeon: Iva Booparl E Gessner, MD;  [facility];  Service: Endoscopy;  Laterality: N/A;  . JOINT REPLACEMENT    . OPEN REDUCTION INTERNAL FIXATION (ORIF) DISTAL RADIAL FRACTURE Left 12/2009   [name]/notes 12/28/2009  . TOTAL KNEE ARTHROPLASTY Left 03/2010   [name]/notes 04/07/2010  . TUBAL LIGATION    . VAGINAL HYSTERECTOMY  1980    Outpatient Medications Prior to Visit  Medication Sig Dispense Refill  . acetaminophen (TYLENOL) 500 MG tablet Take 1,000 mg by mouth every 6 (six) hours as needed for headache (minor discomfort (contact MD if headache or pain persists for more than 24 hours).    . baclofen (LIORESAL) 10 MG tablet Take 5 mg by mouth at bedtime as needed (back spasms).     . budesonide-formoterol (SYMBICORT)  160-4.5 MCG/ACT inhaler Inhale 2 puffs into the lungs 2 (two) times daily. Rinse mouth after use - discard 3 months after opening    . calcitRIOL (ROCALTROL) 0.25 MCG capsule Take 0.25 mcg by mouth every Monday, Wednesday, and Friday. Do not crush    . clonazePAM (KLONOPIN) 2 MG tablet Take 1 tablet (2 mg total) by mouth at bedtime. 30 tablet 5  . clopidogrel (PLAVIX) 75 MG tablet Take 1 tablet (75 mg total) by mouth daily. 30 tablet 5  . diltiazem (CARDIZEM) 30 MG tablet Take 30 mg by mouth at bedtime as needed (palpitations or tachycardia).  Do not crush    . diphenoxylate-atropine (LOMOTIL) 2.5-0.025 MG tablet Take 1 tablet by mouth 3 (three) times daily as needed for diarrhea or loose stools. 30 tablet 0  . donepezil (ARICEPT) 10 MG tablet Take 1 tablet (10 mg total) by mouth at bedtime. 30 tablet 11  . feeding supplement, ENSURE ENLIVE, (ENSURE ENLIVE) LIQD Take 237 mLs by mouth 3 (three) times daily between meals. 237 mL 12  . levothyroxine (SYNTHROID, LEVOTHROID) 112 MCG tablet Take 112 mcg by mouth daily.     . metoprolol tartrate (LOPRESSOR) 25 MG tablet Take 12.5 mg by mouth 2 (two) times daily.     . Omega-3 Fatty Acids (FISH OIL) 1000 MG CAPS Take 1,000 mg by mouth daily. Do not crush    . ondansetron (ZOFRAN) 4 MG tablet Take 4 mg by mouth every 8 (eight) hours as needed for nausea or vomiting.    . polyethylene glycol (MIRALAX / GLYCOLAX) packet Take 17 g by mouth daily as needed (constipation). Mix in 8 oz of water and drink    . Probiotic Product (PROBIOTIC PO) Take 1 tablet by mouth 2 (two) times daily. Do not crush    . sertraline (ZOLOFT) 100 MG tablet Take 150 mg by mouth daily.    . traMADol (ULTRAM) 50 MG tablet Take 50 mg by mouth See admin instructions. Take 1 tablet (50 mg) by mouth 3 times daily, may also take 1 tablet every 8 hours as needed for pain    . traZODone (DESYREL) 100 MG tablet Take 1 tablet (100 mg total) by mouth at bedtime. 30 tablet 12  . pantoprazole (PROTONIX) 40 MG tablet Take 40 mg by mouth daily. Do not crush     No facility-administered medications prior to visit.      Allergies:   Vicodin [hydrocodone-acetaminophen]   Social History   Social History  . Marital status: Divorced    Spouse name: N/A  . Number of children: 2  . Years of education: 12   Occupational History  . retired    Social History Main Topics  . Smoking status: Never Smoker  . Smokeless tobacco: Never Used  . Alcohol use No  . Drug use: No  . Sexual activity: No   Other Topics Concern  . None    Social History Narrative   Patient drinks caffeine twice a month.   Patient is right handed.      Family History:  The patient's family history includes Cancer in her father and mother; Heart disease in her father.   ROS:   Please see the history of present illness.    ROS All other systems reviewed and are negative.   PHYSICAL EXAM:   VS:  BP (!) 142/80 (BP Location: Left Arm, Patient Position: Sitting, Cuff Size: Normal)   Pulse 66   Ht 5\' 3"  (1.6 m)  Wt 50.4 kg (111 lb 3.2 oz)   BMI 19.70 kg/m    GEN: Well nourished, well developed, in no acute distress  HEENT: normal  Neck: no JVD, carotid bruits, or masses Cardiac: RRR; no murmurs, rubs, or gallops,no edema  Respiratory:  clear to auscultation bilaterally, normal work of breathing GI: soft, nontender, nondistended, + BS MS: no deformity or atrophy  Skin: warm and dry, no rash Neuro:  Alert and Oriented x 3, Strength and sensation are intact Psych: euthymic mood, full affect  Wt Readings from Last 3 Encounters:  01/02/17 50.4 kg (111 lb 3.2 oz)  11/26/16 52.9 kg (116 lb 9.6 oz)  09/30/16 57.6 kg (127 lb)      Studies/Labs Reviewed:   EKG:  EKG is not ordered today.  Recent Labs: 11/19/2016: ALT 15; TSH 0.377 11/23/2016: Magnesium 2.1 11/26/2016: BUN 11; Creatinine, Ser 1.83; Hemoglobin 11.2; Platelets 115; Potassium 3.7; Sodium 138   Lipid Panel    Component Value Date/Time   CHOL 196 09/15/2014 1224   TRIG 202 (H) 09/15/2014 1224   HDL 54 09/15/2014 1224   CHOLHDL 3.6 09/15/2014 1224   VLDL 40 09/15/2014 1224   LDLCALC 102 (H) 09/15/2014 1224   [facility] 2018, [place] [facility] Creatinine 1.3, nightly 18, normal LFTs including albumin 4.1, normal urinalysis, hemoglobin 12.7, normal iron studies  ASSESSMENT:    1. Paroxysmal atrial tachycardia (HCC)   2. Essential hypertension   3. Memory difficulty   4. Severely underweight adult   5. Chronic kidney disease, stage 3   6. Obstructive sleep apnea       PLAN:  In order of problems listed above:  1. PAT: Suspect this was precipitated by iatrogenic hyperthyroidism, now resolved. Asymptomatic on metoprolol. Her blood pressure and heart rate are normal. Event monitoring has shown no connection between falls and arrhythmia. Use immediate release diltiazem as needed. If she continues to lose weight and becomes hypotensive will need to reduce the dose of metoprolol 2. HTN: Normal BP on current medication. 3. Vascular dementia: Aricept helped her memory, but may be contributing to her diarrhea.  4. Weight loss/Diarrhea: These problems have not abated even after her dose of thyroid supplement was reduced. She is not troubled by acid reflux or clear-cut gastric pain. The Protonix doesn't seem to be helping with her nausea and vomiting and may actually be contributing to diarrhea. I recommended that she stop this and discuss further treatment with Dr. Leone Payor. Her symptoms do not sound like intestinal angina. I doubt there is a vascular cause for her weight loss and GI disturbances. At that despite her weight loss, albumin was 4.1 on labs checked February 1 in the nephrology clinic 5. CKD: Records from recent follow-up visit with Dr. Arrie Aran reviewed. 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 6. OSA: Her CPAP machine was misplaced when she changed her assisted living facility. She is going to get a new device. I'd consider repeating a titration study since she has lost so much weight. One wonders whether she might have "cured" her sleep apnea.  Medication Adjustments/Labs and Tests Ordered: Current medicines are reviewed at length with the patient today.  Concerns regarding medicines are outlined above.  Medication changes, Labs and Tests ordered today are listed in the Patient Instructions below. Patient Instructions  Dr Royann Shivers has  recommended making the following medication changes: 1. STOP Pantoprazole  Your  physician recommends that you schedule a follow-up appointment in 12 months. You will receive a reminder letter in the mail two months in advance. If you don't receive a letter, please call our office to schedule the follow-up appointment.  If you need a refill on your cardiac medications before your next appointment, please call your pharmacy.      Signed, Thurmon Fair, MD  01/02/2017 10:10 AM    [facility] [address] [place], [place], [place]  [address] Phone: [phone]; Fax: [phone]

## 2017-01-02 NOTE — Patient Instructions (Signed)
Dr Royann Shiversroitoru has recommended making the following medication changes: 1. STOP Pantoprazole  Your physician recommends that you schedule a follow-up appointment in 12 months. You will receive a reminder letter in the mail two months in advance. If you don't receive a letter, please call our office to schedule the follow-up appointment.  If you need a refill on your cardiac medications before your next appointment, please call your pharmacy.

## 2017-01-07 ENCOUNTER — Telehealth: Payer: Self-pay | Admitting: Cardiovascular Disease

## 2017-01-07 NOTE — Telephone Encounter (Signed)
Patient uses CPAP 9 with [facility] as her DME. Goal is to wear it all night, every night for dx obstructive sleep apnea

## 2017-01-07 NOTE — Telephone Encounter (Signed)
[facility]  Nothing further needed.

## 2017-01-07 NOTE — Telephone Encounter (Signed)
Request for surgical clearance:  1. What type of surgery is being performed? Regular Cleaning   2. When is this surgery scheduled? Today (Patient is in the office )   3. Are there any medications that need to be held prior to surgery and how long?Plavix . Does she need to stay on while getting a regular cleaning ?  Name of physician performing surgery? Dr.Wallace   What is your office phone and fax number? Fax#[phone]

## 2017-01-07 NOTE — Telephone Encounter (Signed)
Dr. Earlene PlaterWallace dentist office called with request to clear patient for a routine cleaning and advice on whether to stop plavix or not.  Advised OK to have patient get her teeth cleaned w no interruption to plavix  NFQs, caller expressed understanding and thanks.

## 2017-01-13 ENCOUNTER — Ambulatory Visit (INDEPENDENT_AMBULATORY_CARE_PROVIDER_SITE_OTHER): Payer: Medicare Other | Admitting: Internal Medicine

## 2017-01-13 ENCOUNTER — Encounter: Payer: Self-pay | Admitting: Internal Medicine

## 2017-01-13 VITALS — BP 140/58 | HR 72 | Ht 62.0 in | Wt 108.5 lb

## 2017-01-13 DIAGNOSIS — I700 Atherosclerosis of aorta: Secondary | ICD-10-CM | POA: Diagnosis not present

## 2017-01-13 DIAGNOSIS — R1033 Periumbilical pain: Secondary | ICD-10-CM

## 2017-01-13 DIAGNOSIS — K529 Noninfective gastroenteritis and colitis, unspecified: Secondary | ICD-10-CM

## 2017-01-13 DIAGNOSIS — R634 Abnormal weight loss: Secondary | ICD-10-CM

## 2017-01-13 NOTE — Progress Notes (Signed)
[name] 75 y.o. Jan 11, 1941 [id number]  Assessment & Plan:   1. Chronic diarrhea   2. Loss of weight   3. Periumbilical abdominal pain   4. Abdominal aortic atherosclerosis (HCC)     Is a complicated situation. Patient with multiple comorbidities including atherosclerosis, end-stage renal disease which limits the use of contrast. She has a chronic diarrhea syndrome and loss of weight and what I think is mostly periumbilical abdominal pain. She was hospitalized in January had a CT scan of the abdomen without contrast that was essentially unrevealing though she had some potential thickening of the descending colon, I did a flexible sigmoidoscopy including biopsies that was unrevealing.  I think at this point it is possible that the Aricept is causing her problems though probably not likely is worth a trial at stopping this and reassessing in a few days. If her diarrhea goes away we may have the answer. If it doesn't I'm inclined to workup for ischemia of the mesentery, either with ultrasound imaging versus a noncontrasted MRI. Could be useful to recheck her creatinine and see and discussed with Dr. Arrie Aran re: use of contrast. She could need a full colonoscopy though we best for her to be off Plavix it's not essential, I could look and take biopsies we just would be careful about removing large polyps and perhaps not doing so. Versus clipping the site to reduce the risk of bleeding.   I appreciate the opportunity to care for this patient. CC: Drs. Joe Coladonato, Lesia Sago and [employer]  I have completed info for [employer] of [name] also Subjective:   Chief Complaint:  HPI The patient is a pleasant elderly white woman I know from recent hospitalization in January, she was admitted with diarrhea apparently nausea and vomiting as well and weight loss. She had some thickening of the left colon on CT scan I did  flexible sigmoidoscopy biopsies but they were  unrevealing. At that time she has persisted with postprandial abdominal pain in the periumbilical area and left side, and she says everything she eats runs right through her. She continues to lose weight as reflected below. In the summer she was 137 pounds and a neurology visit in July she is currently residing in an assisted living facility. C. difficile testing stool studies were all negative. She did have abdominal aortic atherosclerosis but we could not do imaging studies with contrast to look for ischemia because of her renal failure though she is not on dialysis. He did seem to improve when she was hospitalized. Perhaps not back to baseline but was better and I had thought maybe she had some sort of gastroenteritis that was a prolonged entity. she stopped pantoprazole at the recommendation of cardiology but that that has not helped her symptoms. Wt Readings from Last 3 Encounters:  01/13/17 108 lb 8 oz (49.2 kg)  01/02/17 111 lb 3.2 oz (50.4 kg)  11/26/16 116 lb 9.6 oz (52.9 kg)   Allergies  Allergen Reactions  . Vicodin [Hydrocodone-Acetaminophen] Nausea And Vomiting   Past Medical History:  Diagnosis Date  . Abnormal heart rhythm   . Abnormality of gait 12/21/2015  . Afib (HCC)    h/o  . Anxiety   . Arthritis    "hips, shoulders" (01/05/2015)  . Chronic kidney disease (CKD), stage III (moderate)    [name] 01/05/2015  . Dementia   . Depression   . Echocardiogram abnormal 02/26/11   MVP,mild MR,AOV mildly sclerotic. EF >55%  . GERD (  gastroesophageal reflux disease)   . Hypertension   . Hypothyroidism   . Memory difficulty 06/05/2016  . Nausea & vomiting 11/2016  . Osteoporosis   . Reactive airway disease   . Scoliosis   . Sleep apnea    "suppose to wear a mask; can't tolerate it" (01/05/2015)   Past Surgical History:  Procedure Laterality Date  . CATARACT EXTRACTION, BILATERAL Bilateral   . CESAREAN SECTION  1967  . CESAREAN SECTION  1971  . DILATION AND CURETTAGE OF UTERUS      . FLEXIBLE SIGMOIDOSCOPY N/A 11/24/2016   Procedure: FLEXIBLE SIGMOIDOSCOPY;  Surgeon: Iva Boop, MD;  Location: [facility] ENDOSCOPY;  Service: Endoscopy;  Laterality: N/A;  . JOINT REPLACEMENT    . OPEN REDUCTION INTERNAL FIXATION (ORIF) DISTAL RADIAL FRACTURE Left 12/2009   [name] 12/28/2009  . TOTAL KNEE ARTHROPLASTY Left 03/2010   [name] 04/07/2010  . TUBAL LIGATION    . VAGINAL HYSTERECTOMY  1980   Current Meds  Medication Sig  . acetaminophen (TYLENOL) 500 MG tablet Take 1,000 mg by mouth every 6 (six) hours as needed for headache (minor discomfort (contact MD if headache or pain persists for more than 24 hours).  . baclofen (LIORESAL) 10 MG tablet Take 5 mg by mouth at bedtime as needed (back spasms).   . budesonide-formoterol (SYMBICORT) 160-4.5 MCG/ACT inhaler Inhale 2 puffs into the lungs 2 (two) times daily. Rinse mouth after use - discard 3 months after opening  . calcitRIOL (ROCALTROL) 0.25 MCG capsule Take 0.25 mcg by mouth every Monday, Wednesday, and Friday. Do not crush  . clonazePAM (KLONOPIN) 2 MG tablet Take 1 tablet (2 mg total) by mouth at bedtime.  . clopidogrel (PLAVIX) 75 MG tablet Take 1 tablet (75 mg total) by mouth daily.  [name] diltiazem (CARDIZEM) 30 MG tablet Take 30 mg by mouth at bedtime as needed (palpitations or tachycardia). Do not crush  . diphenoxylate-atropine (LOMOTIL) 2.5-0.025 MG tablet Take 1 tablet by mouth 3 (three) times daily as needed for diarrhea or loose stools.  [name] levothyroxine (SYNTHROID, LEVOTHROID) 112 MCG tablet Take 112 mcg by mouth daily.   . metoprolol tartrate (LOPRESSOR) 25 MG tablet Take 12.5 mg by mouth 2 (two) times daily.   . Omega-3 Fatty Acids (FISH OIL) 1000 MG CAPS Take 1,000 mg by mouth daily. Do not crush  . ondansetron (ZOFRAN) 4 MG tablet Take 4 mg by mouth every 8 (eight) hours as needed for nausea or vomiting.  . sertraline (ZOLOFT) 100 MG tablet Take 150 mg by mouth daily.  . traMADol (ULTRAM) 50 MG tablet Take 50 mg by mouth  See admin instructions. Take 1 tablet (50 mg) by mouth 3 times daily, may also take 1 tablet every 8 hours as needed for pain  . traZODone (DESYREL) 100 MG tablet Take 1 tablet (100 mg total) by mouth at bedtime.  . [DISCONTINUED] donepezil (ARICEPT) 10 MG tablet Take 1 tablet (10 mg total) by mouth at bedtime.   Medications, allergies, past medical history, past surgical history, family history and social history are reviewed and updated in the EMR.   Review of Systems Not Sure if Aricept is helping her memory disturbance.  Objective:   Physical Exam BP (!) 140/58   Pulse 72   Ht 5\' 2"  (1.575 m)   Wt 108 lb 8 oz (49.2 kg)   BMI 19.84 kg/m  Elderly white woman no acute distress looking stated age Eyes are anicteric Abdomen is soft bowel sounds are increased, there  are no bruits, she has some mild left lower quadrant and periumbilical tenderness there are well-healed surgical scars in the abdomen Skin turgor is normal No peripheral edema He appears alert and oriented and has an appropriate mood and affect. She will ask some of the same questions more than once.      \   Reviewed as above, I reviewed the discharge summary from the January 2018 hospitalization have also reviewed her etiology visit of 2/15

## 2017-01-13 NOTE — Patient Instructions (Signed)
   Per Dr Leone PayorGessner stop the Aricept and call us Thursday or Friday with an update on the diarrhea.  Ask for Darcey NoraSheri Jones, RN     I appreciate the opportunity to care for you. Stan Headarl Gessner, MD, [facility]

## 2017-01-14 ENCOUNTER — Encounter: Payer: Self-pay | Admitting: Adult Health

## 2017-01-14 ENCOUNTER — Ambulatory Visit (INDEPENDENT_AMBULATORY_CARE_PROVIDER_SITE_OTHER): Payer: Medicare Other | Admitting: Adult Health

## 2017-01-14 VITALS — BP 144/72 | HR 72 | Ht 62.0 in | Wt 109.2 lb

## 2017-01-14 DIAGNOSIS — R269 Unspecified abnormalities of gait and mobility: Secondary | ICD-10-CM

## 2017-01-14 DIAGNOSIS — R413 Other amnesia: Secondary | ICD-10-CM | POA: Diagnosis not present

## 2017-01-14 NOTE — Progress Notes (Signed)
I have read the note, and I agree with the clinical assessment and plan.  Daniela Hernan KEITH   

## 2017-01-14 NOTE — Patient Instructions (Signed)
Memory score is slightly declined May consider Namenda in the future but this will need to be cleared with urologist Use walker at all times. If you continue to have frequent falls please let us know. If your symptoms worsen or you develop new symptoms please let us know.

## 2017-01-14 NOTE — Progress Notes (Signed)
PATIENT: [name] DOB: 10-Nov-1941  REASON FOR VISIT: follow up- chronic gait disorder, memory disturbance HISTORY FROM: patient  HISTORY OF PRESENT ILLNESS: Ms [name] is a 76 year old female with a history of chronic gait disorder and memory disturbance. She returns today for follow-up. She states that she has significant weight loss within the last 6 months and chronic diarrhea. She saw Dr. Leone Payor and all of his tests have been unremarkable. He  advised that the patient stop Aricept for several days to see if the diarrhea subsides. The patient felt that her memory was better on Aricept. She continues to live in an assisted living facility. She is able to complete all ADLs independently. Reports that she does not operate a motor vehicle. She states in her home she is able to prepare meals without difficulty although she eats most meals at the facility. She states that she sleeps well most nights although she does have some restless nights. Denies hallucinations. Reports that she is using Plavix. She states that she has had 2 falls within the last 2 weeks. She is unsure what caused the falls. She states that she does use a walker however symptoms in her home she will use her furniture. Fortunately she did not suffer any injuries. She returns today for evaluation.  HISTORY 06/05/16: [name] is a 76 year old right-handed white female with a history of a chronic gait disorder. The patient uses a walker for ambulation, she has been safe with the walking without any falls since last seen in February 2017. The patient has continued to have problems with her memory. She recently has developed some issues with urinary urgency. She has undergone MRA evaluation of the brain that was unremarkable, but the MRI of the brain that was ordered through this office was never done. CT of the head done in December 2016 shows extensive white matter changes. It was felt that the memory issues, balance issues,  and urinary control issues are related to this small vessel disease. The patient is not on any antiplatelet agents. She returns to this office for an evaluation. She believes that her memory issues have been relatively stable since she entered her assisted living facility in October 2016.  REVIEW OF SYSTEMS: Out of a complete 14 system review of symptoms, the patient complains only of the following symptoms, and all other reviewed systems are negative.  Abdominal pain, constipation, diarrhea, nausea, vomiting, insomnia, apnea, neck pain, incontinence of bladder, bruise/bleed easily, anemia dizziness, headache  ALLERGIES: Allergies  Allergen Reactions  . Vicodin [Hydrocodone-Acetaminophen] Nausea And Vomiting    HOME MEDICATIONS: Outpatient Medications Prior to Visit  Medication Sig Dispense Refill  . acetaminophen (TYLENOL) 500 MG tablet Take 1,000 mg by mouth every 6 (six) hours as needed for headache (minor discomfort (contact MD if headache or pain persists for more than 24 hours).    . baclofen (LIORESAL) 10 MG tablet Take 5 mg by mouth at bedtime as needed (back spasms).     . budesonide-formoterol (SYMBICORT) 160-4.5 MCG/ACT inhaler Inhale 2 puffs into the lungs 2 (two) times daily. Rinse mouth after use - discard 3 months after opening    . calcitRIOL (ROCALTROL) 0.25 MCG capsule Take 0.25 mcg by mouth every Monday, Wednesday, and Friday. Do not crush    . clonazePAM (KLONOPIN) 2 MG tablet Take 1 tablet (2 mg total) by mouth at bedtime. 30 tablet 5  . clopidogrel (PLAVIX) 75 MG tablet Take 1 tablet (75 mg total) by mouth daily. 30  tablet 5  . diltiazem (CARDIZEM) 30 MG tablet Take 30 mg by mouth at bedtime as needed (palpitations or tachycardia). Do not crush    . diphenoxylate-atropine (LOMOTIL) 2.5-0.025 MG tablet Take 1 tablet by mouth 3 (three) times daily as needed for diarrhea or loose stools. 30 tablet 0  . feeding supplement, ENSURE ENLIVE, (ENSURE ENLIVE) LIQD Take 237 mLs by  mouth 3 (three) times daily between meals. 237 mL 12  . levothyroxine (SYNTHROID, LEVOTHROID) 112 MCG tablet Take 112 mcg by mouth daily.     . metoprolol tartrate (LOPRESSOR) 25 MG tablet Take 12.5 mg by mouth 2 (two) times daily.     . Omega-3 Fatty Acids (FISH OIL) 1000 MG CAPS Take 1,000 mg by mouth daily. Do not crush    . ondansetron (ZOFRAN) 4 MG tablet Take 4 mg by mouth every 8 (eight) hours as needed for nausea or vomiting.    . polyethylene glycol (MIRALAX / GLYCOLAX) packet Take 17 g by mouth daily as needed (constipation). Mix in 8 oz of water and drink    . sertraline (ZOLOFT) 100 MG tablet Take 150 mg by mouth daily.    . traMADol (ULTRAM) 50 MG tablet Take 50 mg by mouth See admin instructions. Take 1 tablet (50 mg) by mouth 3 times daily, may also take 1 tablet every 8 hours as needed for pain    . traZODone (DESYREL) 100 MG tablet Take 1 tablet (100 mg total) by mouth at bedtime. 30 tablet 12   No facility-administered medications prior to visit.     PAST MEDICAL HISTORY: Past Medical History:  Diagnosis Date  . Abnormal heart rhythm   . Abnormality of gait 12/21/2015  . Afib (HCC)    h/o  . Anxiety   . Arthritis    "hips, shoulders" (01/05/2015)  . Chronic kidney disease (CKD), stage III (moderate)    [name]/notes 01/05/2015  . Dementia   . Depression   . Echocardiogram abnormal 02/26/11   MVP,mild MR,AOV mildly sclerotic. EF >55%  . GERD (gastroesophageal reflux disease)   . Hypertension   . Hypothyroidism   . Memory difficulty 06/05/2016  . Nausea & vomiting 11/2016  . Osteoporosis   . Reactive airway disease   . Scoliosis   . Sleep apnea    "suppose to wear a mask; can't tolerate it" (01/05/2015)    PAST SURGICAL HISTORY: Past Surgical History:  Procedure Laterality Date  . CATARACT EXTRACTION, BILATERAL Bilateral   . CESAREAN SECTION  1967  . CESAREAN SECTION  1971  . DILATION AND CURETTAGE OF UTERUS    . FLEXIBLE SIGMOIDOSCOPY N/A 11/24/2016   Procedure:  FLEXIBLE SIGMOIDOSCOPY;  Surgeon: Iva Booparl E Gessner, MD;  [facility];  Service: Endoscopy;  Laterality: N/A;  . JOINT REPLACEMENT    . OPEN REDUCTION INTERNAL FIXATION (ORIF) DISTAL RADIAL FRACTURE Left 12/2009   [name]/notes 12/28/2009  . TOTAL KNEE ARTHROPLASTY Left 03/2010   [name]/notes 04/07/2010  . TUBAL LIGATION    . VAGINAL HYSTERECTOMY  1980    FAMILY HISTORY: Family History  Problem Relation Age of Onset  . Cancer Mother   . Heart disease Father   . Cancer Father     SOCIAL HISTORY: Social History   Social History  . Marital status: Divorced    Spouse name: N/A  . Number of children: 2  . Years of education: 12   Occupational History  . retired    Social History Main Topics  . Smoking status: Never  Smoker  . Smokeless tobacco: Never Used  . Alcohol use No  . Drug use: No  . Sexual activity: No   Other Topics Concern  . Not on file   Social History Narrative   Patient drinks caffeine twice a month.   Patient is right handed.       PHYSICAL EXAM  Vitals:   01/14/17 1252  BP: (!) 144/72  Pulse: 72  Weight: 109 lb 3.2 oz (49.5 kg)  Height: 5\' 2"  (1.575 m)   Body mass index is 19.97 kg/m.   MMSE - Mini Mental State Exam 01/14/2017  Orientation to time 3  Orientation to Place 5  Registration 3  Attention/ Calculation 5  Recall 1  Language- name 2 objects 2  Language- repeat 1  Language- follow 3 step command 2  Language- read & follow direction 1  Write a sentence 1  Copy design 1  Total score 25     Generalized: Well developed, in no acute distress   Neurological examination  Mentation: Alert. Follows all commands speech and language fluent Cranial nerve II-XII: Pupils were equal round reactive to light. Extraocular movements were full, visual field were full on confrontational test. Facial sensation and strength were normal. Uvula tongue midline. Head turning and shoulder shrug  were normal and symmetric. Motor: The motor testing reveals 5  over 5 strength of all 4 extremities. Good symmetric motor tone is noted throughout.  Sensory: Sensory testing is intact to soft touch on all 4 extremities. No evidence of extinction is noted.  Coordination: Cerebellar testing reveals good finger-nose-finger and heel-to-shin bilaterally.  Gait and station: Patient uses a walker when ambulating. Tandem gait not attempted. Reflexes: Deep tendon reflexes are symmetric and normal bilaterally.   DIAGNOSTIC DATA (LABS, IMAGING, TESTING) - I reviewed patient records, labs, notes, testing and imaging myself where available.  Lab Results  Component Value Date   WBC 5.0 11/26/2016   HGB 11.2 (L) 11/26/2016   HCT 34.2 (L) 11/26/2016   MCV 94.0 11/26/2016   PLT 115 (L) 11/26/2016      Component Value Date/Time   NA 138 11/26/2016 0807   K 3.7 11/26/2016 0807   CL 102 11/26/2016 0807   CO2 29 11/26/2016 0807   GLUCOSE 90 11/26/2016 0807   BUN 11 11/26/2016 0807   CREATININE 1.83 (H) 11/26/2016 0807   CREATININE 2.28 (H) 09/15/2014 1224   CALCIUM 9.5 11/26/2016 0807   PROT 5.5 (L) 11/19/2016 0404   ALBUMIN 2.8 (L) 11/19/2016 0404   AST 20 11/19/2016 0404   ALT 15 11/19/2016 0404   ALKPHOS 41 11/19/2016 0404   BILITOT 1.1 11/19/2016 0404   GFRNONAA 26 (L) 11/26/2016 0807   GFRAA 30 (L) 11/26/2016 0807    Lab Results  Component Value Date   VITAMINB12 366 12/21/2015   Lab Results  Component Value Date   TSH 0.377 11/19/2016      ASSESSMENT AND PLAN 76 y.o. year old female  has a past medical history of Abnormal heart rhythm; Abnormality of gait (12/21/2015); Afib (HCC); Anxiety; Arthritis; Chronic kidney disease (CKD), stage III (moderate); Dementia; Depression; Echocardiogram abnormal (02/26/11); GERD (gastroesophageal reflux disease); Hypertension; Hypothyroidism; Memory difficulty (06/05/2016); Nausea & vomiting (11/2016); Osteoporosis; Reactive airway disease; Scoliosis; and Sleep apnea. here with:  1. Chronic gait disorder 2.  Memory disturbance  The patient's memory score has slightly declined since the last visit. She is currently off of Aricept to see if her diarrhea subsides. If the patient is not  able to resume Aricept we may consider Namenda in the future. However she does have chronic kidney disease stage III. I will consult with her nephrologist before starting Namenda. She is encouraged to use her walker at all times. If she continues to have frequent falls she should let us know. Patient is amenable to this plan. F/u in 6 months or sooner if needed.    Butch Penny, MSN, NP-C 01/14/2017, 1:07 PM [facility] [address] [place], [place] [address] [phone]

## 2017-01-20 ENCOUNTER — Telehealth: Payer: Self-pay | Admitting: Internal Medicine

## 2017-01-20 ENCOUNTER — Telehealth: Payer: Self-pay | Admitting: Adult Health

## 2017-01-20 MED ORDER — MEMANTINE HCL 28 X 5 MG & 21 X 10 MG PO TABS: ORAL_TABLET | ORAL | 0 refills | Status: DC

## 2017-01-20 NOTE — Addendum Note (Signed)
Addended by: Stephanie AcreWILLIS, Tegan Britain on: 01/20/2017 03:14 PM   Modules accepted: Orders

## 2017-01-20 NOTE — Telephone Encounter (Signed)
I called patient. The patient has done better with her diarrhea off of the Aricept. She still has some nausea. We will go ahead and get her on Namenda, starting her on a titration pack. The patient will call in one month ago on the maintenance dose of medication.

## 2017-01-20 NOTE — Telephone Encounter (Signed)
Patient calling to discuss how she feels since stopping Aricept.  She says is doing better, having some diarrhea and some pain in her stomach.

## 2017-01-20 NOTE — Telephone Encounter (Signed)
Error

## 2017-01-30 DIAGNOSIS — G4733 Obstructive sleep apnea (adult) (pediatric): Secondary | ICD-10-CM | POA: Diagnosis not present

## 2017-02-03 DIAGNOSIS — G4733 Obstructive sleep apnea (adult) (pediatric): Secondary | ICD-10-CM | POA: Diagnosis not present

## 2017-02-07 DIAGNOSIS — M4126 Other idiopathic scoliosis, lumbar region: Secondary | ICD-10-CM | POA: Diagnosis not present

## 2017-02-07 DIAGNOSIS — E038 Other specified hypothyroidism: Secondary | ICD-10-CM | POA: Diagnosis not present

## 2017-02-07 DIAGNOSIS — I132 Hypertensive heart and chronic kidney disease with heart failure and with stage 5 chronic kidney disease, or end stage renal disease: Secondary | ICD-10-CM | POA: Diagnosis not present

## 2017-02-07 DIAGNOSIS — M1250 Traumatic arthropathy, unspecified site: Secondary | ICD-10-CM | POA: Diagnosis not present

## 2017-02-07 DIAGNOSIS — R262 Difficulty in walking, not elsewhere classified: Secondary | ICD-10-CM | POA: Diagnosis not present

## 2017-02-12 DIAGNOSIS — G4733 Obstructive sleep apnea (adult) (pediatric): Secondary | ICD-10-CM | POA: Diagnosis not present

## 2017-02-27 ENCOUNTER — Telehealth: Payer: Self-pay | Admitting: Cardiovascular Disease

## 2017-02-27 NOTE — Telephone Encounter (Signed)
Returned the phone call to the patient. She wanted to verify that she was to take the 30 mg Diltiazem as needed for palpitations and tachycardia. She verbalized her understanding of when to take this medication.

## 2017-02-27 NOTE — Telephone Encounter (Signed)
New Message     Did you tell her to stop both pills for the  diltiazem (CARDIZEM) 30 MG tablet Take 30 mg by mouth at bedtime as needed (palpitations or tachycardia). Do not crush   She was taking 180 mg  And 30 mgs , The assisted living home told her that she is not to take it anymore , is this correct

## 2017-03-02 DIAGNOSIS — G4733 Obstructive sleep apnea (adult) (pediatric): Secondary | ICD-10-CM | POA: Diagnosis not present

## 2017-03-05 DIAGNOSIS — G4733 Obstructive sleep apnea (adult) (pediatric): Secondary | ICD-10-CM | POA: Diagnosis not present

## 2017-03-12 DIAGNOSIS — T148XXA Other injury of unspecified body region, initial encounter: Secondary | ICD-10-CM | POA: Diagnosis not present

## 2017-03-12 DIAGNOSIS — S299XXA Unspecified injury of thorax, initial encounter: Secondary | ICD-10-CM | POA: Diagnosis not present

## 2017-03-12 DIAGNOSIS — S79919A Unspecified injury of unspecified hip, initial encounter: Secondary | ICD-10-CM | POA: Diagnosis not present

## 2017-03-12 DIAGNOSIS — M79605 Pain in left leg: Secondary | ICD-10-CM | POA: Diagnosis not present

## 2017-03-12 DIAGNOSIS — S79921A Unspecified injury of right thigh, initial encounter: Secondary | ICD-10-CM | POA: Diagnosis not present

## 2017-03-12 DIAGNOSIS — M542 Cervicalgia: Secondary | ICD-10-CM | POA: Diagnosis not present

## 2017-03-12 DIAGNOSIS — S3992XA Unspecified injury of lower back, initial encounter: Secondary | ICD-10-CM | POA: Diagnosis not present

## 2017-03-12 DIAGNOSIS — M25551 Pain in right hip: Secondary | ICD-10-CM | POA: Diagnosis not present

## 2017-03-12 DIAGNOSIS — S199XXA Unspecified injury of neck, initial encounter: Secondary | ICD-10-CM | POA: Diagnosis not present

## 2017-03-12 DIAGNOSIS — M545 Low back pain: Secondary | ICD-10-CM | POA: Diagnosis not present

## 2017-03-12 DIAGNOSIS — S335XXA Sprain of ligaments of lumbar spine, initial encounter: Secondary | ICD-10-CM | POA: Diagnosis not present

## 2017-03-12 DIAGNOSIS — M25552 Pain in left hip: Secondary | ICD-10-CM | POA: Diagnosis not present

## 2017-03-12 DIAGNOSIS — R102 Pelvic and perineal pain: Secondary | ICD-10-CM | POA: Diagnosis not present

## 2017-03-12 DIAGNOSIS — S3991XA Unspecified injury of abdomen, initial encounter: Secondary | ICD-10-CM | POA: Diagnosis not present

## 2017-03-14 DIAGNOSIS — G308 Other Alzheimer's disease: Secondary | ICD-10-CM | POA: Diagnosis not present

## 2017-03-14 DIAGNOSIS — M79641 Pain in right hand: Secondary | ICD-10-CM | POA: Diagnosis not present

## 2017-03-14 DIAGNOSIS — I132 Hypertensive heart and chronic kidney disease with heart failure and with stage 5 chronic kidney disease, or end stage renal disease: Secondary | ICD-10-CM | POA: Diagnosis not present

## 2017-03-14 DIAGNOSIS — Z0000 Encounter for general adult medical examination without abnormal findings: Secondary | ICD-10-CM | POA: Diagnosis not present

## 2017-03-14 DIAGNOSIS — Z723 Lack of physical exercise: Secondary | ICD-10-CM | POA: Diagnosis not present

## 2017-03-20 DIAGNOSIS — R2689 Other abnormalities of gait and mobility: Secondary | ICD-10-CM | POA: Diagnosis not present

## 2017-03-20 DIAGNOSIS — M545 Low back pain: Secondary | ICD-10-CM | POA: Diagnosis not present

## 2017-03-20 DIAGNOSIS — R296 Repeated falls: Secondary | ICD-10-CM | POA: Diagnosis not present

## 2017-03-20 DIAGNOSIS — M1990 Unspecified osteoarthritis, unspecified site: Secondary | ICD-10-CM | POA: Diagnosis not present

## 2017-03-20 DIAGNOSIS — I129 Hypertensive chronic kidney disease with stage 1 through stage 4 chronic kidney disease, or unspecified chronic kidney disease: Secondary | ICD-10-CM | POA: Diagnosis not present

## 2017-03-20 DIAGNOSIS — Z9181 History of falling: Secondary | ICD-10-CM | POA: Diagnosis not present

## 2017-03-20 DIAGNOSIS — Z79891 Long term (current) use of opiate analgesic: Secondary | ICD-10-CM | POA: Diagnosis not present

## 2017-03-20 DIAGNOSIS — N189 Chronic kidney disease, unspecified: Secondary | ICD-10-CM | POA: Diagnosis not present

## 2017-03-20 DIAGNOSIS — M6281 Muscle weakness (generalized): Secondary | ICD-10-CM | POA: Diagnosis not present

## 2017-03-26 DIAGNOSIS — R296 Repeated falls: Secondary | ICD-10-CM | POA: Diagnosis not present

## 2017-03-26 DIAGNOSIS — N189 Chronic kidney disease, unspecified: Secondary | ICD-10-CM | POA: Diagnosis not present

## 2017-03-26 DIAGNOSIS — M545 Low back pain: Secondary | ICD-10-CM | POA: Diagnosis not present

## 2017-03-26 DIAGNOSIS — M1990 Unspecified osteoarthritis, unspecified site: Secondary | ICD-10-CM | POA: Diagnosis not present

## 2017-03-26 DIAGNOSIS — Z79891 Long term (current) use of opiate analgesic: Secondary | ICD-10-CM | POA: Diagnosis not present

## 2017-03-26 DIAGNOSIS — I129 Hypertensive chronic kidney disease with stage 1 through stage 4 chronic kidney disease, or unspecified chronic kidney disease: Secondary | ICD-10-CM | POA: Diagnosis not present

## 2017-03-26 DIAGNOSIS — Z9181 History of falling: Secondary | ICD-10-CM | POA: Diagnosis not present

## 2017-03-26 DIAGNOSIS — R2689 Other abnormalities of gait and mobility: Secondary | ICD-10-CM | POA: Diagnosis not present

## 2017-03-26 DIAGNOSIS — M6281 Muscle weakness (generalized): Secondary | ICD-10-CM | POA: Diagnosis not present

## 2017-03-28 DIAGNOSIS — N189 Chronic kidney disease, unspecified: Secondary | ICD-10-CM | POA: Diagnosis not present

## 2017-03-28 DIAGNOSIS — Z9181 History of falling: Secondary | ICD-10-CM | POA: Diagnosis not present

## 2017-03-28 DIAGNOSIS — M1990 Unspecified osteoarthritis, unspecified site: Secondary | ICD-10-CM | POA: Diagnosis not present

## 2017-03-28 DIAGNOSIS — R2689 Other abnormalities of gait and mobility: Secondary | ICD-10-CM | POA: Diagnosis not present

## 2017-03-28 DIAGNOSIS — M545 Low back pain: Secondary | ICD-10-CM | POA: Diagnosis not present

## 2017-03-28 DIAGNOSIS — Z79891 Long term (current) use of opiate analgesic: Secondary | ICD-10-CM | POA: Diagnosis not present

## 2017-03-28 DIAGNOSIS — R296 Repeated falls: Secondary | ICD-10-CM | POA: Diagnosis not present

## 2017-03-28 DIAGNOSIS — I129 Hypertensive chronic kidney disease with stage 1 through stage 4 chronic kidney disease, or unspecified chronic kidney disease: Secondary | ICD-10-CM | POA: Diagnosis not present

## 2017-03-28 DIAGNOSIS — M6281 Muscle weakness (generalized): Secondary | ICD-10-CM | POA: Diagnosis not present

## 2017-03-31 DIAGNOSIS — N189 Chronic kidney disease, unspecified: Secondary | ICD-10-CM | POA: Diagnosis not present

## 2017-03-31 DIAGNOSIS — I129 Hypertensive chronic kidney disease with stage 1 through stage 4 chronic kidney disease, or unspecified chronic kidney disease: Secondary | ICD-10-CM | POA: Diagnosis not present

## 2017-03-31 DIAGNOSIS — Z9181 History of falling: Secondary | ICD-10-CM | POA: Diagnosis not present

## 2017-03-31 DIAGNOSIS — M545 Low back pain: Secondary | ICD-10-CM | POA: Diagnosis not present

## 2017-03-31 DIAGNOSIS — M1990 Unspecified osteoarthritis, unspecified site: Secondary | ICD-10-CM | POA: Diagnosis not present

## 2017-03-31 DIAGNOSIS — R2689 Other abnormalities of gait and mobility: Secondary | ICD-10-CM | POA: Diagnosis not present

## 2017-03-31 DIAGNOSIS — R296 Repeated falls: Secondary | ICD-10-CM | POA: Diagnosis not present

## 2017-03-31 DIAGNOSIS — M6281 Muscle weakness (generalized): Secondary | ICD-10-CM | POA: Diagnosis not present

## 2017-03-31 DIAGNOSIS — Z79891 Long term (current) use of opiate analgesic: Secondary | ICD-10-CM | POA: Diagnosis not present

## 2017-04-01 DIAGNOSIS — H26492 Other secondary cataract, left eye: Secondary | ICD-10-CM | POA: Diagnosis not present

## 2017-04-01 DIAGNOSIS — G4733 Obstructive sleep apnea (adult) (pediatric): Secondary | ICD-10-CM | POA: Diagnosis not present

## 2017-04-01 DIAGNOSIS — H26491 Other secondary cataract, right eye: Secondary | ICD-10-CM | POA: Diagnosis not present

## 2017-04-01 DIAGNOSIS — H353131 Nonexudative age-related macular degeneration, bilateral, early dry stage: Secondary | ICD-10-CM | POA: Diagnosis not present

## 2017-04-01 DIAGNOSIS — Z961 Presence of intraocular lens: Secondary | ICD-10-CM | POA: Diagnosis not present

## 2017-04-02 DIAGNOSIS — Z79891 Long term (current) use of opiate analgesic: Secondary | ICD-10-CM | POA: Diagnosis not present

## 2017-04-02 DIAGNOSIS — R2689 Other abnormalities of gait and mobility: Secondary | ICD-10-CM | POA: Diagnosis not present

## 2017-04-02 DIAGNOSIS — M545 Low back pain: Secondary | ICD-10-CM | POA: Diagnosis not present

## 2017-04-02 DIAGNOSIS — M6281 Muscle weakness (generalized): Secondary | ICD-10-CM | POA: Diagnosis not present

## 2017-04-02 DIAGNOSIS — I129 Hypertensive chronic kidney disease with stage 1 through stage 4 chronic kidney disease, or unspecified chronic kidney disease: Secondary | ICD-10-CM | POA: Diagnosis not present

## 2017-04-02 DIAGNOSIS — M1990 Unspecified osteoarthritis, unspecified site: Secondary | ICD-10-CM | POA: Diagnosis not present

## 2017-04-02 DIAGNOSIS — N189 Chronic kidney disease, unspecified: Secondary | ICD-10-CM | POA: Diagnosis not present

## 2017-04-02 DIAGNOSIS — Z9181 History of falling: Secondary | ICD-10-CM | POA: Diagnosis not present

## 2017-04-02 DIAGNOSIS — R296 Repeated falls: Secondary | ICD-10-CM | POA: Diagnosis not present

## 2017-04-07 DIAGNOSIS — I129 Hypertensive chronic kidney disease with stage 1 through stage 4 chronic kidney disease, or unspecified chronic kidney disease: Secondary | ICD-10-CM | POA: Diagnosis not present

## 2017-04-07 DIAGNOSIS — N189 Chronic kidney disease, unspecified: Secondary | ICD-10-CM | POA: Diagnosis not present

## 2017-04-07 DIAGNOSIS — Z9181 History of falling: Secondary | ICD-10-CM | POA: Diagnosis not present

## 2017-04-07 DIAGNOSIS — R296 Repeated falls: Secondary | ICD-10-CM | POA: Diagnosis not present

## 2017-04-07 DIAGNOSIS — R2689 Other abnormalities of gait and mobility: Secondary | ICD-10-CM | POA: Diagnosis not present

## 2017-04-07 DIAGNOSIS — M1990 Unspecified osteoarthritis, unspecified site: Secondary | ICD-10-CM | POA: Diagnosis not present

## 2017-04-07 DIAGNOSIS — M545 Low back pain: Secondary | ICD-10-CM | POA: Diagnosis not present

## 2017-04-07 DIAGNOSIS — Z79891 Long term (current) use of opiate analgesic: Secondary | ICD-10-CM | POA: Diagnosis not present

## 2017-04-07 DIAGNOSIS — M6281 Muscle weakness (generalized): Secondary | ICD-10-CM | POA: Diagnosis not present

## 2017-04-10 DIAGNOSIS — M6281 Muscle weakness (generalized): Secondary | ICD-10-CM | POA: Diagnosis not present

## 2017-04-10 DIAGNOSIS — I129 Hypertensive chronic kidney disease with stage 1 through stage 4 chronic kidney disease, or unspecified chronic kidney disease: Secondary | ICD-10-CM | POA: Diagnosis not present

## 2017-04-10 DIAGNOSIS — R296 Repeated falls: Secondary | ICD-10-CM | POA: Diagnosis not present

## 2017-04-10 DIAGNOSIS — R2689 Other abnormalities of gait and mobility: Secondary | ICD-10-CM | POA: Diagnosis not present

## 2017-04-10 DIAGNOSIS — Z9181 History of falling: Secondary | ICD-10-CM | POA: Diagnosis not present

## 2017-04-10 DIAGNOSIS — M545 Low back pain: Secondary | ICD-10-CM | POA: Diagnosis not present

## 2017-04-10 DIAGNOSIS — M1990 Unspecified osteoarthritis, unspecified site: Secondary | ICD-10-CM | POA: Diagnosis not present

## 2017-04-10 DIAGNOSIS — Z79891 Long term (current) use of opiate analgesic: Secondary | ICD-10-CM | POA: Diagnosis not present

## 2017-04-10 DIAGNOSIS — N189 Chronic kidney disease, unspecified: Secondary | ICD-10-CM | POA: Diagnosis not present

## 2017-04-14 DIAGNOSIS — M1990 Unspecified osteoarthritis, unspecified site: Secondary | ICD-10-CM | POA: Diagnosis not present

## 2017-04-14 DIAGNOSIS — Z79891 Long term (current) use of opiate analgesic: Secondary | ICD-10-CM | POA: Diagnosis not present

## 2017-04-14 DIAGNOSIS — R2689 Other abnormalities of gait and mobility: Secondary | ICD-10-CM | POA: Diagnosis not present

## 2017-04-14 DIAGNOSIS — N189 Chronic kidney disease, unspecified: Secondary | ICD-10-CM | POA: Diagnosis not present

## 2017-04-14 DIAGNOSIS — I129 Hypertensive chronic kidney disease with stage 1 through stage 4 chronic kidney disease, or unspecified chronic kidney disease: Secondary | ICD-10-CM | POA: Diagnosis not present

## 2017-04-14 DIAGNOSIS — M545 Low back pain: Secondary | ICD-10-CM | POA: Diagnosis not present

## 2017-04-14 DIAGNOSIS — M6281 Muscle weakness (generalized): Secondary | ICD-10-CM | POA: Diagnosis not present

## 2017-04-14 DIAGNOSIS — R296 Repeated falls: Secondary | ICD-10-CM | POA: Diagnosis not present

## 2017-04-14 DIAGNOSIS — Z9181 History of falling: Secondary | ICD-10-CM | POA: Diagnosis not present

## 2017-04-16 DIAGNOSIS — Z9181 History of falling: Secondary | ICD-10-CM | POA: Diagnosis not present

## 2017-04-16 DIAGNOSIS — N189 Chronic kidney disease, unspecified: Secondary | ICD-10-CM | POA: Diagnosis not present

## 2017-04-16 DIAGNOSIS — M1990 Unspecified osteoarthritis, unspecified site: Secondary | ICD-10-CM | POA: Diagnosis not present

## 2017-04-16 DIAGNOSIS — I129 Hypertensive chronic kidney disease with stage 1 through stage 4 chronic kidney disease, or unspecified chronic kidney disease: Secondary | ICD-10-CM | POA: Diagnosis not present

## 2017-04-16 DIAGNOSIS — Z79891 Long term (current) use of opiate analgesic: Secondary | ICD-10-CM | POA: Diagnosis not present

## 2017-04-16 DIAGNOSIS — R2689 Other abnormalities of gait and mobility: Secondary | ICD-10-CM | POA: Diagnosis not present

## 2017-04-16 DIAGNOSIS — M6281 Muscle weakness (generalized): Secondary | ICD-10-CM | POA: Diagnosis not present

## 2017-04-16 DIAGNOSIS — M545 Low back pain: Secondary | ICD-10-CM | POA: Diagnosis not present

## 2017-04-16 DIAGNOSIS — R296 Repeated falls: Secondary | ICD-10-CM | POA: Diagnosis not present

## 2017-04-21 ENCOUNTER — Ambulatory Visit: Payer: Self-pay | Admitting: Internal Medicine

## 2017-04-22 ENCOUNTER — Ambulatory Visit (INDEPENDENT_AMBULATORY_CARE_PROVIDER_SITE_OTHER): Payer: Medicare Other | Admitting: Internal Medicine

## 2017-04-22 ENCOUNTER — Encounter: Payer: Self-pay | Admitting: Internal Medicine

## 2017-04-22 VITALS — BP 150/100 | HR 108 | Resp 18 | Ht 63.0 in | Wt 112.6 lb

## 2017-04-22 DIAGNOSIS — G2581 Restless legs syndrome: Secondary | ICD-10-CM | POA: Diagnosis not present

## 2017-04-22 DIAGNOSIS — G4733 Obstructive sleep apnea (adult) (pediatric): Secondary | ICD-10-CM | POA: Diagnosis not present

## 2017-04-22 NOTE — Assessment & Plan Note (Signed)
She describes excellent control with clonazepam which has also helped with previous insomnia problem. No changes needed for now.

## 2017-04-22 NOTE — Patient Instructions (Signed)
Order- DME [facility]- please increase CPAP to 10, continue mask of choice, humidifier, supplies, AirView     Dx OSA  Please call as needed

## 2017-04-22 NOTE — Assessment & Plan Note (Addendum)
She describes excellent compliance and control with CPAP, aided by some weight loss. She is happy to continue and clearly thinks she benefits from this therapy. She would like to increase pressure a little for trial. Plan-[facility] increase CPAP to 10 and provide download

## 2017-04-22 NOTE — Progress Notes (Signed)
Patient ID: [name], female    DOB: 02-26-1941, 76 y.o.   MRN: [id number] HPI  female never smoker followed for OSA, insomnia, restless legs, complicated by PAT, stage III kidney disease, HBP. NPSG 09/18/05- AHI 19.8/ hr  -------------------------------------------------------------------------------------------------------- 10/06/15- 76 year old female never smoker followed for obstructive sleep apnea, insomnia, restless legs, complicated by PAT, stage III kidney disease, HBP CPAP 9/[facility] FOLLOWS FOR:Pt resides at [facility] 1-2 weeks ago); DME is [facility] for now-patient is interested in nasal pillows mask. Wears CPAP most everry night Has moved to assisted living after repeated falls and so far is very happy there. Continues Requip for restless legs and clonazepam for insomnia. Continues CPAP. She would like to change to nasal pillows mask. Describes compliance is good.  09/30/2016-76 year old female never smoker followed for OSA, insomnia, restless legs, complicated by PAT, stage III kidney disease, HBP CPAP 9/[facility]- no longer using at long- term adult care home in [place] NPSG 09/18/05- AHI 19.8/ hr FOLLOWS FOR: Pt no longer using CPAP since being in assisted living-pt needs assistance in getting her CPAP back ([facility] is used as DME at [facility]). Pt at ED over the weekend at [facility] for GI  illness.  Transport aide from her senior living center is here today. Lost her CPAP machine somewhere moving to her current home in [place] 3 months ago. She is not breathing or sleeping as well without CPAP. Continues trazodone and clonazepam for sleep. Insomnia pattern adequately controlled with this.  04/22/17- 76 year old female never smoker followed for OSA, insomnia, restless legs, complicated by PAT, stage III kidney disease, HBP CPAP 9/[facility]->> 10 today FOLLOW UP FOR  OSA DME  is  [facility]  patient wears CPAP every night patient sleep about 6 or 8 hours a  night with her CPAP machine on  No download available at this visit. She says sleep quality is much improved now. Insomnia used to be a major issue. Limb movement sleep disturbance controlled by clonazepam. She is also on antidepressants. She describes excellent compliance and control by CPAP with no snore through. Mask fits well. Machine is working well.   Review of Systems-see HPI Constitutional:   No-   weight loss, night sweats, fevers, chills, fatigue, lassitude. HEENT:   + headaches, No-difficulty swallowing, tooth/dental problems, sore throat,       No-  sneezing, itching, ear ache, nasal congestion, post nasal drip,  CV:  No-   chest pain, orthopnea, PND, swelling in lower extremities, anasarca, Dizziness,+ palpitations Resp: No-   shortness of breath with exertion or at rest.              No-   productive cough,  No non-productive cough,  No-  coughing up of blood.              No-   change in color of mucus.  No- wheezing.   Skin: No-   rash or lesions. GI:  No-   heartburn, indigestion, abdominal pain, nausea, vomiting,  GU:  MS:  +  joint pain or swelling.  Neuro- as per HPI  Psych:  No- change in mood or affect. + depression and anxiety.  No memory loss.  Objective:   Physical Exam General- Alert, Oriented, Affect-appropriate, oriented and calm,  Distress- none acute. +Looks thinner Skin- rash-none, lesions- none, excoriation- none Lymphadenopathy- none Head- atraumatic            Eyes- Gross vision intact, PERRLA, conjunctivae clear secretions  Ears- Hearing, canals-normal            Nose- Clear, no-Septal dev, mucus, polyps, erosion, perforation             Throat- Mallampati IV , mucosa clear , drainage- none, tonsils- atrophic, dentures Neck- flexible , trachea midline, no stridor , thyroid nl, carotid no bruit Chest - symmetrical excursion , unlabored           Heart/CV- RRR , no murmur , no gallop  , no rub, nl s1 s2                           - JVD- none  , edema- none, stasis changes- none, varices- none           Lung- clear to P&A, wheeze- none, cough -None , dullness-none, rub- none           Chest wall-  Abd- Br/ Gen/ Rectal- Not done, not indicated Extrem- cyanosis- none, clubbing, none, atrophy- none, strength- nl. +Cane Neuro- grossly intact to observation

## 2017-04-25 DIAGNOSIS — I132 Hypertensive heart and chronic kidney disease with heart failure and with stage 5 chronic kidney disease, or end stage renal disease: Secondary | ICD-10-CM | POA: Diagnosis not present

## 2017-04-25 DIAGNOSIS — R42 Dizziness and giddiness: Secondary | ICD-10-CM | POA: Diagnosis not present

## 2017-04-25 DIAGNOSIS — E038 Other specified hypothyroidism: Secondary | ICD-10-CM | POA: Diagnosis not present

## 2017-04-25 DIAGNOSIS — R262 Difficulty in walking, not elsewhere classified: Secondary | ICD-10-CM | POA: Diagnosis not present

## 2017-05-02 ENCOUNTER — Telehealth: Payer: Self-pay | Admitting: Internal Medicine

## 2017-05-02 DIAGNOSIS — G4733 Obstructive sleep apnea (adult) (pediatric): Secondary | ICD-10-CM | POA: Diagnosis not present

## 2017-05-02 NOTE — Telephone Encounter (Signed)
Patient was instructed in Feb to call back after a few weeks off of Aricept.  She states that the diarrhea completely resolved.  She is asking if she can resume?

## 2017-05-02 NOTE — Telephone Encounter (Signed)
Patient notified

## 2017-05-02 NOTE — Telephone Encounter (Signed)
She should ask whomever prescribed that if there is an alternative - if she restarts she will get diarrhea again

## 2017-05-04 DIAGNOSIS — S199XXA Unspecified injury of neck, initial encounter: Secondary | ICD-10-CM | POA: Diagnosis not present

## 2017-05-04 DIAGNOSIS — M542 Cervicalgia: Secondary | ICD-10-CM | POA: Diagnosis not present

## 2017-05-04 DIAGNOSIS — S0101XA Laceration without foreign body of scalp, initial encounter: Secondary | ICD-10-CM | POA: Diagnosis not present

## 2017-05-04 DIAGNOSIS — S0990XA Unspecified injury of head, initial encounter: Secondary | ICD-10-CM | POA: Diagnosis not present

## 2017-05-04 DIAGNOSIS — S0191XA Laceration without foreign body of unspecified part of head, initial encounter: Secondary | ICD-10-CM | POA: Diagnosis not present

## 2017-05-13 DIAGNOSIS — G4733 Obstructive sleep apnea (adult) (pediatric): Secondary | ICD-10-CM | POA: Diagnosis not present

## 2017-05-25 ENCOUNTER — Emergency Department (HOSPITAL_COMMUNITY): Payer: Medicare Other

## 2017-05-25 ENCOUNTER — Observation Stay (HOSPITAL_COMMUNITY)
Admission: EM | Admit: 2017-05-25 | Discharge: 2017-05-28 | Disposition: A | Discharge status: Skilled Nursing Facility | Payer: Medicare Other | Attending: Family Medicine | Admitting: Family Medicine

## 2017-05-25 DIAGNOSIS — R2681 Unsteadiness on feet: Secondary | ICD-10-CM | POA: Insufficient documentation

## 2017-05-25 DIAGNOSIS — S7291XA Unspecified fracture of right femur, initial encounter for closed fracture: Secondary | ICD-10-CM

## 2017-05-25 DIAGNOSIS — G4733 Obstructive sleep apnea (adult) (pediatric): Secondary | ICD-10-CM | POA: Diagnosis present

## 2017-05-25 DIAGNOSIS — Z885 Allergy status to narcotic agent status: Secondary | ICD-10-CM | POA: Insufficient documentation

## 2017-05-25 DIAGNOSIS — I471 Supraventricular tachycardia: Secondary | ICD-10-CM | POA: Diagnosis not present

## 2017-05-25 DIAGNOSIS — I129 Hypertensive chronic kidney disease with stage 1 through stage 4 chronic kidney disease, or unspecified chronic kidney disease: Secondary | ICD-10-CM | POA: Diagnosis not present

## 2017-05-25 DIAGNOSIS — N183 Chronic kidney disease, stage 3 unspecified: Secondary | ICD-10-CM | POA: Diagnosis present

## 2017-05-25 DIAGNOSIS — E039 Hypothyroidism, unspecified: Secondary | ICD-10-CM | POA: Diagnosis not present

## 2017-05-25 DIAGNOSIS — I4719 Other supraventricular tachycardia: Secondary | ICD-10-CM | POA: Diagnosis present

## 2017-05-25 DIAGNOSIS — E876 Hypokalemia: Secondary | ICD-10-CM

## 2017-05-25 DIAGNOSIS — I480 Paroxysmal atrial fibrillation: Secondary | ICD-10-CM | POA: Diagnosis not present

## 2017-05-25 DIAGNOSIS — Z7902 Long term (current) use of antithrombotics/antiplatelets: Secondary | ICD-10-CM | POA: Diagnosis not present

## 2017-05-25 DIAGNOSIS — K5900 Constipation, unspecified: Secondary | ICD-10-CM | POA: Insufficient documentation

## 2017-05-25 DIAGNOSIS — F0390 Unspecified dementia without behavioral disturbance: Secondary | ICD-10-CM | POA: Insufficient documentation

## 2017-05-25 DIAGNOSIS — K219 Gastro-esophageal reflux disease without esophagitis: Secondary | ICD-10-CM | POA: Diagnosis not present

## 2017-05-25 DIAGNOSIS — R109 Unspecified abdominal pain: Secondary | ICD-10-CM

## 2017-05-25 DIAGNOSIS — S72111A Displaced fracture of greater trochanter of right femur, initial encounter for closed fracture: Secondary | ICD-10-CM | POA: Diagnosis not present

## 2017-05-25 DIAGNOSIS — R262 Difficulty in walking, not elsewhere classified: Secondary | ICD-10-CM | POA: Insufficient documentation

## 2017-05-25 DIAGNOSIS — W010XXA Fall on same level from slipping, tripping and stumbling without subsequent striking against object, initial encounter: Secondary | ICD-10-CM | POA: Insufficient documentation

## 2017-05-25 DIAGNOSIS — F329 Major depressive disorder, single episode, unspecified: Secondary | ICD-10-CM | POA: Diagnosis not present

## 2017-05-25 DIAGNOSIS — Z96652 Presence of left artificial knee joint: Secondary | ICD-10-CM | POA: Diagnosis not present

## 2017-05-25 DIAGNOSIS — I10 Essential (primary) hypertension: Secondary | ICD-10-CM | POA: Diagnosis present

## 2017-05-25 DIAGNOSIS — S92153A Displaced avulsion fracture (chip fracture) of unspecified talus, initial encounter for closed fracture: Secondary | ICD-10-CM | POA: Diagnosis not present

## 2017-05-25 DIAGNOSIS — M25551 Pain in right hip: Secondary | ICD-10-CM | POA: Diagnosis not present

## 2017-05-25 DIAGNOSIS — S72112A Displaced fracture of greater trochanter of left femur, initial encounter for closed fracture: Secondary | ICD-10-CM | POA: Diagnosis not present

## 2017-05-25 DIAGNOSIS — F419 Anxiety disorder, unspecified: Secondary | ICD-10-CM | POA: Diagnosis not present

## 2017-05-25 DIAGNOSIS — M25561 Pain in right knee: Secondary | ICD-10-CM

## 2017-05-25 DIAGNOSIS — S79911A Unspecified injury of right hip, initial encounter: Secondary | ICD-10-CM | POA: Diagnosis not present

## 2017-05-25 DIAGNOSIS — S72114A Nondisplaced fracture of greater trochanter of right femur, initial encounter for closed fracture: Secondary | ICD-10-CM

## 2017-05-25 MED ORDER — MORPHINE SULFATE (PF) 4 MG/ML IV SOLN
2.0000 mg | Freq: Once | INTRAVENOUS | Status: AC
  Administered 2017-05-26: 2 mg via INTRAVENOUS
  Filled 2017-05-25: qty 1

## 2017-05-25 NOTE — ED Provider Notes (Signed)
[facility]-EMERGENCY DEPT Provider Note   CSN: [id number] Arrival date & time: 05/25/17  2144     History   Chief Complaint Chief Complaint  Patient presents with  . Fall  . Hip Pain    HPI [name] is a 76 y.o. female.  Patient from [facility] presents with right hip pain after fall. She was walking and turned to the right and went down on the hip. No head injury, LOC, chest pain, SOB. She denies abdominal pain or other extremity injury. No neck pain. She states she falls frequently.    The history is provided by the patient and the EMS personnel. No language interpreter was used.    Past Medical History:  Diagnosis Date  . Abnormal heart rhythm   . Abnormality of gait 12/21/2015  . Afib (HCC)    h/o  . Anxiety   . Arthritis    "hips, shoulders" (01/05/2015)  . Chronic kidney disease (CKD), stage III (moderate)    [name] 01/05/2015  . Dementia   . Depression   . Echocardiogram abnormal 02/26/11   MVP,mild MR,AOV mildly sclerotic. EF >55%  . GERD (gastroesophageal reflux disease)   . Hypertension   . Hypothyroidism   . Memory difficulty 06/05/2016  . Nausea & vomiting 11/2016  . Osteoporosis   . Reactive airway disease   . Scoliosis   . Sleep apnea    "suppose to wear a mask; can't tolerate it" (01/05/2015)    Patient Active Problem List   Diagnosis Date Noted  . Severely underweight adult 01/02/2017  . Diarrhea   . Colitis   . Dehydration 11/18/2016  . AKI (acute kidney injury) (HCC) 11/18/2016  . Abdominal pain   . Intractable vomiting with nausea   . Renal insufficiency   . Insomnia 09/30/2016  . Memory difficulty 06/05/2016  . Abnormality of gait 12/21/2015  . Cytopenia 02/09/2015  . Protein-calorie malnutrition, severe (HCC) 01/07/2015  . Vomiting and diarrhea 01/05/2015  . Sleep walking 10/08/2014  . Restless legs 10/08/2014  . Essential hypertension 09/16/2014  . Chronic kidney disease, stage 3 09/16/2014  . Paroxysmal  atrial tachycardia (HCC) 09/16/2014  . Hypothyroidism 09/16/2014  . Obstructive sleep apnea 07/18/2011    Past Surgical History:  Procedure Laterality Date  . CATARACT EXTRACTION, BILATERAL Bilateral   . CESAREAN SECTION  1967  . CESAREAN SECTION  1971  . DILATION AND CURETTAGE OF UTERUS    . FLEXIBLE SIGMOIDOSCOPY N/A 11/24/2016   Procedure: FLEXIBLE SIGMOIDOSCOPY;  Surgeon: Iva Boop, MD;  Location: [facility] ENDOSCOPY;  Service: Endoscopy;  Laterality: N/A;  . JOINT REPLACEMENT    . OPEN REDUCTION INTERNAL FIXATION (ORIF) DISTAL RADIAL FRACTURE Left 12/2009   [name] 12/28/2009  . TOTAL KNEE ARTHROPLASTY Left 03/2010   [name] 04/07/2010  . TUBAL LIGATION    . VAGINAL HYSTERECTOMY  1980    OB History    No data available       Home Medications    Prior to Admission medications   Medication Sig Start Date End Date Taking? Authorizing Provider  acetaminophen (TYLENOL) 500 MG tablet Take 1,000 mg by mouth every 6 (six) hours as needed for headache (minor discomfort (contact MD if headache or pain persists for more than 24 hours).   Yes [provider]  baclofen (LIORESAL) 10 MG tablet Take 5 mg by mouth at bedtime as needed (back spasms).    Yes [provider]  budesonide-formoterol (SYMBICORT) 160-4.5 MCG/ACT inhaler Inhale 2 puffs into  the lungs 2 (two) times daily. Rinse mouth after use - discard 3 months after opening   Yes [provider]  calcitRIOL (ROCALTROL) 0.25 MCG capsule Take 0.25 mcg by mouth every Monday, Wednesday, and Friday. Do not crush   Yes [provider]  clonazePAM (KLONOPIN) 2 MG tablet Take 1 tablet (2 mg total) by mouth at bedtime. 09/30/16  Yes Young, Joni Fears D, MD  clopidogrel (PLAVIX) 75 MG tablet Take 1 tablet (75 mg total) by mouth daily. 06/05/16  Yes York Spaniel, MD  diltiazem (CARDIZEM CD) 120 MG 24 hr capsule Take 120 mg by mouth daily. 05/21/17  Yes [provider]  diltiazem (CARDIZEM) 30 MG tablet  Take 30 mg by mouth at bedtime as needed (palpitations or tachycardia). Do not crush   Yes [provider]  diphenhydrAMINE (BENADRYL) 25 MG tablet Take 25 mg by mouth every 4 (four) hours as needed for itching (minor allergic reaction).   Yes [provider]  diphenoxylate-atropine (LOMOTIL) 2.5-0.025 MG tablet Take 1 tablet by mouth 3 (three) times daily as needed for diarrhea or loose stools. 11/26/16  Yes Wendee Beavers, DO  feeding supplement, ENSURE ENLIVE, (ENSURE ENLIVE) LIQD Take 237 mLs by mouth 3 (three) times daily between meals. 11/26/16  Yes Wendee Beavers, DO  levothyroxine (SYNTHROID, LEVOTHROID) 112 MCG tablet Take 112 mcg by mouth daily.    Yes [provider]  metoprolol tartrate (LOPRESSOR) 25 MG tablet Take 12.5 mg by mouth 2 (two) times daily.    Yes [provider]  Omega-3 Fatty Acids (FISH OIL) 1000 MG CAPS Take 1,000 mg by mouth daily. Do not crush   Yes [provider]  ondansetron (ZOFRAN) 4 MG tablet Take 4 mg by mouth every 8 (eight) hours as needed for nausea or vomiting.   Yes [provider]  oxyCODONE (OXY IR/ROXICODONE) 5 MG immediate release tablet Take 5 mg by mouth every 4 (four) hours as needed for breakthrough pain. 03/12/17  Yes [provider]  polyethylene glycol (MIRALAX / GLYCOLAX) packet Take 17 g by mouth daily as needed (constipation). Mix in 8 oz of water and drink   Yes [provider]  sertraline (ZOLOFT) 100 MG tablet Take 150 mg by mouth daily.   Yes [provider]  traMADol (ULTRAM) 50 MG tablet Take 50 mg by mouth See admin instructions. Take 1 tablet (50 mg) by mouth 3 times daily, may also take 1 tablet every 8 hours as needed for pain   Yes [provider]  traZODone (DESYREL) 100 MG tablet Take 1 tablet (100 mg total) by mouth at bedtime. 09/30/16  Yes Young, Joni Fears D, MD  memantine [facility] TITRATION PAK) tablet pack 5 mg/day for =1 week; 5 mg twice  daily for =1 week; 15 mg/day given in 5 mg and 10 mg separated doses for =1 week; then 10 mg twice daily Patient not taking: Reported on 04/22/2017 01/20/17   York Spaniel, MD    Family History Family History  Problem Relation Age of Onset  . Cancer Mother   . Heart disease Father   . Cancer Father     Social History Social History  Substance Use Topics  . Smoking status: Never Smoker  . Smokeless tobacco: Never Used  . Alcohol use No     Allergies   Vicodin [hydrocodone-acetaminophen]   Review of Systems Review of Systems  Constitutional: Negative for chills and fever.  Respiratory: Negative.  Negative for shortness  of breath.   Cardiovascular: Negative.  Negative for chest pain.  Gastrointestinal: Negative.  Negative for abdominal pain.  Musculoskeletal: Negative for back pain and neck pain.       See HPI.  Skin: Negative.  Negative for wound.  Neurological: Negative.  Negative for syncope, numbness and headaches.     Physical Exam Updated Vital Signs BP (!) 142/69 (BP Location: Left Arm)   Pulse 65   Temp 98.1 F (36.7 C)   Resp 18   Ht 5\' 3"  (1.6 m)   Wt 56.7 kg (125 lb)   SpO2 97%   BMI 22.14 kg/m   Physical Exam  Constitutional: She is oriented to person, place, and time. She appears well-developed and well-nourished. No distress.  Awake, alert, in NAD.  HENT:  Head: Normocephalic and atraumatic.  Neck: Normal range of motion. Neck supple.  Cardiovascular: Normal rate, regular rhythm and intact distal pulses.   Pulmonary/Chest: Effort normal and breath sounds normal. No respiratory distress. She exhibits no tenderness.  Abdominal: Soft. Bowel sounds are normal. There is no tenderness. There is no rebound and no guarding.  Musculoskeletal: Normal range of motion. She exhibits no edema.  No bony deformity of right LE. No definite shortening, no external rotation. Significant tenderness in right LE limited to lateral right hip.   No midline cervical  tenderness. No pain with movement or tenderness of other extremities.   Neurological: She is alert and oriented to person, place, and time.  Skin: Skin is warm and dry. No rash noted.  Psychiatric: She has a normal mood and affect.     ED Treatments / Results  Labs (all labs ordered are listed, but only abnormal results are displayed) Labs Reviewed - No data to display  EKG  EKG Interpretation None       Radiology No results found.  Procedures Procedures (including critical care time)  Medications Ordered in ED Medications  morphine 4 MG/ML injection 2 mg (not administered)     Initial Impression / Assessment and Plan / ED Course  I have reviewed the triage vital signs and the nursing notes.  Pertinent labs & imaging results that were available during my care of the patient were reviewed by me and considered in my medical decision making (see chart for details).     Patient presents after mechanical fall with right hip pain. No other complaints. She is alert, oriented and considered a reliable historian.   Imaging of the right hip is negative. She cannot move right LE without severe pain. CT hip ordered and shows a non-displaced greater trochanteric fracture.   The patient has been resting comfortably without complaint. There was urinary incontinence. Potassium low at 2.7. Supplementation ordered.   [facility]  Final Clinical Impressions(s) / ED Diagnoses   Final diagnoses:  None   1. Right greater troch fracture 2. Hypokalemia  New Prescriptions New Prescriptions   No medications on file     Elpidio AnisUpstill, Pearlena Ow, Cordelia Poche-C 05/25/17 2232    Elpidio AnisUpstill, Mecca Barga, PA-C 05/26/17 [facility] [id number]    Rolland PorterJames, Mark, MD 06/04/17 [phone]

## 2017-05-25 NOTE — ED Notes (Signed)
Patient transported to X-ray 

## 2017-05-25 NOTE — ED Provider Notes (Signed)
Pt seen and evaluated. D/W Elpidio AnisShari Upstill. Pt fell after standing up quickly in a closet. Fell onto right hip. Had to be transferred on a sheet from floor, to EMS. Initial XR here no fracutre. Pt tender directly on Greater Trochanter, does not feel as though she could bear weight. Recommend Ct of hip. Pain meds. Re-evaluation   Rolland PorterJames, Chibuike Fleek, MD 05/25/17 [phone]

## 2017-05-25 NOTE — ED Triage Notes (Signed)
Pt BIB EMS from [facility] for mechanical fall. Per EMS pt was going into closet to get box of magazines when she fell and landed on rt hip. Pt denies CP, dizziness, or LOC; denies hitting head or neck pain. Pt A&Ox4; resp e/u; nad noted at this time.

## 2017-05-26 ENCOUNTER — Encounter (HOSPITAL_COMMUNITY): Payer: Self-pay | Admitting: Internal Medicine

## 2017-05-26 ENCOUNTER — Inpatient Hospital Stay (HOSPITAL_COMMUNITY): Payer: Medicare Other

## 2017-05-26 ENCOUNTER — Emergency Department (HOSPITAL_COMMUNITY): Payer: Medicare Other

## 2017-05-26 DIAGNOSIS — S72114A Nondisplaced fracture of greater trochanter of right femur, initial encounter for closed fracture: Secondary | ICD-10-CM

## 2017-05-26 DIAGNOSIS — S72112A Displaced fracture of greater trochanter of left femur, initial encounter for closed fracture: Secondary | ICD-10-CM | POA: Diagnosis not present

## 2017-05-26 DIAGNOSIS — S7291XA Unspecified fracture of right femur, initial encounter for closed fracture: Secondary | ICD-10-CM

## 2017-05-26 DIAGNOSIS — E876 Hypokalemia: Secondary | ICD-10-CM | POA: Diagnosis not present

## 2017-05-26 DIAGNOSIS — I10 Essential (primary) hypertension: Secondary | ICD-10-CM

## 2017-05-26 DIAGNOSIS — M25561 Pain in right knee: Secondary | ICD-10-CM | POA: Diagnosis not present

## 2017-05-26 LAB — URINALYSIS, ROUTINE W REFLEX MICROSCOPIC
BACTERIA UA: NONE SEEN
BILIRUBIN URINE: NEGATIVE
Glucose, UA: NEGATIVE mg/dL
KETONES UR: NEGATIVE mg/dL
LEUKOCYTES UA: NEGATIVE
NITRITE: NEGATIVE
PROTEIN: NEGATIVE mg/dL
Specific Gravity, Urine: 1.013 (ref 1.005–1.030)
pH: 6 (ref 5.0–8.0)

## 2017-05-26 LAB — COMPREHENSIVE METABOLIC PANEL
ALBUMIN: 3.7 g/dL (ref 3.5–5.0)
ALT: 26 U/L (ref 14–54)
AST: 24 U/L (ref 15–41)
Alkaline Phosphatase: 60 U/L (ref 38–126)
Anion gap: 10 (ref 5–15)
BUN: 19 mg/dL (ref 6–20)
CHLORIDE: 102 mmol/L (ref 101–111)
CO2: 30 mmol/L (ref 22–32)
Calcium: 9.1 mg/dL (ref 8.9–10.3)
Creatinine, Ser: 1.47 mg/dL — ABNORMAL HIGH (ref 0.44–1.00)
GFR calc Af Amer: 40 mL/min — ABNORMAL LOW (ref >60)
GFR calc non Af Amer: 34 mL/min — ABNORMAL LOW (ref >60)
GLUCOSE: 83 mg/dL (ref 65–99)
POTASSIUM: 2.7 mmol/L — AB (ref 3.5–5.1)
SODIUM: 142 mmol/L (ref 135–145)
Total Bilirubin: 0.8 mg/dL (ref 0.3–1.2)
Total Protein: 7.1 g/dL (ref 6.5–8.1)

## 2017-05-26 LAB — POTASSIUM: Potassium: 4.2 mmol/L (ref 3.5–5.1)

## 2017-05-26 LAB — MAGNESIUM: MAGNESIUM: 1.8 mg/dL (ref 1.7–2.4)

## 2017-05-26 LAB — CBC WITH DIFFERENTIAL/PLATELET
BASOS PCT: 0 %
Basophils Absolute: 0 10*3/uL (ref 0.0–0.1)
EOS ABS: 0 10*3/uL (ref 0.0–0.7)
EOS PCT: 0 %
HCT: 37.2 % (ref 36.0–46.0)
HEMOGLOBIN: 12 g/dL (ref 12.0–15.0)
Lymphocytes Relative: 13 %
Lymphs Abs: 1.2 10*3/uL (ref 0.7–4.0)
MCH: 30.8 pg (ref 26.0–34.0)
MCHC: 32.3 g/dL (ref 30.0–36.0)
MCV: 95.6 fL (ref 78.0–100.0)
MONO ABS: 0.4 10*3/uL (ref 0.1–1.0)
MONOS PCT: 5 %
NEUTROS PCT: 82 %
Neutro Abs: 7.9 10*3/uL — ABNORMAL HIGH (ref 1.7–7.7)
PLATELETS: 112 10*3/uL — AB (ref 150–400)
RBC: 3.89 MIL/uL (ref 3.87–5.11)
RDW: 13.2 % (ref 11.5–15.5)
WBC: 9.6 10*3/uL (ref 4.0–10.5)

## 2017-05-26 LAB — PROTIME-INR
INR: 1.06
Prothrombin Time: 13.9 seconds (ref 11.4–15.2)

## 2017-05-26 LAB — APTT: APTT: 30 s (ref 24–36)

## 2017-05-26 MED ORDER — CLONAZEPAM 1 MG PO TABS
2.0000 mg | ORAL_TABLET | Freq: Every day | ORAL | Status: DC
  Administered 2017-05-26 – 2017-05-27 (×2): 2 mg via ORAL
  Filled 2017-05-26 (×2): qty 2

## 2017-05-26 MED ORDER — POTASSIUM CHLORIDE 10 MEQ/100ML IV SOLN: 10.0000 meq | INTRAVENOUS | Status: DC

## 2017-05-26 MED ORDER — SODIUM CHLORIDE 0.9 % IV SOLN
INTRAVENOUS | Status: AC
  Administered 2017-05-26: 06:00:00 via INTRAVENOUS

## 2017-05-26 MED ORDER — POTASSIUM CHLORIDE 10 MEQ/100ML IV SOLN
10.0000 meq | Freq: Once | INTRAVENOUS | Status: AC
  Administered 2017-05-26: 10 meq via INTRAVENOUS
  Filled 2017-05-26: qty 100

## 2017-05-26 MED ORDER — MOMETASONE FURO-FORMOTEROL FUM 200-5 MCG/ACT IN AERO
2.0000 | INHALATION_SPRAY | Freq: Two times a day (BID) | RESPIRATORY_TRACT | Status: DC
  Administered 2017-05-26 – 2017-05-28 (×5): 2 via RESPIRATORY_TRACT
  Filled 2017-05-26: qty 8.8

## 2017-05-26 MED ORDER — DILTIAZEM HCL ER COATED BEADS 120 MG PO CP24
120.0000 mg | ORAL_CAPSULE | Freq: Every day | ORAL | Status: DC
  Administered 2017-05-26 – 2017-05-28 (×3): 120 mg via ORAL
  Filled 2017-05-26 (×3): qty 1

## 2017-05-26 MED ORDER — MORPHINE SULFATE (PF) 4 MG/ML IV SOLN: 2.0000 mg | Freq: Once | INTRAVENOUS | Status: DC

## 2017-05-26 MED ORDER — OXYCODONE HCL 5 MG PO TABS
5.0000 mg | ORAL_TABLET | ORAL | Status: DC | PRN
  Administered 2017-05-26 – 2017-05-28 (×6): 5 mg via ORAL
  Filled 2017-05-26 (×6): qty 1

## 2017-05-26 MED ORDER — DIPHENOXYLATE-ATROPINE 2.5-0.025 MG PO TABS: 1.0000 | ORAL_TABLET | Freq: Three times a day (TID) | ORAL | Status: DC | PRN

## 2017-05-26 MED ORDER — SERTRALINE HCL 50 MG PO TABS
150.0000 mg | ORAL_TABLET | Freq: Every day | ORAL | Status: DC
  Administered 2017-05-26 – 2017-05-28 (×3): 150 mg via ORAL
  Filled 2017-05-26 (×2): qty 1
  Filled 2017-05-26: qty 2

## 2017-05-26 MED ORDER — MORPHINE SULFATE (PF) 4 MG/ML IV SOLN
1.0000 mg | INTRAVENOUS | Status: DC | PRN
  Administered 2017-05-26 – 2017-05-27 (×2): 1 mg via INTRAVENOUS
  Filled 2017-05-26 (×2): qty 1

## 2017-05-26 MED ORDER — HEPARIN SODIUM (PORCINE) 5000 UNIT/ML IJ SOLN
5000.0000 [IU] | Freq: Three times a day (TID) | INTRAMUSCULAR | Status: DC
  Administered 2017-05-27 – 2017-05-28 (×4): 5000 [IU] via SUBCUTANEOUS
  Filled 2017-05-26 (×5): qty 1

## 2017-05-26 MED ORDER — BACLOFEN 10 MG PO TABS
5.0000 mg | ORAL_TABLET | Freq: Every evening | ORAL | Status: DC | PRN
  Administered 2017-05-27: 5 mg via ORAL
  Filled 2017-05-26: qty 1

## 2017-05-26 MED ORDER — CALCITRIOL 0.25 MCG PO CAPS
0.2500 ug | ORAL_CAPSULE | ORAL | Status: DC
  Administered 2017-05-26 – 2017-05-28 (×2): 0.25 ug via ORAL
  Filled 2017-05-26 (×2): qty 1

## 2017-05-26 MED ORDER — POTASSIUM CHLORIDE 10 MEQ/100ML IV SOLN
10.0000 meq | INTRAVENOUS | Status: AC
  Administered 2017-05-26: 10 meq via INTRAVENOUS
  Filled 2017-05-26: qty 100

## 2017-05-26 MED ORDER — POTASSIUM CHLORIDE CRYS ER 20 MEQ PO TBCR
40.0000 meq | EXTENDED_RELEASE_TABLET | Freq: Once | ORAL | Status: AC
  Administered 2017-05-26: 40 meq via ORAL
  Filled 2017-05-26: qty 2

## 2017-05-26 MED ORDER — OMEGA-3-ACID ETHYL ESTERS 1 G PO CAPS
1.0000 g | ORAL_CAPSULE | Freq: Every day | ORAL | Status: DC
  Administered 2017-05-26 – 2017-05-28 (×3): 1 g via ORAL
  Filled 2017-05-26 (×3): qty 1

## 2017-05-26 MED ORDER — POLYETHYLENE GLYCOL 3350 17 G PO PACK
17.0000 g | PACK | Freq: Every day | ORAL | Status: DC | PRN
  Administered 2017-05-28: 17 g via ORAL
  Filled 2017-05-26: qty 1

## 2017-05-26 MED ORDER — LEVOTHYROXINE SODIUM 112 MCG PO TABS
112.0000 ug | ORAL_TABLET | Freq: Every day | ORAL | Status: DC
Start: 2017-05-26 — End: 2017-05-28
  Administered 2017-05-27 – 2017-05-28 (×2): 112 ug via ORAL
  Filled 2017-05-26 (×2): qty 1

## 2017-05-26 MED ORDER — MORPHINE SULFATE (PF) 4 MG/ML IV SOLN
2.0000 mg | Freq: Once | INTRAVENOUS | Status: AC
  Administered 2017-05-26: 2 mg via INTRAVENOUS

## 2017-05-26 MED ORDER — POTASSIUM CHLORIDE CRYS ER 20 MEQ PO TBCR: 40.0000 meq | EXTENDED_RELEASE_TABLET | Freq: Once | ORAL | Status: DC

## 2017-05-26 MED ORDER — METOPROLOL TARTRATE 12.5 MG HALF TABLET
12.5000 mg | ORAL_TABLET | Freq: Two times a day (BID) | ORAL | Status: DC
  Administered 2017-05-26 – 2017-05-28 (×5): 12.5 mg via ORAL
  Filled 2017-05-26 (×5): qty 1

## 2017-05-26 MED ORDER — ONDANSETRON HCL 4 MG PO TABS: 4.0000 mg | ORAL_TABLET | Freq: Three times a day (TID) | ORAL | Status: DC | PRN

## 2017-05-26 MED ORDER — DILTIAZEM HCL 30 MG PO TABS: 30.0000 mg | ORAL_TABLET | Freq: Every evening | ORAL | Status: DC | PRN

## 2017-05-26 MED ORDER — DIPHENHYDRAMINE HCL 25 MG PO CAPS: 25.0000 mg | ORAL_CAPSULE | ORAL | Status: DC | PRN

## 2017-05-26 MED ORDER — TRAMADOL HCL 50 MG PO TABS: 50.0000 mg | ORAL_TABLET | ORAL | Status: DC

## 2017-05-26 MED ORDER — SODIUM CHLORIDE 0.9% FLUSH
3.0000 mL | Freq: Two times a day (BID) | INTRAVENOUS | Status: DC
  Administered 2017-05-26 – 2017-05-27 (×2): 3 mL via INTRAVENOUS

## 2017-05-26 MED ORDER — ACETAMINOPHEN 500 MG PO TABS
1000.0000 mg | ORAL_TABLET | Freq: Four times a day (QID) | ORAL | Status: DC | PRN
  Administered 2017-05-26: 1000 mg via ORAL
  Filled 2017-05-26: qty 2

## 2017-05-26 MED ORDER — CLOPIDOGREL BISULFATE 75 MG PO TABS
75.0000 mg | ORAL_TABLET | Freq: Every day | ORAL | Status: DC
  Administered 2017-05-26 – 2017-05-28 (×3): 75 mg via ORAL
  Filled 2017-05-26 (×3): qty 1

## 2017-05-26 MED ORDER — TRAZODONE HCL 100 MG PO TABS
100.0000 mg | ORAL_TABLET | Freq: Every day | ORAL | Status: DC
  Administered 2017-05-26 – 2017-05-27 (×2): 100 mg via ORAL
  Filled 2017-05-26 (×2): qty 1

## 2017-05-26 MED ORDER — HYDRALAZINE HCL 20 MG/ML IJ SOLN: 10.0000 mg | Freq: Four times a day (QID) | INTRAMUSCULAR | Status: DC | PRN

## 2017-05-26 MED ORDER — MORPHINE SULFATE (PF) 4 MG/ML IV SOLN
INTRAVENOUS | Status: AC
  Filled 2017-05-26: qty 1

## 2017-05-26 MED ORDER — ENSURE ENLIVE PO LIQD
237.0000 mL | Freq: Three times a day (TID) | ORAL | Status: DC
  Administered 2017-05-26 – 2017-05-28 (×7): 237 mL via ORAL

## 2017-05-26 NOTE — ED Notes (Signed)
Morphine 2mg  iv

## 2017-05-26 NOTE — Progress Notes (Signed)
Pt has refused cpap at this time.  Rt will monitor. 

## 2017-05-26 NOTE — ED Notes (Signed)
Patient transported to CT 

## 2017-05-26 NOTE — Progress Notes (Signed)
[facility]                                                                              Patient Demographics  [name], is a 76 y.o. female, DOB - 11/04/1941, ZOX:[id number]  Admit date - 05/25/2017   Admitting Physician Pearson Grippe, MD  Outpatient Primary MD for the patient is Shelbie Ammons, MD  Outpatient specialists:   LOS - 0  days   Medical records reviewed and are as summarized below:    Chief Complaint  Patient presents with  . Fall  . Hip Pain       Brief summary  The patient is a 76 year old female with a history of dementia, paroxysmal atrial tachycardia/palpitations, OSA on CPAP, chronic kidney disease, stage III apparently slipped on some magazines on the ground and fell. Patient was found to have comminuted fracture of the greater trochanter of the right femur.    Assessment & Plan    Principal Problem:   Femur fracture, right (HCC) - Secondary to mechanical fall - CT of the right hip showed comminuted fractures involving the greater trochanter of the right femur with slight separation of the fracture fragments, no dislocation - Consulted orthopedics, will follow recommendations regarding surgery -Continue pain control, heparin subcutaneous for now   Active Problems:   Obstructive sleep apnea -CPAP qhs    Essential hypertension - Currently stable, continue metoprolol    Chronic kidney disease, stage 3 -Appears to be at baseline, follow creatinine function closely     Paroxysmal atrial tachycardia (HCC) - Continue metoprolol, Cardizem - Rate controlled, continue Plavix    Hypothyroidism - Continue Synthroid, follow TSH  - TSH was 0.3 on 11/19/16    Hypokalemia - Replaced, presented with potassium of 2.7, repeat potassium 4.2 after replacement  Code Status: full DVT Prophylaxis:   heparin  Family Communication: Discussed in detail with the patient, all imaging results, lab results explained to the patient    Disposition  Plan:   Time Spent in minutes  25 minutes  Procedures:    Consultants:  Orthopedics  Antimicrobials :      Medications  Scheduled Meds: . calcitRIOL  0.25 mcg Oral Q M,W,F  . clonazePAM  2 mg Oral QHS  . clopidogrel  75 mg Oral Daily  . diltiazem  120 mg Oral Daily  . feeding supplement (ENSURE ENLIVE)  237 mL Oral TID BM  . heparin  5,000 Units Subcutaneous Q8H  . levothyroxine  112 mcg Oral QAC breakfast  . metoprolol tartrate  12.5 mg Oral BID  . mometasone-formoterol  2 puff Inhalation BID  . omega-3 acid ethyl esters  1 g Oral Daily  . sertraline  150 mg Oral Daily  . sodium chloride flush  3 mL Intravenous Q12H  . traZODone  100 mg Oral QHS   Continuous Infusions: . sodium chloride 75 mL/hr at 05/26/17 0558   PRN Meds:.acetaminophen, baclofen, diphenhydrAMINE, diphenoxylate-atropine, hydrALAZINE, morphine injection, ondansetron, oxyCODONE, polyethylene glycol   Antibiotics   Anti-infectives    None        Subjective:   [name] was seen and examined today.Complaining of pain  in the right leg on moving, 7/10, sharp and throbbing.  Patient denies dizziness, chest pain, shortness of breath, abdominal pain, N/V/D/C, new weakness, numbess, tingling.   Objective:   Vitals:   05/26/17 0500 05/26/17 0515 05/26/17 0700 05/26/17 0900  BP: (!) 152/80 (!) 156/73 (!) 160/72 (!) 159/81  Pulse: 76 78 72 72  Resp:    18  Temp:    98 F (36.7 C)  TempSrc:    Oral  SpO2: 97% 94% 98% 98%  Weight:      Height:       No intake or output data in the 24 hours ending 05/26/17 1127   Wt Readings from Last 3 Encounters:  05/25/17 56.7 kg (125 lb)  04/22/17 51.1 kg (112 lb 9.6 oz)  01/14/17 49.5 kg (109 lb 3.2 oz)     Exam  General: Alert and oriented x 3, NAD  Eyes: PERRLA, EOMI, Anicteric Sclera,  HEENT:  Atraumatic, normocephalic, normal oropharynx  Cardiovascular: S1 S2 auscultated, 2/6 SEM. Regular rate and rhythm.  Respiratory: Clear to  auscultation bilaterally, no wheezing, rales or rhonchi  Gastrointestinal: Soft, nontender, nondistended, + bowel sounds  Ext: no pedal edema bilaterally  Neuro: AAOx3, Cr N's II- XII. ROM decreased in RLE due to pain   Musculoskeletal: No digital cyanosis, clubbing  Skin: No rashes  Psych: Normal affect and demeanor, alert and oriented x3    Data Reviewed:  I have personally reviewed following labs and imaging studies  Micro Results No results found for this or any previous visit (from the past 240 hour(s)).  Radiology Reports Ct Hip Right Wo Contrast  Result Date: 05/26/2017 CLINICAL DATA:  Fall with right hip pain EXAM: CT OF THE RIGHT HIP WITHOUT CONTRAST TECHNIQUE: Multidetector CT imaging of the right hip was performed according to the standard protocol. Multiplanar CT image reconstructions were also generated. COMPARISON:  Radiograph 05/25/2017, CT 11/18/2016 FINDINGS: Bones/Joint/Cartilage Comminuted fracture involving the greater trochanter of the right femur with mild separation of the fracture fragments. No definite extension of fracture lucency to the lesser trochanter. No femoral head dislocation. Pubic symphysis and rami are intact. No acetabular fracture is seen. The SI joint on the right is within normal limits. Ligaments Suboptimally assessed by CT. Muscles and Tendons Normal muscle bulk about the right hip. Soft tissues Edema within the soft tissues posterior and lateral to the right hip. IMPRESSION: Comminuted fracture involving the greater trochanter of the right femur with slight separation of fracture fragments. No dislocation. Electronically Signed   By: Jasmine PangKim  Fujinaga M.D.   On: 05/26/2017 02:47   Dg Knee Complete 4 Views Right  Result Date: 05/26/2017 CLINICAL DATA:  Diffuse right knee pain. Fall 2 days ago. Initial encounter. EXAM: RIGHT KNEE - COMPLETE 4+ VIEW COMPARISON:  None. FINDINGS: No evidence of fracture, dislocation, or joint effusion. No evidence of  arthropathy or other focal bone abnormality. Soft tissues are unremarkable. Osteopenia. IMPRESSION: Negative. Electronically Signed   By: Marnee SpringJonathon  Watts M.D.   On: 05/26/2017 11:18   Dg Hip Unilat  With Pelvis 2-3 Views Right  Result Date: 05/25/2017 CLINICAL DATA:  76 year old female with fall and right hip pain. EXAM: DG HIP (WITH OR WITHOUT PELVIS) 2-3V RIGHT COMPARISON:  None. FINDINGS: No acute fracture or dislocation. The bones are osteopenic. Moderate osteoarthritic changes of the hips. The soft tissues appear unremarkable. IMPRESSION: 1. No acute fracture or dislocation. 2. Osteopenia with moderate osteoarthritis of the hips. Electronically Signed   By: Elgie CollardArash  Radparvar  M.D.   On: 05/25/2017 23:17    Lab Data:  CBC:  Recent Labs Lab 05/26/17 0324  WBC 9.6  NEUTROABS 7.9*  HGB 12.0  HCT 37.2  MCV 95.6  PLT 112*   Basic Metabolic Panel:  Recent Labs Lab 05/26/17 0324 05/26/17 0840 05/26/17 1024  NA 142  --   --   K 2.7*  --  4.2  CL 102  --   --   CO2 30  --   --   GLUCOSE 83  --   --   BUN 19  --   --   CREATININE 1.47*  --   --   CALCIUM 9.1  --   --   MG  --  1.8  --    GFR: Estimated Creatinine Clearance: 28.2 mL/min (A) (by C-G formula based on SCr of 1.47 mg/dL (H)). Liver Function Tests:  Recent Labs Lab 05/26/17 0324  AST 24  ALT 26  ALKPHOS 60  BILITOT 0.8  PROT 7.1  ALBUMIN 3.7   No results for input(s): LIPASE, AMYLASE in the last 168 hours. No results for input(s): AMMONIA in the last 168 hours. Coagulation Profile:  Recent Labs Lab 05/26/17 0840  INR 1.06   Cardiac Enzymes: No results for input(s): CKTOTAL, CKMB, CKMBINDEX, TROPONINI in the last 168 hours. BNP (last 3 results) No results for input(s): PROBNP in the last 8760 hours. HbA1C: No results for input(s): HGBA1C in the last 72 hours. CBG: No results for input(s): GLUCAP in the last 168 hours. Lipid Profile: No results for input(s): CHOL, HDL, LDLCALC, TRIG, CHOLHDL,  LDLDIRECT in the last 72 hours. Thyroid Function Tests: No results for input(s): TSH, T4TOTAL, FREET4, T3FREE, THYROIDAB in the last 72 hours. Anemia Panel: No results for input(s): VITAMINB12, FOLATE, FERRITIN, TIBC, IRON, RETICCTPCT in the last 72 hours. Urine analysis:    Component Value Date/Time   COLORURINE YELLOW 05/26/2017 0602   APPEARANCEUR CLEAR 05/26/2017 0602   LABSPEC 1.013 05/26/2017 0602   PHURINE 6.0 05/26/2017 0602   GLUCOSEU NEGATIVE 05/26/2017 0602   HGBUR SMALL (A) 05/26/2017 0602   BILIRUBINUR NEGATIVE 05/26/2017 0602   KETONESUR NEGATIVE 05/26/2017 0602   PROTEINUR NEGATIVE 05/26/2017 0602   UROBILINOGEN 1.0 01/05/2015 1430   NITRITE NEGATIVE 05/26/2017 0602   LEUKOCYTESUR NEGATIVE 05/26/2017 0602     Genean Adamski M.D. [facility] 05/26/2017, 11:27 AM  Pager: [phone] Between 7am to 7pm - call Pager - [phone]  After 7pm go to [email or web address] - password [email or web address]  Call night coverage person covering after 7pm

## 2017-05-26 NOTE — H&P (Addendum)
TRH H&P   Patient Demographics:    [name], is a 76 y.o. female  MRN: [id number]   DOB - [birth year]  Admit Date - 05/25/2017  Outpatient Primary MD for the patient is Shelbie AmmonsHaque, Imran P, MD  Referring MD/NP/PA: Jimmye NormanUpstill, Mark Ohn Bostic  Outpatient Specialists: Jetty Duhamellinton Young, Stan Headarl Gessner, Melanee SpryMiahi Croitoru  [facility]  Chief Complaint  Patient presents with  . Fall  . Hip Pain      HPI:    [name]  is a 76 y.o. female, w Dementia, Pafib, OSA on cpap CKD stage3 apparently slipped on some magazines on the ground and fell.  Pt was brought to ED for evaluation due to pain.   In ED,  Pt found to have comminuted fracture of the greater trochanter of the right femur.  Per ED, this is typically treated nonsurgically and hospitalist asked to admit patient and consult orthopedics this am.     Review of systems:    In addition to the HPI above,  No Fever-chills, No Headache, No changes with Vision or hearing, No problems swallowing food or Liquids, No Chest pain, Cough or Shortness of Breath, No Abdominal pain, No Nausea or Vommitting, Bowel movements are regular, No Blood in stool or Urine, No dysuria, No new skin rashes or bruises, No new joints pains-aches,  No new weakness, tingling, numbness in any extremity, No recent weight gain or loss, No polyuria, polydypsia or polyphagia, No significant Mental Stressors.  A full 10 point Review of Systems was done, except as stated above, all other Review of Systems were negative.   With Past History of the following :    Past Medical History:  Diagnosis Date  . Abnormal heart rhythm   . Abnormality of gait 12/21/2015  . Afib (HCC)    h/o  . Anxiety   . Arthritis    "hips, shoulders" (01/05/2015)  . Chronic kidney disease (CKD), stage III (moderate)    [name]/notes 01/05/2015  . Dementia   .  Depression   . Echocardiogram abnormal 02/26/11   MVP,mild MR,AOV mildly sclerotic. EF >55%  . GERD (gastroesophageal reflux disease)   . Hypertension   . Hypothyroidism   . Memory difficulty 06/05/2016  . Nausea & vomiting 11/2016  . Osteoporosis   . Reactive airway disease   . Scoliosis   . Sleep apnea    "suppose to wear a mask; can't tolerate it" (01/05/2015)      Past Surgical History:  Procedure Laterality Date  . CATARACT EXTRACTION, BILATERAL Bilateral   . CESAREAN SECTION  1967  . CESAREAN SECTION  1971  . DILATION AND CURETTAGE OF UTERUS    . FLEXIBLE SIGMOIDOSCOPY N/A 11/24/2016   Procedure: FLEXIBLE SIGMOIDOSCOPY;  Surgeon: Iva Booparl E Gessner, MD;  [facility];  Service: Endoscopy;  Laterality: N/A;  . JOINT REPLACEMENT    .  OPEN REDUCTION INTERNAL FIXATION (ORIF) DISTAL RADIAL FRACTURE Left 12/2009   [name] 12/28/2009  . TOTAL KNEE ARTHROPLASTY Left 03/2010   [name] 04/07/2010  . TUBAL LIGATION    . VAGINAL HYSTERECTOMY  1980      Social History:     Social History  Substance Use Topics  . Smoking status: Never Smoker  . Smokeless tobacco: Never Used  . Alcohol use No     Lives - at [facility] - walks with walker   Family History :     Family History  Problem Relation Age of Onset  . Cancer Mother   . Heart disease Father   . Cancer Father       Home Medications:   Prior to Admission medications   Medication Sig Start Date End Date Taking? Authorizing Provider  acetaminophen (TYLENOL) 500 MG tablet Take 1,000 mg by mouth every 6 (six) hours as needed for headache (minor discomfort (contact MD if headache or pain persists for more than 24 hours).   Yes [provider]  baclofen (LIORESAL) 10 MG tablet Take 5 mg by mouth at bedtime as needed (back spasms).    Yes [provider]  budesonide-formoterol (SYMBICORT) 160-4.5 MCG/ACT inhaler Inhale 2 puffs into the lungs 2 (two) times daily. Rinse mouth after  use - discard 3 months after opening   Yes [provider]  calcitRIOL (ROCALTROL) 0.25 MCG capsule Take 0.25 mcg by mouth every Monday, Wednesday, and Friday. Do not crush   Yes [provider]  clonazePAM (KLONOPIN) 2 MG tablet Take 1 tablet (2 mg total) by mouth at bedtime. 09/30/16  Yes Young, Joni Fears D, MD  clopidogrel (PLAVIX) 75 MG tablet Take 1 tablet (75 mg total) by mouth daily. 06/05/16  Yes York Spaniel, MD  diltiazem (CARDIZEM CD) 120 MG 24 hr capsule Take 120 mg by mouth daily. 05/21/17  Yes [provider]  diltiazem (CARDIZEM) 30 MG tablet Take 30 mg by mouth at bedtime as needed (palpitations or tachycardia). Do not crush   Yes [provider]  diphenhydrAMINE (BENADRYL) 25 MG tablet Take 25 mg by mouth every 4 (four) hours as needed for itching (minor allergic reaction).   Yes [provider]  diphenoxylate-atropine (LOMOTIL) 2.5-0.025 MG tablet Take 1 tablet by mouth 3 (three) times daily as needed for diarrhea or loose stools. 11/26/16  Yes Wendee Beavers, DO  feeding supplement, ENSURE ENLIVE, (ENSURE ENLIVE) LIQD Take 237 mLs by mouth 3 (three) times daily between meals. 11/26/16  Yes Wendee Beavers, DO  levothyroxine (SYNTHROID, LEVOTHROID) 112 MCG tablet Take 112 mcg by mouth daily.    Yes [provider]  metoprolol tartrate (LOPRESSOR) 25 MG tablet Take 12.5 mg by mouth 2 (two) times daily.    Yes [provider]  Omega-3 Fatty Acids (FISH OIL) 1000 MG CAPS Take 1,000 mg by mouth daily. Do not crush   Yes [provider]  ondansetron (ZOFRAN) 4 MG tablet Take 4 mg by mouth every 8 (eight) hours as needed for nausea or vomiting.   Yes [provider]  oxyCODONE (OXY IR/ROXICODONE) 5 MG immediate release tablet Take 5 mg by mouth every 4 (four) hours as needed for breakthrough pain. 03/12/17  Yes [provider]  polyethylene glycol (MIRALAX / GLYCOLAX) packet Take 17 g by mouth daily as  needed (constipation). Mix in 8 oz of water and drink   Yes [provider]  sertraline (ZOLOFT) 100 MG  tablet Take 150 mg by mouth daily.   Yes [provider]  traMADol (ULTRAM) 50 MG tablet Take 50 mg by mouth See admin instructions. Take 1 tablet (50 mg) by mouth 3 times daily, may also take 1 tablet every 8 hours as needed for pain   Yes [provider]  traZODone (DESYREL) 100 MG tablet Take 1 tablet (100 mg total) by mouth at bedtime. 09/30/16  Yes Young, Joni Fears D, MD  memantine [facility] TITRATION PAK) tablet pack 5 mg/day for =1 week; 5 mg twice daily for =1 week; 15 mg/day given in 5 mg and 10 mg separated doses for =1 week; then 10 mg twice daily Patient not taking: Reported on 04/22/2017 01/20/17   York Spaniel, MD     Allergies:     Allergies  Allergen Reactions  . Vicodin [Hydrocodone-Acetaminophen] Nausea And Vomiting     Physical Exam:   Vitals  Blood pressure (!) 152/80, pulse 76, temperature 98.1 F (36.7 C), resp. rate 18, height 5\' 3"  (1.6 m), weight 56.7 kg (125 lb), SpO2 97 %.   1. General  lying in bed in NAD,   2. Normal affect and insight, Not Suicidal or Homicidal, Awake Alert, Oriented X 3.  3. No F.N deficits, ALL C.Nerves Intact, Strength 5/5 all 4 extremities, Sensation intact all 4 extremities, Plantars down going.  4. Ears and Eyes appear Normal, Conjunctivae clear, PERRLA. Moist Oral Mucosa.  5. Supple Neck, No JVD, No cervical lymphadenopathy appriciated, No Carotid Bruits.  6. Symmetrical Chest wall movement, Good air movement bilaterally, CTAB.  7. RRR, s1, s2 2/6 sem rusb, No Parasternal Heave.  8. Positive Bowel Sounds, Abdomen Soft, No tenderness, No organomegaly appriciated,No rebound -guarding or rigidity.  9.  No Cyanosis, Normal Skin Turgor, No Skin Rash or Bruise.  10. Good muscle tone,  joints appear normal , no effusions, Normal ROM.  11. No Palpable Lymph Nodes in Neck or Axillae     Data  Review:    CBC  Recent Labs Lab 05/26/17 0324  WBC 9.6  HGB 12.0  HCT 37.2  PLT 112*  MCV 95.6  MCH 30.8  MCHC 32.3  RDW 13.2  LYMPHSABS 1.2  MONOABS 0.4  EOSABS 0.0  BASOSABS 0.0   ------------------------------------------------------------------------------------------------------------------  Chemistries   Recent Labs Lab 05/26/17 0324  NA 142  K 2.7*  CL 102  CO2 30  GLUCOSE 83  BUN 19  CREATININE 1.47*  CALCIUM 9.1  AST 24  ALT 26  ALKPHOS 60  BILITOT 0.8   ------------------------------------------------------------------------------------------------------------------ estimated creatinine clearance is 28.2 mL/min (A) (by C-G formula based on SCr of 1.47 mg/dL (H)). ------------------------------------------------------------------------------------------------------------------ No results for input(s): TSH, T4TOTAL, T3FREE, THYROIDAB in the last 72 hours.  Invalid input(s): FREET3  Coagulation profile No results for input(s): INR, PROTIME in the last 168 hours. ------------------------------------------------------------------------------------------------------------------- No results for input(s): DDIMER in the last 72 hours. -------------------------------------------------------------------------------------------------------------------  Cardiac Enzymes No results for input(s): CKMB, TROPONINI, MYOGLOBIN in the last 168 hours.  Invalid input(s): CK ------------------------------------------------------------------------------------------------------------------ No results found for: BNP   ---------------------------------------------------------------------------------------------------------------  Urinalysis    Component Value Date/Time   COLORURINE STRAW (A) 11/19/2016 0600   APPEARANCEUR CLEAR 11/19/2016 0600   LABSPEC 1.010 11/19/2016 0600   PHURINE 6.0 11/19/2016 0600   GLUCOSEU NEGATIVE 11/19/2016 0600   HGBUR NEGATIVE  11/19/2016 0600   BILIRUBINUR NEGATIVE 11/19/2016 0600   KETONESUR 80 (A) 11/19/2016 0600   PROTEINUR NEGATIVE 11/19/2016 0600   UROBILINOGEN 1.0 01/05/2015 1430  NITRITE NEGATIVE 11/19/2016 0600   LEUKOCYTESUR NEGATIVE 11/19/2016 0600    ----------------------------------------------------------------------------------------------------------------   Imaging Results:    Ct Hip Right Wo Contrast  Result Date: 05/26/2017 CLINICAL DATA:  Fall with right hip pain EXAM: CT OF THE RIGHT HIP WITHOUT CONTRAST TECHNIQUE: Multidetector CT imaging of the right hip was performed according to the standard protocol. Multiplanar CT image reconstructions were also generated. COMPARISON:  Radiograph 05/25/2017, CT 11/18/2016 FINDINGS: Bones/Joint/Cartilage Comminuted fracture involving the greater trochanter of the right femur with mild separation of the fracture fragments. No definite extension of fracture lucency to the lesser trochanter. No femoral head dislocation. Pubic symphysis and rami are intact. No acetabular fracture is seen. The SI joint on the right is within normal limits. Ligaments Suboptimally assessed by CT. Muscles and Tendons Normal muscle bulk about the right hip. Soft tissues Edema within the soft tissues posterior and lateral to the right hip. IMPRESSION: Comminuted fracture involving the greater trochanter of the right femur with slight separation of fracture fragments. No dislocation. Electronically Signed   By: Jasmine Pang M.D.   On: 05/26/2017 02:47   Dg Hip Unilat  With Pelvis 2-3 Views Right  Result Date: 05/25/2017 CLINICAL DATA:  76 year old female with fall and right hip pain. EXAM: DG HIP (WITH OR WITHOUT PELVIS) 2-3V RIGHT COMPARISON:  None. FINDINGS: No acute fracture or dislocation. The bones are osteopenic. Moderate osteoarthritic changes of the hips. The soft tissues appear unremarkable. IMPRESSION: 1. No acute fracture or dislocation. 2. Osteopenia with moderate  osteoarthritis of the hips. Electronically Signed   By: Elgie Collard M.D.   On: 05/25/2017 23:17      Assessment & Plan:    Active Problems:   Femur fracture, right (HCC)   Hypokalemia    R femur fracture (comminuted) Please consult orthopedics this am  Hypokalemia Replete Check cmp in am  Mild renal insufficiency/ARF Hydrate with ns iv  Pafib  Cont plavix Continue cardizem ER 120mg  po qday  Cont metoprololol  Hypothyroidism Cont levothyroxine  Copd Cont symbicort   DVT Prophylaxis Heparin -  - SCDs   AM Labs Ordered, also please review Full Orders  Family Communication: Admission, patients condition and plan of care including tests being ordered have been discussed with the patient  who indicate understanding and agree with the plan and Code Status.  Code Status FULL CODE  Likely DC to  [facility] vs SNF  Condition GUARDED    Consults called: none  Admission status: inpatient   Time spent in minutes : 45 minutes   Pearson Grippe M.D on 05/26/2017 at 5:17 AM  Between 7am to 7pm - Pager - [phone]. After 7pm go to [email or web address] - password [facility]  [facility] - Office  [phone]

## 2017-05-26 NOTE — Consult Note (Signed)
Reason for Consult:Right hip fx Referring Physician: Khaleesi Gruel is an 76 y.o. female.  HPI: [name] was at home at the ALF where she [facility] when she slipped on some magazines and fell. She had immediate right hip pain and was unable to get up. She was brought in for evaluation by EMS and a CT scan showed a right trochanteric hip fx. Orthopedic surgery was consulted.  Past Medical History:  Diagnosis Date  . Abnormal heart rhythm   . Abnormality of gait 12/21/2015  . Afib ([place])    h/o  . Anxiety   . Arthritis    "hips, shoulders" (01/05/2015)  . Chronic kidney disease (CKD), stage III (moderate)    Archie Endo 01/05/2015  . Dementia   . Depression   . Echocardiogram abnormal 02/26/11   MVP,mild MR,AOV mildly sclerotic. EF >55%  . GERD (gastroesophageal reflux disease)   . Hypertension   . Hypothyroidism   . Memory difficulty 06/05/2016  . Nausea & vomiting 11/2016  . Osteoporosis   . Reactive airway disease   . Scoliosis   . Sleep apnea    "suppose to wear a mask; can't tolerate it" (01/05/2015)    Past Surgical History:  Procedure Laterality Date  . CATARACT EXTRACTION, BILATERAL Bilateral   . CESAREAN SECTION  1967  . CESAREAN SECTION  1971  . DILATION AND CURETTAGE OF UTERUS    . FLEXIBLE SIGMOIDOSCOPY N/A 11/24/2016   Procedure: FLEXIBLE SIGMOIDOSCOPY;  Surgeon: Gatha Mayer, MD;  Location: [facility] ENDOSCOPY;  Service: Endoscopy;  Laterality: N/A;  . JOINT REPLACEMENT    . OPEN REDUCTION INTERNAL FIXATION (ORIF) DISTAL RADIAL FRACTURE Left 12/2009   Archie Endo 12/28/2009  . TOTAL KNEE ARTHROPLASTY Left 03/2010   Archie Endo 04/07/2010  . TUBAL LIGATION    . VAGINAL HYSTERECTOMY  1980    Family History  Problem Relation Age of Onset  . Cancer Mother   . Heart disease Father   . Cancer Father     Social History:  reports that she has never smoked. She has never used smokeless tobacco. She reports that she does not drink alcohol or use drugs.  Allergies:  Allergies  Allergen  Reactions  . Vicodin [Hydrocodone-Acetaminophen] Nausea And Vomiting    Medications: I have reviewed the patient's current medications.  Results for orders placed or performed during the hospital encounter of 05/25/17 (from the past 48 hour(s))  CBC with Differential     Status: Abnormal   Collection Time: 05/26/17  3:24 AM  Result Value Ref Range   WBC 9.6 4.0 - 10.5 K/uL   RBC 3.89 3.87 - 5.11 MIL/uL   Hemoglobin 12.0 12.0 - 15.0 g/dL   HCT 37.2 36.0 - 46.0 %   MCV 95.6 78.0 - 100.0 fL   MCH 30.8 26.0 - 34.0 pg   MCHC 32.3 30.0 - 36.0 g/dL   RDW 13.2 11.5 - 15.5 %   Platelets 112 (L) 150 - 400 K/uL    Comment: SPECIMEN CHECKED FOR CLOTS REPEATED TO VERIFY PLATELET COUNT CONFIRMED BY SMEAR    Neutrophils Relative % 82 %   Neutro Abs 7.9 (H) 1.7 - 7.7 K/uL   Lymphocytes Relative 13 %   Lymphs Abs 1.2 0.7 - 4.0 K/uL   Monocytes Relative 5 %   Monocytes Absolute 0.4 0.1 - 1.0 K/uL   Eosinophils Relative 0 %   Eosinophils Absolute 0.0 0.0 - 0.7 K/uL   Basophils Relative 0 %   Basophils Absolute 0.0 0.0 -  0.1 K/uL  Comprehensive metabolic panel     Status: Abnormal   Collection Time: 05/26/17  3:24 AM  Result Value Ref Range   Sodium 142 135 - 145 mmol/L   Potassium 2.7 (LL) 3.5 - 5.1 mmol/L    Comment: CRITICAL RESULT CALLED TO, READ BACK BY AND VERIFIED WITH: CABLE M,RN 05/26/17 0423 WAYK    Chloride 102 101 - 111 mmol/L   CO2 30 22 - 32 mmol/L   Glucose, Bld 83 65 - 99 mg/dL   BUN 19 6 - 20 mg/dL   Creatinine, Ser 1.47 (H) 0.44 - 1.00 mg/dL   Calcium 9.1 8.9 - 10.3 mg/dL   Total Protein 7.1 6.5 - 8.1 g/dL   Albumin 3.7 3.5 - 5.0 g/dL   AST 24 15 - 41 U/L   ALT 26 14 - 54 U/L   Alkaline Phosphatase 60 38 - 126 U/L   Total Bilirubin 0.8 0.3 - 1.2 mg/dL   GFR calc non Af Amer 34 (L) >60 mL/min   GFR calc Af Amer 40 (L) >60 mL/min    Comment: (NOTE) The eGFR has been calculated using the CKD EPI equation. This calculation has not been validated in all clinical  situations. eGFR's persistently <60 mL/min signify possible Chronic Kidney Disease.    Anion gap 10 5 - 15  Urinalysis, Routine w reflex microscopic     Status: Abnormal   Collection Time: 05/26/17  6:02 AM  Result Value Ref Range   Color, Urine YELLOW YELLOW   APPearance CLEAR CLEAR   Specific Gravity, Urine 1.013 1.005 - 1.030   pH 6.0 5.0 - 8.0   Glucose, UA NEGATIVE NEGATIVE mg/dL   Hgb urine dipstick SMALL (A) NEGATIVE   Bilirubin Urine NEGATIVE NEGATIVE   Ketones, ur NEGATIVE NEGATIVE mg/dL   Protein, ur NEGATIVE NEGATIVE mg/dL   Nitrite NEGATIVE NEGATIVE   Leukocytes, UA NEGATIVE NEGATIVE   RBC / HPF 0-5 0 - 5 RBC/hpf   WBC, UA 0-5 0 - 5 WBC/hpf   Bacteria, UA NONE SEEN NONE SEEN   Squamous Epithelial / LPF 0-5 (A) NONE SEEN    Ct Hip Right Wo Contrast  Result Date: 05/26/2017 CLINICAL DATA:  Fall with right hip pain EXAM: CT OF THE RIGHT HIP WITHOUT CONTRAST TECHNIQUE: Multidetector CT imaging of the right hip was performed according to the standard protocol. Multiplanar CT image reconstructions were also generated. COMPARISON:  Radiograph 05/25/2017, CT 11/18/2016 FINDINGS: Bones/Joint/Cartilage Comminuted fracture involving the greater trochanter of the right femur with mild separation of the fracture fragments. No definite extension of fracture lucency to the lesser trochanter. No femoral head dislocation. Pubic symphysis and rami are intact. No acetabular fracture is seen. The SI joint on the right is within normal limits. Ligaments Suboptimally assessed by CT. Muscles and Tendons Normal muscle bulk about the right hip. Soft tissues Edema within the soft tissues posterior and lateral to the right hip. IMPRESSION: Comminuted fracture involving the greater trochanter of the right femur with slight separation of fracture fragments. No dislocation. Electronically Signed   By: Donavan Foil M.D.   On: 05/26/2017 02:47   Dg Hip Unilat  With Pelvis 2-3 Views Right  Result Date:  05/25/2017 CLINICAL DATA:  76 year old female with fall and right hip pain. EXAM: DG HIP (WITH OR WITHOUT PELVIS) 2-3V RIGHT COMPARISON:  None. FINDINGS: No acute fracture or dislocation. The bones are osteopenic. Moderate osteoarthritic changes of the hips. The soft tissues appear unremarkable. IMPRESSION: 1. No acute  fracture or dislocation. 2. Osteopenia with moderate osteoarthritis of the hips. Electronically Signed   By: Anner Crete M.D.   On: 05/25/2017 23:17    Review of Systems  Constitutional: Negative for weight loss.  HENT: Negative for ear discharge, ear pain, hearing loss and tinnitus.   Eyes: Negative for blurred vision, double vision, photophobia and pain.  Respiratory: Negative for cough, sputum production and shortness of breath.   Cardiovascular: Negative for chest pain.  Gastrointestinal: Negative for abdominal pain, nausea and vomiting.  Genitourinary: Negative for dysuria, flank pain, frequency and urgency.  Musculoskeletal: Positive for joint pain (Right hip). Negative for back pain, falls, myalgias and neck pain.  Neurological: Negative for dizziness, tingling, sensory change, focal weakness, loss of consciousness and headaches.  Endo/Heme/Allergies: Does not bruise/bleed easily.  Psychiatric/Behavioral: Negative for depression, memory loss and substance abuse. The patient is not nervous/anxious.    Blood pressure (!) 160/72, pulse 72, temperature 98.1 F (36.7 C), resp. rate 18, height 5' 3"  (1.6 m), weight 56.7 kg (125 lb), SpO2 98 %. Physical Exam  Constitutional: She appears well-developed and well-nourished. No distress.  HENT:  Head: Normocephalic.  Eyes: Conjunctivae are normal. Right eye exhibits no discharge. Left eye exhibits no discharge. No scleral icterus.  Cardiovascular: Normal rate.   Respiratory: Effort normal. No respiratory distress.  Musculoskeletal:  Bilateral shoulder, elbow, wrist, digits- no skin wounds, nontender, no instability, no blocks  to motion  Sens  Ax/R/M/U intact  Mot   Ax/ R/ PIN/ M/ AIN/ U intact  Rad 2+  RLE No traumatic wounds, ecchymosis, or rash  TTP hip to knee  No effusions  Knee stable to varus/ valgus and anterior/posterior stress but limited 2/2 pain  Sens DPN, SPN, TN intact  Motor EHL, ext, flex, evers 5/5  DP 2+, PT 2+, No significant edema   LLE No traumatic wounds, ecchymosis, or rash  Nontender  No effusions  Knee stable to varus/ valgus and anterior/posterior stress  Sens DPN, SPN, TN intact  Motor EHL, ext, flex, evers 5/5  DP 2+, PT 2+, No significant edema  Neurological: She is alert.  Skin: Skin is warm and dry. She is not diaphoretic.  Psychiatric: She has a normal mood and affect. Her behavior is normal.    Assessment/Plan: Fall Right trochanteric femur fx -- Unusual pattern but after much discussion and review by radiology it appears that it is likely stable. Dr. Percell Miller to meet and discuss with family tomorrow but would favor a trial of non-operative treatment. Right knee pain -- Will check x-rays  Multiple medical problems -- per primary service    Lisette Abu, PA-C Orthopedic Surgery [phone] 05/26/2017, 9:00 AM

## 2017-05-26 NOTE — ED Notes (Signed)
[name] (Daughter) [phone]

## 2017-05-26 NOTE — ED Notes (Signed)
Called patients daughter Elita Quickam and informed her of her mothers plan of care

## 2017-05-26 NOTE — ED Notes (Signed)
Consulting MD at bedside (Hospitalist). 

## 2017-05-27 ENCOUNTER — Encounter (HOSPITAL_COMMUNITY)
Admission: EM | Disposition: A | Discharge status: Skilled Nursing Facility | Payer: Self-pay | Source: Home / Self Care | Attending: Emergency Medicine

## 2017-05-27 ENCOUNTER — Observation Stay (HOSPITAL_COMMUNITY): Payer: Medicare Other

## 2017-05-27 DIAGNOSIS — R140 Abdominal distension (gaseous): Secondary | ICD-10-CM | POA: Diagnosis not present

## 2017-05-27 DIAGNOSIS — I10 Essential (primary) hypertension: Secondary | ICD-10-CM | POA: Diagnosis not present

## 2017-05-27 DIAGNOSIS — E876 Hypokalemia: Secondary | ICD-10-CM | POA: Diagnosis not present

## 2017-05-27 DIAGNOSIS — N183 Chronic kidney disease, stage 3 (moderate): Secondary | ICD-10-CM | POA: Diagnosis not present

## 2017-05-27 DIAGNOSIS — S72114A Nondisplaced fracture of greater trochanter of right femur, initial encounter for closed fracture: Secondary | ICD-10-CM | POA: Diagnosis not present

## 2017-05-27 LAB — CBC
HEMATOCRIT: 33 % — AB (ref 36.0–46.0)
Hemoglobin: 10.7 g/dL — ABNORMAL LOW (ref 12.0–15.0)
MCH: 30.7 pg (ref 26.0–34.0)
MCHC: 32.4 g/dL (ref 30.0–36.0)
MCV: 94.6 fL (ref 78.0–100.0)
PLATELETS: 86 10*3/uL — AB (ref 150–400)
RBC: 3.49 MIL/uL — ABNORMAL LOW (ref 3.87–5.11)
RDW: 12.9 % (ref 11.5–15.5)
WBC: 8.7 10*3/uL (ref 4.0–10.5)

## 2017-05-27 LAB — BASIC METABOLIC PANEL
Anion gap: 9 (ref 5–15)
BUN: 19 mg/dL (ref 6–20)
CALCIUM: 9 mg/dL (ref 8.9–10.3)
CO2: 28 mmol/L (ref 22–32)
CREATININE: 1.37 mg/dL — AB (ref 0.44–1.00)
Chloride: 100 mmol/L — ABNORMAL LOW (ref 101–111)
GFR calc Af Amer: 43 mL/min — ABNORMAL LOW (ref >60)
GFR, EST NON AFRICAN AMERICAN: 37 mL/min — AB (ref >60)
GLUCOSE: 106 mg/dL — AB (ref 65–99)
Potassium: 3.9 mmol/L (ref 3.5–5.1)
Sodium: 137 mmol/L (ref 135–145)

## 2017-05-27 LAB — TSH: TSH: 1.254 u[IU]/mL (ref 0.350–4.500)

## 2017-05-27 SURGERY — FIXATION, FRACTURE, INTERTROCHANTERIC, WITH INTRAMEDULLARY ROD
Anesthesia: General | Laterality: Right

## 2017-05-27 MED ORDER — SENNOSIDES-DOCUSATE SODIUM 8.6-50 MG PO TABS
1.0000 | ORAL_TABLET | Freq: Two times a day (BID) | ORAL | Status: DC
  Administered 2017-05-27 – 2017-05-28 (×3): 1 via ORAL
  Filled 2017-05-27 (×3): qty 1

## 2017-05-27 NOTE — Progress Notes (Signed)
RT set up CPAP and bled 2L O2 into circuit and placed mask on patient. Patient tolerating well at this time. RT will monitor as needed.

## 2017-05-27 NOTE — Progress Notes (Addendum)
[facility]                                                                              Patient Demographics  [name], is a 76 y.o. female, DOB - 1941-03-05, [id number]  Admit date - 05/25/2017   Admitting Physician Pearson GrippeJames Kim, MD  Outpatient Primary MD for the patient is Shelbie AmmonsHaque, Imran P, MD  Outpatient specialists:   LOS - 1  days   Medical records reviewed and are as summarized below:    Chief Complaint  Patient presents with  . Fall  . Hip Pain       Brief summary  The patient is a 76 year old female with a history of dementia, paroxysmal atrial tachycardia/palpitations, OSA on CPAP, chronic kidney disease, stage III apparently slipped on some magazines on the ground and fell. Patient was found to have comminuted fracture of the greater trochanter of the right femur.    Assessment & Plan    Principal Problem:   Femur fracture, right (HCC) - Secondary to mechanical fall - CT of the right hip showed comminuted fractures involving the greater trochanter of the right femur with slight separation of the fracture fragments, no dislocation - Orthopedics consulted, recommended nonoperative treatment - PT OT consulted   Active Problems:   Obstructive sleep apnea -CPAP qhs    Essential hypertension - Currently stable, continue metoprolol     Chronic kidney disease, stage 3 -Appears to be at baseline, creatinine improving 1.3    Paroxysmal atrial tachycardia (HCC) - Continue metoprolol, Cardizem - Rate controlled, continue Plavix    Hypothyroidism - TSH 1.2, continue Synthroid - TSH was 0.3 on 11/19/16    Hypokalemia - Resolved  Mild fever - Unclear etiology, UA negative for UTI - No leukocytosis, no symptomatic etiology, coughing - Has mild diffuse abdominal tenderness, will obtain KUB Addendum: abd Xray: neg for ileus or sbo, stool burden +  Constipation - Add Senokot-S, continue MiraLAX  Code Status: full DVT Prophylaxis:    heparin  Family Communication: Discussed in detail with the patient, all imaging results, lab results explained to the patient    Disposition Plan:   Time Spent in minutes  25 minutes  Procedures:  None  Consultants:   Orthopedics  Antimicrobials :      Medications  Scheduled Meds: . calcitRIOL  0.25 mcg Oral Q M,W,F  . clonazePAM  2 mg Oral QHS  . clopidogrel  75 mg Oral Daily  . diltiazem  120 mg Oral Daily  . feeding supplement (ENSURE ENLIVE)  237 mL Oral TID BM  . heparin  5,000 Units Subcutaneous Q8H  . levothyroxine  112 mcg Oral QAC breakfast  . metoprolol tartrate  12.5 mg Oral BID  . mometasone-formoterol  2 puff Inhalation BID  . omega-3 acid ethyl esters  1 g Oral Daily  . sertraline  150 mg Oral Daily  . sodium chloride flush  3 mL Intravenous Q12H  . traZODone  100 mg Oral QHS   Continuous Infusions:  PRN Meds:.acetaminophen, baclofen, diphenhydrAMINE, diphenoxylate-atropine, hydrALAZINE, morphine injection, ondansetron, oxyCODONE, polyethylene glycol   Antibiotics   Anti-infectives    None  Subjective:   [name] was seen and examined today. Low-grade temp last night, 100.21F.  currently pain in the right leg controlled.   Patient denies dizziness, chest pain, shortness of breath.  Has mild diffuse abdominal tenderness on exam, constipated, no BM for last 2 days per patient  Objective:   Vitals:   05/26/17 2022 05/26/17 2025 05/27/17 0559 05/27/17 0906  BP:  (!) 148/62 (!) 133/58   Pulse:  86 67   Resp:  16 16   Temp:  (!) 100.4 F (38 C) 98.5 F (36.9 C)   TempSrc:  Oral Oral   SpO2: 99% 100% 100% 98%  Weight:      Height:        Intake/Output Summary (Last 24 hours) at 05/27/17 1308 Last data filed at 05/27/17 0952  Gross per 24 hour  Intake           1037.5 ml  Output             1400 ml  Net           -362.5 ml     Wt Readings from Last 3 Encounters:  05/25/17 56.7 kg (125 lb)  04/22/17 51.1 kg (112 lb 9.6  oz)  01/14/17 49.5 kg (109 lb 3.2 oz)     Exam Physical Exam  General: Alert and oriented x 3, NAD  Eyes:   HEENT:   Cardiovascular: S1 S2 auscultated, no rubs, murmurs or gallops. Regular rate and rhythm. No pedal edema b/l  Respiratory: Clear to auscultation bilaterally, no wheezing, rales or rhonchi  Gastrointestinal: Soft, mild diffuse tenderness, nondistended, + bowel sounds  Ext: no pedal edema bilaterally  Neuro: no new deficits  Musculoskeletal: No digital cyanosis, clubbing  Skin: No rashes  Psych: Normal affect and demeanor, alert and oriented x3    Data Reviewed:  I have personally reviewed following labs and imaging studies  Micro Results No results found for this or any previous visit (from the past 240 hour(s)).  Radiology Reports Ct Hip Right Wo Contrast  Result Date: 05/26/2017 CLINICAL DATA:  Fall with right hip pain EXAM: CT OF THE RIGHT HIP WITHOUT CONTRAST TECHNIQUE: Multidetector CT imaging of the right hip was performed according to the standard protocol. Multiplanar CT image reconstructions were also generated. COMPARISON:  Radiograph 05/25/2017, CT 11/18/2016 FINDINGS: Bones/Joint/Cartilage Comminuted fracture involving the greater trochanter of the right femur with mild separation of the fracture fragments. No definite extension of fracture lucency to the lesser trochanter. No femoral head dislocation. Pubic symphysis and rami are intact. No acetabular fracture is seen. The SI joint on the right is within normal limits. Ligaments Suboptimally assessed by CT. Muscles and Tendons Normal muscle bulk about the right hip. Soft tissues Edema within the soft tissues posterior and lateral to the right hip. IMPRESSION: Comminuted fracture involving the greater trochanter of the right femur with slight separation of fracture fragments. No dislocation. Electronically Signed   By: Jasmine Pang M.D.   On: 05/26/2017 02:47   Dg Knee Complete 4 Views Right  Result  Date: 05/26/2017 CLINICAL DATA:  Diffuse right knee pain. Fall 2 days ago. Initial encounter. EXAM: RIGHT KNEE - COMPLETE 4+ VIEW COMPARISON:  None. FINDINGS: No evidence of fracture, dislocation, or joint effusion. No evidence of arthropathy or other focal bone abnormality. Soft tissues are unremarkable. Osteopenia. IMPRESSION: Negative. Electronically Signed   By: Marnee Spring M.D.   On: 05/26/2017 11:18   Dg Hip Unilat  With Pelvis 2-3  Views Right  Result Date: 05/25/2017 CLINICAL DATA:  76 year old female with fall and right hip pain. EXAM: DG HIP (WITH OR WITHOUT PELVIS) 2-3V RIGHT COMPARISON:  None. FINDINGS: No acute fracture or dislocation. The bones are osteopenic. Moderate osteoarthritic changes of the hips. The soft tissues appear unremarkable. IMPRESSION: 1. No acute fracture or dislocation. 2. Osteopenia with moderate osteoarthritis of the hips. Electronically Signed   By: Elgie Collard M.D.   On: 05/25/2017 23:17    Lab Data:  CBC:  Recent Labs Lab 05/26/17 0324 05/27/17 0425  WBC 9.6 8.7  NEUTROABS 7.9*  --   HGB 12.0 10.7*  HCT 37.2 33.0*  MCV 95.6 94.6  PLT 112* 86*   Basic Metabolic Panel:  Recent Labs Lab 05/26/17 0324 05/26/17 0840 05/26/17 1024 05/27/17 0425  NA 142  --   --  137  K 2.7*  --  4.2 3.9  CL 102  --   --  100*  CO2 30  --   --  28  GLUCOSE 83  --   --  106*  BUN 19  --   --  19  CREATININE 1.47*  --   --  1.37*  CALCIUM 9.1  --   --  9.0  MG  --  1.8  --   --    GFR: Estimated Creatinine Clearance: 30.3 mL/min (A) (by C-G formula based on SCr of 1.37 mg/dL (H)). Liver Function Tests:  Recent Labs Lab 05/26/17 0324  AST 24  ALT 26  ALKPHOS 60  BILITOT 0.8  PROT 7.1  ALBUMIN 3.7   No results for input(s): LIPASE, AMYLASE in the last 168 hours. No results for input(s): AMMONIA in the last 168 hours. Coagulation Profile:  Recent Labs Lab 05/26/17 0840  INR 1.06   Cardiac Enzymes: No results for input(s): CKTOTAL, CKMB,  CKMBINDEX, TROPONINI in the last 168 hours. BNP (last 3 results) No results for input(s): PROBNP in the last 8760 hours. HbA1C: No results for input(s): HGBA1C in the last 72 hours. CBG: No results for input(s): GLUCAP in the last 168 hours. Lipid Profile: No results for input(s): CHOL, HDL, LDLCALC, TRIG, CHOLHDL, LDLDIRECT in the last 72 hours. Thyroid Function Tests:  Recent Labs  05/27/17 0425  TSH 1.254   Anemia Panel: No results for input(s): VITAMINB12, FOLATE, FERRITIN, TIBC, IRON, RETICCTPCT in the last 72 hours. Urine analysis:    Component Value Date/Time   COLORURINE YELLOW 05/26/2017 0602   APPEARANCEUR CLEAR 05/26/2017 0602   LABSPEC 1.013 05/26/2017 0602   PHURINE 6.0 05/26/2017 0602   GLUCOSEU NEGATIVE 05/26/2017 0602   HGBUR SMALL (A) 05/26/2017 0602   BILIRUBINUR NEGATIVE 05/26/2017 0602   KETONESUR NEGATIVE 05/26/2017 0602   PROTEINUR NEGATIVE 05/26/2017 0602   UROBILINOGEN 1.0 01/05/2015 1430   NITRITE NEGATIVE 05/26/2017 0602   LEUKOCYTESUR NEGATIVE 05/26/2017 0602     Ripudeep Rai M.D. [facility] 05/27/2017, 1:08 PM  Pager: [phone] Between 7am to 7pm - call Pager - [phone]  After 7pm go to [email or web address] - password [email or web address]  Call night coverage person covering after 7pm

## 2017-05-27 NOTE — Care Management CC44 (Signed)
Condition Code 44 Documentation Completed  Patient Details  Name: Rose FillersJudy C Ross MRN: [id number] Date of Birth: 03-06-1941   Condition Code 44 given:  Yes Patient signature on Condition Code 44 notice:  Yes Documentation of 2 MD's agreement:  Yes Code 44 added to claim:  Yes    Durenda GuthrieBrady, Maynard David Naomi, RN 05/27/2017, 9:40 AM

## 2017-05-27 NOTE — Care Management Obs Status (Signed)
MEDICARE OBSERVATION STATUS NOTIFICATION   Patient Details  Name: [name] MRN: [id number] Date of Birth: 1940-11-24   Medicare Observation Status Notification Given:  Yes Patient's daughter, [name], [place] signed notification.    Durenda GuthrieBrady, Dago Jungwirth Naomi, RN 05/27/2017, 9:39 AM

## 2017-05-27 NOTE — NC FL2 (Signed)
[place] MEDICAID FL2 LEVEL OF CARE SCREENING TOOL     IDENTIFICATION  Patient Name: [name] Birthdate: 12/31/1940 Sex: female Admission Date (Current Location): 05/25/2017  [facility] and [place] Number:  Producer, television/film/video and Address:  [facility] [place], [address] [place], [place] [address]      Provider Number: [id number]  Attending Physician Name and Address:  Cathren Harsh, MD  Relative Name and Phone Number:   Theone Stanley, daughter,  [phone]    Current Level of Care: Hospital Recommended Level of Care: Skilled Nursing Facility Prior Approval Number:    Date Approved/Denied: 05/27/17 PASRR Number: [id number]  Discharge Plan: Home    Current Diagnoses: Patient Active Problem List   Diagnosis Date Noted  . Femur fracture, right (HCC) 05/26/2017  . Hypokalemia 05/26/2017  . Severely underweight adult 01/02/2017  . Diarrhea   . Colitis   . Dehydration 11/18/2016  . AKI (acute kidney injury) (HCC) 11/18/2016  . Abdominal pain   . Intractable vomiting with nausea   . Renal insufficiency   . Insomnia 09/30/2016  . Memory difficulty 06/05/2016  . Abnormality of gait 12/21/2015  . Cytopenia 02/09/2015  . Protein-calorie malnutrition, severe (HCC) 01/07/2015  . Vomiting and diarrhea 01/05/2015  . Sleep walking 10/08/2014  . Restless legs 10/08/2014  . Essential hypertension 09/16/2014  . Chronic kidney disease, stage 3 09/16/2014  . Paroxysmal atrial tachycardia (HCC) 09/16/2014  . Hypothyroidism 09/16/2014  . Obstructive sleep apnea 07/18/2011    Orientation RESPIRATION BLADDER Height & Weight     Self, Time, Situation, Place  O2 (Nasal Cannula 2L) Continent Weight: 125 lb (56.7 kg) Height:  5\' 3"  (160 cm)  BEHAVIORAL SYMPTOMS/MOOD NEUROLOGICAL BOWEL NUTRITION STATUS      Continent Diet (See DC Summary)  AMBULATORY STATUS COMMUNICATION OF NEEDS Skin   Extensive Assist Verbally Normal, Other (Comment) (Right Hip Pain,  Right Hip fracture)                       Personal Care Assistance Level of Assistance  Bathing, Feeding Bathing Assistance: Limited assistance Feeding assistance: Limited assistance Dressing Assistance: Limited assistance     Functional Limitations Info             SPECIAL CARE FACTORS FREQUENCY  PT (By licensed PT)     PT Frequency: 2xweek              Contractures      Additional Factors Info  Code Status, Psychotropic Code Status Info: Full Code   Psychotropic Info: clonopin, zoloft, trazadone         Current Medications (05/27/2017):  This is the current hospital active medication list Current Facility-Administered Medications  Medication Dose Route Frequency Provider Last Rate Last Dose  . acetaminophen (TYLENOL) tablet 1,000 mg  1,000 mg Oral Q6H PRN Pearson Grippe, MD   1,000 mg at 05/26/17 2056  . baclofen (LIORESAL) tablet 5 mg  5 mg Oral QHS PRN Pearson Grippe, MD      . calcitRIOL (ROCALTROL) capsule 0.25 mcg  0.25 mcg Oral Q M,W,F Pearson Grippe, MD   0.25 mcg at 05/26/17 0957  . clonazePAM (KLONOPIN) tablet 2 mg  2 mg Oral QHS Pearson Grippe, MD   2 mg at 05/26/17 2057  . clopidogrel (PLAVIX) tablet 75 mg  75 mg Oral Daily Pearson Grippe, MD   75 mg at 05/27/17 0919  . diltiazem (CARDIZEM CD) 24 hr capsule 120 mg  120 mg Oral Daily Pearson GrippeKim, James, MD   120 mg at 05/27/17 [id number]  . diphenhydrAMINE (BENADRYL) capsule 25 mg  25 mg Oral Q4H PRN Pearson GrippeKim, James, MD      . diphenoxylate-atropine (LOMOTIL) 2.5-0.025 MG per tablet 1 tablet  1 tablet Oral TID PRN Pearson GrippeKim, James, MD      . feeding supplement (ENSURE ENLIVE) (ENSURE ENLIVE) liquid 237 mL  237 mL Oral TID BM Pearson GrippeKim, James, MD   237 mL at 05/27/17 0919  . heparin injection 5,000 Units  5,000 Units Subcutaneous Q8H Pearson GrippeKim, James, MD      . hydrALAZINE (APRESOLINE) injection 10 mg  10 mg Intravenous Q6H PRN Pearson GrippeKim, James, MD      . levothyroxine (SYNTHROID, LEVOTHROID) tablet 112 mcg  112 mcg Oral QAC breakfast Pearson GrippeKim, James, MD   112 mcg at  05/27/17 [phone]  . metoprolol tartrate (LOPRESSOR) tablet 12.5 mg  12.5 mg Oral BID Pearson GrippeKim, James, MD   12.5 mg at 05/27/17 [id number]  . mometasone-formoterol (DULERA) 200-5 MCG/ACT inhaler 2 puff  2 puff Inhalation BID Pearson GrippeKim, James, MD   2 puff at 05/27/17 0906  . morphine 4 MG/ML injection 1 mg  1 mg Intravenous Q4H PRN Pearson GrippeKim, James, MD   1 mg at 05/27/17 1149  . omega-3 acid ethyl esters (LOVAZA) capsule 1 g  1 g Oral Daily Pearson GrippeKim, James, MD   1 g at 05/27/17 0919  . ondansetron (ZOFRAN) tablet 4 mg  4 mg Oral Q8H PRN Pearson GrippeKim, James, MD      . oxyCODONE (Oxy IR/ROXICODONE) immediate release tablet 5 mg  5 mg Oral Q4H PRN Pearson GrippeKim, James, MD   5 mg at 05/27/17 [id number]  . polyethylene glycol (MIRALAX / GLYCOLAX) packet 17 g  17 g Oral Daily PRN Pearson GrippeKim, James, MD      . senna-docusate (Senokot-S) tablet 1 tablet  1 tablet Oral BID Rai, Ripudeep K, MD      . sertraline (ZOLOFT) tablet 150 mg  150 mg Oral Daily Pearson GrippeKim, James, MD   150 mg at 05/27/17 [id number]  . sodium chloride flush (NS) 0.9 % injection 3 mL  3 mL Intravenous Q12H Pearson GrippeKim, James, MD   3 mL at 05/27/17 0919  . traZODone (DESYREL) tablet 100 mg  100 mg Oral QHS Pearson GrippeKim, James, MD   100 mg at 05/26/17 2056     Discharge Medications: Please see discharge summary for a list of discharge medications.  Relevant Imaging Results:  Relevant Lab Results:   Additional Information SS: [id number]   Tresa MoorePatricia V Deovion Batrez, LCSW

## 2017-05-27 NOTE — Clinical Social Work Note (Signed)
Clinical Social Work Assessment  Patient Details  Name: [name] MRN: [id number] Date of Birth: 1941-07-03  Date of referral:  05/27/17               Reason for consult:                   Permission sought to share information with:  Facility Art therapist granted to share information::  Yes, Verbal Permission Granted  Name::     [name]  Agency::  SNF  Relationship::  daughter  Contact Information:     Housing/Transportation Living arrangements for the past 2 months:  [place] of Information:  Patient, Adult Children Patient Interpreter Needed:  None Criminal Activity/Legal Involvement Pertinent to Current Situation/Hospitalization:  No - Comment as needed Significant Relationships:  Adult Children Lives with:  Facility Resident Do you feel safe going back to the place where you live?  Yes Need for family participation in patient care:  Yes (Comment)  Care giving concerns: Patient from [place] and had an injury in facility. Pt unable to return for short term rehab because she is a 2 person assist and SNF can only provide 1 person assist. Pt not safe to return back to ALF at this time.   Social Worker assessment / plan:  CSW met with patient at bedside to discuss plan for DC. Patient aware and has good understanding. CSW then f/u with daughter [name] to advise of plan. CSW obtained permission to send out bed offers to SNF's in [place].  FL2 complete. Passr Obtained. Offers sent.  Employment status:  Retired Nurse, adult PT Recommendations:  [facility] / Referral to community resources:  [place]  Patient/Family's Response to care:  Psychologist, prison and probation services of CSW assistance. No issues or concerns at this time.  Patient/Family's Understanding of and Emotional Response to Diagnosis, Current Treatment, and Prognosis:  Patient/family has good  understanding of diagnosis, current treatment and prognosis. Family hopeful that short term rehab will address impairment. No issues or concerns at this time.  Emotional Assessment Appearance:  Appears older than stated age Attitude/Demeanor/Rapport:  Other (Cooperative, however does seema bit confused) Affect (typically observed):  Accepting, Appropriate Orientation:  Oriented to Self Alcohol / Substance use:  Not Applicable Psych involvement (Current and /or in the community):  No (Comment)  Discharge Needs  Concerns to be addressed:  Care Coordination Readmission within the last 30 days:  Yes Current discharge risk:  Dependent with Mobility, Physical Impairment Barriers to Discharge:  No Barriers Identified   Ana Baxter, LCSW 05/27/2017, 3:49 PM

## 2017-05-27 NOTE — Evaluation (Signed)
Physical Therapy Evaluation Patient Details Name: Ana FillersJudy C Foster MRN: [id number] DOB: 1941-07-14 Today's Date: 05/27/2017   History of Present Illness  [name] a 76 y.o.female,w Dementia, Pafib, OSA on cpap CKD stage3 apparently slipped on some magazines on the ground and fell. Pt found to have comminuted fracture of the greater trochanter of R femur. Ortho decided to treat non-operatively. R LE PWB at 50% for 6 weeks.   Clinical Impression  Pt admitted with above. Pt was in ALF and amb indep with RW but now requires assistx2 for all transfers and ADLs. Pt unsafe to return to ALF apartment at this time and will benefit form ST-SNF upon d/c to maximize functional recovery.    Follow Up Recommendations SNF;Supervision/Assistance - 24 hour    Equipment Recommendations  None recommended by PT    Recommendations for Other Services       Precautions / Restrictions Precautions Precautions: Fall Precaution Comments: R hip fx and pain Restrictions Weight Bearing Restrictions: Yes RLE Weight Bearing: Partial weight bearing RLE Partial Weight Bearing Percentage or Pounds: 50%      Mobility  Bed Mobility Overal bed mobility: Needs Assistance Bed Mobility: Supine to Sit     Supine to sit: Mod assist;+2 for physical assistance     General bed mobility comments: modA for R LE mangement and trunk elevation, minA to scoot to EOB  Transfers Overall transfer level: Needs assistance Equipment used: Rolling walker (2 wheeled) Transfers: Sit to/from UGI CorporationStand;Stand Pivot Transfers Sit to Stand: Max assist;+2 physical assistance Stand pivot transfers: Max assist;+2 physical assistance       General transfer comment: pt very guarded due to R hip pain, pt only able to place R toes on ground, once up pt was able to pivot on L foot but impulsively sat in the chair, assist for walker management  Ambulation/Gait             General Gait Details: unable to at this  time  Stairs            Wheelchair Mobility    Modified Rankin (Stroke Patients Only)       Balance Overall balance assessment: Needs assistance Sitting-balance support: Feet supported;Bilateral upper extremity supported Sitting balance-Leahy Scale: Poor Sitting balance - Comments: needs UE support to prevent leaning   Standing balance support: Bilateral upper extremity supported Standing balance-Leahy Scale: Poor Standing balance comment: dependent on physical assist and/or walker due to R LE pain and minimal WBing tolerance                             Pertinent Vitals/Pain Pain Assessment: Faces Faces Pain Scale: Hurts whole lot Pain Location: R LE with movement Pain Descriptors / Indicators: Grimacing Pain Intervention(s): Monitored during session    Home Living Family/patient expects to be discharged to:: Skilled nursing facility                 Additional Comments: lived in ALFs    Prior Function Level of Independence: Independent with assistive device(s)         Comments: Used RW at all times with Modif I.  Walked entire facility per pt.      Hand Dominance   Dominant Hand: Right    Extremity/Trunk Assessment   Upper Extremity Assessment Upper Extremity Assessment: Overall WFL for tasks assessed    Lower Extremity Assessment Lower Extremity Assessment: RLE deficits/detail RLE Deficits / Details: pt with minimal voluntary movement due  to pain    Cervical / Trunk Assessment Cervical / Trunk Assessment: Kyphotic  Communication   Communication: No difficulties  Cognition Arousal/Alertness: Awake/alert Behavior During Therapy: WFL for tasks assessed/performed Overall Cognitive Status: History of cognitive impairments - at baseline                                        General Comments      Exercises     Assessment/Plan    PT Assessment Patient needs continued PT services  PT Problem List Decreased  strength;Decreased range of motion;Decreased activity tolerance;Decreased balance;Decreased mobility;Decreased coordination;Decreased cognition;Decreased knowledge of use of DME;Decreased safety awareness;Pain       PT Treatment Interventions DME instruction;Gait training;Stair training;Functional mobility training;Therapeutic activities;Therapeutic exercise;Balance training    PT Goals (Current goals can be found in the Care Plan section)  Acute Rehab PT Goals Patient Stated Goal: stop the pain PT Goal Formulation: With patient Time For Goal Achievement: 06/10/17 Potential to Achieve Goals: Fair    Frequency Min 2X/week   Barriers to discharge Decreased caregiver support lives in ALF    Co-evaluation               AM-PAC PT "6 Clicks" Daily Activity  Outcome Measure Difficulty turning over in bed (including adjusting bedclothes, sheets and blankets)?: Total Difficulty moving from lying on back to sitting on the side of the bed? : Total Difficulty sitting down on and standing up from a chair with arms (e.g., wheelchair, bedside commode, etc,.)?: Total Help needed moving to and from a bed to chair (including a wheelchair)?: Total Help needed walking in hospital room?: Total Help needed climbing 3-5 steps with a railing? : Total 6 Click Score: 6    End of Session Equipment Utilized During Treatment: Back brace;Oxygen (2Lo2 via [place]) Activity Tolerance: Patient limited by pain Patient left: in chair;with bed alarm set Nurse Communication: Mobility status PT Visit Diagnosis: Unsteadiness on feet (R26.81);Pain;Difficulty in walking, not elsewhere classified (R26.2) Pain - Right/Left: Right Pain - part of body: Hip    Time: 8657-8469 PT Time Calculation (min) (ACUTE ONLY): 24 min   Charges:   PT Evaluation $PT Eval Moderate Complexity: 1 Procedure PT Treatments $Therapeutic Activity: 8-22 mins   PT G Codes:   PT G-Codes **NOT FOR INPATIENT CLASS** Functional  Assessment Tool Used: Clinical judgement Functional Limitation: Mobility: Walking and moving around Mobility: Walking and Moving Around Current Status (G2952): At least 60 percent but less than 80 percent impaired, limited or restricted Mobility: Walking and Moving Around Goal Status [phone]): At least 20 percent but less than 40 percent impaired, limited or restricted    Lewis Shock, PT, DPT Pager #: [phone] Office #: [phone]   Rozell Searing [name] 05/27/2017, 1:39 PM

## 2017-05-27 NOTE — [place] FL2 (Signed)
[place] MEDICAID FL2 LEVEL OF CARE SCREENING TOOL     IDENTIFICATION  Patient Name: [name] Birthdate: 06/03/1941 Sex: female Admission Date (Current Location): 05/25/2017  [facility] [place]   Facility and Address:  [facility], [address], [place], [place] [address]      Provider Number: [id number]  Attending Physician Name and Address:  Cathren Harshai, Ripudeep K, MD  Relative Name and Phone Number:       Current Level of Care: Hospital Recommended Level of Care: Skilled Nursing Facility Prior Approval Number:    Date Approved/Denied: 05/27/17 PASRR Number: [id number]  Discharge Plan: Home    Current Diagnoses: Patient Active Problem List   Diagnosis Date Noted  . Femur fracture, right (HCC) 05/26/2017  . Hypokalemia 05/26/2017  . Severely underweight adult 01/02/2017  . Diarrhea   . Colitis   . Dehydration 11/18/2016  . AKI (acute kidney injury) (HCC) 11/18/2016  . Abdominal pain   . Intractable vomiting with nausea   . Renal insufficiency   . Insomnia 09/30/2016  . Memory difficulty 06/05/2016  . Abnormality of gait 12/21/2015  . Cytopenia 02/09/2015  . Protein-calorie malnutrition, severe (HCC) 01/07/2015  . Vomiting and diarrhea 01/05/2015  . Sleep walking 10/08/2014  . Restless legs 10/08/2014  . Essential hypertension 09/16/2014  . Chronic kidney disease, stage 3 09/16/2014  . Paroxysmal atrial tachycardia (HCC) 09/16/2014  . Hypothyroidism 09/16/2014  . Obstructive sleep apnea 07/18/2011    Orientation RESPIRATION BLADDER Height & Weight     Self, Time, Situation, Place  O2 (Nasal Cannula 2L) Continent Weight: 125 lb (56.7 kg) Height:  5\' 3"  (160 cm)  BEHAVIORAL SYMPTOMS/MOOD NEUROLOGICAL BOWEL NUTRITION STATUS      Continent Diet (See DC Summary)  AMBULATORY STATUS COMMUNICATION OF NEEDS Skin   Extensive Assist Verbally Normal, Other (Comment) (Right Hip Pain, Right Hip fracture)                        Personal Care Assistance Level of Assistance  Bathing, Feeding Bathing Assistance: Limited assistance Feeding assistance: Limited assistance Dressing Assistance: Limited assistance     Functional Limitations Info             SPECIAL CARE FACTORS FREQUENCY  PT (By licensed PT)     PT Frequency: 2xweek              Contractures      Additional Factors Info  Code Status, Psychotropic Code Status Info: Full Code   Psychotropic Info: clonopin, zoloft, trazadone         Current Medications (05/27/2017):  This is the current hospital active medication list Current Facility-Administered Medications  Medication Dose Route Frequency Provider Last Rate Last Dose  . acetaminophen (TYLENOL) tablet 1,000 mg  1,000 mg Oral Q6H PRN Pearson GrippeKim, James, MD   1,000 mg at 05/26/17 2056  . baclofen (LIORESAL) tablet 5 mg  5 mg Oral QHS PRN Pearson GrippeKim, James, MD      . calcitRIOL (ROCALTROL) capsule 0.25 mcg  0.25 mcg Oral Q M,W,F Pearson GrippeKim, James, MD   0.25 mcg at 05/26/17 0957  . clonazePAM (KLONOPIN) tablet 2 mg  2 mg Oral QHS Pearson GrippeKim, James, MD   2 mg at 05/26/17 2057  . clopidogrel (PLAVIX) tablet 75 mg  75 mg Oral Daily Pearson GrippeKim, James, MD   75 mg at 05/27/17 0919  . diltiazem (CARDIZEM CD) 24 hr capsule 120 mg  120 mg Oral  Daily Pearson Grippe, MD   120 mg at 05/27/17 0102  . diphenhydrAMINE (BENADRYL) capsule 25 mg  25 mg Oral Q4H PRN Pearson Grippe, MD      . diphenoxylate-atropine (LOMOTIL) 2.5-0.025 MG per tablet 1 tablet  1 tablet Oral TID PRN Pearson Grippe, MD      . feeding supplement (ENSURE ENLIVE) (ENSURE ENLIVE) liquid 237 mL  237 mL Oral TID BM Pearson Grippe, MD   237 mL at 05/27/17 0919  . heparin injection 5,000 Units  5,000 Units Subcutaneous Q8H Pearson Grippe, MD      . hydrALAZINE (APRESOLINE) injection 10 mg  10 mg Intravenous Q6H PRN Pearson Grippe, MD      . levothyroxine (SYNTHROID, LEVOTHROID) tablet 112 mcg  112 mcg Oral QAC breakfast Pearson Grippe, MD   112 mcg at 05/27/17 [phone]  . metoprolol  tartrate (LOPRESSOR) tablet 12.5 mg  12.5 mg Oral BID Pearson Grippe, MD   12.5 mg at 05/27/17 6644  . mometasone-formoterol (DULERA) 200-5 MCG/ACT inhaler 2 puff  2 puff Inhalation BID Pearson Grippe, MD   2 puff at 05/27/17 0906  . morphine 4 MG/ML injection 1 mg  1 mg Intravenous Q4H PRN Pearson Grippe, MD   1 mg at 05/27/17 1149  . omega-3 acid ethyl esters (LOVAZA) capsule 1 g  1 g Oral Daily Pearson Grippe, MD   1 g at 05/27/17 0919  . ondansetron (ZOFRAN) tablet 4 mg  4 mg Oral Q8H PRN Pearson Grippe, MD      . oxyCODONE (Oxy IR/ROXICODONE) immediate release tablet 5 mg  5 mg Oral Q4H PRN Pearson Grippe, MD   5 mg at 05/27/17 0347  . polyethylene glycol (MIRALAX / GLYCOLAX) packet 17 g  17 g Oral Daily PRN Pearson Grippe, MD      . senna-docusate (Senokot-S) tablet 1 tablet  1 tablet Oral BID Rai, Ripudeep K, MD      . sertraline (ZOLOFT) tablet 150 mg  150 mg Oral Daily Pearson Grippe, MD   150 mg at 05/27/17 4259  . sodium chloride flush (NS) 0.9 % injection 3 mL  3 mL Intravenous Q12H Pearson Grippe, MD   3 mL at 05/27/17 0919  . traZODone (DESYREL) tablet 100 mg  100 mg Oral QHS Pearson Grippe, MD   100 mg at 05/26/17 2056     Discharge Medications: Please see discharge summary for a list of discharge medications.  Relevant Imaging Results:  Relevant Lab Results:   Additional Information SS: [id number]   Tresa Moore, LCSW

## 2017-05-28 DIAGNOSIS — G4733 Obstructive sleep apnea (adult) (pediatric): Secondary | ICD-10-CM | POA: Diagnosis not present

## 2017-05-28 DIAGNOSIS — N183 Chronic kidney disease, stage 3 (moderate): Secondary | ICD-10-CM | POA: Diagnosis not present

## 2017-05-28 DIAGNOSIS — I10 Essential (primary) hypertension: Secondary | ICD-10-CM | POA: Diagnosis not present

## 2017-05-28 DIAGNOSIS — E039 Hypothyroidism, unspecified: Secondary | ICD-10-CM | POA: Diagnosis not present

## 2017-05-28 DIAGNOSIS — S79911A Unspecified injury of right hip, initial encounter: Secondary | ICD-10-CM | POA: Diagnosis not present

## 2017-05-28 DIAGNOSIS — E034 Atrophy of thyroid (acquired): Secondary | ICD-10-CM | POA: Diagnosis not present

## 2017-05-28 DIAGNOSIS — K219 Gastro-esophageal reflux disease without esophagitis: Secondary | ICD-10-CM | POA: Diagnosis not present

## 2017-05-28 DIAGNOSIS — Z96652 Presence of left artificial knee joint: Secondary | ICD-10-CM | POA: Diagnosis not present

## 2017-05-28 DIAGNOSIS — W19XXXD Unspecified fall, subsequent encounter: Secondary | ICD-10-CM | POA: Diagnosis not present

## 2017-05-28 DIAGNOSIS — R2681 Unsteadiness on feet: Secondary | ICD-10-CM | POA: Diagnosis not present

## 2017-05-28 DIAGNOSIS — S92153A Displaced avulsion fracture (chip fracture) of unspecified talus, initial encounter for closed fracture: Secondary | ICD-10-CM | POA: Diagnosis not present

## 2017-05-28 DIAGNOSIS — M79631 Pain in right forearm: Secondary | ICD-10-CM | POA: Diagnosis not present

## 2017-05-28 DIAGNOSIS — M79652 Pain in left thigh: Secondary | ICD-10-CM | POA: Diagnosis not present

## 2017-05-28 DIAGNOSIS — G8911 Acute pain due to trauma: Secondary | ICD-10-CM | POA: Diagnosis not present

## 2017-05-28 DIAGNOSIS — I471 Supraventricular tachycardia: Secondary | ICD-10-CM

## 2017-05-28 DIAGNOSIS — Z885 Allergy status to narcotic agent status: Secondary | ICD-10-CM | POA: Diagnosis not present

## 2017-05-28 DIAGNOSIS — Z7902 Long term (current) use of antithrombotics/antiplatelets: Secondary | ICD-10-CM | POA: Diagnosis not present

## 2017-05-28 DIAGNOSIS — M439 Deforming dorsopathy, unspecified: Secondary | ICD-10-CM | POA: Diagnosis not present

## 2017-05-28 DIAGNOSIS — K5900 Constipation, unspecified: Secondary | ICD-10-CM | POA: Diagnosis not present

## 2017-05-28 DIAGNOSIS — E876 Hypokalemia: Secondary | ICD-10-CM

## 2017-05-28 DIAGNOSIS — M79601 Pain in right arm: Secondary | ICD-10-CM | POA: Diagnosis not present

## 2017-05-28 DIAGNOSIS — S72111A Displaced fracture of greater trochanter of right femur, initial encounter for closed fracture: Secondary | ICD-10-CM | POA: Diagnosis not present

## 2017-05-28 DIAGNOSIS — M25551 Pain in right hip: Secondary | ICD-10-CM | POA: Diagnosis not present

## 2017-05-28 DIAGNOSIS — I129 Hypertensive chronic kidney disease with stage 1 through stage 4 chronic kidney disease, or unspecified chronic kidney disease: Secondary | ICD-10-CM | POA: Diagnosis not present

## 2017-05-28 DIAGNOSIS — I119 Hypertensive heart disease without heart failure: Secondary | ICD-10-CM | POA: Diagnosis not present

## 2017-05-28 DIAGNOSIS — I480 Paroxysmal atrial fibrillation: Secondary | ICD-10-CM | POA: Diagnosis not present

## 2017-05-28 DIAGNOSIS — R262 Difficulty in walking, not elsewhere classified: Secondary | ICD-10-CM | POA: Diagnosis not present

## 2017-05-28 DIAGNOSIS — S72101D Unspecified trochanteric fracture of right femur, subsequent encounter for closed fracture with routine healing: Secondary | ICD-10-CM | POA: Diagnosis not present

## 2017-05-28 DIAGNOSIS — S72114D Nondisplaced fracture of greater trochanter of right femur, subsequent encounter for closed fracture with routine healing: Secondary | ICD-10-CM | POA: Diagnosis not present

## 2017-05-28 DIAGNOSIS — M25521 Pain in right elbow: Secondary | ICD-10-CM | POA: Diagnosis not present

## 2017-05-28 DIAGNOSIS — M79641 Pain in right hand: Secondary | ICD-10-CM | POA: Diagnosis not present

## 2017-05-28 DIAGNOSIS — I4891 Unspecified atrial fibrillation: Secondary | ICD-10-CM | POA: Diagnosis not present

## 2017-05-28 LAB — CBC
HCT: 32.9 % — ABNORMAL LOW (ref 36.0–46.0)
Hemoglobin: 10.7 g/dL — ABNORMAL LOW (ref 12.0–15.0)
MCH: 30.5 pg (ref 26.0–34.0)
MCHC: 32.5 g/dL (ref 30.0–36.0)
MCV: 93.7 fL (ref 78.0–100.0)
Platelets: 90 K/uL — ABNORMAL LOW (ref 150–400)
RBC: 3.51 MIL/uL — ABNORMAL LOW (ref 3.87–5.11)
RDW: 12.8 % (ref 11.5–15.5)
WBC: 9.1 K/uL (ref 4.0–10.5)

## 2017-05-28 LAB — BASIC METABOLIC PANEL WITH GFR
Anion gap: 9 (ref 5–15)
BUN: 24 mg/dL — ABNORMAL HIGH (ref 6–20)
CO2: 27 mmol/L (ref 22–32)
Calcium: 9 mg/dL (ref 8.9–10.3)
Chloride: 100 mmol/L — ABNORMAL LOW (ref 101–111)
Creatinine, Ser: 1.39 mg/dL — ABNORMAL HIGH (ref 0.44–1.00)
GFR calc Af Amer: 42 mL/min — ABNORMAL LOW
GFR calc non Af Amer: 37 mL/min — ABNORMAL LOW
Glucose, Bld: 113 mg/dL — ABNORMAL HIGH (ref 65–99)
Potassium: 3.9 mmol/L (ref 3.5–5.1)
Sodium: 136 mmol/L (ref 135–145)

## 2017-05-28 MED ORDER — OXYCODONE HCL 5 MG PO TABS: 5.0000 mg | ORAL_TABLET | ORAL | 0 refills | Status: DC | PRN

## 2017-05-28 MED ORDER — CLONAZEPAM 2 MG PO TABS: 2.0000 mg | ORAL_TABLET | Freq: Every day | ORAL | 0 refills | Status: DC

## 2017-05-28 MED ORDER — BISACODYL 10 MG RE SUPP
10.0000 mg | Freq: Once | RECTAL | Status: AC
  Administered 2017-05-28: 10 mg via RECTAL
  Filled 2017-05-28: qty 1

## 2017-05-28 MED ORDER — SENNOSIDES-DOCUSATE SODIUM 8.6-50 MG PO TABS: 1.0000 | ORAL_TABLET | Freq: Two times a day (BID) | ORAL | 0 refills | Status: AC

## 2017-05-28 NOTE — Evaluation (Signed)
Occupational Therapy Evaluation Patient Details Name: [name] MRN: [id number] DOB: 03-Jul-1941 Today's Date: 05/28/2017    History of Present Illness [name] a 76 y.o.female,w Dementia, Pafib, OSA on cpap CKD stage3 apparently slipped on some magazines on the ground and fell. Pt found to have comminuted fracture of the greater trochanter of R femur. Ortho decided to treat non-operatively. R LE PWB at 50% for 6 weeks.    Clinical Impression   PTA, pt was living at [facility] and performing her ADLs and functional mobility with RW. Pt currently requiring Mod A for ADLs in sitting due to poor sitting balance, total A for LB ADLs, and Max A for bed mobility. Pt would benefit from acute OT to facilitate safe dc and optimize occupational performance. Recommend dc to SNF for further OT to increase her safety and independence with ADLs and functional mobility.     Follow Up Recommendations  SNF    Equipment Recommendations  Other (comment) (Defer to next venue)    Recommendations for Other Services PT consult     Precautions / Restrictions Precautions Precautions: Fall Precaution Comments: R hip fx and pain Restrictions Weight Bearing Restrictions: Yes RLE Weight Bearing: Touchdown weight bearing      Mobility Bed Mobility Overal bed mobility: Needs Assistance Bed Mobility: Supine to Sit;Sit to Supine;Rolling Rolling: Mod assist;+2 for physical assistance   Supine to sit: Max assist;HOB elevated Sit to supine: Max assist   General bed mobility comments: Pt requiring Max A to manage BLE during bed mobility. Pt able to pull up in bed with arms. Pt requiring Mod A +2 to roll towards R (affected) side  Transfers                 General transfer comment: Pt attempted sit<>stand to fix pad; pt unable with +1 A    Balance Overall balance assessment: Needs assistance Sitting-balance support: Feet supported;Bilateral upper extremity supported Sitting balance-Leahy  Scale: Poor Sitting balance - Comments: needs UE support to prevent leaning                                   ADL either performed or assessed with clinical judgement   ADL Overall ADL's : Needs assistance/impaired Eating/Feeding: Set up;Bed level   Grooming: Wash/dry face;Moderate assistance;Sitting Grooming Details (indicate cue type and reason): Pt with poor sitting balance at EOB due to pain at R side; tendency for posterior and lateral lean to L. Provided Mod A for sitting balance so pt would perform bilateral coordination during grooming.  Upper Body Bathing: Moderate assistance;Sitting   Lower Body Bathing: Total assistance;Bed level   Upper Body Dressing : Moderate assistance;Sitting   Lower Body Dressing: Total assistance;Bed level   Toilet Transfer:  (Bed level.)                   Vision Baseline Vision/History: Wears glasses Wears Glasses: At all times Patient Visual Report: No change from baseline;Other (comment) (Pt reports a recent change, but not since admission)       Perception     Praxis      Pertinent Vitals/Pain Pain Assessment: Faces Faces Pain Scale: Hurts whole lot Pain Location: R LE with movement Pain Descriptors / Indicators: Grimacing Pain Intervention(s): Limited activity within patient's tolerance;Repositioned     Hand Dominance Right   Extremity/Trunk Assessment Upper Extremity Assessment Upper Extremity Assessment: Overall WFL for tasks assessed   Lower  Extremity Assessment Lower Extremity Assessment: Defer to PT evaluation   Cervical / Trunk Assessment Cervical / Trunk Assessment: Kyphotic   Communication Communication Communication: No difficulties   Cognition Arousal/Alertness: Awake/alert Behavior During Therapy: WFL for tasks assessed/performed Overall Cognitive Status: History of cognitive impairments - at baseline                                     General Comments  when initiating  bed mobility, found three pills in bed. Notified RN and she came to observe pt take her medication    Exercises     Shoulder Instructions      Home Living Family/patient expects to be discharged to:: Skilled nursing facility Living Arrangements: Children                               Additional Comments: lived in ALFs      Prior Functioning/Environment Level of Independence: Independent with assistive device(s)        Comments: Pt performed ADLs and light IADLs. Facility provided meals. Used RW at all times with Modif I.  Walked entire facility per pt.         OT Problem List: Decreased strength;Decreased range of motion;Decreased activity tolerance;Impaired balance (sitting and/or standing);Decreased cognition;Decreased safety awareness;Decreased knowledge of use of DME or AE;Decreased knowledge of precautions;Pain      OT Treatment/Interventions:      OT Goals(Current goals can be found in the care plan section) Acute Rehab OT Goals Patient Stated Goal: stop the pain OT Goal Formulation: With patient Time For Goal Achievement: 06/11/17 Potential to Achieve Goals: Good  OT Frequency:     Barriers to D/C:            Co-evaluation              AM-PAC PT "6 Clicks" Daily Activity     Outcome Measure Help from another person eating meals?: None Help from another person taking care of personal grooming?: A Little Help from another person toileting, which includes using toliet, bedpan, or urinal?: Total Help from another person bathing (including washing, rinsing, drying)?: A Lot Help from another person to put on and taking off regular upper body clothing?: A Lot Help from another person to put on and taking off regular lower body clothing?: Total 6 Click Score: 13   End of Session Equipment Utilized During Treatment: Oxygen Nurse Communication: Mobility status;Other (comment) (Meds found in bed)  Activity Tolerance: Patient limited by  pain Patient left: in bed;with call bell/phone within reach  OT Visit Diagnosis: History of falling (Z91.81);Muscle weakness (generalized) (M62.81);Pain;Other symptoms and signs involving cognitive function Pain - Right/Left: Right Pain - part of body: Hip;Leg                Time: 1130-1203 OT Time Calculation (min): 33 min Charges:  OT General Charges $OT Visit: 1 Procedure OT Evaluation $OT Eval Moderate Complexity: 1 Procedure OT Treatments $Self Care/Home Management : 8-22 mins G-Codes: OT G-codes **NOT FOR INPATIENT CLASS** Functional Assessment Tool Used: Clinical judgement Functional Limitation: Self care Self Care Current Status (Z6109): At least 60 percent but less than 80 percent impaired, limited or restricted Self Care Goal Status (U0454): At least 20 percent but less than 40 percent impaired, limited or restricted   [employer] MSOT, OTR/L Acute Rehab Pager: [phone] Office: [phone]  Theodoro Grist Ozzie Knobel 05/28/2017, 12:41 PM

## 2017-05-28 NOTE — Social Work (Signed)
Clinical Social Worker facilitated patient discharge including contacting patient family and facility to confirm patient discharge plans.  Clinical information faxed to facility and family agreeable with plan.    CSW arranged ambulance transport via [facility] to [facility].    RN to call report [phone] to give prior to discharge. Pt going to Room 702.  Clinical Social Worker will sign off for now as social work intervention is no longer needed. Please consult us again if new need arises.  Keene BreathPatricia Cassy Sprowl, LCSW Clinical Social Worker [phone]

## 2017-05-28 NOTE — Clinical Social Work Placement (Addendum)
   CLINICAL SOCIAL WORK PLACEMENT  NOTE  Date:  05/28/2017  Patient Details  Name: [name] MRN: [id number] Date of Birth: 1941-01-03  Clinical Social Work is seeking post-discharge placement for this patient at the Skilled  Nursing Facility level of care (*CSW will initial, date and re-position this form in  chart as items are completed):  Yes   Patient/family provided with [place] Clinical Social Work Department's list of facilities offering this level of care within the geographic area requested by the patient (or if unable, by the patient's family).  Yes   Patient/family informed of their freedom to choose among providers that offer the needed level of care, that participate in Medicare, Medicaid or managed care program needed by the patient, have an available bed and are willing to accept the patient.  Yes   Patient/family informed of [facility]'s ownership interest in [facility] and [facility], as well as of the fact that they are under no obligation to receive care at these facilities.  PASRR submitted to EDS on       PASRR number received on   05/27/2017    Existing PASRR number confirmed on 05/27/17     FL2 transmitted to all facilities in geographic area requested by pt/family on 05/27/17     FL2 transmitted to all facilities within larger geographic area on 05/27/17     Patient informed that his/her managed care company has contracts with or will negotiate with certain facilities, including the following:        Yes   Patient/family informed of bed offers received.  [facility]     Physician recommends and patient chooses bed at      Patient to be transferred to [facility] on 05/28/17.  Patient to be transferred to facility by [facility]     Patient family notified on 05/28/17 of transfer.  Name of family member notified:  daughter, Elita Quickam     PHYSICIAN Please prepare prescriptions, Please prepare priority  discharge summary, including medications, Please sign FL2     Additional Comment:    _______________________________________________ Tresa MoorePatricia V Ilhan Madan, LCSW 05/28/2017, 12:41 PM

## 2017-05-28 NOTE — Social Work (Addendum)
CSW contacted daughter and presented her with SNF bed offers. Family has selected [facility]  to dc to for short term rehab.  CSW will f/u for dc summary and plan.

## 2017-05-28 NOTE — Discharge Summary (Signed)
Physician Discharge Summary  Rose FillersJudy C Raffo ZOX:[id number] DOB: 1941-07-08 DOA: 05/25/2017  PCP: Shelbie AmmonsHaque, Imran P, MD  Admit date: 05/25/2017 Discharge date: 05/28/2017  Time spent: 35 * minutes  Recommendations for Outpatient Follow-up:  1. Patient to be discharged to skilled facility, nonoperative management of right femur fracture.   Discharge Diagnoses:  Principal Problem:   Femur fracture, right (HCC) Active Problems:   Obstructive sleep apnea   Essential hypertension   Chronic kidney disease, stage 3   Paroxysmal atrial tachycardia (HCC)   Hypothyroidism   Hypokalemia   Discharge Condition: Stable  Diet recommendation: Regular diet  Filed Weights   05/25/17 2147 05/28/17 0506  Weight: 56.7 kg (125 lb) 57.1 kg (125 lb 14.4 oz)    History of present illness:  76 year old female with a history of dementia, paroxysmal atrial tachycardia/palpitations, OSA on CPAP, chronic kidney disease, stage III apparently slipped on some magazines on the ground and fell. Patient was found to have comminuted fracture of the greater trochanter of the right femur.  Hospital Course:   Right femur fracture Due to mechanical fall. CT of the right hip showed comminuted fracture involving the greater trochanter of right femur with slight separation of fracture fragments. No dislocation. Orthopedics was consulted and recommended nonoperative management. Patient will discharge to skilled facility  Obstructive sleep apnea Continue CPAP daily at bedtime  Essential hypertension Blood pressure stable, continue metoprolol.  Chronic kidney disease stage III Patient is currently at baseline, creatinine stable at 1.39  Paroxysmal atrial fibrillation Heart is controlled, continue metoprolol, Cardizem Continue Plavix  Hypothyroidism TSH 1.2, continue Synthroid.  Hypokalemia Resolved.  Low-grade fever No clear etiology. UA negative for UTI. No leukocytosis. No symptoms. X-ray abdomen showed  no ileus or SBO, showed stool burden. Patient on Senokot S 1 tablet by mouth twice a day for constipation.   Procedures:  None  Consultations:  Orthopedics  Discharge Exam: Vitals:   05/27/17 2301 05/28/17 0458  BP:  (!) 129/57  Pulse: 88 82  Resp: 16 18  Temp:  98.2 F (36.8 C)    General: Appears in no acute distress Cardiovascular: RRR, S1-S2 Respiratory: Clear to auscultation bilaterally  Discharge Instructions   Discharge Instructions    Diet - low sodium heart healthy    Complete by:  As directed    Increase activity slowly    Complete by:  As directed      Current Discharge Medication List    START taking these medications   Details  senna-docusate (SENOKOT-S) 8.6-50 MG tablet Take 1 tablet by mouth 2 (two) times daily. Qty: 10 tablet, Refills: 0      CONTINUE these medications which have CHANGED   Details  clonazePAM (KLONOPIN) 2 MG tablet Take 1 tablet (2 mg total) by mouth at bedtime. Qty: 5 tablet, Refills: 0    oxyCODONE (OXY IR/ROXICODONE) 5 MG immediate release tablet Take 1 tablet (5 mg total) by mouth every 4 (four) hours as needed for breakthrough pain. Qty: 30 tablet, Refills: 0      CONTINUE these medications which have NOT CHANGED   Details  acetaminophen (TYLENOL) 500 MG tablet Take 1,000 mg by mouth every 6 (six) hours as needed for headache (minor discomfort (contact MD if headache or pain persists for more than 24 hours).    baclofen (LIORESAL) 10 MG tablet Take 5 mg by mouth at bedtime as needed (back spasms).     budesonide-formoterol (SYMBICORT) 160-4.5 MCG/ACT inhaler Inhale 2 puffs into the lungs 2 (two)  times daily. Rinse mouth after use - discard 3 months after opening    calcitRIOL (ROCALTROL) 0.25 MCG capsule Take 0.25 mcg by mouth every Monday, Wednesday, and Friday. Do not crush    clopidogrel (PLAVIX) 75 MG tablet Take 1 tablet (75 mg total) by mouth daily. Qty: 30 tablet, Refills: 5    diltiazem (CARDIZEM CD) 120  MG 24 hr capsule Take 120 mg by mouth daily. Refills: 3    diltiazem (CARDIZEM) 30 MG tablet Take 30 mg by mouth at bedtime as needed (palpitations or tachycardia). Do not crush    diphenhydrAMINE (BENADRYL) 25 MG tablet Take 25 mg by mouth every 4 (four) hours as needed for itching (minor allergic reaction).    diphenoxylate-atropine (LOMOTIL) 2.5-0.025 MG tablet Take 1 tablet by mouth 3 (three) times daily as needed for diarrhea or loose stools. Qty: 30 tablet, Refills: 0    feeding supplement, ENSURE ENLIVE, (ENSURE ENLIVE) LIQD Take 237 mLs by mouth 3 (three) times daily between meals. Qty: 237 mL, Refills: 12    levothyroxine (SYNTHROID, LEVOTHROID) 112 MCG tablet Take 112 mcg by mouth daily.     metoprolol tartrate (LOPRESSOR) 25 MG tablet Take 12.5 mg by mouth 2 (two) times daily.     Omega-3 Fatty Acids (FISH OIL) 1000 MG CAPS Take 1,000 mg by mouth daily. Do not crush    ondansetron (ZOFRAN) 4 MG tablet Take 4 mg by mouth every 8 (eight) hours as needed for nausea or vomiting.    polyethylene glycol (MIRALAX / GLYCOLAX) packet Take 17 g by mouth daily as needed (constipation). Mix in 8 oz of water and drink    sertraline (ZOLOFT) 100 MG tablet Take 150 mg by mouth daily.    traZODone (DESYREL) 100 MG tablet Take 1 tablet (100 mg total) by mouth at bedtime. Qty: 30 tablet, Refills: 12    memantine (NAMENDA TITRATION PAK) tablet pack 5 mg/day for =1 week; 5 mg twice daily for =1 week; 15 mg/day given in 5 mg and 10 mg separated doses for =1 week; then 10 mg twice daily Qty: 49 tablet, Refills: 0      STOP taking these medications     traMADol (ULTRAM) 50 MG tablet        Allergies  Allergen Reactions  . Vicodin [Hydrocodone-Acetaminophen] Nausea And Vomiting   Contact information for after-discharge care    Destination    [facility] .   Specialty:  Skilled Nursing Facility Contact information: [address] [place]  [address] [phone]               The results of significant diagnostics from this hospitalization (including imaging, microbiology, ancillary and laboratory) are listed below for reference.    Significant Diagnostic Studies: Ct Hip Right Wo Contrast  Result Date: 05/26/2017 CLINICAL DATA:  Fall with right hip pain EXAM: CT OF THE RIGHT HIP WITHOUT CONTRAST TECHNIQUE: Multidetector CT imaging of the right hip was performed according to the standard protocol. Multiplanar CT image reconstructions were also generated. COMPARISON:  Radiograph 05/25/2017, CT 11/18/2016 FINDINGS: Bones/Joint/Cartilage Comminuted fracture involving the greater trochanter of the right femur with mild separation of the fracture fragments. No definite extension of fracture lucency to the lesser trochanter. No femoral head dislocation. Pubic symphysis and rami are intact. No acetabular fracture is seen. The SI joint on the right is within normal limits. Ligaments Suboptimally assessed by CT. Muscles and Tendons Normal muscle bulk about the right hip. Soft tissues Edema  within the soft tissues posterior and lateral to the right hip. IMPRESSION: Comminuted fracture involving the greater trochanter of the right femur with slight separation of fracture fragments. No dislocation. Electronically Signed   By: Jasmine Pang M.D.   On: 05/26/2017 02:47   Dg Knee Complete 4 Views Right  Result Date: 05/26/2017 CLINICAL DATA:  Diffuse right knee pain. Fall 2 days ago. Initial encounter. EXAM: RIGHT KNEE - COMPLETE 4+ VIEW COMPARISON:  None. FINDINGS: No evidence of fracture, dislocation, or joint effusion. No evidence of arthropathy or other focal bone abnormality. Soft tissues are unremarkable. Osteopenia. IMPRESSION: Negative. Electronically Signed   By: Marnee Spring M.D.   On: 05/26/2017 11:18   Dg Abd 2 Views  Result Date: 05/27/2017 CLINICAL DATA:  Abdominal pain.  Nausea and vomiting for 2 days. EXAM: ABDOMEN - 2 VIEW  COMPARISON:  Abdominal radiograph 11/20/2016.  CT 11/18/2016 FINDINGS: No free intra-abdominal air. No dilated bowel loops to suggest obstruction. Small volume of stool throughout the colon. No radiopaque calculi. Scoliosis of the lumbar spine. Bones are under mineralized. Trochanteric fracture of the right hip on CT is not well seen radiographically. IMPRESSION: Nonobstructive bowel gas pattern.  Small stool burden. Electronically Signed   By: Rubye Oaks M.D.   On: 05/27/2017 18:19   Dg Hip Unilat  With Pelvis 2-3 Views Right  Result Date: 05/25/2017 CLINICAL DATA:  76 year old female with fall and right hip pain. EXAM: DG HIP (WITH OR WITHOUT PELVIS) 2-3V RIGHT COMPARISON:  None. FINDINGS: No acute fracture or dislocation. The bones are osteopenic. Moderate osteoarthritic changes of the hips. The soft tissues appear unremarkable. IMPRESSION: 1. No acute fracture or dislocation. 2. Osteopenia with moderate osteoarthritis of the hips. Electronically Signed   By: Elgie Collard M.D.   On: 05/25/2017 23:17    Microbiology: No results found for this or any previous visit (from the past 240 hour(s)).   Labs: Basic Metabolic Panel:  Recent Labs Lab 05/26/17 0324 05/26/17 0840 05/26/17 1024 05/27/17 0425 05/28/17 0520  NA 142  --   --  137 136  K 2.7*  --  4.2 3.9 3.9  CL 102  --   --  100* 100*  CO2 30  --   --  28 27  GLUCOSE 83  --   --  106* 113*  BUN 19  --   --  19 24*  CREATININE 1.47*  --   --  1.37* 1.39*  CALCIUM 9.1  --   --  9.0 9.0  MG  --  1.8  --   --   --    Liver Function Tests:  Recent Labs Lab 05/26/17 0324  AST 24  ALT 26  ALKPHOS 60  BILITOT 0.8  PROT 7.1  ALBUMIN 3.7   No results for input(s): LIPASE, AMYLASE in the last 168 hours. No results for input(s): AMMONIA in the last 168 hours. CBC:  Recent Labs Lab 05/26/17 0324 05/27/17 0425 05/28/17 0520  WBC 9.6 8.7 9.1  NEUTROABS 7.9*  --   --   HGB 12.0 10.7* 10.7*  HCT 37.2 33.0* 32.9*  MCV  95.6 94.6 93.7  PLT 112* 86* 90*       Signed:  Mycal Conde S MD.  [facility] 05/28/2017, 11:55 AM

## 2017-05-28 NOTE — Progress Notes (Signed)
[facility]. All questions/cocnerns addressed. IV removed and belonging gathered. [facility] to transport

## 2017-06-03 DIAGNOSIS — R262 Difficulty in walking, not elsewhere classified: Secondary | ICD-10-CM | POA: Diagnosis not present

## 2017-06-03 DIAGNOSIS — S72101D Unspecified trochanteric fracture of right femur, subsequent encounter for closed fracture with routine healing: Secondary | ICD-10-CM | POA: Diagnosis not present

## 2017-06-03 DIAGNOSIS — I119 Hypertensive heart disease without heart failure: Secondary | ICD-10-CM | POA: Diagnosis not present

## 2017-06-03 DIAGNOSIS — M439 Deforming dorsopathy, unspecified: Secondary | ICD-10-CM | POA: Diagnosis not present

## 2017-06-11 DIAGNOSIS — S72114D Nondisplaced fracture of greater trochanter of right femur, subsequent encounter for closed fracture with routine healing: Secondary | ICD-10-CM | POA: Diagnosis not present

## 2017-06-11 DIAGNOSIS — M25521 Pain in right elbow: Secondary | ICD-10-CM | POA: Diagnosis not present

## 2017-06-17 DIAGNOSIS — N183 Chronic kidney disease, stage 3 (moderate): Secondary | ICD-10-CM | POA: Diagnosis not present

## 2017-06-17 DIAGNOSIS — Z79891 Long term (current) use of opiate analgesic: Secondary | ICD-10-CM | POA: Diagnosis not present

## 2017-06-17 DIAGNOSIS — S72114D Nondisplaced fracture of greater trochanter of right femur, subsequent encounter for closed fracture with routine healing: Secondary | ICD-10-CM | POA: Diagnosis not present

## 2017-06-17 DIAGNOSIS — I129 Hypertensive chronic kidney disease with stage 1 through stage 4 chronic kidney disease, or unspecified chronic kidney disease: Secondary | ICD-10-CM | POA: Diagnosis not present

## 2017-06-17 DIAGNOSIS — Z79899 Other long term (current) drug therapy: Secondary | ICD-10-CM | POA: Diagnosis not present

## 2017-06-17 DIAGNOSIS — Z96652 Presence of left artificial knee joint: Secondary | ICD-10-CM | POA: Diagnosis not present

## 2017-06-17 DIAGNOSIS — Z7951 Long term (current) use of inhaled steroids: Secondary | ICD-10-CM | POA: Diagnosis not present

## 2017-06-17 DIAGNOSIS — G4733 Obstructive sleep apnea (adult) (pediatric): Secondary | ICD-10-CM | POA: Diagnosis not present

## 2017-06-17 DIAGNOSIS — I480 Paroxysmal atrial fibrillation: Secondary | ICD-10-CM | POA: Diagnosis not present

## 2017-06-17 DIAGNOSIS — W19XXXD Unspecified fall, subsequent encounter: Secondary | ICD-10-CM | POA: Diagnosis not present

## 2017-06-17 DIAGNOSIS — K5909 Other constipation: Secondary | ICD-10-CM | POA: Diagnosis not present

## 2017-06-17 DIAGNOSIS — Z7902 Long term (current) use of antithrombotics/antiplatelets: Secondary | ICD-10-CM | POA: Diagnosis not present

## 2017-06-18 DIAGNOSIS — G4733 Obstructive sleep apnea (adult) (pediatric): Secondary | ICD-10-CM | POA: Diagnosis not present

## 2017-06-18 DIAGNOSIS — Z8781 Personal history of (healed) traumatic fracture: Secondary | ICD-10-CM

## 2017-06-18 DIAGNOSIS — Z79891 Long term (current) use of opiate analgesic: Secondary | ICD-10-CM | POA: Diagnosis not present

## 2017-06-18 DIAGNOSIS — Z7951 Long term (current) use of inhaled steroids: Secondary | ICD-10-CM | POA: Diagnosis not present

## 2017-06-18 DIAGNOSIS — Z7902 Long term (current) use of antithrombotics/antiplatelets: Secondary | ICD-10-CM | POA: Diagnosis not present

## 2017-06-18 DIAGNOSIS — K5909 Other constipation: Secondary | ICD-10-CM | POA: Diagnosis not present

## 2017-06-18 DIAGNOSIS — N183 Chronic kidney disease, stage 3 (moderate): Secondary | ICD-10-CM | POA: Diagnosis not present

## 2017-06-18 DIAGNOSIS — Z96652 Presence of left artificial knee joint: Secondary | ICD-10-CM | POA: Diagnosis not present

## 2017-06-18 DIAGNOSIS — W19XXXD Unspecified fall, subsequent encounter: Secondary | ICD-10-CM | POA: Diagnosis not present

## 2017-06-18 DIAGNOSIS — Z79899 Other long term (current) drug therapy: Secondary | ICD-10-CM | POA: Diagnosis not present

## 2017-06-18 DIAGNOSIS — S72114D Nondisplaced fracture of greater trochanter of right femur, subsequent encounter for closed fracture with routine healing: Secondary | ICD-10-CM | POA: Diagnosis not present

## 2017-06-18 DIAGNOSIS — I480 Paroxysmal atrial fibrillation: Secondary | ICD-10-CM | POA: Diagnosis not present

## 2017-06-18 DIAGNOSIS — I129 Hypertensive chronic kidney disease with stage 1 through stage 4 chronic kidney disease, or unspecified chronic kidney disease: Secondary | ICD-10-CM | POA: Diagnosis not present

## 2017-06-18 HISTORY — DX: Personal history of (healed) traumatic fracture: Z87.81

## 2017-06-20 DIAGNOSIS — Z79891 Long term (current) use of opiate analgesic: Secondary | ICD-10-CM | POA: Diagnosis not present

## 2017-06-20 DIAGNOSIS — Z7951 Long term (current) use of inhaled steroids: Secondary | ICD-10-CM | POA: Diagnosis not present

## 2017-06-20 DIAGNOSIS — G4733 Obstructive sleep apnea (adult) (pediatric): Secondary | ICD-10-CM | POA: Diagnosis not present

## 2017-06-20 DIAGNOSIS — W19XXXD Unspecified fall, subsequent encounter: Secondary | ICD-10-CM | POA: Diagnosis not present

## 2017-06-20 DIAGNOSIS — I129 Hypertensive chronic kidney disease with stage 1 through stage 4 chronic kidney disease, or unspecified chronic kidney disease: Secondary | ICD-10-CM | POA: Diagnosis not present

## 2017-06-20 DIAGNOSIS — I480 Paroxysmal atrial fibrillation: Secondary | ICD-10-CM | POA: Diagnosis not present

## 2017-06-20 DIAGNOSIS — K5909 Other constipation: Secondary | ICD-10-CM | POA: Diagnosis not present

## 2017-06-20 DIAGNOSIS — Z96652 Presence of left artificial knee joint: Secondary | ICD-10-CM | POA: Diagnosis not present

## 2017-06-20 DIAGNOSIS — Z7902 Long term (current) use of antithrombotics/antiplatelets: Secondary | ICD-10-CM | POA: Diagnosis not present

## 2017-06-20 DIAGNOSIS — S72114D Nondisplaced fracture of greater trochanter of right femur, subsequent encounter for closed fracture with routine healing: Secondary | ICD-10-CM | POA: Diagnosis not present

## 2017-06-20 DIAGNOSIS — N183 Chronic kidney disease, stage 3 (moderate): Secondary | ICD-10-CM | POA: Diagnosis not present

## 2017-06-20 DIAGNOSIS — Z79899 Other long term (current) drug therapy: Secondary | ICD-10-CM | POA: Diagnosis not present

## 2017-06-24 DIAGNOSIS — W19XXXD Unspecified fall, subsequent encounter: Secondary | ICD-10-CM | POA: Diagnosis not present

## 2017-06-24 DIAGNOSIS — Z79891 Long term (current) use of opiate analgesic: Secondary | ICD-10-CM | POA: Diagnosis not present

## 2017-06-24 DIAGNOSIS — Z79899 Other long term (current) drug therapy: Secondary | ICD-10-CM | POA: Diagnosis not present

## 2017-06-24 DIAGNOSIS — I480 Paroxysmal atrial fibrillation: Secondary | ICD-10-CM | POA: Diagnosis not present

## 2017-06-24 DIAGNOSIS — K5909 Other constipation: Secondary | ICD-10-CM | POA: Diagnosis not present

## 2017-06-24 DIAGNOSIS — G4733 Obstructive sleep apnea (adult) (pediatric): Secondary | ICD-10-CM | POA: Diagnosis not present

## 2017-06-24 DIAGNOSIS — I129 Hypertensive chronic kidney disease with stage 1 through stage 4 chronic kidney disease, or unspecified chronic kidney disease: Secondary | ICD-10-CM | POA: Diagnosis not present

## 2017-06-24 DIAGNOSIS — S72114D Nondisplaced fracture of greater trochanter of right femur, subsequent encounter for closed fracture with routine healing: Secondary | ICD-10-CM | POA: Diagnosis not present

## 2017-06-24 DIAGNOSIS — Z7902 Long term (current) use of antithrombotics/antiplatelets: Secondary | ICD-10-CM | POA: Diagnosis not present

## 2017-06-24 DIAGNOSIS — Z96652 Presence of left artificial knee joint: Secondary | ICD-10-CM | POA: Diagnosis not present

## 2017-06-24 DIAGNOSIS — N183 Chronic kidney disease, stage 3 (moderate): Secondary | ICD-10-CM | POA: Diagnosis not present

## 2017-06-24 DIAGNOSIS — Z7951 Long term (current) use of inhaled steroids: Secondary | ICD-10-CM | POA: Diagnosis not present

## 2017-06-25 DIAGNOSIS — Z96652 Presence of left artificial knee joint: Secondary | ICD-10-CM | POA: Diagnosis not present

## 2017-06-25 DIAGNOSIS — Z7902 Long term (current) use of antithrombotics/antiplatelets: Secondary | ICD-10-CM | POA: Diagnosis not present

## 2017-06-25 DIAGNOSIS — K5909 Other constipation: Secondary | ICD-10-CM | POA: Diagnosis not present

## 2017-06-25 DIAGNOSIS — S72114D Nondisplaced fracture of greater trochanter of right femur, subsequent encounter for closed fracture with routine healing: Secondary | ICD-10-CM | POA: Diagnosis not present

## 2017-06-25 DIAGNOSIS — Z79899 Other long term (current) drug therapy: Secondary | ICD-10-CM | POA: Diagnosis not present

## 2017-06-25 DIAGNOSIS — I129 Hypertensive chronic kidney disease with stage 1 through stage 4 chronic kidney disease, or unspecified chronic kidney disease: Secondary | ICD-10-CM | POA: Diagnosis not present

## 2017-06-25 DIAGNOSIS — G4733 Obstructive sleep apnea (adult) (pediatric): Secondary | ICD-10-CM | POA: Diagnosis not present

## 2017-06-25 DIAGNOSIS — Z79891 Long term (current) use of opiate analgesic: Secondary | ICD-10-CM | POA: Diagnosis not present

## 2017-06-25 DIAGNOSIS — N183 Chronic kidney disease, stage 3 (moderate): Secondary | ICD-10-CM | POA: Diagnosis not present

## 2017-06-25 DIAGNOSIS — I480 Paroxysmal atrial fibrillation: Secondary | ICD-10-CM | POA: Diagnosis not present

## 2017-06-25 DIAGNOSIS — W19XXXD Unspecified fall, subsequent encounter: Secondary | ICD-10-CM | POA: Diagnosis not present

## 2017-06-25 DIAGNOSIS — Z7951 Long term (current) use of inhaled steroids: Secondary | ICD-10-CM | POA: Diagnosis not present

## 2017-06-30 DIAGNOSIS — I129 Hypertensive chronic kidney disease with stage 1 through stage 4 chronic kidney disease, or unspecified chronic kidney disease: Secondary | ICD-10-CM | POA: Diagnosis not present

## 2017-06-30 DIAGNOSIS — W19XXXD Unspecified fall, subsequent encounter: Secondary | ICD-10-CM | POA: Diagnosis not present

## 2017-06-30 DIAGNOSIS — Z7902 Long term (current) use of antithrombotics/antiplatelets: Secondary | ICD-10-CM | POA: Diagnosis not present

## 2017-06-30 DIAGNOSIS — N183 Chronic kidney disease, stage 3 (moderate): Secondary | ICD-10-CM | POA: Diagnosis not present

## 2017-06-30 DIAGNOSIS — S72114D Nondisplaced fracture of greater trochanter of right femur, subsequent encounter for closed fracture with routine healing: Secondary | ICD-10-CM | POA: Diagnosis not present

## 2017-06-30 DIAGNOSIS — I480 Paroxysmal atrial fibrillation: Secondary | ICD-10-CM | POA: Diagnosis not present

## 2017-06-30 DIAGNOSIS — Z79899 Other long term (current) drug therapy: Secondary | ICD-10-CM | POA: Diagnosis not present

## 2017-06-30 DIAGNOSIS — K5909 Other constipation: Secondary | ICD-10-CM | POA: Diagnosis not present

## 2017-06-30 DIAGNOSIS — Z7951 Long term (current) use of inhaled steroids: Secondary | ICD-10-CM | POA: Diagnosis not present

## 2017-06-30 DIAGNOSIS — G4733 Obstructive sleep apnea (adult) (pediatric): Secondary | ICD-10-CM | POA: Diagnosis not present

## 2017-06-30 DIAGNOSIS — Z96652 Presence of left artificial knee joint: Secondary | ICD-10-CM | POA: Diagnosis not present

## 2017-06-30 DIAGNOSIS — Z79891 Long term (current) use of opiate analgesic: Secondary | ICD-10-CM | POA: Diagnosis not present

## 2017-07-01 DIAGNOSIS — G4733 Obstructive sleep apnea (adult) (pediatric): Secondary | ICD-10-CM | POA: Diagnosis not present

## 2017-07-01 DIAGNOSIS — Z79899 Other long term (current) drug therapy: Secondary | ICD-10-CM | POA: Diagnosis not present

## 2017-07-01 DIAGNOSIS — K5909 Other constipation: Secondary | ICD-10-CM | POA: Diagnosis not present

## 2017-07-01 DIAGNOSIS — Z7951 Long term (current) use of inhaled steroids: Secondary | ICD-10-CM | POA: Diagnosis not present

## 2017-07-01 DIAGNOSIS — W19XXXD Unspecified fall, subsequent encounter: Secondary | ICD-10-CM | POA: Diagnosis not present

## 2017-07-01 DIAGNOSIS — N183 Chronic kidney disease, stage 3 (moderate): Secondary | ICD-10-CM | POA: Diagnosis not present

## 2017-07-01 DIAGNOSIS — I129 Hypertensive chronic kidney disease with stage 1 through stage 4 chronic kidney disease, or unspecified chronic kidney disease: Secondary | ICD-10-CM | POA: Diagnosis not present

## 2017-07-01 DIAGNOSIS — Z79891 Long term (current) use of opiate analgesic: Secondary | ICD-10-CM | POA: Diagnosis not present

## 2017-07-01 DIAGNOSIS — Z7902 Long term (current) use of antithrombotics/antiplatelets: Secondary | ICD-10-CM | POA: Diagnosis not present

## 2017-07-01 DIAGNOSIS — I480 Paroxysmal atrial fibrillation: Secondary | ICD-10-CM | POA: Diagnosis not present

## 2017-07-01 DIAGNOSIS — Z96652 Presence of left artificial knee joint: Secondary | ICD-10-CM | POA: Diagnosis not present

## 2017-07-01 DIAGNOSIS — S72114D Nondisplaced fracture of greater trochanter of right femur, subsequent encounter for closed fracture with routine healing: Secondary | ICD-10-CM | POA: Diagnosis not present

## 2017-07-02 DIAGNOSIS — G4733 Obstructive sleep apnea (adult) (pediatric): Secondary | ICD-10-CM | POA: Diagnosis not present

## 2017-07-03 DIAGNOSIS — G4733 Obstructive sleep apnea (adult) (pediatric): Secondary | ICD-10-CM | POA: Diagnosis not present

## 2017-07-03 DIAGNOSIS — N183 Chronic kidney disease, stage 3 (moderate): Secondary | ICD-10-CM | POA: Diagnosis not present

## 2017-07-03 DIAGNOSIS — W19XXXD Unspecified fall, subsequent encounter: Secondary | ICD-10-CM | POA: Diagnosis not present

## 2017-07-03 DIAGNOSIS — Z79891 Long term (current) use of opiate analgesic: Secondary | ICD-10-CM | POA: Diagnosis not present

## 2017-07-03 DIAGNOSIS — Z96652 Presence of left artificial knee joint: Secondary | ICD-10-CM | POA: Diagnosis not present

## 2017-07-03 DIAGNOSIS — Z7951 Long term (current) use of inhaled steroids: Secondary | ICD-10-CM | POA: Diagnosis not present

## 2017-07-03 DIAGNOSIS — K5909 Other constipation: Secondary | ICD-10-CM | POA: Diagnosis not present

## 2017-07-03 DIAGNOSIS — Z7902 Long term (current) use of antithrombotics/antiplatelets: Secondary | ICD-10-CM | POA: Diagnosis not present

## 2017-07-03 DIAGNOSIS — I480 Paroxysmal atrial fibrillation: Secondary | ICD-10-CM | POA: Diagnosis not present

## 2017-07-03 DIAGNOSIS — I129 Hypertensive chronic kidney disease with stage 1 through stage 4 chronic kidney disease, or unspecified chronic kidney disease: Secondary | ICD-10-CM | POA: Diagnosis not present

## 2017-07-03 DIAGNOSIS — Z79899 Other long term (current) drug therapy: Secondary | ICD-10-CM | POA: Diagnosis not present

## 2017-07-03 DIAGNOSIS — S72114D Nondisplaced fracture of greater trochanter of right femur, subsequent encounter for closed fracture with routine healing: Secondary | ICD-10-CM | POA: Diagnosis not present

## 2017-07-08 DIAGNOSIS — Z96652 Presence of left artificial knee joint: Secondary | ICD-10-CM | POA: Diagnosis not present

## 2017-07-08 DIAGNOSIS — I129 Hypertensive chronic kidney disease with stage 1 through stage 4 chronic kidney disease, or unspecified chronic kidney disease: Secondary | ICD-10-CM | POA: Diagnosis not present

## 2017-07-08 DIAGNOSIS — N183 Chronic kidney disease, stage 3 (moderate): Secondary | ICD-10-CM | POA: Diagnosis not present

## 2017-07-08 DIAGNOSIS — Z7951 Long term (current) use of inhaled steroids: Secondary | ICD-10-CM | POA: Diagnosis not present

## 2017-07-08 DIAGNOSIS — W19XXXD Unspecified fall, subsequent encounter: Secondary | ICD-10-CM | POA: Diagnosis not present

## 2017-07-08 DIAGNOSIS — I480 Paroxysmal atrial fibrillation: Secondary | ICD-10-CM | POA: Diagnosis not present

## 2017-07-08 DIAGNOSIS — S72114D Nondisplaced fracture of greater trochanter of right femur, subsequent encounter for closed fracture with routine healing: Secondary | ICD-10-CM | POA: Diagnosis not present

## 2017-07-08 DIAGNOSIS — K5909 Other constipation: Secondary | ICD-10-CM | POA: Diagnosis not present

## 2017-07-08 DIAGNOSIS — Z79899 Other long term (current) drug therapy: Secondary | ICD-10-CM | POA: Diagnosis not present

## 2017-07-08 DIAGNOSIS — G4733 Obstructive sleep apnea (adult) (pediatric): Secondary | ICD-10-CM | POA: Diagnosis not present

## 2017-07-08 DIAGNOSIS — Z7902 Long term (current) use of antithrombotics/antiplatelets: Secondary | ICD-10-CM | POA: Diagnosis not present

## 2017-07-08 DIAGNOSIS — Z79891 Long term (current) use of opiate analgesic: Secondary | ICD-10-CM | POA: Diagnosis not present

## 2017-07-09 DIAGNOSIS — S72114D Nondisplaced fracture of greater trochanter of right femur, subsequent encounter for closed fracture with routine healing: Secondary | ICD-10-CM | POA: Diagnosis not present

## 2017-07-10 DIAGNOSIS — I129 Hypertensive chronic kidney disease with stage 1 through stage 4 chronic kidney disease, or unspecified chronic kidney disease: Secondary | ICD-10-CM | POA: Diagnosis not present

## 2017-07-10 DIAGNOSIS — Z96652 Presence of left artificial knee joint: Secondary | ICD-10-CM | POA: Diagnosis not present

## 2017-07-10 DIAGNOSIS — Z79899 Other long term (current) drug therapy: Secondary | ICD-10-CM | POA: Diagnosis not present

## 2017-07-10 DIAGNOSIS — Z79891 Long term (current) use of opiate analgesic: Secondary | ICD-10-CM | POA: Diagnosis not present

## 2017-07-10 DIAGNOSIS — N183 Chronic kidney disease, stage 3 (moderate): Secondary | ICD-10-CM | POA: Diagnosis not present

## 2017-07-10 DIAGNOSIS — S72114D Nondisplaced fracture of greater trochanter of right femur, subsequent encounter for closed fracture with routine healing: Secondary | ICD-10-CM | POA: Diagnosis not present

## 2017-07-10 DIAGNOSIS — Z7902 Long term (current) use of antithrombotics/antiplatelets: Secondary | ICD-10-CM | POA: Diagnosis not present

## 2017-07-10 DIAGNOSIS — W19XXXD Unspecified fall, subsequent encounter: Secondary | ICD-10-CM | POA: Diagnosis not present

## 2017-07-10 DIAGNOSIS — I480 Paroxysmal atrial fibrillation: Secondary | ICD-10-CM | POA: Diagnosis not present

## 2017-07-10 DIAGNOSIS — K5909 Other constipation: Secondary | ICD-10-CM | POA: Diagnosis not present

## 2017-07-10 DIAGNOSIS — Z7951 Long term (current) use of inhaled steroids: Secondary | ICD-10-CM | POA: Diagnosis not present

## 2017-07-10 DIAGNOSIS — G4733 Obstructive sleep apnea (adult) (pediatric): Secondary | ICD-10-CM | POA: Diagnosis not present

## 2017-07-11 DIAGNOSIS — Z79899 Other long term (current) drug therapy: Secondary | ICD-10-CM | POA: Diagnosis not present

## 2017-07-11 DIAGNOSIS — I129 Hypertensive chronic kidney disease with stage 1 through stage 4 chronic kidney disease, or unspecified chronic kidney disease: Secondary | ICD-10-CM | POA: Diagnosis not present

## 2017-07-11 DIAGNOSIS — Z79891 Long term (current) use of opiate analgesic: Secondary | ICD-10-CM | POA: Diagnosis not present

## 2017-07-11 DIAGNOSIS — N183 Chronic kidney disease, stage 3 (moderate): Secondary | ICD-10-CM | POA: Diagnosis not present

## 2017-07-11 DIAGNOSIS — K5909 Other constipation: Secondary | ICD-10-CM | POA: Diagnosis not present

## 2017-07-11 DIAGNOSIS — Z7951 Long term (current) use of inhaled steroids: Secondary | ICD-10-CM | POA: Diagnosis not present

## 2017-07-11 DIAGNOSIS — S72114D Nondisplaced fracture of greater trochanter of right femur, subsequent encounter for closed fracture with routine healing: Secondary | ICD-10-CM | POA: Diagnosis not present

## 2017-07-11 DIAGNOSIS — G4733 Obstructive sleep apnea (adult) (pediatric): Secondary | ICD-10-CM | POA: Diagnosis not present

## 2017-07-11 DIAGNOSIS — I480 Paroxysmal atrial fibrillation: Secondary | ICD-10-CM | POA: Diagnosis not present

## 2017-07-11 DIAGNOSIS — Z96652 Presence of left artificial knee joint: Secondary | ICD-10-CM | POA: Diagnosis not present

## 2017-07-11 DIAGNOSIS — Z7902 Long term (current) use of antithrombotics/antiplatelets: Secondary | ICD-10-CM | POA: Diagnosis not present

## 2017-07-11 DIAGNOSIS — W19XXXD Unspecified fall, subsequent encounter: Secondary | ICD-10-CM | POA: Diagnosis not present

## 2017-07-14 ENCOUNTER — Encounter: Payer: Self-pay | Admitting: Internal Medicine

## 2017-07-14 ENCOUNTER — Encounter (INDEPENDENT_AMBULATORY_CARE_PROVIDER_SITE_OTHER): Payer: Self-pay

## 2017-07-14 ENCOUNTER — Ambulatory Visit (INDEPENDENT_AMBULATORY_CARE_PROVIDER_SITE_OTHER): Payer: Medicare Other | Admitting: Internal Medicine

## 2017-07-14 VITALS — BP 122/68 | HR 58 | Ht 63.0 in | Wt 107.0 lb

## 2017-07-14 DIAGNOSIS — K5903 Drug induced constipation: Secondary | ICD-10-CM

## 2017-07-14 DIAGNOSIS — Z7951 Long term (current) use of inhaled steroids: Secondary | ICD-10-CM | POA: Diagnosis not present

## 2017-07-14 DIAGNOSIS — G4733 Obstructive sleep apnea (adult) (pediatric): Secondary | ICD-10-CM | POA: Diagnosis not present

## 2017-07-14 DIAGNOSIS — I129 Hypertensive chronic kidney disease with stage 1 through stage 4 chronic kidney disease, or unspecified chronic kidney disease: Secondary | ICD-10-CM | POA: Diagnosis not present

## 2017-07-14 DIAGNOSIS — T402X5A Adverse effect of other opioids, initial encounter: Secondary | ICD-10-CM | POA: Diagnosis not present

## 2017-07-14 DIAGNOSIS — W19XXXD Unspecified fall, subsequent encounter: Secondary | ICD-10-CM | POA: Diagnosis not present

## 2017-07-14 DIAGNOSIS — Z7902 Long term (current) use of antithrombotics/antiplatelets: Secondary | ICD-10-CM | POA: Diagnosis not present

## 2017-07-14 DIAGNOSIS — Z79899 Other long term (current) drug therapy: Secondary | ICD-10-CM | POA: Diagnosis not present

## 2017-07-14 DIAGNOSIS — N183 Chronic kidney disease, stage 3 (moderate): Secondary | ICD-10-CM | POA: Diagnosis not present

## 2017-07-14 DIAGNOSIS — Z96652 Presence of left artificial knee joint: Secondary | ICD-10-CM | POA: Diagnosis not present

## 2017-07-14 DIAGNOSIS — Z79891 Long term (current) use of opiate analgesic: Secondary | ICD-10-CM | POA: Diagnosis not present

## 2017-07-14 DIAGNOSIS — I480 Paroxysmal atrial fibrillation: Secondary | ICD-10-CM | POA: Diagnosis not present

## 2017-07-14 DIAGNOSIS — K5909 Other constipation: Secondary | ICD-10-CM | POA: Diagnosis not present

## 2017-07-14 DIAGNOSIS — S72114D Nondisplaced fracture of greater trochanter of right femur, subsequent encounter for closed fracture with routine healing: Secondary | ICD-10-CM | POA: Diagnosis not present

## 2017-07-14 NOTE — Progress Notes (Signed)
[name] 76 y.o. 12/01/1940 [id number]  Assessment & Plan:   Encounter Diagnosis  Name Primary?  . Constipation due to opioid therapy Yes    His problems began when she started oxycodone after hip fracture a month ago. She is not impacted. Start Amitiza 24 g twice a day and continue daily MiraLAX. May use Senokot as needed on top of this. If this fails to work she'll let me know. Hopefully when she comes off narcotics she may taper this regimen.  CC: Shelbie Ammons, MD    Subjective:   Chief Complaint:Constipation  HPI This patient is a very nice 76 year old white woman here with a caregiver I saw in the late Winter early spring with diarrhea. He stopped Aricept and the diarrhea got better. A month ago she fractured her right hip, and had to take oxycodone as developed constipation that is not responding well to MiraLAX and Senokot. She is moving her bowels some days but is incomplete and she has to strain.   Allergies  Allergen Reactions  . Vicodin [Hydrocodone-Acetaminophen] Nausea And Vomiting   Current Meds  Medication Sig  . acetaminophen (TYLENOL) 500 MG tablet Take 1,000 mg by mouth every 6 (six) hours as needed for headache (minor discomfort (contact MD if headache or pain persists for more than 24 hours).  . baclofen (LIORESAL) 10 MG tablet Take 5 mg by mouth at bedtime as needed (back spasms).   . budesonide-formoterol (SYMBICORT) 160-4.5 MCG/ACT inhaler Inhale 2 puffs into the lungs 2 (two) times daily. Rinse mouth after use - discard 3 months after opening  . calcitRIOL (ROCALTROL) 0.25 MCG capsule Take 0.25 mcg by mouth every Monday, Wednesday, and Friday. Do not crush  . calcium-vitamin D (OSCAL WITH D) 250-125 MG-UNIT tablet Take 1 tablet by mouth 2 (two) times daily.  . clonazePAM (KLONOPIN) 2 MG tablet Take 1 tablet (2 mg total) by mouth at bedtime.  . clopidogrel (PLAVIX) 75 MG tablet Take 1 tablet (75 mg total) by mouth daily.  [name] diltiazem  (CARDIZEM CD) 120 MG 24 hr capsule Take 120 mg by mouth daily.  [name] diltiazem (CARDIZEM) 30 MG tablet Take 30 mg by mouth at bedtime as needed (palpitations or tachycardia). Do not crush  . diphenhydrAMINE (BENADRYL) 25 MG tablet Take 25 mg by mouth every 4 (four) hours as needed for itching (minor allergic reaction).  . diphenoxylate-atropine (LOMOTIL) 2.5-0.025 MG tablet Take 1 tablet by mouth 3 (three) times daily as needed for diarrhea or loose stools.  . feeding supplement, ENSURE ENLIVE, (ENSURE ENLIVE) LIQD Take 237 mLs by mouth 3 (three) times daily between meals.  [name] levothyroxine (SYNTHROID, LEVOTHROID) 112 MCG tablet Take 112 mcg by mouth daily.   . memantine (NAMENDA TITRATION PAK) tablet pack 5 mg/day for =1 week; 5 mg twice daily for =1 week; 15 mg/day given in 5 mg and 10 mg separated doses for =1 week; then 10 mg twice daily  . metoprolol tartrate (LOPRESSOR) 25 MG tablet Take 12.5 mg by mouth 2 (two) times daily.   . Omega-3 Fatty Acids (FISH OIL) 1000 MG CAPS Take 1,000 mg by mouth daily. Do not crush  . ondansetron (ZOFRAN) 4 MG tablet Take 4 mg by mouth every 8 (eight) hours as needed for nausea or vomiting.  [name] oxyCODONE (OXY IR/ROXICODONE) 5 MG immediate release tablet Take 1 tablet (5 mg total) by mouth every 4 (four) hours as needed for breakthrough pain.  . polyethylene glycol (MIRALAX / GLYCOLAX) packet Take  17 g by mouth daily as needed (constipation). Mix in 8 oz of water and drink  . senna-docusate (SENOKOT-S) 8.6-50 MG tablet Take 1 tablet by mouth 2 (two) times daily.  . sertraline (ZOLOFT) 100 MG tablet Take 150 mg by mouth daily.  . traZODone (DESYREL) 100 MG tablet Take 1 tablet (100 mg total) by mouth at bedtime.   Past Medical History:  Diagnosis Date  . Abnormal heart rhythm   . Abnormality of gait 12/21/2015  . Afib (HCC)    h/o  . Anxiety   . Arthritis    "hips, shoulders" (01/05/2015)  . Back pain   . Chronic kidney disease (CKD), stage III (moderate)     [name] 01/05/2015  . Dementia   . Depression   . Echocardiogram abnormal 02/26/11   MVP,mild MR,AOV mildly sclerotic. EF >55%  . GERD (gastroesophageal reflux disease)   . Hypertension   . Hypothyroidism   . Memory difficulty 06/05/2016  . Nausea & vomiting 11/2016  . Osteoporosis   . Osteoporosis   . Reactive airway disease   . S/P right hip fracture 06/2017   No surgery required  . Scoliosis   . Sleep apnea    "suppose to wear a mask; can't tolerate it" (01/05/2015)   Past Surgical History:  Procedure Laterality Date  . CATARACT EXTRACTION, BILATERAL Bilateral   . CESAREAN SECTION  1967  . CESAREAN SECTION  1971  . DILATION AND CURETTAGE OF UTERUS    . FLEXIBLE SIGMOIDOSCOPY N/A 11/24/2016   Procedure: FLEXIBLE SIGMOIDOSCOPY;  Surgeon: Iva Boop, MD;  Location: [facility] ENDOSCOPY;  Service: Endoscopy;  Laterality: N/A;  . JOINT REPLACEMENT    . OPEN REDUCTION INTERNAL FIXATION (ORIF) DISTAL RADIAL FRACTURE Left 12/2009   [name] 12/28/2009  . TOTAL KNEE ARTHROPLASTY Left 03/2010   [name] 04/07/2010  . TUBAL LIGATION    . VAGINAL HYSTERECTOMY  1980    Review of Systems As above she has right hip pain and decreased range of motion and walking ability  Objective:   Physical Exam Vitals:   07/14/17 1141  BP: 122/68  Pulse: (!) 58   [place] LPN present for exam Elderly white woman looking slightly frail. Able to get on the exam table but requires assistance.  Rectal exam reveals a normal anoderm. Digital exam reveals some formed brown stool without mass or tenderness and there is normal resting tone and there is no rectocele. No fecal impaction.  15 minutes time spent with patient > half in counseling coordination of care

## 2017-07-14 NOTE — Patient Instructions (Addendum)
  We are sending orders for your constipation treatment. Miralax, Amitiza and Senokot. See order sheet.   I appreciate the opportunity to care for you. Stan Head, MD, [facility]

## 2017-07-15 ENCOUNTER — Ambulatory Visit (INDEPENDENT_AMBULATORY_CARE_PROVIDER_SITE_OTHER): Payer: Medicare Other | Admitting: Adult Health

## 2017-07-15 ENCOUNTER — Encounter: Payer: Self-pay | Admitting: Adult Health

## 2017-07-15 VITALS — BP 119/74 | HR 78 | Ht 63.0 in | Wt 109.8 lb

## 2017-07-15 DIAGNOSIS — R413 Other amnesia: Secondary | ICD-10-CM

## 2017-07-15 NOTE — Progress Notes (Signed)
PATIENT: [name] DOB: 07-30-1941  REASON FOR VISIT: follow up- memory HISTORY FROM: patient  HISTORY OF PRESENT ILLNESS: [name] is a 76 year old female with a history of memory disturbance. She returns today for follow-up. She states overall she feels that her memory has stayed relatively the same. She currently lives at a skilled nursing facility. She is able to complete most ADLs independently however since she fractured her right hip she is requiring more assistance. She will not undergo surgery. She is using hydrocodone to control her pain. She reports decreased appetite. Denies any significant changes in her mood or behavior. Denies any changes with her sleep. She is currently on namenda on tolerating it well.  She returns today for an evaluation.  HISTORY 01/14/17: [name] is a 76 year old female with a history of chronic gait disorder and memory disturbance. She returns today for follow-up. She states that she has significant weight loss within the last 6 months and chronic diarrhea. She saw Dr. Leone Payor and all of his tests have been unremarkable. He  advised that the patient stop Aricept for several days to see if the diarrhea subsides. The patient felt that her memory was better on Aricept. She continues to live in an assisted living facility. She is able to complete all ADLs independently. Reports that she does not operate a motor vehicle. She states in her home she is able to prepare meals without difficulty although she eats most meals at the facility. She states that she sleeps well most nights although she does have some restless nights. Denies hallucinations. Reports that she is using Plavix. She states that she has had 2 falls within the last 2 weeks. She is unsure what caused the falls. She states that she does use a walker however symptoms in her home she will use her furniture. Fortunately she did not suffer any injuries. She returns today for evaluation.  REVIEW  OF SYSTEMS: Out of a complete 14 system review of symptoms, the patient complains only of the following symptoms, and all other reviewed systems are negative.  See HPI  ALLERGIES: Allergies  Allergen Reactions  . Vicodin [Hydrocodone-Acetaminophen] Nausea And Vomiting    HOME MEDICATIONS: Outpatient Medications Prior to Visit  Medication Sig Dispense Refill  . acetaminophen (TYLENOL) 500 MG tablet Take 1,000 mg by mouth every 6 (six) hours as needed for headache (minor discomfort (contact MD if headache or pain persists for more than 24 hours).    . baclofen (LIORESAL) 10 MG tablet Take 5 mg by mouth at bedtime as needed (back spasms).     . budesonide-formoterol (SYMBICORT) 160-4.5 MCG/ACT inhaler Inhale 2 puffs into the lungs 2 (two) times daily. Rinse mouth after use - discard 3 months after opening    . calcitRIOL (ROCALTROL) 0.25 MCG capsule Take 0.25 mcg by mouth every Monday, Wednesday, and Friday. Do not crush    . calcium-vitamin D (OSCAL WITH D) 250-125 MG-UNIT tablet Take 1 tablet by mouth 2 (two) times daily.    . clonazePAM (KLONOPIN) 2 MG tablet Take 1 tablet (2 mg total) by mouth at bedtime. 5 tablet 0  . clopidogrel (PLAVIX) 75 MG tablet Take 1 tablet (75 mg total) by mouth daily. 30 tablet 5  . diltiazem (CARDIZEM CD) 120 MG 24 hr capsule Take 120 mg by mouth daily.  3  . diltiazem (CARDIZEM) 30 MG tablet Take 30 mg by mouth at bedtime as needed (palpitations or tachycardia). Do not crush    .  diphenhydrAMINE (BENADRYL) 25 MG tablet Take 25 mg by mouth every 4 (four) hours as needed for itching (minor allergic reaction).    . diphenoxylate-atropine (LOMOTIL) 2.5-0.025 MG tablet Take 1 tablet by mouth 3 (three) times daily as needed for diarrhea or loose stools. 30 tablet 0  . feeding supplement, ENSURE ENLIVE, (ENSURE ENLIVE) LIQD Take 237 mLs by mouth 3 (three) times daily between meals. 237 mL 12  . levothyroxine (SYNTHROID, LEVOTHROID) 112 MCG tablet Take 112 mcg by mouth  daily.     . metoprolol tartrate (LOPRESSOR) 25 MG tablet Take 12.5 mg by mouth 2 (two) times daily.     . Omega-3 Fatty Acids (FISH OIL) 1000 MG CAPS Take 1,000 mg by mouth daily. Do not crush    . ondansetron (ZOFRAN) 4 MG tablet Take 4 mg by mouth every 8 (eight) hours as needed for nausea or vomiting.    [name] oxyCODONE (OXY IR/ROXICODONE) 5 MG immediate release tablet Take 1 tablet (5 mg total) by mouth every 4 (four) hours as needed for breakthrough pain. 30 tablet 0  . polyethylene glycol (MIRALAX / GLYCOLAX) packet Take 17 g by mouth daily as needed (constipation). Mix in 8 oz of water and drink    . senna-docusate (SENOKOT-S) 8.6-50 MG tablet Take 1 tablet by mouth 2 (two) times daily. 10 tablet 0  . sertraline (ZOLOFT) 100 MG tablet Take 150 mg by mouth daily.    . traZODone (DESYREL) 100 MG tablet Take 1 tablet (100 mg total) by mouth at bedtime. 30 tablet 12  . memantine (NAMENDA TITRATION PAK) tablet pack 5 mg/day for =1 week; 5 mg twice daily for =1 week; 15 mg/day given in 5 mg and 10 mg separated doses for =1 week; then 10 mg twice daily (Patient not taking: Reported on 07/15/2017) 49 tablet 0   No facility-administered medications prior to visit.     PAST MEDICAL HISTORY: Past Medical History:  Diagnosis Date  . Abnormal heart rhythm   . Abnormality of gait 12/21/2015  . Afib (HCC)    h/o  . Anxiety   . Arthritis    "hips, shoulders" (01/05/2015)  . Back pain   . Chronic kidney disease (CKD), stage III (moderate)    [name] 01/05/2015  . Dementia   . Depression   . Echocardiogram abnormal 02/26/11   MVP,mild MR,AOV mildly sclerotic. EF >55%  . GERD (gastroesophageal reflux disease)   . Hypertension   . Hypothyroidism   . Memory difficulty 06/05/2016  . Nausea & vomiting 11/2016  . Osteoporosis   . Osteoporosis   . Reactive airway disease   . S/P right hip fracture 06/2017   No surgery required  . Scoliosis   . Sleep apnea    "suppose to wear a mask; can't tolerate it"  (01/05/2015)    PAST SURGICAL HISTORY: Past Surgical History:  Procedure Laterality Date  . CATARACT EXTRACTION, BILATERAL Bilateral   . CESAREAN SECTION  1967  . CESAREAN SECTION  1971  . DILATION AND CURETTAGE OF UTERUS    . FLEXIBLE SIGMOIDOSCOPY N/A 11/24/2016   Procedure: FLEXIBLE SIGMOIDOSCOPY;  Surgeon: Iva Boop, MD;  [facility] ENDOSCOPY;  Service: Endoscopy;  Laterality: N/A;  . JOINT REPLACEMENT    . OPEN REDUCTION INTERNAL FIXATION (ORIF) DISTAL RADIAL FRACTURE Left 12/2009   [name] 12/28/2009  . TOTAL KNEE ARTHROPLASTY Left 03/2010   [name] 04/07/2010  . TUBAL LIGATION    . VAGINAL HYSTERECTOMY  1980    FAMILY HISTORY: Family  History  Problem Relation Age of Onset  . Cancer Mother   . Heart disease Father   . Cancer Father     SOCIAL HISTORY: Social History   Social History  . Marital status: Divorced    Spouse name: N/A  . Number of children: 2  . Years of education: 12   Occupational History  . retired    Social History Main Topics  . Smoking status: Never Smoker  . Smokeless tobacco: Never Used  . Alcohol use No  . Drug use: No  . Sexual activity: No   Other Topics Concern  . Not on file   Social History Narrative   Patient drinks caffeine twice a month.   Patient is right handed.       PHYSICAL EXAM  Vitals:   07/15/17 1323  BP: 119/74  Pulse: 78  Weight: 109 lb 12.8 oz (49.8 kg)  Height: 5\' 3"  (1.6 m)   Body mass index is 19.45 kg/m. MMSE - Mini Mental State Exam 07/15/2017 01/14/2017  Orientation to time 5 3  Orientation to Place 2 5  Registration 3 3  Attention/ Calculation 1 5  Recall 2 1  Language- name 2 objects 2 2  Language- repeat 1 1  Language- follow 3 step command 3 2  Language- read & follow direction 1 1  Write a sentence 1 1  Copy design 1 1  Total score 22 25    Generalized: Well developed, in no acute distress   Neurological examination  Mentation: Alert oriented to time, place, history taking.  Follows all commands speech and language fluent Cranial nerve II-XII: Pupils were equal round reactive to light. Extraocular movements were full, visual field were full on confrontational test. Facial sensation and strength were normal. Uvula tongue midline. Head turning and shoulder shrug  were normal and symmetric. Motor: The motor testing reveals 5 over 5 strength of all 4 extremities. Good symmetric motor tone is noted throughout.  Sensory: Sensory testing is intact to soft touch on all 4 extremities. No evidence of extinction is noted.  Coordination: Cerebellar testing reveals good finger-nose-finger and heel-to-shin bilaterally.  Gait and station: Gait is normal. Tandem gait is normal. Romberg is negative. No drift is seen.  Reflexes: Deep tendon reflexes are symmetric and normal bilaterally.   DIAGNOSTIC DATA (LABS, IMAGING, TESTING) - I reviewed patient records, labs, notes, testing and imaging myself where available.  Lab Results  Component Value Date   WBC 9.1 05/28/2017   HGB 10.7 (L) 05/28/2017   HCT 32.9 (L) 05/28/2017   MCV 93.7 05/28/2017   PLT 90 (L) 05/28/2017      Component Value Date/Time   NA 136 05/28/2017 0520   K 3.9 05/28/2017 0520   CL 100 (L) 05/28/2017 0520   CO2 27 05/28/2017 0520   GLUCOSE 113 (H) 05/28/2017 0520   BUN 24 (H) 05/28/2017 0520   CREATININE 1.39 (H) 05/28/2017 0520   CREATININE 2.28 (H) 09/15/2014 1224   CALCIUM 9.0 05/28/2017 0520   PROT 7.1 05/26/2017 0324   ALBUMIN 3.7 05/26/2017 0324   AST 24 05/26/2017 0324   ALT 26 05/26/2017 0324   ALKPHOS 60 05/26/2017 0324   BILITOT 0.8 05/26/2017 0324   GFRNONAA 37 (L) 05/28/2017 0520   GFRAA 42 (L) 05/28/2017 0520   Lab Results  Component Value Date   CHOL 196 09/15/2014   HDL 54 09/15/2014   LDLCALC 102 (H) 09/15/2014   TRIG 202 (H) 09/15/2014   CHOLHDL 3.6  09/15/2014   No results found for: HGBA1C Lab Results  Component Value Date   VITAMINB12 366 12/21/2015   Lab Results    Component Value Date   TSH 1.254 05/27/2017      ASSESSMENT AND PLAN 76 y.o. year old female  has a past medical history of Abnormal heart rhythm; Abnormality of gait (12/21/2015); Afib (HCC); Anxiety; Arthritis; Back pain; Chronic kidney disease (CKD), stage III (moderate); Dementia; Depression; Echocardiogram abnormal (02/26/11); GERD (gastroesophageal reflux disease); Hypertension; Hypothyroidism; Memory difficulty (06/05/2016); Nausea & vomiting (11/2016); Osteoporosis; Osteoporosis; Reactive airway disease; S/P right hip fracture (06/2017); Scoliosis; and Sleep apnea. here with:  1. Memory Disturbance   Patient's memory score has declined slightly. She will continue on Namenda 10 mg BID. She has tried Aricept in the past but unable to tolerate d/t GI side effects. We will continue to monitor memory. Certainly pain medication can have an affect on memory as well. She is advised that if her symptoms worsen or she develops new symptoms she should let us know. She will follow-up in 6 months or sooner if needed.   I spent 15 minutes with the patient. 50% of this time was spent reviewing memory score      Butch Penny, MSN, NP-C 07/15/2017, 1:46 PM [facility] [address] [place], [place] [address] [phone]

## 2017-07-15 NOTE — Patient Instructions (Signed)
Your Plan:  Continue Namenda 10 mg twice a day Memory score slightly decreased. We continue to monitor. If your symptoms worsen or you develop new symptoms please let us know.    Thank you for coming to see [place] at [facility]. I hope we have been able to provide you high quality care today.  You may receive a patient satisfaction survey over the next few weeks. We would appreciate your feedback and comments so that we may continue to improve ourselves and the health of our patients.

## 2017-07-16 DIAGNOSIS — Z79891 Long term (current) use of opiate analgesic: Secondary | ICD-10-CM | POA: Diagnosis not present

## 2017-07-16 DIAGNOSIS — I480 Paroxysmal atrial fibrillation: Secondary | ICD-10-CM | POA: Diagnosis not present

## 2017-07-16 DIAGNOSIS — W19XXXD Unspecified fall, subsequent encounter: Secondary | ICD-10-CM | POA: Diagnosis not present

## 2017-07-16 DIAGNOSIS — G4733 Obstructive sleep apnea (adult) (pediatric): Secondary | ICD-10-CM | POA: Diagnosis not present

## 2017-07-16 DIAGNOSIS — Z96652 Presence of left artificial knee joint: Secondary | ICD-10-CM | POA: Diagnosis not present

## 2017-07-16 DIAGNOSIS — S72114D Nondisplaced fracture of greater trochanter of right femur, subsequent encounter for closed fracture with routine healing: Secondary | ICD-10-CM | POA: Diagnosis not present

## 2017-07-16 DIAGNOSIS — Z7951 Long term (current) use of inhaled steroids: Secondary | ICD-10-CM | POA: Diagnosis not present

## 2017-07-16 DIAGNOSIS — I129 Hypertensive chronic kidney disease with stage 1 through stage 4 chronic kidney disease, or unspecified chronic kidney disease: Secondary | ICD-10-CM | POA: Diagnosis not present

## 2017-07-16 DIAGNOSIS — N183 Chronic kidney disease, stage 3 (moderate): Secondary | ICD-10-CM | POA: Diagnosis not present

## 2017-07-16 DIAGNOSIS — Z79899 Other long term (current) drug therapy: Secondary | ICD-10-CM | POA: Diagnosis not present

## 2017-07-16 DIAGNOSIS — K5909 Other constipation: Secondary | ICD-10-CM | POA: Diagnosis not present

## 2017-07-16 DIAGNOSIS — Z7902 Long term (current) use of antithrombotics/antiplatelets: Secondary | ICD-10-CM | POA: Diagnosis not present

## 2017-07-18 DIAGNOSIS — N183 Chronic kidney disease, stage 3 (moderate): Secondary | ICD-10-CM | POA: Diagnosis not present

## 2017-07-18 DIAGNOSIS — S72114D Nondisplaced fracture of greater trochanter of right femur, subsequent encounter for closed fracture with routine healing: Secondary | ICD-10-CM | POA: Diagnosis not present

## 2017-07-18 DIAGNOSIS — I480 Paroxysmal atrial fibrillation: Secondary | ICD-10-CM | POA: Diagnosis not present

## 2017-07-18 DIAGNOSIS — I129 Hypertensive chronic kidney disease with stage 1 through stage 4 chronic kidney disease, or unspecified chronic kidney disease: Secondary | ICD-10-CM | POA: Diagnosis not present

## 2017-07-18 DIAGNOSIS — W19XXXD Unspecified fall, subsequent encounter: Secondary | ICD-10-CM | POA: Diagnosis not present

## 2017-07-18 DIAGNOSIS — Z79899 Other long term (current) drug therapy: Secondary | ICD-10-CM | POA: Diagnosis not present

## 2017-07-18 DIAGNOSIS — Z79891 Long term (current) use of opiate analgesic: Secondary | ICD-10-CM | POA: Diagnosis not present

## 2017-07-18 DIAGNOSIS — G4733 Obstructive sleep apnea (adult) (pediatric): Secondary | ICD-10-CM | POA: Diagnosis not present

## 2017-07-18 DIAGNOSIS — K5909 Other constipation: Secondary | ICD-10-CM | POA: Diagnosis not present

## 2017-07-18 DIAGNOSIS — Z96652 Presence of left artificial knee joint: Secondary | ICD-10-CM | POA: Diagnosis not present

## 2017-07-18 DIAGNOSIS — Z7951 Long term (current) use of inhaled steroids: Secondary | ICD-10-CM | POA: Diagnosis not present

## 2017-07-18 DIAGNOSIS — Z7902 Long term (current) use of antithrombotics/antiplatelets: Secondary | ICD-10-CM | POA: Diagnosis not present

## 2017-07-21 DIAGNOSIS — I480 Paroxysmal atrial fibrillation: Secondary | ICD-10-CM | POA: Diagnosis not present

## 2017-07-21 DIAGNOSIS — G4733 Obstructive sleep apnea (adult) (pediatric): Secondary | ICD-10-CM | POA: Diagnosis not present

## 2017-07-21 DIAGNOSIS — Z7902 Long term (current) use of antithrombotics/antiplatelets: Secondary | ICD-10-CM | POA: Diagnosis not present

## 2017-07-21 DIAGNOSIS — Z96652 Presence of left artificial knee joint: Secondary | ICD-10-CM | POA: Diagnosis not present

## 2017-07-21 DIAGNOSIS — Z79899 Other long term (current) drug therapy: Secondary | ICD-10-CM | POA: Diagnosis not present

## 2017-07-21 DIAGNOSIS — S72114D Nondisplaced fracture of greater trochanter of right femur, subsequent encounter for closed fracture with routine healing: Secondary | ICD-10-CM | POA: Diagnosis not present

## 2017-07-21 DIAGNOSIS — Z7951 Long term (current) use of inhaled steroids: Secondary | ICD-10-CM | POA: Diagnosis not present

## 2017-07-21 DIAGNOSIS — I129 Hypertensive chronic kidney disease with stage 1 through stage 4 chronic kidney disease, or unspecified chronic kidney disease: Secondary | ICD-10-CM | POA: Diagnosis not present

## 2017-07-21 DIAGNOSIS — N183 Chronic kidney disease, stage 3 (moderate): Secondary | ICD-10-CM | POA: Diagnosis not present

## 2017-07-21 DIAGNOSIS — W19XXXD Unspecified fall, subsequent encounter: Secondary | ICD-10-CM | POA: Diagnosis not present

## 2017-07-21 DIAGNOSIS — Z79891 Long term (current) use of opiate analgesic: Secondary | ICD-10-CM | POA: Diagnosis not present

## 2017-07-21 DIAGNOSIS — K5909 Other constipation: Secondary | ICD-10-CM | POA: Diagnosis not present

## 2017-07-22 DIAGNOSIS — W19XXXD Unspecified fall, subsequent encounter: Secondary | ICD-10-CM | POA: Diagnosis not present

## 2017-07-22 DIAGNOSIS — Z7951 Long term (current) use of inhaled steroids: Secondary | ICD-10-CM | POA: Diagnosis not present

## 2017-07-22 DIAGNOSIS — Z79899 Other long term (current) drug therapy: Secondary | ICD-10-CM | POA: Diagnosis not present

## 2017-07-22 DIAGNOSIS — N183 Chronic kidney disease, stage 3 (moderate): Secondary | ICD-10-CM | POA: Diagnosis not present

## 2017-07-22 DIAGNOSIS — Z79891 Long term (current) use of opiate analgesic: Secondary | ICD-10-CM | POA: Diagnosis not present

## 2017-07-22 DIAGNOSIS — I480 Paroxysmal atrial fibrillation: Secondary | ICD-10-CM | POA: Diagnosis not present

## 2017-07-22 DIAGNOSIS — I129 Hypertensive chronic kidney disease with stage 1 through stage 4 chronic kidney disease, or unspecified chronic kidney disease: Secondary | ICD-10-CM | POA: Diagnosis not present

## 2017-07-22 DIAGNOSIS — Z7902 Long term (current) use of antithrombotics/antiplatelets: Secondary | ICD-10-CM | POA: Diagnosis not present

## 2017-07-22 DIAGNOSIS — S72114D Nondisplaced fracture of greater trochanter of right femur, subsequent encounter for closed fracture with routine healing: Secondary | ICD-10-CM | POA: Diagnosis not present

## 2017-07-22 DIAGNOSIS — Z96652 Presence of left artificial knee joint: Secondary | ICD-10-CM | POA: Diagnosis not present

## 2017-07-22 DIAGNOSIS — K5909 Other constipation: Secondary | ICD-10-CM | POA: Diagnosis not present

## 2017-07-22 DIAGNOSIS — G4733 Obstructive sleep apnea (adult) (pediatric): Secondary | ICD-10-CM | POA: Diagnosis not present

## 2017-07-22 NOTE — Progress Notes (Signed)
I reviewed note and agree with plan.   Suanne MarkerVIKRAM R. Jo-Ann Johanning, MD 07/22/2017, 5:34 PM Certified in Neurology, Neurophysiology and Neuroimaging  [facility] [address], [address] [place], [place] [address] [phone]

## 2017-07-23 DIAGNOSIS — I129 Hypertensive chronic kidney disease with stage 1 through stage 4 chronic kidney disease, or unspecified chronic kidney disease: Secondary | ICD-10-CM | POA: Diagnosis not present

## 2017-07-23 DIAGNOSIS — I480 Paroxysmal atrial fibrillation: Secondary | ICD-10-CM | POA: Diagnosis not present

## 2017-07-23 DIAGNOSIS — W19XXXD Unspecified fall, subsequent encounter: Secondary | ICD-10-CM | POA: Diagnosis not present

## 2017-07-23 DIAGNOSIS — N183 Chronic kidney disease, stage 3 (moderate): Secondary | ICD-10-CM | POA: Diagnosis not present

## 2017-07-23 DIAGNOSIS — Z96652 Presence of left artificial knee joint: Secondary | ICD-10-CM | POA: Diagnosis not present

## 2017-07-23 DIAGNOSIS — Z7951 Long term (current) use of inhaled steroids: Secondary | ICD-10-CM | POA: Diagnosis not present

## 2017-07-23 DIAGNOSIS — Z79899 Other long term (current) drug therapy: Secondary | ICD-10-CM | POA: Diagnosis not present

## 2017-07-23 DIAGNOSIS — G4733 Obstructive sleep apnea (adult) (pediatric): Secondary | ICD-10-CM | POA: Diagnosis not present

## 2017-07-23 DIAGNOSIS — K5909 Other constipation: Secondary | ICD-10-CM | POA: Diagnosis not present

## 2017-07-23 DIAGNOSIS — S72114D Nondisplaced fracture of greater trochanter of right femur, subsequent encounter for closed fracture with routine healing: Secondary | ICD-10-CM | POA: Diagnosis not present

## 2017-07-23 DIAGNOSIS — Z79891 Long term (current) use of opiate analgesic: Secondary | ICD-10-CM | POA: Diagnosis not present

## 2017-07-23 DIAGNOSIS — Z7902 Long term (current) use of antithrombotics/antiplatelets: Secondary | ICD-10-CM | POA: Diagnosis not present

## 2017-07-24 DIAGNOSIS — Z96652 Presence of left artificial knee joint: Secondary | ICD-10-CM | POA: Diagnosis not present

## 2017-07-24 DIAGNOSIS — Z79899 Other long term (current) drug therapy: Secondary | ICD-10-CM | POA: Diagnosis not present

## 2017-07-24 DIAGNOSIS — K5909 Other constipation: Secondary | ICD-10-CM | POA: Diagnosis not present

## 2017-07-24 DIAGNOSIS — I129 Hypertensive chronic kidney disease with stage 1 through stage 4 chronic kidney disease, or unspecified chronic kidney disease: Secondary | ICD-10-CM | POA: Diagnosis not present

## 2017-07-24 DIAGNOSIS — Z79891 Long term (current) use of opiate analgesic: Secondary | ICD-10-CM | POA: Diagnosis not present

## 2017-07-24 DIAGNOSIS — Z7902 Long term (current) use of antithrombotics/antiplatelets: Secondary | ICD-10-CM | POA: Diagnosis not present

## 2017-07-24 DIAGNOSIS — I480 Paroxysmal atrial fibrillation: Secondary | ICD-10-CM | POA: Diagnosis not present

## 2017-07-24 DIAGNOSIS — G4733 Obstructive sleep apnea (adult) (pediatric): Secondary | ICD-10-CM | POA: Diagnosis not present

## 2017-07-24 DIAGNOSIS — Z7951 Long term (current) use of inhaled steroids: Secondary | ICD-10-CM | POA: Diagnosis not present

## 2017-07-24 DIAGNOSIS — S72114D Nondisplaced fracture of greater trochanter of right femur, subsequent encounter for closed fracture with routine healing: Secondary | ICD-10-CM | POA: Diagnosis not present

## 2017-07-24 DIAGNOSIS — N183 Chronic kidney disease, stage 3 (moderate): Secondary | ICD-10-CM | POA: Diagnosis not present

## 2017-07-24 DIAGNOSIS — W19XXXD Unspecified fall, subsequent encounter: Secondary | ICD-10-CM | POA: Diagnosis not present

## 2017-07-28 DIAGNOSIS — K5909 Other constipation: Secondary | ICD-10-CM | POA: Diagnosis not present

## 2017-07-28 DIAGNOSIS — Z96652 Presence of left artificial knee joint: Secondary | ICD-10-CM | POA: Diagnosis not present

## 2017-07-28 DIAGNOSIS — N183 Chronic kidney disease, stage 3 (moderate): Secondary | ICD-10-CM | POA: Diagnosis not present

## 2017-07-28 DIAGNOSIS — I480 Paroxysmal atrial fibrillation: Secondary | ICD-10-CM | POA: Diagnosis not present

## 2017-07-28 DIAGNOSIS — W19XXXD Unspecified fall, subsequent encounter: Secondary | ICD-10-CM | POA: Diagnosis not present

## 2017-07-28 DIAGNOSIS — Z79891 Long term (current) use of opiate analgesic: Secondary | ICD-10-CM | POA: Diagnosis not present

## 2017-07-28 DIAGNOSIS — I129 Hypertensive chronic kidney disease with stage 1 through stage 4 chronic kidney disease, or unspecified chronic kidney disease: Secondary | ICD-10-CM | POA: Diagnosis not present

## 2017-07-28 DIAGNOSIS — G4733 Obstructive sleep apnea (adult) (pediatric): Secondary | ICD-10-CM | POA: Diagnosis not present

## 2017-07-28 DIAGNOSIS — Z79899 Other long term (current) drug therapy: Secondary | ICD-10-CM | POA: Diagnosis not present

## 2017-07-28 DIAGNOSIS — Z7902 Long term (current) use of antithrombotics/antiplatelets: Secondary | ICD-10-CM | POA: Diagnosis not present

## 2017-07-28 DIAGNOSIS — Z7951 Long term (current) use of inhaled steroids: Secondary | ICD-10-CM | POA: Diagnosis not present

## 2017-07-28 DIAGNOSIS — S72114D Nondisplaced fracture of greater trochanter of right femur, subsequent encounter for closed fracture with routine healing: Secondary | ICD-10-CM | POA: Diagnosis not present

## 2017-07-29 DIAGNOSIS — I129 Hypertensive chronic kidney disease with stage 1 through stage 4 chronic kidney disease, or unspecified chronic kidney disease: Secondary | ICD-10-CM | POA: Diagnosis not present

## 2017-07-29 DIAGNOSIS — Z7951 Long term (current) use of inhaled steroids: Secondary | ICD-10-CM | POA: Diagnosis not present

## 2017-07-29 DIAGNOSIS — W19XXXD Unspecified fall, subsequent encounter: Secondary | ICD-10-CM | POA: Diagnosis not present

## 2017-07-29 DIAGNOSIS — Z79891 Long term (current) use of opiate analgesic: Secondary | ICD-10-CM | POA: Diagnosis not present

## 2017-07-29 DIAGNOSIS — G4733 Obstructive sleep apnea (adult) (pediatric): Secondary | ICD-10-CM | POA: Diagnosis not present

## 2017-07-29 DIAGNOSIS — N183 Chronic kidney disease, stage 3 (moderate): Secondary | ICD-10-CM | POA: Diagnosis not present

## 2017-07-29 DIAGNOSIS — Z7902 Long term (current) use of antithrombotics/antiplatelets: Secondary | ICD-10-CM | POA: Diagnosis not present

## 2017-07-29 DIAGNOSIS — S72114D Nondisplaced fracture of greater trochanter of right femur, subsequent encounter for closed fracture with routine healing: Secondary | ICD-10-CM | POA: Diagnosis not present

## 2017-07-29 DIAGNOSIS — K5909 Other constipation: Secondary | ICD-10-CM | POA: Diagnosis not present

## 2017-07-29 DIAGNOSIS — Z96652 Presence of left artificial knee joint: Secondary | ICD-10-CM | POA: Diagnosis not present

## 2017-07-29 DIAGNOSIS — I480 Paroxysmal atrial fibrillation: Secondary | ICD-10-CM | POA: Diagnosis not present

## 2017-07-29 DIAGNOSIS — Z79899 Other long term (current) drug therapy: Secondary | ICD-10-CM | POA: Diagnosis not present

## 2017-07-30 DIAGNOSIS — K5909 Other constipation: Secondary | ICD-10-CM | POA: Diagnosis not present

## 2017-07-30 DIAGNOSIS — N183 Chronic kidney disease, stage 3 (moderate): Secondary | ICD-10-CM | POA: Diagnosis not present

## 2017-07-30 DIAGNOSIS — Z79899 Other long term (current) drug therapy: Secondary | ICD-10-CM | POA: Diagnosis not present

## 2017-07-30 DIAGNOSIS — G4733 Obstructive sleep apnea (adult) (pediatric): Secondary | ICD-10-CM | POA: Diagnosis not present

## 2017-07-30 DIAGNOSIS — W19XXXD Unspecified fall, subsequent encounter: Secondary | ICD-10-CM | POA: Diagnosis not present

## 2017-07-30 DIAGNOSIS — Z96652 Presence of left artificial knee joint: Secondary | ICD-10-CM | POA: Diagnosis not present

## 2017-07-30 DIAGNOSIS — I129 Hypertensive chronic kidney disease with stage 1 through stage 4 chronic kidney disease, or unspecified chronic kidney disease: Secondary | ICD-10-CM | POA: Diagnosis not present

## 2017-07-30 DIAGNOSIS — Z7951 Long term (current) use of inhaled steroids: Secondary | ICD-10-CM | POA: Diagnosis not present

## 2017-07-30 DIAGNOSIS — Z7902 Long term (current) use of antithrombotics/antiplatelets: Secondary | ICD-10-CM | POA: Diagnosis not present

## 2017-07-30 DIAGNOSIS — S72114D Nondisplaced fracture of greater trochanter of right femur, subsequent encounter for closed fracture with routine healing: Secondary | ICD-10-CM | POA: Diagnosis not present

## 2017-07-30 DIAGNOSIS — I480 Paroxysmal atrial fibrillation: Secondary | ICD-10-CM | POA: Diagnosis not present

## 2017-07-30 DIAGNOSIS — Z79891 Long term (current) use of opiate analgesic: Secondary | ICD-10-CM | POA: Diagnosis not present

## 2017-07-31 DIAGNOSIS — I129 Hypertensive chronic kidney disease with stage 1 through stage 4 chronic kidney disease, or unspecified chronic kidney disease: Secondary | ICD-10-CM | POA: Diagnosis not present

## 2017-07-31 DIAGNOSIS — Z79891 Long term (current) use of opiate analgesic: Secondary | ICD-10-CM | POA: Diagnosis not present

## 2017-07-31 DIAGNOSIS — I480 Paroxysmal atrial fibrillation: Secondary | ICD-10-CM | POA: Diagnosis not present

## 2017-07-31 DIAGNOSIS — Z7951 Long term (current) use of inhaled steroids: Secondary | ICD-10-CM | POA: Diagnosis not present

## 2017-07-31 DIAGNOSIS — S72114D Nondisplaced fracture of greater trochanter of right femur, subsequent encounter for closed fracture with routine healing: Secondary | ICD-10-CM | POA: Diagnosis not present

## 2017-07-31 DIAGNOSIS — Z79899 Other long term (current) drug therapy: Secondary | ICD-10-CM | POA: Diagnosis not present

## 2017-07-31 DIAGNOSIS — Z96652 Presence of left artificial knee joint: Secondary | ICD-10-CM | POA: Diagnosis not present

## 2017-07-31 DIAGNOSIS — K5909 Other constipation: Secondary | ICD-10-CM | POA: Diagnosis not present

## 2017-07-31 DIAGNOSIS — N183 Chronic kidney disease, stage 3 (moderate): Secondary | ICD-10-CM | POA: Diagnosis not present

## 2017-07-31 DIAGNOSIS — Z7902 Long term (current) use of antithrombotics/antiplatelets: Secondary | ICD-10-CM | POA: Diagnosis not present

## 2017-07-31 DIAGNOSIS — W19XXXD Unspecified fall, subsequent encounter: Secondary | ICD-10-CM | POA: Diagnosis not present

## 2017-07-31 DIAGNOSIS — G4733 Obstructive sleep apnea (adult) (pediatric): Secondary | ICD-10-CM | POA: Diagnosis not present

## 2017-08-02 DIAGNOSIS — G4733 Obstructive sleep apnea (adult) (pediatric): Secondary | ICD-10-CM | POA: Diagnosis not present

## 2017-08-05 DIAGNOSIS — Z7902 Long term (current) use of antithrombotics/antiplatelets: Secondary | ICD-10-CM | POA: Diagnosis not present

## 2017-08-05 DIAGNOSIS — Z7951 Long term (current) use of inhaled steroids: Secondary | ICD-10-CM | POA: Diagnosis not present

## 2017-08-05 DIAGNOSIS — I129 Hypertensive chronic kidney disease with stage 1 through stage 4 chronic kidney disease, or unspecified chronic kidney disease: Secondary | ICD-10-CM | POA: Diagnosis not present

## 2017-08-05 DIAGNOSIS — Z79891 Long term (current) use of opiate analgesic: Secondary | ICD-10-CM | POA: Diagnosis not present

## 2017-08-05 DIAGNOSIS — W19XXXD Unspecified fall, subsequent encounter: Secondary | ICD-10-CM | POA: Diagnosis not present

## 2017-08-05 DIAGNOSIS — S72114D Nondisplaced fracture of greater trochanter of right femur, subsequent encounter for closed fracture with routine healing: Secondary | ICD-10-CM | POA: Diagnosis not present

## 2017-08-05 DIAGNOSIS — K5909 Other constipation: Secondary | ICD-10-CM | POA: Diagnosis not present

## 2017-08-05 DIAGNOSIS — Z79899 Other long term (current) drug therapy: Secondary | ICD-10-CM | POA: Diagnosis not present

## 2017-08-05 DIAGNOSIS — Z96652 Presence of left artificial knee joint: Secondary | ICD-10-CM | POA: Diagnosis not present

## 2017-08-05 DIAGNOSIS — G4733 Obstructive sleep apnea (adult) (pediatric): Secondary | ICD-10-CM | POA: Diagnosis not present

## 2017-08-05 DIAGNOSIS — N183 Chronic kidney disease, stage 3 (moderate): Secondary | ICD-10-CM | POA: Diagnosis not present

## 2017-08-05 DIAGNOSIS — I480 Paroxysmal atrial fibrillation: Secondary | ICD-10-CM | POA: Diagnosis not present

## 2017-08-07 DIAGNOSIS — I132 Hypertensive heart and chronic kidney disease with heart failure and with stage 5 chronic kidney disease, or end stage renal disease: Secondary | ICD-10-CM | POA: Diagnosis not present

## 2017-08-07 DIAGNOSIS — Z23 Encounter for immunization: Secondary | ICD-10-CM | POA: Diagnosis not present

## 2017-08-07 DIAGNOSIS — E038 Other specified hypothyroidism: Secondary | ICD-10-CM | POA: Diagnosis not present

## 2017-08-07 DIAGNOSIS — R42 Dizziness and giddiness: Secondary | ICD-10-CM | POA: Diagnosis not present

## 2017-08-08 DIAGNOSIS — Z96652 Presence of left artificial knee joint: Secondary | ICD-10-CM | POA: Diagnosis not present

## 2017-08-08 DIAGNOSIS — Z79891 Long term (current) use of opiate analgesic: Secondary | ICD-10-CM | POA: Diagnosis not present

## 2017-08-08 DIAGNOSIS — Z79899 Other long term (current) drug therapy: Secondary | ICD-10-CM | POA: Diagnosis not present

## 2017-08-08 DIAGNOSIS — W19XXXD Unspecified fall, subsequent encounter: Secondary | ICD-10-CM | POA: Diagnosis not present

## 2017-08-08 DIAGNOSIS — Z7902 Long term (current) use of antithrombotics/antiplatelets: Secondary | ICD-10-CM | POA: Diagnosis not present

## 2017-08-08 DIAGNOSIS — S72114D Nondisplaced fracture of greater trochanter of right femur, subsequent encounter for closed fracture with routine healing: Secondary | ICD-10-CM | POA: Diagnosis not present

## 2017-08-08 DIAGNOSIS — K5909 Other constipation: Secondary | ICD-10-CM | POA: Diagnosis not present

## 2017-08-08 DIAGNOSIS — G4733 Obstructive sleep apnea (adult) (pediatric): Secondary | ICD-10-CM | POA: Diagnosis not present

## 2017-08-08 DIAGNOSIS — I480 Paroxysmal atrial fibrillation: Secondary | ICD-10-CM | POA: Diagnosis not present

## 2017-08-08 DIAGNOSIS — N183 Chronic kidney disease, stage 3 (moderate): Secondary | ICD-10-CM | POA: Diagnosis not present

## 2017-08-08 DIAGNOSIS — I129 Hypertensive chronic kidney disease with stage 1 through stage 4 chronic kidney disease, or unspecified chronic kidney disease: Secondary | ICD-10-CM | POA: Diagnosis not present

## 2017-08-08 DIAGNOSIS — Z7951 Long term (current) use of inhaled steroids: Secondary | ICD-10-CM | POA: Diagnosis not present

## 2017-08-12 DIAGNOSIS — N183 Chronic kidney disease, stage 3 (moderate): Secondary | ICD-10-CM | POA: Diagnosis not present

## 2017-08-12 DIAGNOSIS — Z96652 Presence of left artificial knee joint: Secondary | ICD-10-CM | POA: Diagnosis not present

## 2017-08-12 DIAGNOSIS — I129 Hypertensive chronic kidney disease with stage 1 through stage 4 chronic kidney disease, or unspecified chronic kidney disease: Secondary | ICD-10-CM | POA: Diagnosis not present

## 2017-08-12 DIAGNOSIS — Z79899 Other long term (current) drug therapy: Secondary | ICD-10-CM | POA: Diagnosis not present

## 2017-08-12 DIAGNOSIS — Z7951 Long term (current) use of inhaled steroids: Secondary | ICD-10-CM | POA: Diagnosis not present

## 2017-08-12 DIAGNOSIS — Z79891 Long term (current) use of opiate analgesic: Secondary | ICD-10-CM | POA: Diagnosis not present

## 2017-08-12 DIAGNOSIS — G4733 Obstructive sleep apnea (adult) (pediatric): Secondary | ICD-10-CM | POA: Diagnosis not present

## 2017-08-12 DIAGNOSIS — S72114D Nondisplaced fracture of greater trochanter of right femur, subsequent encounter for closed fracture with routine healing: Secondary | ICD-10-CM | POA: Diagnosis not present

## 2017-08-12 DIAGNOSIS — K5909 Other constipation: Secondary | ICD-10-CM | POA: Diagnosis not present

## 2017-08-12 DIAGNOSIS — I480 Paroxysmal atrial fibrillation: Secondary | ICD-10-CM | POA: Diagnosis not present

## 2017-08-12 DIAGNOSIS — W19XXXD Unspecified fall, subsequent encounter: Secondary | ICD-10-CM | POA: Diagnosis not present

## 2017-08-12 DIAGNOSIS — Z7902 Long term (current) use of antithrombotics/antiplatelets: Secondary | ICD-10-CM | POA: Diagnosis not present

## 2017-08-13 DIAGNOSIS — Z79899 Other long term (current) drug therapy: Secondary | ICD-10-CM | POA: Diagnosis not present

## 2017-08-13 DIAGNOSIS — I129 Hypertensive chronic kidney disease with stage 1 through stage 4 chronic kidney disease, or unspecified chronic kidney disease: Secondary | ICD-10-CM | POA: Diagnosis not present

## 2017-08-13 DIAGNOSIS — Z7902 Long term (current) use of antithrombotics/antiplatelets: Secondary | ICD-10-CM | POA: Diagnosis not present

## 2017-08-13 DIAGNOSIS — Z7951 Long term (current) use of inhaled steroids: Secondary | ICD-10-CM | POA: Diagnosis not present

## 2017-08-13 DIAGNOSIS — N183 Chronic kidney disease, stage 3 (moderate): Secondary | ICD-10-CM | POA: Diagnosis not present

## 2017-08-13 DIAGNOSIS — Z96652 Presence of left artificial knee joint: Secondary | ICD-10-CM | POA: Diagnosis not present

## 2017-08-13 DIAGNOSIS — K5909 Other constipation: Secondary | ICD-10-CM | POA: Diagnosis not present

## 2017-08-13 DIAGNOSIS — Z79891 Long term (current) use of opiate analgesic: Secondary | ICD-10-CM | POA: Diagnosis not present

## 2017-08-13 DIAGNOSIS — I480 Paroxysmal atrial fibrillation: Secondary | ICD-10-CM | POA: Diagnosis not present

## 2017-08-13 DIAGNOSIS — S72114D Nondisplaced fracture of greater trochanter of right femur, subsequent encounter for closed fracture with routine healing: Secondary | ICD-10-CM | POA: Diagnosis not present

## 2017-08-13 DIAGNOSIS — W19XXXD Unspecified fall, subsequent encounter: Secondary | ICD-10-CM | POA: Diagnosis not present

## 2017-08-13 DIAGNOSIS — G4733 Obstructive sleep apnea (adult) (pediatric): Secondary | ICD-10-CM | POA: Diagnosis not present

## 2017-08-20 DIAGNOSIS — S72114D Nondisplaced fracture of greater trochanter of right femur, subsequent encounter for closed fracture with routine healing: Secondary | ICD-10-CM | POA: Diagnosis not present

## 2017-08-26 DIAGNOSIS — N390 Urinary tract infection, site not specified: Secondary | ICD-10-CM | POA: Diagnosis not present

## 2017-08-26 DIAGNOSIS — N183 Chronic kidney disease, stage 3 (moderate): Secondary | ICD-10-CM | POA: Diagnosis not present

## 2017-09-01 DIAGNOSIS — G4733 Obstructive sleep apnea (adult) (pediatric): Secondary | ICD-10-CM | POA: Diagnosis not present

## 2017-09-01 NOTE — Telephone Encounter (Signed)
Opened in error

## 2017-09-08 DIAGNOSIS — J449 Chronic obstructive pulmonary disease, unspecified: Secondary | ICD-10-CM | POA: Diagnosis not present

## 2017-09-08 DIAGNOSIS — R05 Cough: Secondary | ICD-10-CM | POA: Diagnosis not present

## 2017-09-08 DIAGNOSIS — J069 Acute upper respiratory infection, unspecified: Secondary | ICD-10-CM | POA: Diagnosis not present

## 2017-09-15 DIAGNOSIS — G4733 Obstructive sleep apnea (adult) (pediatric): Secondary | ICD-10-CM | POA: Diagnosis not present

## 2017-09-30 ENCOUNTER — Ambulatory Visit: Payer: Self-pay | Admitting: Internal Medicine

## 2017-10-02 DIAGNOSIS — G4733 Obstructive sleep apnea (adult) (pediatric): Secondary | ICD-10-CM | POA: Diagnosis not present

## 2017-10-14 DIAGNOSIS — G4733 Obstructive sleep apnea (adult) (pediatric): Secondary | ICD-10-CM | POA: Diagnosis not present

## 2017-10-15 DIAGNOSIS — G4733 Obstructive sleep apnea (adult) (pediatric): Secondary | ICD-10-CM | POA: Diagnosis not present

## 2017-10-21 ENCOUNTER — Other Ambulatory Visit: Payer: Self-pay

## 2017-10-21 NOTE — Patient Outreach (Signed)
[facility].  She was inquiring about services.  I explained [facility] services to patient and she wanted to enroll.  After speaking with patient I found out that she lives at [facility] [address].  Her main concern is affording her Medication and Doctor bills.

## 2017-10-23 ENCOUNTER — Telehealth: Payer: Self-pay | Admitting: *Deleted

## 2017-10-23 ENCOUNTER — Telehealth: Payer: Self-pay | Admitting: Cardiovascular Disease

## 2017-10-23 ENCOUNTER — Telehealth: Payer: Self-pay | Admitting: Pharmacy Technician

## 2017-10-23 NOTE — Telephone Encounter (Signed)
Spoke with pt who is requesting a refill of clonazepam 2mg .  The last time pt had this refilled was done by Dr. Sharl MaLama on 05/28/17 but was refilled by Dr. Maple HudsonYoung last on 09/30/16.  Dr. Maple HudsonYoung, please advise if you are okay with us refilling this med for pt, and if so what quantity and how many refills?  Allergies  Allergen Reactions  . Vicodin [Hydrocodone-Acetaminophen] Nausea And Vomiting     Current Outpatient Medications:  .  acetaminophen (TYLENOL) 500 MG tablet, Take 1,000 mg by mouth every 6 (six) hours as needed for headache (minor discomfort (contact MD if headache or pain persists for more than 24 hours)., Disp: , Rfl:  .  baclofen (LIORESAL) 10 MG tablet, Take 5 mg by mouth at bedtime as needed (back spasms). , Disp: , Rfl:  .  budesonide-formoterol (SYMBICORT) 160-4.5 MCG/ACT inhaler, Inhale 2 puffs into the lungs 2 (two) times daily. Rinse mouth after use - discard 3 months after opening, Disp: , Rfl:  .  calcitRIOL (ROCALTROL) 0.25 MCG capsule, Take 0.25 mcg by mouth every Monday, Wednesday, and Friday. Do not crush, Disp: , Rfl:  .  calcium-vitamin D (OSCAL WITH D) 250-125 MG-UNIT tablet, Take 1 tablet by mouth 2 (two) times daily., Disp: , Rfl:  .  calcium-vitamin D (OSCAL WITH D) 500-200 MG-UNIT tablet, Take 1 tablet by mouth 2 (two) times daily., Disp: , Rfl:  .  clonazePAM (KLONOPIN) 2 MG tablet, Take 1 tablet (2 mg total) by mouth at bedtime., Disp: 5 tablet, Rfl: 0 .  clopidogrel (PLAVIX) 75 MG tablet, Take 1 tablet (75 mg total) by mouth daily., Disp: 30 tablet, Rfl: 5 .  diltiazem (CARDIZEM CD) 120 MG 24 hr capsule, Take 120 mg by mouth daily., Disp: , Rfl: 3 .  diltiazem (CARDIZEM) 30 MG tablet, Take 30 mg by mouth at bedtime as needed (palpitations or tachycardia). Do not crush, Disp: , Rfl:  .  diphenhydrAMINE (BENADRYL) 25 MG tablet, Take 25 mg by mouth every 4 (four) hours as needed for itching (minor allergic reaction)., Disp: , Rfl:  .  diphenoxylate-atropine (LOMOTIL)  2.5-0.025 MG tablet, Take 1 tablet by mouth 3 (three) times daily as needed for diarrhea or loose stools., Disp: 30 tablet, Rfl: 0 .  feeding supplement, ENSURE ENLIVE, (ENSURE ENLIVE) LIQD, Take 237 mLs by mouth 3 (three) times daily between meals., Disp: 237 mL, Rfl: 12 .  levothyroxine (SYNTHROID, LEVOTHROID) 112 MCG tablet, Take 112 mcg by mouth daily. , Disp: , Rfl:  .  memantine (NAMENDA) 10 MG tablet, Take 10 mg by mouth 2 (two) times daily., Disp: , Rfl:  .  metoprolol tartrate (LOPRESSOR) 25 MG tablet, Take 12.5 mg by mouth 2 (two) times daily. , Disp: , Rfl:  .  Omega-3 Fatty Acids (FISH OIL) 1000 MG CAPS, Take 1,000 mg by mouth daily. Do not crush, Disp: , Rfl:  .  ondansetron (ZOFRAN) 4 MG tablet, Take 4 mg by mouth every 8 (eight) hours as needed for nausea or vomiting., Disp: , Rfl:  .  oxyCODONE (OXY IR/ROXICODONE) 5 MG immediate release tablet, Take 1 tablet (5 mg total) by mouth every 4 (four) hours as needed for breakthrough pain., Disp: 30 tablet, Rfl: 0 .  polyethylene glycol (MIRALAX / GLYCOLAX) packet, Take 17 g by mouth daily as needed (constipation). Mix in 8 oz of water and drink, Disp: , Rfl:  .  senna-docusate (SENOKOT-S) 8.6-50 MG tablet, Take 1 tablet by mouth 2 (two) times daily., Disp:  10 tablet, Rfl: 0 .  sertraline (ZOLOFT) 100 MG tablet, Take 150 mg by mouth daily., Disp: , Rfl:  .  traMADol (ULTRAM) 50 MG tablet, Take by mouth., Disp: , Rfl:  .  traZODone (DESYREL) 100 MG tablet, Take 1 tablet (100 mg total) by mouth at bedtime., Disp: 30 tablet, Rfl: 12

## 2017-10-23 NOTE — Telephone Encounter (Signed)
Patient calling, states that she would like to discuss her medications.

## 2017-10-23 NOTE — Telephone Encounter (Signed)
Returned the call to the patient. She wanted to verify her cardiac medications: diltiazem and metoprolol. She verbalized her understanding.

## 2017-10-24 ENCOUNTER — Telehealth: Payer: Self-pay | Admitting: Internal Medicine

## 2017-10-24 ENCOUNTER — Telehealth: Payer: Self-pay | Admitting: Adult Health

## 2017-10-24 MED ORDER — CLONAZEPAM 2 MG PO TABS
2.0000 mg | ORAL_TABLET | Freq: Every day | ORAL | 5 refills | Status: DC
Start: 2017-10-24 — End: 2018-04-30

## 2017-10-24 NOTE — Telephone Encounter (Signed)
Spoke to pt and she had question about her medication  (namenda/ memantine) dose.  I relayed that it is 10mg  po BID.  She was last seen 06/2017.  She had new prescription from Dr. Jetty Duhamellinton Young for clonazepam 2mg  po qhs from 10-24-17.  I relayed this to her as well.  She verbalized understanding.

## 2017-10-24 NOTE — Telephone Encounter (Signed)
Patient is calling to get correct dosage for memantine (NAMENDA) 10 MG tablet.

## 2017-10-24 NOTE — Telephone Encounter (Signed)
Called and spoke with pt and she is aware of refill of the klonopin that has been called to the pharmacy. Nothing further is needed.

## 2017-10-24 NOTE — Telephone Encounter (Signed)
Ok to refill total 6 months 

## 2017-10-24 NOTE — Telephone Encounter (Signed)
Left message for patient to call back  

## 2017-10-24 NOTE — Telephone Encounter (Signed)
What is

## 2017-10-29 NOTE — Telephone Encounter (Signed)
Andrey CampanileSandy can you call this patient and clarify dosing with her.

## 2017-10-29 NOTE — Telephone Encounter (Signed)
See other phone note.  Spoke to pt on 10/24/2017.  Namenda/memantine 10mg  po bid.

## 2017-10-29 NOTE — Telephone Encounter (Signed)
No return call from the patient.  Attempted to reach the patient again with no answer.  I will await a return call

## 2017-11-03 ENCOUNTER — Other Ambulatory Visit: Payer: Self-pay | Admitting: Pharmacist

## 2017-11-03 NOTE — Patient Outreach (Signed)
[facility]) Care Management  11/03/2017  [name] 26-Mar-1941 [id number]  Patient was referred to [facility] CM Pharmacist by [facility] Care Management Assistant, Fredonia Highlandamika, after patient replied to [facility] outreach.  Patient requesting evaluation for medication patient assistance.   Successful phone outreach to patient, HIPAA details verified, and purpose of call explained to patient.  Patient reports she was having a bad day---offered to call back another day---she preferred this.   Plan:  Outreach attempt later this week per patient request.   Tommye StandardKevin Elsy Chiang, PharmD, [facility] Clinical Pharmacist [facility] [phone]

## 2017-11-05 DIAGNOSIS — E038 Other specified hypothyroidism: Secondary | ICD-10-CM | POA: Diagnosis not present

## 2017-11-05 DIAGNOSIS — I132 Hypertensive heart and chronic kidney disease with heart failure and with stage 5 chronic kidney disease, or end stage renal disease: Secondary | ICD-10-CM | POA: Diagnosis not present

## 2017-11-05 DIAGNOSIS — G308 Other Alzheimer's disease: Secondary | ICD-10-CM | POA: Diagnosis not present

## 2017-11-06 ENCOUNTER — Other Ambulatory Visit: Payer: Self-pay | Admitting: Pharmacist

## 2017-11-06 DIAGNOSIS — E038 Other specified hypothyroidism: Secondary | ICD-10-CM | POA: Diagnosis not present

## 2017-11-06 DIAGNOSIS — I132 Hypertensive heart and chronic kidney disease with heart failure and with stage 5 chronic kidney disease, or end stage renal disease: Secondary | ICD-10-CM | POA: Diagnosis not present

## 2017-11-06 DIAGNOSIS — I119 Hypertensive heart disease without heart failure: Secondary | ICD-10-CM | POA: Diagnosis not present

## 2017-11-06 DIAGNOSIS — G308 Other Alzheimer's disease: Secondary | ICD-10-CM | POA: Diagnosis not present

## 2017-11-06 NOTE — Patient Outreach (Signed)
[facility]) Care Management  11/06/2017  [name] 14-Sep-1941 [id number]  Successful phone outreach to patient.  HIPAA details verified and purpose of call explained to patient.  Patient reports she is concerned with cost of medications and doctor appointments going up.  She reports she received a letter stating her co-pays were increasing for both.    Patient reports she [facility] [place]---she reports her medications are kept on a med cart by the facility and administered to her.  She is unable to review her medications as they are administered by a nursing facility per patient.      Patient reports she did receive an increase in Social Security for 2019.  Reviewed patient insurance, appears she has [employer] Dual Complete SNP---patient confirms this.  Discussed with patient options to lower costs are limited when she has medicare and medicaid as her insurance plan indicates.  Discussed with patient if there was a change with financials that could affect her benefits---offered to contact her insurance or Social Security Administration via phone with patient---she declined this.  Discussed with patient [facility] Pharmacist will be unable to verify her benefits with SSA without her on the phone and she verbalized understanding of this.    Patient was counseled she can contact her insurance, Social Security, or Medicaid regarding her benefits questions.  She was provided with [facility] Pharmac[facility] phone number to call if she decides later she wishes to complete call to Social Security regarding her Extra Help with [facility] Pharmacist.   Discussed 90 day supply of maintenance medications may offer cost savings to her---she reports getting some 90 day supply but not all.    Plan:  Will close patient's case as she declined wanting to make call to Social Security with [facility] Pharmacist.   She was provided [facility] Pharmacist phone number.   Will route note to PCP as an update.    Tommye StandardKevin  [name], PharmD, [facility] Clinical Pharmacist [facility] [phone]

## 2017-11-14 DIAGNOSIS — G4733 Obstructive sleep apnea (adult) (pediatric): Secondary | ICD-10-CM | POA: Diagnosis not present

## 2017-11-24 ENCOUNTER — Telehealth: Payer: Self-pay | Admitting: Internal Medicine

## 2017-11-24 NOTE — Telephone Encounter (Signed)
Spoke with Misty StanleyLisa. She stated that the patient was asking for a refill on clonazepam, clonidine and tramadol. Belinda Fisherdvised Lisa that the tramadol and clonidine had never bee prescribed by this office. The clonidine is not her current medication list here nor at the facility that she is at.   She is however asking for refills on clonazepam. Per her chart, a refill was sent in on 12/7 with 5 refills. Misty StanleyLisa is aware. Nothing else needed at time of call.

## 2017-12-15 DIAGNOSIS — G4733 Obstructive sleep apnea (adult) (pediatric): Secondary | ICD-10-CM | POA: Diagnosis not present

## 2018-01-08 ENCOUNTER — Encounter: Payer: Self-pay | Admitting: Cardiovascular Disease

## 2018-01-08 ENCOUNTER — Ambulatory Visit (INDEPENDENT_AMBULATORY_CARE_PROVIDER_SITE_OTHER): Payer: Medicare Other | Admitting: Cardiovascular Disease

## 2018-01-08 VITALS — BP 154/92 | HR 77 | Ht 63.0 in | Wt 119.0 lb

## 2018-01-08 DIAGNOSIS — N183 Chronic kidney disease, stage 3 unspecified: Secondary | ICD-10-CM

## 2018-01-08 DIAGNOSIS — I471 Supraventricular tachycardia: Secondary | ICD-10-CM

## 2018-01-08 DIAGNOSIS — I10 Essential (primary) hypertension: Secondary | ICD-10-CM | POA: Diagnosis not present

## 2018-01-08 DIAGNOSIS — F0150 Vascular dementia without behavioral disturbance: Secondary | ICD-10-CM | POA: Diagnosis not present

## 2018-01-08 DIAGNOSIS — G4733 Obstructive sleep apnea (adult) (pediatric): Secondary | ICD-10-CM | POA: Diagnosis not present

## 2018-01-08 NOTE — Progress Notes (Signed)
Patient ID: [name], female   DOB: [birth year], 77 y.o.   MRN: [id number]    Cardiology Office Note    Date:  01/11/2018   ID:  [name], [name] 04/18/1941, MRN [id number]  PCP:  Ana Rakers, MD  Cardiologist:   Ana Fair, MD   Chief complaint: Deteriorating memory, falls  History of Present Illness:  [name] is a 77 y.o. female with a history of OSA, paroxysmal atrial tachycardia who presents for follow-up.   Her memory and overall function school status continued to deteriorate.  Having said that, she has no cardiovascular complaints.  She recently lost her balance and fell, thankfully this time without serious injury.  She has not had any problems with palpitations.  Weight loss has improved.  She has a history of paroxysmal atrial tachycardia up to about 180 bpm and palpitations were a big problem when she was receiving excessive levothyroxine supplementation, but has not had palpitations recently. Most recent TSH is normal 1.254.    2018 cardiac workup showed normal left ventricular systolic function, normal left atrial size, no valvular abnormalities, no evidence of coronary insufficiency on remote nuclear perfusion study.  Past Medical History:  Diagnosis Date  . Abnormal heart rhythm   . Abnormality of gait 12/21/2015  . Afib (HCC)    h/o  . Anxiety   . Arthritis    "hips, shoulders" (01/05/2015)  . Back pain   . Chronic kidney disease (CKD), stage III (moderate) (HCC)    [name] 01/05/2015  . Dementia   . Depression   . Echocardiogram abnormal 02/26/11   MVP,mild MR,AOV mildly sclerotic. EF >55%  . GERD (gastroesophageal reflux disease)   . Hypertension   . Hypothyroidism   . Memory difficulty 06/05/2016  . Nausea & vomiting 11/2016  . Osteoporosis   . Osteoporosis   . Reactive airway disease   . S/P right hip fracture 06/2017   No surgery required  . Scoliosis   . Sleep apnea    "suppose to wear a mask; can't tolerate it" (01/05/2015)    Past  Surgical History:  Procedure Laterality Date  . CATARACT EXTRACTION, BILATERAL Bilateral   . CESAREAN SECTION  1967  . CESAREAN SECTION  1971  . DILATION AND CURETTAGE OF UTERUS    . FLEXIBLE SIGMOIDOSCOPY N/A 11/24/2016   Procedure: FLEXIBLE SIGMOIDOSCOPY;  Surgeon: Ana Boop, MD;  Location[facility] ENDOSCOPY;  Service: Endoscopy;  Laterality: N/A;  . JOINT REPLACEMENT    . OPEN REDUCTION INTERNAL FIXATION (ORIF) DISTAL RADIAL FRACTURE Left 12/2009   [name] 12/28/2009  . TOTAL KNEE ARTHROPLASTY Left 03/2010   [name] 04/07/2010  . TUBAL LIGATION    . VAGINAL HYSTERECTOMY  1980    Outpatient Medications Prior to Visit  Medication Sig Dispense Refill  . acetaminophen (TYLENOL) 325 MG tablet Take 650 mg by mouth 4 (four) times daily.     [name] anti-nausea (EMETROL) solution Take 10 mLs by mouth every 15 (fifteen) minutes as needed for nausea or vomiting.    . baclofen (LIORESAL) 10 MG tablet Take 10 mg by mouth at bedtime as needed for muscle spasms.     . budesonide-formoterol (SYMBICORT) 160-4.5 MCG/ACT inhaler Inhale 2 puffs into the lungs 2 (two) times daily. Rinse mouth after use - discard 3 months after opening    . calcitRIOL (ROCALTROL) 0.25 MCG capsule Take 0.25 mcg by mouth every Monday, Wednesday, and Friday. Do not crush    . clonazePAM (KLONOPIN) 2 MG tablet  Take 1 tablet (2 mg total) by mouth at bedtime. 30 tablet 5  . clopidogrel (PLAVIX) 75 MG tablet Take 1 tablet (75 mg total) by mouth daily. 30 tablet 5  . diltiazem (CARDIZEM CD) 120 MG 24 hr capsule Take 120 mg by mouth daily.  3  . diphenhydrAMINE (BENADRYL) 25 MG tablet Take 25 mg by mouth every 4 (four) hours as needed for itching (minor allergic reaction).    . diphenoxylate-atropine (LOMOTIL) 2.5-0.025 MG tablet Take 1 tablet by mouth 3 (three) times daily as needed for diarrhea or loose stools. 30 tablet 0  . levothyroxine (SYNTHROID, LEVOTHROID) 112 MCG tablet Take 112 mcg by mouth daily.     [name] loperamide (IMODIUM) 2 MG  capsule Take 2 mg by mouth as needed for diarrhea or loose stools.    . memantine (NAMENDA) 10 MG tablet Take 10 mg by mouth 2 (two) times daily.    . metoprolol tartrate (LOPRESSOR) 25 MG tablet Take 12.5 mg by mouth 2 (two) times daily.     . Omega-3 Fatty Acids (FISH OIL) 1000 MG CAPS Take 1,000 mg by mouth daily. Do not crush    . ondansetron (ZOFRAN) 4 MG tablet Take 4 mg by mouth every 8 (eight) hours as needed for nausea or vomiting.    . polyethylene glycol (MIRALAX / GLYCOLAX) packet Take 17 g by mouth daily. Mix in 8 oz of water and drink    . senna-docusate (SENOKOT-S) 8.6-50 MG tablet Take 1 tablet by mouth 2 (two) times daily. (Patient not taking: Reported on 01/09/2018) 10 tablet 0  . sertraline (ZOLOFT) 100 MG tablet Take 150 mg by mouth daily.    . traZODone (DESYREL) 100 MG tablet Take 1 tablet (100 mg total) by mouth at bedtime. (Patient not taking: Reported on 01/09/2018) 30 tablet 12  . AMITIZA 24 MCG capsule     . calcium-vitamin D (OSCAL WITH D) 500-200 MG-UNIT tablet Take 1 tablet by mouth 2 (two) times daily.    [name] diltiazem (CARDIZEM) 30 MG tablet Take 30 mg by mouth at bedtime as needed (palpitations or tachycardia). Do not crush    . traMADol (ULTRAM) 50 MG tablet Take by mouth.    . calcium-vitamin D (OSCAL WITH D) 250-125 MG-UNIT tablet Take 1 tablet by mouth 2 (two) times daily.    . feeding supplement, ENSURE ENLIVE, (ENSURE ENLIVE) LIQD Take 237 mLs by mouth 3 (three) times daily between meals. (Patient not taking: Reported on 01/08/2018) 237 mL 12  . oxyCODONE (OXY IR/ROXICODONE) 5 MG immediate release tablet Take 1 tablet (5 mg total) by mouth every 4 (four) hours as needed for breakthrough pain. (Patient taking differently: Take 7.5 mg by mouth every 4 (four) hours as needed for breakthrough pain. ) 30 tablet 0  . acetaminophen (TYLENOL) 500 MG tablet Take 1,000 mg by mouth every 6 (six) hours as needed for headache (minor discomfort (contact MD if headache or pain  persists for more than 24 hours).     No facility-administered medications prior to visit.      Allergies:   Vicodin [hydrocodone-acetaminophen]   Social History   Socioeconomic History  . Marital status: Divorced    Spouse name: None  . Number of children: 2  . Years of education: 53  . Highest education level: None  Social Needs  . Financial resource strain: None  . Food insecurity - worry: None  . Food insecurity - inability: None  . Transportation needs - medical: None  .  Transportation needs - non-medical: None  Occupational History  . Occupation: retired  Tobacco Use  . Smoking status: Never Smoker  . Smokeless tobacco: Never Used  Substance and Sexual Activity  . Alcohol use: No  . Drug use: No  . Sexual activity: No  Other Topics Concern  . None  Social History Narrative   Patient drinks caffeine twice a month.   Patient is right handed.      Family History:  The patient's family history includes Cancer in her father and mother; Heart disease in her father.   ROS:   Please see the history of present illness.    ROS All other systems reviewed and are negative.   PHYSICAL EXAM:   VS:  BP (!) 154/92 (BP Location: Left Arm, Patient Position: Sitting, Cuff Size: Normal)   Pulse 77   Ht 5\' 3"  (1.6 m)   Wt 119 lb (54 kg)   BMI 21.08 kg/m     General: Alert, oriented x3, no distress, lean, no longer cachectic Head: no evidence of trauma, PERRL, EOMI, no exophtalmos or lid lag, no myxedema, no xanthelasma; normal ears, nose and oropharynx Neck: normal jugular venous pulsations and no hepatojugular reflux; brisk carotid pulses without delay and no carotid bruits Chest: clear to auscultation, no signs of consolidation by percussion or palpation, normal fremitus, symmetrical and full respiratory excursions Cardiovascular: normal position and quality of the apical impulse, regular rhythm, normal first and second heart sounds, no murmurs, rubs or gallops Abdomen:  no tenderness or distention, no masses by palpation, no abnormal pulsatility or arterial bruits, normal bowel sounds, no hepatosplenomegaly Extremities: no clubbing, cyanosis or edema; 2+ radial, ulnar and brachial pulses bilaterally; 2+ right femoral, posterior tibial and dorsalis pedis pulses; 2+ left femoral, posterior tibial and dorsalis pedis pulses; no subclavian or femoral bruits Neurological: grossly nonfocal Psych: Normal mood and affect   Wt Readings from Last 3 Encounters:  01/09/18 114 lb (51.7 kg)  01/08/18 119 lb (54 kg)  07/15/17 109 lb 12.8 oz (49.8 kg)      Studies/Labs Reviewed:   EKG:  EKG is ordered today. It shows NSR , nonspecific T wave changes, borderline QTC 452 ms Recent Labs: 05/26/2017: Magnesium 1.8 05/27/2017: TSH 1.254 01/09/2018: ALT 37; BUN 15; Creatinine, Ser 1.46; Hemoglobin 12.4; Platelets 139; Potassium 3.3; Sodium 143   Lipid Panel    Component Value Date/Time   CHOL 196 09/15/2014 1224   TRIG 202 (H) 09/15/2014 1224   HDL 54 09/15/2014 1224   CHOLHDL 3.6 09/15/2014 1224   VLDL 40 09/15/2014 1224   LDLCALC 102 (H) 09/15/2014 1224   Oct 2018, [place] [facility] Creatinine 1.3, nightly 18, normal LFTs including albumin 4.1, normal urinalysis, hemoglobin 12.7, normal iron studies  ASSESSMENT:    1. PAT (paroxysmal atrial tachycardia) (HCC)   2. Essential hypertension   3. Vascular dementia without behavioral disturbance   4. CKD (chronic kidney disease) stage 3, GFR 30-59 ml/min (HCC)   5. OSA (obstructive sleep apnea)      PLAN:  In order of problems listed above:  1. PAT: This was a big problem when she was hyperthyroid, but is no longer bothering her. 2. HTN: Pressure has increased from her last appointment, but with her frequent falls and reluctant to make any changes in her medications.. 3. Vascular dementia: Aricept helped her memory, is calmer now 4. 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  Her last creatinine is slightly worsened at  1.46 (as high as 2.0 in June 2017). 5. OSA: not using CPAP and no symptoms of hypersomnolence (OSA improved by weight loss?).  Medication Adjustments/Labs and Tests Ordered: Current medicines are reviewed at length with the patient today.  Concerns regarding medicines are outlined above.  Medication changes, Labs and Tests ordered today are listed in the Patient Instructions below. Patient Instructions  Dr Royann Shivers recommends that you schedule a follow-up appointment in 12 months. You will receive a reminder letter in the mail two months in advance. If you don't receive a letter, please call our office to schedule the follow-up appointment.  If you need a refill on your cardiac medications before your next appointment, please call your pharmacy.      Signed, Ana Fair, MD  01/11/2018 2:56 PM    [facility] [address] [place], [place], [place]  [address] Phone: [phone]; Fax: [phone]

## 2018-01-08 NOTE — Patient Instructions (Signed)
Dr Croitoru recommends that you schedule a follow-up appointment in 12 months. You will receive a reminder letter in the mail two months in advance. If you don't receive a letter, please call our office to schedule the follow-up appointment.  If you need a refill on your cardiac medications before your next appointment, please call your pharmacy. 

## 2018-01-09 ENCOUNTER — Encounter (HOSPITAL_COMMUNITY): Payer: Self-pay

## 2018-01-09 ENCOUNTER — Emergency Department (HOSPITAL_COMMUNITY): Payer: Medicare Other

## 2018-01-09 ENCOUNTER — Emergency Department (HOSPITAL_COMMUNITY)
Admission: EM | Admit: 2018-01-09 | Discharge: 2018-01-09 | Disposition: A | Discharge status: Home / Self Care | Payer: Medicare Other | Attending: Emergency Medicine | Admitting: Emergency Medicine

## 2018-01-09 ENCOUNTER — Other Ambulatory Visit: Payer: Self-pay

## 2018-01-09 DIAGNOSIS — N183 Chronic kidney disease, stage 3 (moderate): Secondary | ICD-10-CM | POA: Insufficient documentation

## 2018-01-09 DIAGNOSIS — Z96652 Presence of left artificial knee joint: Secondary | ICD-10-CM | POA: Insufficient documentation

## 2018-01-09 DIAGNOSIS — E039 Hypothyroidism, unspecified: Secondary | ICD-10-CM | POA: Insufficient documentation

## 2018-01-09 DIAGNOSIS — I129 Hypertensive chronic kidney disease with stage 1 through stage 4 chronic kidney disease, or unspecified chronic kidney disease: Secondary | ICD-10-CM | POA: Insufficient documentation

## 2018-01-09 DIAGNOSIS — Y929 Unspecified place or not applicable: Secondary | ICD-10-CM | POA: Insufficient documentation

## 2018-01-09 DIAGNOSIS — Y999 Unspecified external cause status: Secondary | ICD-10-CM | POA: Insufficient documentation

## 2018-01-09 DIAGNOSIS — S59911A Unspecified injury of right forearm, initial encounter: Secondary | ICD-10-CM | POA: Diagnosis not present

## 2018-01-09 DIAGNOSIS — Y9301 Activity, walking, marching and hiking: Secondary | ICD-10-CM | POA: Insufficient documentation

## 2018-01-09 DIAGNOSIS — W0110XA Fall on same level from slipping, tripping and stumbling with subsequent striking against unspecified object, initial encounter: Secondary | ICD-10-CM | POA: Insufficient documentation

## 2018-01-09 DIAGNOSIS — S0003XA Contusion of scalp, initial encounter: Secondary | ICD-10-CM

## 2018-01-09 DIAGNOSIS — S0990XA Unspecified injury of head, initial encounter: Secondary | ICD-10-CM | POA: Diagnosis present

## 2018-01-09 LAB — COMPREHENSIVE METABOLIC PANEL
ALT: 37 U/L (ref 14–54)
AST: 35 U/L (ref 15–41)
Albumin: 3.6 g/dL (ref 3.5–5.0)
Alkaline Phosphatase: 92 U/L (ref 38–126)
Anion gap: 13 (ref 5–15)
BUN: 15 mg/dL (ref 6–20)
CHLORIDE: 104 mmol/L (ref 101–111)
CO2: 26 mmol/L (ref 22–32)
CREATININE: 1.46 mg/dL — AB (ref 0.44–1.00)
Calcium: 9.5 mg/dL (ref 8.9–10.3)
GFR calc non Af Amer: 34 mL/min — ABNORMAL LOW (ref >60)
GFR, EST AFRICAN AMERICAN: 40 mL/min — AB (ref >60)
Glucose, Bld: 83 mg/dL (ref 65–99)
POTASSIUM: 3.3 mmol/L — AB (ref 3.5–5.1)
Sodium: 143 mmol/L (ref 135–145)
Total Bilirubin: 0.5 mg/dL (ref 0.3–1.2)
Total Protein: 7.1 g/dL (ref 6.5–8.1)

## 2018-01-09 LAB — CBC WITH DIFFERENTIAL/PLATELET
Basophils Absolute: 0 10*3/uL (ref 0.0–0.1)
Basophils Relative: 1 %
EOS PCT: 1 %
Eosinophils Absolute: 0 10*3/uL (ref 0.0–0.7)
HCT: 38.7 % (ref 36.0–46.0)
Hemoglobin: 12.4 g/dL (ref 12.0–15.0)
Lymphocytes Relative: 34 %
Lymphs Abs: 1.3 10*3/uL (ref 0.7–4.0)
MCH: 32.1 pg (ref 26.0–34.0)
MCHC: 32 g/dL (ref 30.0–36.0)
MCV: 100.3 fL — ABNORMAL HIGH (ref 78.0–100.0)
Monocytes Absolute: 0.3 10*3/uL (ref 0.1–1.0)
Monocytes Relative: 8 %
Neutro Abs: 2.2 10*3/uL (ref 1.7–7.7)
Neutrophils Relative %: 56 %
PLATELETS: 139 10*3/uL — AB (ref 150–400)
RBC: 3.86 MIL/uL — AB (ref 3.87–5.11)
RDW: 13 % (ref 11.5–15.5)
WBC: 3.9 10*3/uL — AB (ref 4.0–10.5)

## 2018-01-09 LAB — URINALYSIS, ROUTINE W REFLEX MICROSCOPIC
Bilirubin Urine: NEGATIVE
Glucose, UA: NEGATIVE mg/dL
HGB URINE DIPSTICK: NEGATIVE
KETONES UR: NEGATIVE mg/dL
Leukocytes, UA: NEGATIVE
Nitrite: NEGATIVE
PROTEIN: NEGATIVE mg/dL
Specific Gravity, Urine: 1.009 (ref 1.005–1.030)
pH: 6 (ref 5.0–8.0)

## 2018-01-09 MED ORDER — ACETAMINOPHEN 325 MG PO TABS
650.0000 mg | ORAL_TABLET | Freq: Once | ORAL | Status: AC
Start: 2018-01-09 — End: 2018-01-09
  Administered 2018-01-09: 650 mg via ORAL
  Filled 2018-01-09: qty 2

## 2018-01-09 MED ORDER — TRAMADOL HCL 50 MG PO TABS
50.0000 mg | ORAL_TABLET | Freq: Once | ORAL | Status: AC
  Administered 2018-01-09: 50 mg via ORAL
  Filled 2018-01-09: qty 1

## 2018-01-09 MED ORDER — ONDANSETRON 4 MG PO TBDP
4.0000 mg | ORAL_TABLET | Freq: Once | ORAL | Status: AC
  Administered 2018-01-09: 4 mg via ORAL
  Filled 2018-01-09: qty 1

## 2018-01-09 NOTE — Medical Student Note (Incomplete)
[facility]-EMERGENCY DEPT Provider Student Note For educational purposes for Medical, PA and NP students only and not part of the legal medical record.   CSN: [id number] Arrival date & time: 01/09/18  0447     History   Chief Complaint Chief Complaint  Patient presents with  . Fall    HPI [name] is a 77 y.o. female who presents to the ED via EMS from her nursing home. She provides a history of falling early this morning while using her walker and hitting the back of her head. Pt states that she vomited shortly after hitting her head, eventually got herself into her bed and called the nursing staff at her home to help her. She reports no other injuries besides a hematoma on her right occipital region. Her symptoms include 10/10 headache and blurry vision. She denies any continuation of vomiting, dizziness, weakness or loss of consciousness.    HPI  Past Medical History:  Diagnosis Date  . Abnormal heart rhythm   . Abnormality of gait 12/21/2015  . Afib (HCC)    h/o  . Anxiety   . Arthritis    "hips, shoulders" (01/05/2015)  . Back pain   . Chronic kidney disease (CKD), stage III (moderate) (HCC)    [name] 01/05/2015  . Dementia   . Depression   . Echocardiogram abnormal 02/26/11   MVP,mild MR,AOV mildly sclerotic. EF >55%  . GERD (gastroesophageal reflux disease)   . Hypertension   . Hypothyroidism   . Memory difficulty 06/05/2016  . Nausea & vomiting 11/2016  . Osteoporosis   . Osteoporosis   . Reactive airway disease   . S/P right hip fracture 06/2017   No surgery required  . Scoliosis   . Sleep apnea    "suppose to wear a mask; can't tolerate it" (01/05/2015)    Patient Active Problem List   Diagnosis Date Noted  . Femur fracture, right (HCC) 05/26/2017  . Hypokalemia 05/26/2017  . Severely underweight adult 01/02/2017  . Diarrhea   . Colitis   . Dehydration 11/18/2016  . AKI (acute kidney injury) (HCC) 11/18/2016  . Abdominal pain   . Intractable  vomiting with nausea   . Renal insufficiency   . Insomnia 09/30/2016  . Memory difficulty 06/05/2016  . Abnormality of gait 12/21/2015  . Cytopenia 02/09/2015  . Protein-calorie malnutrition, severe (HCC) 01/07/2015  . Vomiting and diarrhea 01/05/2015  . Sleep walking 10/08/2014  . Restless legs 10/08/2014  . Essential hypertension 09/16/2014  . Chronic kidney disease, stage 3 (HCC) 09/16/2014  . Paroxysmal atrial tachycardia (HCC) 09/16/2014  . Hypothyroidism 09/16/2014  . Obstructive sleep apnea 07/18/2011    Past Surgical History:  Procedure Laterality Date  . CATARACT EXTRACTION, BILATERAL Bilateral   . CESAREAN SECTION  1967  . CESAREAN SECTION  1971  . DILATION AND CURETTAGE OF UTERUS    . FLEXIBLE SIGMOIDOSCOPY N/A 11/24/2016   Procedure: FLEXIBLE SIGMOIDOSCOPY;  Surgeon: Iva Boop, MD;  Location[facility] ENDOSCOPY;  Service: Endoscopy;  Laterality: N/A;  . JOINT REPLACEMENT    . OPEN REDUCTION INTERNAL FIXATION (ORIF) DISTAL RADIAL FRACTURE Left 12/2009   [name] 12/28/2009  . TOTAL KNEE ARTHROPLASTY Left 03/2010   [name] 04/07/2010  . TUBAL LIGATION    . VAGINAL HYSTERECTOMY  1980    OB History    No data available       Home Medications    Prior to Admission medications   Medication Sig Start Date End Date Taking? Authorizing Provider  acetaminophen (TYLENOL) 325 MG tablet Take 650 mg by mouth 4 (four) times daily.    Yes [provider]  anti-nausea (EMETROL) solution Take 10 mLs by mouth every 15 (fifteen) minutes as needed for nausea or vomiting.   Yes [provider]  baclofen (LIORESAL) 10 MG tablet Take 10 mg by mouth at bedtime as needed for muscle spasms.    Yes [provider]  budesonide-formoterol (SYMBICORT) 160-4.5 MCG/ACT inhaler Inhale 2 puffs into the lungs 2 (two) times daily. Rinse mouth after use - discard 3 months after opening   Yes [provider]  calcitRIOL (ROCALTROL) 0.25 MCG capsule Take 0.25 mcg by  mouth every Monday, Wednesday, and Friday. Do not crush   Yes [provider]  calcium-vitamin D (OSCAL WITH D) 250-125 MG-UNIT tablet Take 1 tablet by mouth 2 (two) times daily.   Yes [provider]  clonazePAM (KLONOPIN) 2 MG tablet Take 1 tablet (2 mg total) by mouth at bedtime. 10/24/17  Yes Young, Joni Fearslinton D, MD  clopidogrel (PLAVIX) 75 MG tablet Take 1 tablet (75 mg total) by mouth daily. 06/05/16  Yes York SpanielWillis, Charles K, MD  diltiazem (CARDIZEM CD) 120 MG 24 hr capsule Take 120 mg by mouth daily. 05/21/17  Yes [provider]  diltiazem (CARDIZEM) 30 MG tablet Take 30 mg by mouth at bedtime as needed (pulse greater than 110).   Yes [provider]  diphenhydrAMINE (BENADRYL) 25 MG tablet Take 25 mg by mouth every 4 (four) hours as needed for itching (minor allergic reaction).   Yes [provider]  diphenoxylate-atropine (LOMOTIL) 2.5-0.025 MG tablet Take 1 tablet by mouth 3 (three) times daily as needed for diarrhea or loose stools. 11/26/16  Yes Wendee BeaversMcMullen, David J, DO  guaifenesin (ROBITUSSIN) 100 MG/5ML syrup Take 200 mg by mouth every 4 (four) hours as needed for cough.   Yes [provider]  levothyroxine (SYNTHROID, LEVOTHROID) 112 MCG tablet Take 112 mcg by mouth daily.    Yes [provider]  loperamide (IMODIUM) 2 MG capsule Take 2 mg by mouth as needed for diarrhea or loose stools.   Yes [provider]  memantine (NAMENDA) 10 MG tablet Take 10 mg by mouth 2 (two) times daily.   Yes [provider]  metoprolol tartrate (LOPRESSOR) 25 MG tablet Take 12.5 mg by mouth 2 (two) times daily.    Yes [provider]  Omega-3 Fatty Acids (FISH OIL) 1000 MG CAPS Take 1,000 mg by mouth daily. Do not crush   Yes [provider]  ondansetron (ZOFRAN) 4 MG tablet Take 4 mg by mouth every 8 (eight) hours as needed for nausea or vomiting.   Yes [provider]  oxyCODONE (OXY IR/ROXICODONE) 5 MG  immediate release tablet Take 1 tablet (5 mg total) by mouth every 4 (four) hours as needed for breakthrough pain. Patient taking differently: Take 7.5 mg by mouth every 4 (four) hours as needed for breakthrough pain.  05/28/17  Yes Meredeth IdeLama, Gagan S, MD  polyethylene glycol (MIRALAX / GLYCOLAX) packet Take 17 g by mouth daily. Mix in 8 oz of water and drink   Yes [provider]  senna (SENOKOT) 8.6 MG tablet Take 1 tablet by mouth 2 (two) times daily.   Yes [provider]  senna (SENOKOT) 8.6 MG tablet Take 1 tablet by mouth daily as needed for constipation.   Yes [provider]  sertraline (ZOLOFT) 100 MG tablet Take 150 mg by mouth daily.  Yes [provider]  feeding supplement, ENSURE ENLIVE, (ENSURE ENLIVE) LIQD Take 237 mLs by mouth 3 (three) times daily between meals. Patient not taking: Reported on 01/08/2018 11/26/16   Wendee Beavers, DO  senna-docusate (SENOKOT-S) 8.6-50 MG tablet Take 1 tablet by mouth 2 (two) times daily. Patient not taking: Reported on 01/09/2018 05/28/17   Meredeth Ide, MD  traZODone (DESYREL) 100 MG tablet Take 1 tablet (100 mg total) by mouth at bedtime. Patient not taking: Reported on 01/09/2018 09/30/16   Waymon Budge, MD    Family History Family History  Problem Relation Age of Onset  . Cancer Mother   . Heart disease Father   . Cancer Father     Social History Social History   Tobacco Use  . Smoking status: Never Smoker  . Smokeless tobacco: Never Used  Substance Use Topics  . Alcohol use: No  . Drug use: No     Allergies   Vicodin [hydrocodone-acetaminophen]   Review of Systems Review of Systems  Constitutional: Negative for activity change, appetite change, chills, diaphoresis, fatigue and fever.  HENT: Negative for congestion, ear discharge, ear pain, facial swelling, hearing loss, rhinorrhea, sinus pressure, sinus pain, sneezing, sore throat, tinnitus and trouble swallowing.   Eyes: Negative for  photophobia, pain, discharge, redness and itching.  Respiratory: Negative for apnea, cough, choking, chest tightness, shortness of breath and stridor.   Cardiovascular: Negative for chest pain, palpitations and leg swelling.  Gastrointestinal: Positive for vomiting. Negative for abdominal distention, abdominal pain, anal bleeding, blood in stool, constipation, diarrhea, nausea and rectal pain.  Endocrine: Negative for polydipsia, polyphagia and polyuria.  Genitourinary: Negative for difficulty urinating, dyspareunia, dysuria, enuresis, flank pain, frequency, hematuria, pelvic pain and urgency.  Musculoskeletal: Negative for arthralgias, back pain, gait problem, joint swelling, myalgias, neck pain and neck stiffness.  Skin: Negative for color change, pallor, rash and wound.  Allergic/Immunologic: Negative for environmental allergies, food allergies and immunocompromised state.  Neurological: Positive for dizziness, weakness, light-headedness and headaches. Negative for tremors, seizures, syncope, facial asymmetry, speech difficulty and numbness.  Hematological: Negative for adenopathy. Does not bruise/bleed easily.  Psychiatric/Behavioral: Negative for agitation, confusion and decreased concentration. The patient is not nervous/anxious.      Physical Exam Updated Vital Signs BP (!) 161/57   Pulse 67   Temp 98.8 F (37.1 C) (Oral)   Resp 19   Ht 5\' 3"  (1.6 m)   Wt 51.7 kg   SpO2 95%   BMI 20.19 kg/m   Physical Exam  Constitutional: She is oriented to person, place, and time. She appears well-developed and well-nourished. No distress.  HENT:  Head: Normocephalic. Head is with contusion (Right occiput ).  Eyes: Conjunctivae and EOM are normal. Pupils are equal, round, and reactive to light. Right eye exhibits no discharge. Left eye exhibits no discharge. Right conjunctiva is not injected. Right conjunctiva has no hemorrhage. Left conjunctiva is not injected. Left conjunctiva has no  hemorrhage.  Fundoscopic exam:      The right eye shows no hemorrhage and no papilledema. The right eye shows red reflex.       The left eye shows no hemorrhage and no papilledema. The left eye shows red reflex.  Neck: Trachea normal and normal range of motion. Neck supple. No JVD present. Erythema present. No tracheal deviation present. No thyromegaly present.  Cardiovascular: Normal rate, regular rhythm, normal heart sounds and intact distal pulses. Exam reveals no gallop and no friction rub.  No murmur heard.  Pulmonary/Chest: Effort normal and breath sounds normal. No stridor. No respiratory distress. She has no wheezes. She has no rales. She exhibits no tenderness.  Abdominal: Soft. Bowel sounds are normal. She exhibits no distension and no mass. There is no tenderness. There is no rebound and no guarding. No hernia.  Musculoskeletal: Normal range of motion. She exhibits tenderness (right forearm). She exhibits no edema or deformity.  Lymphadenopathy:    She has no cervical adenopathy.  Neurological: She is alert and oriented to person, place, and time. She has normal strength and normal reflexes. She displays normal reflexes. No cranial nerve deficit or sensory deficit. She exhibits normal muscle tone. Coordination normal. GCS eye subscore is 4. GCS verbal subscore is 5. GCS motor subscore is 6.  Skin: Skin is warm and dry. No rash noted. She is not diaphoretic. No erythema. No pallor.     ED Treatments / Results  Labs (all labs ordered are listed, but only abnormal results are displayed) Labs Reviewed  URINALYSIS, ROUTINE W REFLEX MICROSCOPIC - Abnormal; Notable for the following components:      Result Value   Color, Urine STRAW (*)    All other components within normal limits  CBC WITH DIFFERENTIAL/PLATELET  COMPREHENSIVE METABOLIC PANEL    EKG  EKG Interpretation None       Radiology No results found. CT scan showed no acute bleed, but did show previous ischemia and  ventricular widening and atrophy. X-ray of right forearm showed no break.   Procedures Procedures (including critical care time)  Medications Ordered in ED Medications - No data to display  Patient was given 50mg  of Tramadol for her headache and head pain.   Initial Impression / Assessment and Plan / ED Course  I have reviewed the triage vital signs and the nursing notes.  Pertinent labs & imaging results that were available during my care of the patient were reviewed by me and considered in my medical decision making (see chart for details).     ***  Final Clinical Impressions(s) / ED Diagnoses   Final diagnoses:  None    New Prescriptions New Prescriptions   No medications on file

## 2018-01-09 NOTE — ED Triage Notes (Signed)
Patient was walking with walker when she fell backwards; hematoma to occipital - denies loss of concsiousness

## 2018-01-09 NOTE — ED Provider Notes (Signed)
[facility]   CSN: [id number] Arrival date & time: 01/09/18  0447     History   Chief Complaint Chief Complaint  Patient presents with  . Fall    HPI [name] is a 77 y.o. female.  The history is provided by the patient. No language interpreter was used.  Fall  This is a new problem. The current episode started less than 1 hour ago. The problem occurs constantly. The problem has not changed since onset.Nothing aggravates the symptoms. Nothing relieves the symptoms. She has tried nothing for the symptoms. The treatment provided no relief.  Pt reports she has frequent falls.  Pt reports she slipped and fell while walking.  No syncope. Pt hit the back of her head.  Pt reports she fell and hit her right arm earlier this week.  Pt is taking plavix.   Past Medical History:  Diagnosis Date  . Abnormal heart rhythm   . Abnormality of gait 12/21/2015  . Afib (HCC)    h/o  . Anxiety   . Arthritis    "hips, shoulders" (01/05/2015)  . Back pain   . Chronic kidney disease (CKD), stage III (moderate) (HCC)    [name]/notes 01/05/2015  . Dementia   . Depression   . Echocardiogram abnormal 02/26/11   MVP,mild MR,AOV mildly sclerotic. EF >55%  . GERD (gastroesophageal reflux disease)   . Hypertension   . Hypothyroidism   . Memory difficulty 06/05/2016  . Nausea & vomiting 11/2016  . Osteoporosis   . Osteoporosis   . Reactive airway disease   . S/P right hip fracture 06/2017   No surgery required  . Scoliosis   . Sleep apnea    "suppose to wear a mask; can't tolerate it" (01/05/2015)    Patient Active Problem List   Diagnosis Date Noted  . Femur fracture, right (HCC) 05/26/2017  . Hypokalemia 05/26/2017  . Severely underweight adult 01/02/2017  . Diarrhea   . Colitis   . Dehydration 11/18/2016  . AKI (acute kidney injury) (HCC) 11/18/2016  . Abdominal pain   . Intractable vomiting with nausea   . Renal insufficiency   .  Insomnia 09/30/2016  . Memory difficulty 06/05/2016  . Abnormality of gait 12/21/2015  . Cytopenia 02/09/2015  . Protein-calorie malnutrition, severe (HCC) 01/07/2015  . Vomiting and diarrhea 01/05/2015  . Sleep walking 10/08/2014  . Restless legs 10/08/2014  . Essential hypertension 09/16/2014  . Chronic kidney disease, stage 3 (HCC) 09/16/2014  . Paroxysmal atrial tachycardia (HCC) 09/16/2014  . Hypothyroidism 09/16/2014  . Obstructive sleep apnea 07/18/2011    Past Surgical History:  Procedure Laterality Date  . CATARACT EXTRACTION, BILATERAL Bilateral   . CESAREAN SECTION  1967  . CESAREAN SECTION  1971  . DILATION AND CURETTAGE OF UTERUS    . FLEXIBLE SIGMOIDOSCOPY N/A 11/24/2016   Procedure: FLEXIBLE SIGMOIDOSCOPY;  Surgeon: Iva Booparl E Gessner, MD;  [facility];  Service: Endoscopy;  Laterality: N/A;  . JOINT REPLACEMENT    . OPEN REDUCTION INTERNAL FIXATION (ORIF) DISTAL RADIAL FRACTURE Left 12/2009   [name]/notes 12/28/2009  . TOTAL KNEE ARTHROPLASTY Left 03/2010   [name]/notes 04/07/2010  . TUBAL LIGATION    . VAGINAL HYSTERECTOMY  1980    OB History    No data available       Home Medications    Prior to Admission medications   Medication Sig Start Date End Date Taking? Authorizing Provider  acetaminophen (TYLENOL) 325 MG tablet  Take 650 mg by mouth 4 (four) times daily.    Yes [provider]  anti-nausea (EMETROL) solution Take 10 mLs by mouth every 15 (fifteen) minutes as needed for nausea or vomiting.   Yes [provider]  baclofen (LIORESAL) 10 MG tablet Take 10 mg by mouth at bedtime as needed for muscle spasms.    Yes [provider]  budesonide-formoterol (SYMBICORT) 160-4.5 MCG/ACT inhaler Inhale 2 puffs into the lungs 2 (two) times daily. Rinse mouth after use - discard 3 months after opening   Yes [provider]  calcitRIOL (ROCALTROL) 0.25 MCG capsule Take 0.25 mcg by mouth every Monday, Wednesday, and Friday. Do not crush    Yes [provider]  calcium-vitamin D (OSCAL WITH D) 250-125 MG-UNIT tablet Take 1 tablet by mouth 2 (two) times daily.   Yes [provider]  clonazePAM (KLONOPIN) 2 MG tablet Take 1 tablet (2 mg total) by mouth at bedtime. 10/24/17  Yes Young, Joni Fears D, MD  clopidogrel (PLAVIX) 75 MG tablet Take 1 tablet (75 mg total) by mouth daily. 06/05/16  Yes York Spaniel, MD  diltiazem (CARDIZEM CD) 120 MG 24 hr capsule Take 120 mg by mouth daily. 05/21/17  Yes [provider]  diltiazem (CARDIZEM) 30 MG tablet Take 30 mg by mouth at bedtime as needed (pulse greater than 110).   Yes [provider]  diphenhydrAMINE (BENADRYL) 25 MG tablet Take 25 mg by mouth every 4 (four) hours as needed for itching (minor allergic reaction).   Yes [provider]  diphenoxylate-atropine (LOMOTIL) 2.5-0.025 MG tablet Take 1 tablet by mouth 3 (three) times daily as needed for diarrhea or loose stools. 11/26/16  Yes Wendee Beavers, DO  guaifenesin (ROBITUSSIN) 100 MG/5ML syrup Take 200 mg by mouth every 4 (four) hours as needed for cough.   Yes [provider]  levothyroxine (SYNTHROID, LEVOTHROID) 112 MCG tablet Take 112 mcg by mouth daily.    Yes [provider]  loperamide (IMODIUM) 2 MG capsule Take 2 mg by mouth as needed for diarrhea or loose stools.   Yes [provider]  memantine (NAMENDA) 10 MG tablet Take 10 mg by mouth 2 (two) times daily.   Yes [provider]  metoprolol tartrate (LOPRESSOR) 25 MG tablet Take 12.5 mg by mouth 2 (two) times daily.    Yes [provider]  Omega-3 Fatty Acids (FISH OIL) 1000 MG CAPS Take 1,000 mg by mouth daily. Do not crush   Yes [provider]  ondansetron (ZOFRAN) 4 MG tablet Take 4 mg by mouth every 8 (eight) hours as needed for nausea or vomiting.   Yes [provider]  oxyCODONE (OXY IR/ROXICODONE) 5 MG immediate release tablet Take 1 tablet (5 mg total) by mouth  every 4 (four) hours as needed for breakthrough pain. Patient taking differently: Take 7.5 mg by mouth every 4 (four) hours as needed for breakthrough pain.  05/28/17  Yes Meredeth Ide, MD  polyethylene glycol (MIRALAX / GLYCOLAX) packet Take 17 g by mouth daily. Mix in 8 oz of water and drink   Yes [provider]  senna (SENOKOT) 8.6 MG tablet Take 1 tablet by mouth 2 (two) times daily.   Yes [provider]  senna (SENOKOT) 8.6 MG tablet Take 1 tablet by mouth daily as needed for constipation.   Yes [provider]  sertraline (ZOLOFT) 100 MG tablet Take 150 mg by mouth daily.   Yes [provider]  feeding supplement, ENSURE ENLIVE, (ENSURE ENLIVE) LIQD Take 237 mLs by mouth 3 (three) times daily between meals. Patient not taking: Reported on 01/08/2018 11/26/16   Wendee Beavers, DO  senna-docusate (SENOKOT-S) 8.6-50 MG tablet Take 1 tablet by mouth 2 (two) times daily. Patient not taking: Reported on 01/09/2018 05/28/17   Meredeth Ide, MD  traZODone (DESYREL) 100 MG tablet Take 1 tablet (100 mg total) by mouth at bedtime. Patient not taking: Reported on 01/09/2018 09/30/16   Waymon Budge, MD    Family History Family History  Problem Relation Age of Onset  . Cancer Mother   . Heart disease Father   . Cancer Father     Social History Social History   Tobacco Use  . Smoking status: Never Smoker  . Smokeless tobacco: Never Used  Substance Use Topics  . Alcohol use: No  . Drug use: No     Allergies   Vicodin [hydrocodone-acetaminophen]   Review of Systems Review of Systems  All other systems reviewed and are negative.    Physical Exam Updated Vital Signs BP (!) 151/73   Pulse 67   Temp 98.8 F (37.1 C) (Oral)   Resp 20   Ht 5\' 3"  (1.6 m)   Wt 51.7 kg (114 lb)   SpO2 97%   BMI 20.19 kg/m   Physical Exam  Constitutional: She appears well-developed and well-nourished. No distress.  HENT:  Head: Normocephalic.  Right Ear:  External ear normal.  Left Ear: External ear normal.  Mouth/Throat: Oropharynx is clear and moist.  Swollen tender scalp, no bleeding,    Eyes: Conjunctivae and EOM are normal. Pupils are equal, round, and reactive to light.  Neck: Neck supple.  Cardiovascular: Normal rate and regular rhythm.  No murmur heard. Pulmonary/Chest: Effort normal and breath sounds normal. No respiratory distress.  Abdominal: Soft. There is no tenderness.  Musculoskeletal: She exhibits no edema.  Neurological: She is alert.  Skin: Skin is warm and dry.  Psychiatric: She has a normal mood and affect.  Nursing note and vitals reviewed.    ED Treatments / Results  Labs (all labs ordered are listed, but only abnormal results are displayed) Labs Reviewed  URINALYSIS, ROUTINE W REFLEX MICROSCOPIC - Abnormal; Notable for the following components:      Result Value   Color, Urine STRAW (*)    All other components within normal limits  CBC WITH DIFFERENTIAL/PLATELET - Abnormal; Notable for the following components:   WBC 3.9 (*)    RBC 3.86 (*)    MCV 100.3 (*)    Platelets 139 (*)    All other components within normal limits  COMPREHENSIVE METABOLIC PANEL - Abnormal; Notable for the following components:   Potassium 3.3 (*)    Creatinine, Ser 1.46 (*)    GFR calc non Af Amer 34 (*)    GFR calc Af Amer 40 (*)    All other components within normal limits    EKG  EKG Interpretation None       Radiology Dg Forearm Right  Result Date: 01/09/2018 CLINICAL DATA:  Fall, landing on right arm. EXAM: RIGHT FOREARM - 2 VIEW COMPARISON:  09/20/2010 FINDINGS: No acute bony abnormality. Specifically, no fracture, subluxation, or dislocation. IMPRESSION: Negative. Electronically Signed   By: Charlett Nose M.D.   On: 01/09/2018 07:30   Ct Head Wo Contrast  Result Date: 01/09/2018 CLINICAL DATA:  Fall EXAM: CT HEAD WITHOUT CONTRAST TECHNIQUE: Contiguous axial images were obtained from the base  of the skull  through the vertex without intravenous contrast. COMPARISON:  None. FINDINGS: Brain: There is atrophy and chronic small vessel disease changes. No acute intracranial abnormality. Specifically, no hemorrhage, hydrocephalus, mass lesion, acute infarction, or significant intracranial injury. Vascular: No hyperdense vessel or unexpected calcification. Skull: No acute calvarial abnormality. Sinuses/Orbits: Visualized paranasal sinuses and mastoids clear. Orbital soft tissues unremarkable. Other: Soft tissue swelling over the right parietal region. IMPRESSION: No acute intracranial abnormality. Atrophy, chronic microvascular disease. Electronically Signed   By: Charlett Nose M.D.   On: 01/09/2018 07:29    Procedures Procedures (including critical care time)  Medications Ordered in ED Medications  acetaminophen (TYLENOL) tablet 650 mg (650 mg Oral Given 01/09/18 0832)  traMADol (ULTRAM) tablet 50 mg (50 mg Oral Given 01/09/18 0832)  ondansetron (ZOFRAN-ODT) disintegrating tablet 4 mg (4 mg Oral Given 01/09/18 1610)     Initial Impression / Assessment and Plan / ED Course  I have reviewed the triage vital signs and the nursing notes.  Pertinent labs & imaging results that were available during my care of the patient were reviewed by me and considered in my medical decision making (see chart for details).     MDM  Ct scan and xray forearm reviewed,  No acute injury.  Pt counseled about assistance with walking.  Pt given tylenol for pain.  Pt lives in a nursing home.   Final Clinical Impressions(s) / ED Diagnoses   Final diagnoses:  Contusion of scalp, initial encounter    ED Discharge Orders    None       Elson Areas, [place] 01/09/18 9604    Jacalyn Lefevre, MD 01/09/18 1158

## 2018-01-15 ENCOUNTER — Ambulatory Visit (INDEPENDENT_AMBULATORY_CARE_PROVIDER_SITE_OTHER): Payer: Medicare Other | Admitting: Internal Medicine

## 2018-01-15 ENCOUNTER — Encounter: Payer: Self-pay | Admitting: Internal Medicine

## 2018-01-15 VITALS — BP 114/68 | HR 53 | Ht 63.0 in | Wt 111.8 lb

## 2018-01-15 DIAGNOSIS — F513 Sleepwalking [somnambulism]: Secondary | ICD-10-CM

## 2018-01-15 DIAGNOSIS — G4733 Obstructive sleep apnea (adult) (pediatric): Secondary | ICD-10-CM

## 2018-01-15 DIAGNOSIS — F5101 Primary insomnia: Secondary | ICD-10-CM

## 2018-01-15 NOTE — Assessment & Plan Note (Signed)
With her history of falls, this needs to be watched closely

## 2018-01-15 NOTE — Patient Instructions (Addendum)
Order- unattended home sleep test    Dx OSA  Please call me about 2 weeks after the flu shot to see if we can stop CPAP

## 2018-01-15 NOTE — Assessment & Plan Note (Signed)
She feels she cannot sleep at all without clonazepam.  Obvious concerns at her age. Plan-update sleep study

## 2018-01-15 NOTE — Assessment & Plan Note (Signed)
Weight loss may mean she no longer needs CPAP.  There has been a break in therapy.  Before we actually quit it, I suggested we recheck for sleep apnea and nocturnal hypoxemia with a home sleep test.

## 2018-01-15 NOTE — Progress Notes (Signed)
Patient ID: [name], female    DOB: 06-16-1941, 77 y.o.   MRN: [id number] HPI  female never smoker followed for OSA, insomnia, restless legs, complicated by PAT, stage III kidney disease, HBP. NPSG 09/18/05- AHI 19.8/ hr  --------------------------------------------------------------------------------------------------------  04/22/17- 77 year old female never smoker followed for OSA, insomnia, restless legs, complicated by PAT, stage III kidney disease, HBP CPAP 9/[facility]->> 10 today FOLLOW UP FOR  OSA DME  is  [facility]  patient wears CPAP every night patient sleep about 6 or 8 hours a night with her CPAP machine on  No download available at this visit. She says sleep quality is much improved now. Insomnia used to be a major issue. Limb movement sleep disturbance controlled by clonazepam. She is also on antidepressants. She describes excellent compliance and control by CPAP with no snore through. Mask fits well. Machine is working well.  01/15/18- 77 year old female never smoker followed for OSA, insomnia, restless legs, complicated by PAT, stage III kidney disease, HBP CPAP 10/[facility] ----OSA. DME-[facility].Wears CPAP avg. 4 hrs. each night. Not using CPAP much recently.  After a series of falls she has had contusion on her scalp and sutured laceration on her arm which have made CPAP mask uncomfortable.  Attendant with her reports if she is noted sleepy in the daytime, they do not hear snoring.  [name] has lost weight since her original sleep study. Says she cannot sleep without clonazepam.  With some sleepwalking Feels she needs to continue Symbicort with occasional use of rescue inhaler.  No active respiratory daytime symptoms now.  Up-to-date flu and pneumonia vaccines.  Review of Systems-see HPI + = positive Constitutional:   No-   weight loss, night sweats, fevers, chills, fatigue, lassitude. HEENT:   + headaches, No-difficulty swallowing, tooth/dental problems, sore throat,       No-   sneezing, itching, ear ache, nasal congestion, post nasal drip,  CV:  No-   chest pain, orthopnea, PND, swelling in lower extremities, anasarca, Dizziness,+ palpitations Resp: No-   shortness of breath with exertion or at rest.              No-   productive cough,  No non-productive cough,  No-  coughing up of blood.              No-   change in color of mucus.  No- wheezing.   Skin: No-   rash or lesions. GI:  No-   heartburn, indigestion, abdominal pain, nausea, vomiting,  GU:  MS:  +  joint pain or swelling.  Neuro- as per HPI  Psych:  No- change in mood or affect. + depression and anxiety.  No memory loss.  Objective:   Physical Exam General- Alert, Oriented, Affect-appropriate, oriented and calm,  Distress- none acute. +Looks thinner Skin- rash-none, lesions- none, excoriation- none Lymphadenopathy- none Head-+ facial bruising around right eye            Eyes- Gross vision intact, PERRLA, conjunctivae clear secretions            Ears- Hearing, canals-normal            Nose- Clear, no-Septal dev, mucus, polyps, erosion, perforation             Throat- Mallampati IV , mucosa clear , drainage- none, tonsils- atrophic, dentures Neck- flexible , trachea midline, no stridor , thyroid nl, carotid no bruit Chest - symmetrical excursion , unlabored           Heart/CV-occasional skipped beat +,  no murmur , no gallop  , no rub, nl s1 s2                           - JVD- none , edema- none, stasis changes- none, varices- none           Lung- clear to P&A, wheeze- none, cough -None , dullness-none, rub- none           Chest wall-  Abd- Br/ Gen/ Rectal- Not done, not indicated Extrem- cyanosis- none, clubbing, none, atrophy- none, strength- nl. + Rolling walker,                   + dressing right arm Neuro- grossly intact to observation

## 2018-01-16 DIAGNOSIS — G4733 Obstructive sleep apnea (adult) (pediatric): Secondary | ICD-10-CM | POA: Diagnosis not present

## 2018-01-16 DIAGNOSIS — M160 Bilateral primary osteoarthritis of hip: Secondary | ICD-10-CM | POA: Diagnosis not present

## 2018-01-16 DIAGNOSIS — I4891 Unspecified atrial fibrillation: Secondary | ICD-10-CM | POA: Diagnosis not present

## 2018-01-16 DIAGNOSIS — M419 Scoliosis, unspecified: Secondary | ICD-10-CM | POA: Diagnosis not present

## 2018-01-16 DIAGNOSIS — G8929 Other chronic pain: Secondary | ICD-10-CM | POA: Diagnosis not present

## 2018-01-16 DIAGNOSIS — E785 Hyperlipidemia, unspecified: Secondary | ICD-10-CM | POA: Diagnosis not present

## 2018-01-16 DIAGNOSIS — M19011 Primary osteoarthritis, right shoulder: Secondary | ICD-10-CM | POA: Diagnosis not present

## 2018-01-16 DIAGNOSIS — R9082 White matter disease, unspecified: Secondary | ICD-10-CM | POA: Diagnosis not present

## 2018-01-16 DIAGNOSIS — I129 Hypertensive chronic kidney disease with stage 1 through stage 4 chronic kidney disease, or unspecified chronic kidney disease: Secondary | ICD-10-CM | POA: Diagnosis not present

## 2018-01-16 DIAGNOSIS — N183 Chronic kidney disease, stage 3 (moderate): Secondary | ICD-10-CM | POA: Diagnosis not present

## 2018-01-16 DIAGNOSIS — E039 Hypothyroidism, unspecified: Secondary | ICD-10-CM | POA: Diagnosis not present

## 2018-01-16 DIAGNOSIS — D631 Anemia in chronic kidney disease: Secondary | ICD-10-CM | POA: Diagnosis not present

## 2018-01-16 DIAGNOSIS — M19012 Primary osteoarthritis, left shoulder: Secondary | ICD-10-CM | POA: Diagnosis not present

## 2018-01-16 DIAGNOSIS — M810 Age-related osteoporosis without current pathological fracture: Secondary | ICD-10-CM | POA: Diagnosis not present

## 2018-01-16 DIAGNOSIS — J45909 Unspecified asthma, uncomplicated: Secondary | ICD-10-CM | POA: Diagnosis not present

## 2018-01-18 DIAGNOSIS — R9431 Abnormal electrocardiogram [ECG] [EKG]: Secondary | ICD-10-CM | POA: Diagnosis not present

## 2018-01-18 DIAGNOSIS — J181 Lobar pneumonia, unspecified organism: Secondary | ICD-10-CM | POA: Diagnosis not present

## 2018-01-18 DIAGNOSIS — Z7902 Long term (current) use of antithrombotics/antiplatelets: Secondary | ICD-10-CM | POA: Diagnosis not present

## 2018-01-18 DIAGNOSIS — R739 Hyperglycemia, unspecified: Secondary | ICD-10-CM | POA: Diagnosis not present

## 2018-01-18 DIAGNOSIS — Z79891 Long term (current) use of opiate analgesic: Secondary | ICD-10-CM | POA: Diagnosis not present

## 2018-01-18 DIAGNOSIS — R05 Cough: Secondary | ICD-10-CM | POA: Diagnosis not present

## 2018-01-18 DIAGNOSIS — J440 Chronic obstructive pulmonary disease with acute lower respiratory infection: Secondary | ICD-10-CM | POA: Diagnosis not present

## 2018-01-18 DIAGNOSIS — R509 Fever, unspecified: Secondary | ICD-10-CM | POA: Diagnosis not present

## 2018-01-18 DIAGNOSIS — J189 Pneumonia, unspecified organism: Secondary | ICD-10-CM | POA: Diagnosis not present

## 2018-01-18 DIAGNOSIS — R0602 Shortness of breath: Secondary | ICD-10-CM | POA: Diagnosis not present

## 2018-01-18 DIAGNOSIS — N183 Chronic kidney disease, stage 3 (moderate): Secondary | ICD-10-CM | POA: Diagnosis not present

## 2018-01-18 DIAGNOSIS — Z79899 Other long term (current) drug therapy: Secondary | ICD-10-CM | POA: Diagnosis not present

## 2018-01-18 DIAGNOSIS — Z8673 Personal history of transient ischemic attack (TIA), and cerebral infarction without residual deficits: Secondary | ICD-10-CM | POA: Diagnosis not present

## 2018-01-19 ENCOUNTER — Encounter: Payer: Self-pay | Admitting: Neurology

## 2018-01-19 ENCOUNTER — Telehealth: Payer: Self-pay | Admitting: Neurology

## 2018-01-19 ENCOUNTER — Ambulatory Visit: Payer: Self-pay | Admitting: Neurology

## 2018-01-19 DIAGNOSIS — N183 Chronic kidney disease, stage 3 (moderate): Secondary | ICD-10-CM | POA: Diagnosis not present

## 2018-01-19 DIAGNOSIS — R9431 Abnormal electrocardiogram [ECG] [EKG]: Secondary | ICD-10-CM | POA: Diagnosis not present

## 2018-01-19 DIAGNOSIS — R739 Hyperglycemia, unspecified: Secondary | ICD-10-CM | POA: Diagnosis not present

## 2018-01-19 DIAGNOSIS — R0602 Shortness of breath: Secondary | ICD-10-CM | POA: Diagnosis not present

## 2018-01-19 DIAGNOSIS — J189 Pneumonia, unspecified organism: Secondary | ICD-10-CM | POA: Diagnosis not present

## 2018-01-19 NOTE — Telephone Encounter (Signed)
This patient no showed for a revisit appointment today. 

## 2018-01-20 DIAGNOSIS — R739 Hyperglycemia, unspecified: Secondary | ICD-10-CM | POA: Diagnosis not present

## 2018-01-20 DIAGNOSIS — R9431 Abnormal electrocardiogram [ECG] [EKG]: Secondary | ICD-10-CM | POA: Diagnosis not present

## 2018-01-20 DIAGNOSIS — N183 Chronic kidney disease, stage 3 (moderate): Secondary | ICD-10-CM | POA: Diagnosis not present

## 2018-01-20 DIAGNOSIS — J189 Pneumonia, unspecified organism: Secondary | ICD-10-CM | POA: Diagnosis not present

## 2018-01-22 DIAGNOSIS — R9082 White matter disease, unspecified: Secondary | ICD-10-CM | POA: Diagnosis not present

## 2018-01-22 DIAGNOSIS — J45909 Unspecified asthma, uncomplicated: Secondary | ICD-10-CM | POA: Diagnosis not present

## 2018-01-22 DIAGNOSIS — M19011 Primary osteoarthritis, right shoulder: Secondary | ICD-10-CM | POA: Diagnosis not present

## 2018-01-22 DIAGNOSIS — M810 Age-related osteoporosis without current pathological fracture: Secondary | ICD-10-CM | POA: Diagnosis not present

## 2018-01-22 DIAGNOSIS — M419 Scoliosis, unspecified: Secondary | ICD-10-CM | POA: Diagnosis not present

## 2018-01-22 DIAGNOSIS — I4891 Unspecified atrial fibrillation: Secondary | ICD-10-CM | POA: Diagnosis not present

## 2018-01-22 DIAGNOSIS — G8929 Other chronic pain: Secondary | ICD-10-CM | POA: Diagnosis not present

## 2018-01-22 DIAGNOSIS — D631 Anemia in chronic kidney disease: Secondary | ICD-10-CM | POA: Diagnosis not present

## 2018-01-22 DIAGNOSIS — M19012 Primary osteoarthritis, left shoulder: Secondary | ICD-10-CM | POA: Diagnosis not present

## 2018-01-22 DIAGNOSIS — N183 Chronic kidney disease, stage 3 (moderate): Secondary | ICD-10-CM | POA: Diagnosis not present

## 2018-01-22 DIAGNOSIS — E785 Hyperlipidemia, unspecified: Secondary | ICD-10-CM | POA: Diagnosis not present

## 2018-01-22 DIAGNOSIS — E039 Hypothyroidism, unspecified: Secondary | ICD-10-CM | POA: Diagnosis not present

## 2018-01-22 DIAGNOSIS — M160 Bilateral primary osteoarthritis of hip: Secondary | ICD-10-CM | POA: Diagnosis not present

## 2018-01-22 DIAGNOSIS — G4733 Obstructive sleep apnea (adult) (pediatric): Secondary | ICD-10-CM | POA: Diagnosis not present

## 2018-01-22 DIAGNOSIS — I129 Hypertensive chronic kidney disease with stage 1 through stage 4 chronic kidney disease, or unspecified chronic kidney disease: Secondary | ICD-10-CM | POA: Diagnosis not present

## 2018-01-23 DIAGNOSIS — J449 Chronic obstructive pulmonary disease, unspecified: Secondary | ICD-10-CM | POA: Diagnosis not present

## 2018-01-26 DIAGNOSIS — G4733 Obstructive sleep apnea (adult) (pediatric): Secondary | ICD-10-CM | POA: Diagnosis not present

## 2018-01-26 DIAGNOSIS — I4891 Unspecified atrial fibrillation: Secondary | ICD-10-CM | POA: Diagnosis not present

## 2018-01-26 DIAGNOSIS — I129 Hypertensive chronic kidney disease with stage 1 through stage 4 chronic kidney disease, or unspecified chronic kidney disease: Secondary | ICD-10-CM | POA: Diagnosis not present

## 2018-01-26 DIAGNOSIS — E039 Hypothyroidism, unspecified: Secondary | ICD-10-CM | POA: Diagnosis not present

## 2018-01-26 DIAGNOSIS — G8929 Other chronic pain: Secondary | ICD-10-CM | POA: Diagnosis not present

## 2018-01-26 DIAGNOSIS — J45909 Unspecified asthma, uncomplicated: Secondary | ICD-10-CM | POA: Diagnosis not present

## 2018-01-26 DIAGNOSIS — D631 Anemia in chronic kidney disease: Secondary | ICD-10-CM | POA: Diagnosis not present

## 2018-01-26 DIAGNOSIS — M419 Scoliosis, unspecified: Secondary | ICD-10-CM | POA: Diagnosis not present

## 2018-01-26 DIAGNOSIS — M160 Bilateral primary osteoarthritis of hip: Secondary | ICD-10-CM | POA: Diagnosis not present

## 2018-01-26 DIAGNOSIS — N183 Chronic kidney disease, stage 3 (moderate): Secondary | ICD-10-CM | POA: Diagnosis not present

## 2018-01-26 DIAGNOSIS — M19012 Primary osteoarthritis, left shoulder: Secondary | ICD-10-CM | POA: Diagnosis not present

## 2018-01-26 DIAGNOSIS — E785 Hyperlipidemia, unspecified: Secondary | ICD-10-CM | POA: Diagnosis not present

## 2018-01-26 DIAGNOSIS — M810 Age-related osteoporosis without current pathological fracture: Secondary | ICD-10-CM | POA: Diagnosis not present

## 2018-01-26 DIAGNOSIS — R9082 White matter disease, unspecified: Secondary | ICD-10-CM | POA: Diagnosis not present

## 2018-01-26 DIAGNOSIS — M19011 Primary osteoarthritis, right shoulder: Secondary | ICD-10-CM | POA: Diagnosis not present

## 2018-01-27 DIAGNOSIS — N183 Chronic kidney disease, stage 3 (moderate): Secondary | ICD-10-CM | POA: Diagnosis not present

## 2018-01-27 DIAGNOSIS — M160 Bilateral primary osteoarthritis of hip: Secondary | ICD-10-CM | POA: Diagnosis not present

## 2018-01-27 DIAGNOSIS — E785 Hyperlipidemia, unspecified: Secondary | ICD-10-CM | POA: Diagnosis not present

## 2018-01-27 DIAGNOSIS — M19011 Primary osteoarthritis, right shoulder: Secondary | ICD-10-CM | POA: Diagnosis not present

## 2018-01-27 DIAGNOSIS — E039 Hypothyroidism, unspecified: Secondary | ICD-10-CM | POA: Diagnosis not present

## 2018-01-27 DIAGNOSIS — R9082 White matter disease, unspecified: Secondary | ICD-10-CM | POA: Diagnosis not present

## 2018-01-27 DIAGNOSIS — I4891 Unspecified atrial fibrillation: Secondary | ICD-10-CM | POA: Diagnosis not present

## 2018-01-27 DIAGNOSIS — M19012 Primary osteoarthritis, left shoulder: Secondary | ICD-10-CM | POA: Diagnosis not present

## 2018-01-27 DIAGNOSIS — G8929 Other chronic pain: Secondary | ICD-10-CM | POA: Diagnosis not present

## 2018-01-27 DIAGNOSIS — M419 Scoliosis, unspecified: Secondary | ICD-10-CM | POA: Diagnosis not present

## 2018-01-27 DIAGNOSIS — G4733 Obstructive sleep apnea (adult) (pediatric): Secondary | ICD-10-CM | POA: Diagnosis not present

## 2018-01-27 DIAGNOSIS — J45909 Unspecified asthma, uncomplicated: Secondary | ICD-10-CM | POA: Diagnosis not present

## 2018-01-27 DIAGNOSIS — I129 Hypertensive chronic kidney disease with stage 1 through stage 4 chronic kidney disease, or unspecified chronic kidney disease: Secondary | ICD-10-CM | POA: Diagnosis not present

## 2018-01-27 DIAGNOSIS — D631 Anemia in chronic kidney disease: Secondary | ICD-10-CM | POA: Diagnosis not present

## 2018-01-27 DIAGNOSIS — M810 Age-related osteoporosis without current pathological fracture: Secondary | ICD-10-CM | POA: Diagnosis not present

## 2018-01-30 DIAGNOSIS — E785 Hyperlipidemia, unspecified: Secondary | ICD-10-CM | POA: Diagnosis not present

## 2018-01-30 DIAGNOSIS — R9082 White matter disease, unspecified: Secondary | ICD-10-CM | POA: Diagnosis not present

## 2018-01-30 DIAGNOSIS — E039 Hypothyroidism, unspecified: Secondary | ICD-10-CM | POA: Diagnosis not present

## 2018-01-30 DIAGNOSIS — M419 Scoliosis, unspecified: Secondary | ICD-10-CM | POA: Diagnosis not present

## 2018-01-30 DIAGNOSIS — M160 Bilateral primary osteoarthritis of hip: Secondary | ICD-10-CM | POA: Diagnosis not present

## 2018-01-30 DIAGNOSIS — J45909 Unspecified asthma, uncomplicated: Secondary | ICD-10-CM | POA: Diagnosis not present

## 2018-01-30 DIAGNOSIS — M19012 Primary osteoarthritis, left shoulder: Secondary | ICD-10-CM | POA: Diagnosis not present

## 2018-01-30 DIAGNOSIS — G4733 Obstructive sleep apnea (adult) (pediatric): Secondary | ICD-10-CM | POA: Diagnosis not present

## 2018-01-30 DIAGNOSIS — M19011 Primary osteoarthritis, right shoulder: Secondary | ICD-10-CM | POA: Diagnosis not present

## 2018-01-30 DIAGNOSIS — N183 Chronic kidney disease, stage 3 (moderate): Secondary | ICD-10-CM | POA: Diagnosis not present

## 2018-01-30 DIAGNOSIS — I4891 Unspecified atrial fibrillation: Secondary | ICD-10-CM | POA: Diagnosis not present

## 2018-01-30 DIAGNOSIS — G8929 Other chronic pain: Secondary | ICD-10-CM | POA: Diagnosis not present

## 2018-01-30 DIAGNOSIS — M810 Age-related osteoporosis without current pathological fracture: Secondary | ICD-10-CM | POA: Diagnosis not present

## 2018-01-30 DIAGNOSIS — I129 Hypertensive chronic kidney disease with stage 1 through stage 4 chronic kidney disease, or unspecified chronic kidney disease: Secondary | ICD-10-CM | POA: Diagnosis not present

## 2018-01-30 DIAGNOSIS — D631 Anemia in chronic kidney disease: Secondary | ICD-10-CM | POA: Diagnosis not present

## 2018-02-03 DIAGNOSIS — G4733 Obstructive sleep apnea (adult) (pediatric): Secondary | ICD-10-CM | POA: Diagnosis not present

## 2018-02-03 DIAGNOSIS — M810 Age-related osteoporosis without current pathological fracture: Secondary | ICD-10-CM | POA: Diagnosis not present

## 2018-02-03 DIAGNOSIS — G8929 Other chronic pain: Secondary | ICD-10-CM | POA: Diagnosis not present

## 2018-02-03 DIAGNOSIS — D631 Anemia in chronic kidney disease: Secondary | ICD-10-CM | POA: Diagnosis not present

## 2018-02-03 DIAGNOSIS — M419 Scoliosis, unspecified: Secondary | ICD-10-CM | POA: Diagnosis not present

## 2018-02-03 DIAGNOSIS — M160 Bilateral primary osteoarthritis of hip: Secondary | ICD-10-CM | POA: Diagnosis not present

## 2018-02-03 DIAGNOSIS — R9082 White matter disease, unspecified: Secondary | ICD-10-CM | POA: Diagnosis not present

## 2018-02-03 DIAGNOSIS — I4891 Unspecified atrial fibrillation: Secondary | ICD-10-CM | POA: Diagnosis not present

## 2018-02-03 DIAGNOSIS — M19012 Primary osteoarthritis, left shoulder: Secondary | ICD-10-CM | POA: Diagnosis not present

## 2018-02-03 DIAGNOSIS — E785 Hyperlipidemia, unspecified: Secondary | ICD-10-CM | POA: Diagnosis not present

## 2018-02-03 DIAGNOSIS — J45909 Unspecified asthma, uncomplicated: Secondary | ICD-10-CM | POA: Diagnosis not present

## 2018-02-03 DIAGNOSIS — E039 Hypothyroidism, unspecified: Secondary | ICD-10-CM | POA: Diagnosis not present

## 2018-02-03 DIAGNOSIS — I129 Hypertensive chronic kidney disease with stage 1 through stage 4 chronic kidney disease, or unspecified chronic kidney disease: Secondary | ICD-10-CM | POA: Diagnosis not present

## 2018-02-03 DIAGNOSIS — N183 Chronic kidney disease, stage 3 (moderate): Secondary | ICD-10-CM | POA: Diagnosis not present

## 2018-02-03 DIAGNOSIS — M19011 Primary osteoarthritis, right shoulder: Secondary | ICD-10-CM | POA: Diagnosis not present

## 2018-02-04 DIAGNOSIS — M19011 Primary osteoarthritis, right shoulder: Secondary | ICD-10-CM | POA: Diagnosis not present

## 2018-02-04 DIAGNOSIS — I4891 Unspecified atrial fibrillation: Secondary | ICD-10-CM | POA: Diagnosis not present

## 2018-02-04 DIAGNOSIS — G4733 Obstructive sleep apnea (adult) (pediatric): Secondary | ICD-10-CM | POA: Diagnosis not present

## 2018-02-04 DIAGNOSIS — J45909 Unspecified asthma, uncomplicated: Secondary | ICD-10-CM | POA: Diagnosis not present

## 2018-02-04 DIAGNOSIS — Z8669 Personal history of other diseases of the nervous system and sense organs: Secondary | ICD-10-CM | POA: Diagnosis not present

## 2018-02-04 DIAGNOSIS — N183 Chronic kidney disease, stage 3 (moderate): Secondary | ICD-10-CM | POA: Diagnosis not present

## 2018-02-04 DIAGNOSIS — D631 Anemia in chronic kidney disease: Secondary | ICD-10-CM | POA: Diagnosis not present

## 2018-02-04 DIAGNOSIS — I129 Hypertensive chronic kidney disease with stage 1 through stage 4 chronic kidney disease, or unspecified chronic kidney disease: Secondary | ICD-10-CM | POA: Diagnosis not present

## 2018-02-04 DIAGNOSIS — M419 Scoliosis, unspecified: Secondary | ICD-10-CM | POA: Diagnosis not present

## 2018-02-04 DIAGNOSIS — R9082 White matter disease, unspecified: Secondary | ICD-10-CM | POA: Diagnosis not present

## 2018-02-04 DIAGNOSIS — M19012 Primary osteoarthritis, left shoulder: Secondary | ICD-10-CM | POA: Diagnosis not present

## 2018-02-04 DIAGNOSIS — M810 Age-related osteoporosis without current pathological fracture: Secondary | ICD-10-CM | POA: Diagnosis not present

## 2018-02-04 DIAGNOSIS — G8929 Other chronic pain: Secondary | ICD-10-CM | POA: Diagnosis not present

## 2018-02-04 DIAGNOSIS — Z87898 Personal history of other specified conditions: Secondary | ICD-10-CM | POA: Diagnosis not present

## 2018-02-04 DIAGNOSIS — M160 Bilateral primary osteoarthritis of hip: Secondary | ICD-10-CM | POA: Diagnosis not present

## 2018-02-04 DIAGNOSIS — E039 Hypothyroidism, unspecified: Secondary | ICD-10-CM | POA: Diagnosis not present

## 2018-02-04 DIAGNOSIS — E785 Hyperlipidemia, unspecified: Secondary | ICD-10-CM | POA: Diagnosis not present

## 2018-02-05 DIAGNOSIS — M19012 Primary osteoarthritis, left shoulder: Secondary | ICD-10-CM | POA: Diagnosis not present

## 2018-02-05 DIAGNOSIS — M160 Bilateral primary osteoarthritis of hip: Secondary | ICD-10-CM | POA: Diagnosis not present

## 2018-02-05 DIAGNOSIS — N183 Chronic kidney disease, stage 3 (moderate): Secondary | ICD-10-CM | POA: Diagnosis not present

## 2018-02-05 DIAGNOSIS — M419 Scoliosis, unspecified: Secondary | ICD-10-CM | POA: Diagnosis not present

## 2018-02-05 DIAGNOSIS — D631 Anemia in chronic kidney disease: Secondary | ICD-10-CM | POA: Diagnosis not present

## 2018-02-05 DIAGNOSIS — J45909 Unspecified asthma, uncomplicated: Secondary | ICD-10-CM | POA: Diagnosis not present

## 2018-02-05 DIAGNOSIS — I129 Hypertensive chronic kidney disease with stage 1 through stage 4 chronic kidney disease, or unspecified chronic kidney disease: Secondary | ICD-10-CM | POA: Diagnosis not present

## 2018-02-05 DIAGNOSIS — G4733 Obstructive sleep apnea (adult) (pediatric): Secondary | ICD-10-CM | POA: Diagnosis not present

## 2018-02-05 DIAGNOSIS — G8929 Other chronic pain: Secondary | ICD-10-CM | POA: Diagnosis not present

## 2018-02-05 DIAGNOSIS — I4891 Unspecified atrial fibrillation: Secondary | ICD-10-CM | POA: Diagnosis not present

## 2018-02-05 DIAGNOSIS — E039 Hypothyroidism, unspecified: Secondary | ICD-10-CM | POA: Diagnosis not present

## 2018-02-05 DIAGNOSIS — E785 Hyperlipidemia, unspecified: Secondary | ICD-10-CM | POA: Diagnosis not present

## 2018-02-05 DIAGNOSIS — M810 Age-related osteoporosis without current pathological fracture: Secondary | ICD-10-CM | POA: Diagnosis not present

## 2018-02-05 DIAGNOSIS — M19011 Primary osteoarthritis, right shoulder: Secondary | ICD-10-CM | POA: Diagnosis not present

## 2018-02-05 DIAGNOSIS — R9082 White matter disease, unspecified: Secondary | ICD-10-CM | POA: Diagnosis not present

## 2018-02-06 DIAGNOSIS — Z0000 Encounter for general adult medical examination without abnormal findings: Secondary | ICD-10-CM | POA: Diagnosis not present

## 2018-02-10 DIAGNOSIS — J45909 Unspecified asthma, uncomplicated: Secondary | ICD-10-CM | POA: Diagnosis not present

## 2018-02-10 DIAGNOSIS — M810 Age-related osteoporosis without current pathological fracture: Secondary | ICD-10-CM | POA: Diagnosis not present

## 2018-02-10 DIAGNOSIS — G8929 Other chronic pain: Secondary | ICD-10-CM | POA: Diagnosis not present

## 2018-02-10 DIAGNOSIS — G4733 Obstructive sleep apnea (adult) (pediatric): Secondary | ICD-10-CM | POA: Diagnosis not present

## 2018-02-10 DIAGNOSIS — E039 Hypothyroidism, unspecified: Secondary | ICD-10-CM | POA: Diagnosis not present

## 2018-02-10 DIAGNOSIS — M160 Bilateral primary osteoarthritis of hip: Secondary | ICD-10-CM | POA: Diagnosis not present

## 2018-02-10 DIAGNOSIS — N183 Chronic kidney disease, stage 3 (moderate): Secondary | ICD-10-CM | POA: Diagnosis not present

## 2018-02-10 DIAGNOSIS — E785 Hyperlipidemia, unspecified: Secondary | ICD-10-CM | POA: Diagnosis not present

## 2018-02-10 DIAGNOSIS — I129 Hypertensive chronic kidney disease with stage 1 through stage 4 chronic kidney disease, or unspecified chronic kidney disease: Secondary | ICD-10-CM | POA: Diagnosis not present

## 2018-02-10 DIAGNOSIS — M19012 Primary osteoarthritis, left shoulder: Secondary | ICD-10-CM | POA: Diagnosis not present

## 2018-02-10 DIAGNOSIS — D631 Anemia in chronic kidney disease: Secondary | ICD-10-CM | POA: Diagnosis not present

## 2018-02-10 DIAGNOSIS — R9082 White matter disease, unspecified: Secondary | ICD-10-CM | POA: Diagnosis not present

## 2018-02-10 DIAGNOSIS — M19011 Primary osteoarthritis, right shoulder: Secondary | ICD-10-CM | POA: Diagnosis not present

## 2018-02-10 DIAGNOSIS — I4891 Unspecified atrial fibrillation: Secondary | ICD-10-CM | POA: Diagnosis not present

## 2018-02-10 DIAGNOSIS — M419 Scoliosis, unspecified: Secondary | ICD-10-CM | POA: Diagnosis not present

## 2018-02-13 DIAGNOSIS — N183 Chronic kidney disease, stage 3 (moderate): Secondary | ICD-10-CM | POA: Diagnosis not present

## 2018-02-13 DIAGNOSIS — M419 Scoliosis, unspecified: Secondary | ICD-10-CM | POA: Diagnosis not present

## 2018-02-13 DIAGNOSIS — M19012 Primary osteoarthritis, left shoulder: Secondary | ICD-10-CM | POA: Diagnosis not present

## 2018-02-13 DIAGNOSIS — I4891 Unspecified atrial fibrillation: Secondary | ICD-10-CM | POA: Diagnosis not present

## 2018-02-13 DIAGNOSIS — G4733 Obstructive sleep apnea (adult) (pediatric): Secondary | ICD-10-CM | POA: Diagnosis not present

## 2018-02-13 DIAGNOSIS — E039 Hypothyroidism, unspecified: Secondary | ICD-10-CM | POA: Diagnosis not present

## 2018-02-13 DIAGNOSIS — R9082 White matter disease, unspecified: Secondary | ICD-10-CM | POA: Diagnosis not present

## 2018-02-13 DIAGNOSIS — M19011 Primary osteoarthritis, right shoulder: Secondary | ICD-10-CM | POA: Diagnosis not present

## 2018-02-13 DIAGNOSIS — M810 Age-related osteoporosis without current pathological fracture: Secondary | ICD-10-CM | POA: Diagnosis not present

## 2018-02-13 DIAGNOSIS — J45909 Unspecified asthma, uncomplicated: Secondary | ICD-10-CM | POA: Diagnosis not present

## 2018-02-13 DIAGNOSIS — D631 Anemia in chronic kidney disease: Secondary | ICD-10-CM | POA: Diagnosis not present

## 2018-02-13 DIAGNOSIS — E785 Hyperlipidemia, unspecified: Secondary | ICD-10-CM | POA: Diagnosis not present

## 2018-02-13 DIAGNOSIS — I129 Hypertensive chronic kidney disease with stage 1 through stage 4 chronic kidney disease, or unspecified chronic kidney disease: Secondary | ICD-10-CM | POA: Diagnosis not present

## 2018-02-13 DIAGNOSIS — M160 Bilateral primary osteoarthritis of hip: Secondary | ICD-10-CM | POA: Diagnosis not present

## 2018-02-13 DIAGNOSIS — G8929 Other chronic pain: Secondary | ICD-10-CM | POA: Diagnosis not present

## 2018-02-14 DIAGNOSIS — R05 Cough: Secondary | ICD-10-CM | POA: Diagnosis not present

## 2018-02-14 DIAGNOSIS — J441 Chronic obstructive pulmonary disease with (acute) exacerbation: Secondary | ICD-10-CM | POA: Diagnosis not present

## 2018-02-14 DIAGNOSIS — R0602 Shortness of breath: Secondary | ICD-10-CM | POA: Diagnosis not present

## 2018-02-14 DIAGNOSIS — R079 Chest pain, unspecified: Secondary | ICD-10-CM | POA: Diagnosis not present

## 2018-02-16 DIAGNOSIS — G4733 Obstructive sleep apnea (adult) (pediatric): Secondary | ICD-10-CM | POA: Diagnosis not present

## 2018-02-16 DIAGNOSIS — I4891 Unspecified atrial fibrillation: Secondary | ICD-10-CM | POA: Diagnosis not present

## 2018-02-16 DIAGNOSIS — Z79899 Other long term (current) drug therapy: Secondary | ICD-10-CM | POA: Diagnosis not present

## 2018-02-16 DIAGNOSIS — N183 Chronic kidney disease, stage 3 (moderate): Secondary | ICD-10-CM | POA: Diagnosis not present

## 2018-02-16 DIAGNOSIS — J449 Chronic obstructive pulmonary disease, unspecified: Secondary | ICD-10-CM | POA: Diagnosis not present

## 2018-02-16 DIAGNOSIS — J45909 Unspecified asthma, uncomplicated: Secondary | ICD-10-CM | POA: Diagnosis not present

## 2018-02-16 DIAGNOSIS — M19011 Primary osteoarthritis, right shoulder: Secondary | ICD-10-CM | POA: Diagnosis not present

## 2018-02-16 DIAGNOSIS — Z7902 Long term (current) use of antithrombotics/antiplatelets: Secondary | ICD-10-CM | POA: Diagnosis not present

## 2018-02-16 DIAGNOSIS — D631 Anemia in chronic kidney disease: Secondary | ICD-10-CM | POA: Diagnosis not present

## 2018-02-16 DIAGNOSIS — R9082 White matter disease, unspecified: Secondary | ICD-10-CM | POA: Diagnosis not present

## 2018-02-16 DIAGNOSIS — I129 Hypertensive chronic kidney disease with stage 1 through stage 4 chronic kidney disease, or unspecified chronic kidney disease: Secondary | ICD-10-CM | POA: Diagnosis not present

## 2018-02-16 DIAGNOSIS — E785 Hyperlipidemia, unspecified: Secondary | ICD-10-CM | POA: Diagnosis not present

## 2018-02-16 DIAGNOSIS — E039 Hypothyroidism, unspecified: Secondary | ICD-10-CM | POA: Diagnosis not present

## 2018-02-16 DIAGNOSIS — M19012 Primary osteoarthritis, left shoulder: Secondary | ICD-10-CM | POA: Diagnosis not present

## 2018-02-16 DIAGNOSIS — M160 Bilateral primary osteoarthritis of hip: Secondary | ICD-10-CM | POA: Diagnosis not present

## 2018-02-16 DIAGNOSIS — I10 Essential (primary) hypertension: Secondary | ICD-10-CM | POA: Diagnosis not present

## 2018-02-16 DIAGNOSIS — G8929 Other chronic pain: Secondary | ICD-10-CM | POA: Diagnosis not present

## 2018-02-16 DIAGNOSIS — M810 Age-related osteoporosis without current pathological fracture: Secondary | ICD-10-CM | POA: Diagnosis not present

## 2018-02-16 DIAGNOSIS — M419 Scoliosis, unspecified: Secondary | ICD-10-CM | POA: Diagnosis not present

## 2018-02-16 DIAGNOSIS — Z8673 Personal history of transient ischemic attack (TIA), and cerebral infarction without residual deficits: Secondary | ICD-10-CM | POA: Diagnosis not present

## 2018-02-16 DIAGNOSIS — S51011A Laceration without foreign body of right elbow, initial encounter: Secondary | ICD-10-CM | POA: Diagnosis not present

## 2018-02-16 DIAGNOSIS — S59919D Unspecified injury of unspecified forearm, subsequent encounter: Secondary | ICD-10-CM | POA: Diagnosis not present

## 2018-02-17 DIAGNOSIS — N183 Chronic kidney disease, stage 3 (moderate): Secondary | ICD-10-CM | POA: Diagnosis not present

## 2018-02-17 DIAGNOSIS — R197 Diarrhea, unspecified: Secondary | ICD-10-CM | POA: Diagnosis not present

## 2018-02-17 DIAGNOSIS — J189 Pneumonia, unspecified organism: Secondary | ICD-10-CM | POA: Diagnosis not present

## 2018-02-17 DIAGNOSIS — J449 Chronic obstructive pulmonary disease, unspecified: Secondary | ICD-10-CM | POA: Diagnosis not present

## 2018-02-18 DIAGNOSIS — M160 Bilateral primary osteoarthritis of hip: Secondary | ICD-10-CM | POA: Diagnosis not present

## 2018-02-18 DIAGNOSIS — G4733 Obstructive sleep apnea (adult) (pediatric): Secondary | ICD-10-CM | POA: Diagnosis not present

## 2018-02-18 DIAGNOSIS — M810 Age-related osteoporosis without current pathological fracture: Secondary | ICD-10-CM | POA: Diagnosis not present

## 2018-02-18 DIAGNOSIS — I129 Hypertensive chronic kidney disease with stage 1 through stage 4 chronic kidney disease, or unspecified chronic kidney disease: Secondary | ICD-10-CM | POA: Diagnosis not present

## 2018-02-18 DIAGNOSIS — N183 Chronic kidney disease, stage 3 (moderate): Secondary | ICD-10-CM | POA: Diagnosis not present

## 2018-02-18 DIAGNOSIS — M19011 Primary osteoarthritis, right shoulder: Secondary | ICD-10-CM | POA: Diagnosis not present

## 2018-02-18 DIAGNOSIS — R9082 White matter disease, unspecified: Secondary | ICD-10-CM | POA: Diagnosis not present

## 2018-02-18 DIAGNOSIS — G8929 Other chronic pain: Secondary | ICD-10-CM | POA: Diagnosis not present

## 2018-02-18 DIAGNOSIS — D631 Anemia in chronic kidney disease: Secondary | ICD-10-CM | POA: Diagnosis not present

## 2018-02-18 DIAGNOSIS — J45909 Unspecified asthma, uncomplicated: Secondary | ICD-10-CM | POA: Diagnosis not present

## 2018-02-18 DIAGNOSIS — R05 Cough: Secondary | ICD-10-CM | POA: Diagnosis not present

## 2018-02-18 DIAGNOSIS — M419 Scoliosis, unspecified: Secondary | ICD-10-CM | POA: Diagnosis not present

## 2018-02-18 DIAGNOSIS — E785 Hyperlipidemia, unspecified: Secondary | ICD-10-CM | POA: Diagnosis not present

## 2018-02-18 DIAGNOSIS — E039 Hypothyroidism, unspecified: Secondary | ICD-10-CM | POA: Diagnosis not present

## 2018-02-18 DIAGNOSIS — M19012 Primary osteoarthritis, left shoulder: Secondary | ICD-10-CM | POA: Diagnosis not present

## 2018-02-18 DIAGNOSIS — I4891 Unspecified atrial fibrillation: Secondary | ICD-10-CM | POA: Diagnosis not present

## 2018-02-19 DIAGNOSIS — D631 Anemia in chronic kidney disease: Secondary | ICD-10-CM | POA: Diagnosis not present

## 2018-02-19 DIAGNOSIS — G4733 Obstructive sleep apnea (adult) (pediatric): Secondary | ICD-10-CM | POA: Diagnosis not present

## 2018-02-19 DIAGNOSIS — M19012 Primary osteoarthritis, left shoulder: Secondary | ICD-10-CM | POA: Diagnosis not present

## 2018-02-19 DIAGNOSIS — M160 Bilateral primary osteoarthritis of hip: Secondary | ICD-10-CM | POA: Diagnosis not present

## 2018-02-19 DIAGNOSIS — I4891 Unspecified atrial fibrillation: Secondary | ICD-10-CM | POA: Diagnosis not present

## 2018-02-19 DIAGNOSIS — G8929 Other chronic pain: Secondary | ICD-10-CM | POA: Diagnosis not present

## 2018-02-19 DIAGNOSIS — M419 Scoliosis, unspecified: Secondary | ICD-10-CM | POA: Diagnosis not present

## 2018-02-19 DIAGNOSIS — N183 Chronic kidney disease, stage 3 (moderate): Secondary | ICD-10-CM | POA: Diagnosis not present

## 2018-02-19 DIAGNOSIS — E785 Hyperlipidemia, unspecified: Secondary | ICD-10-CM | POA: Diagnosis not present

## 2018-02-19 DIAGNOSIS — E039 Hypothyroidism, unspecified: Secondary | ICD-10-CM | POA: Diagnosis not present

## 2018-02-19 DIAGNOSIS — I129 Hypertensive chronic kidney disease with stage 1 through stage 4 chronic kidney disease, or unspecified chronic kidney disease: Secondary | ICD-10-CM | POA: Diagnosis not present

## 2018-02-19 DIAGNOSIS — M19011 Primary osteoarthritis, right shoulder: Secondary | ICD-10-CM | POA: Diagnosis not present

## 2018-02-19 DIAGNOSIS — R9082 White matter disease, unspecified: Secondary | ICD-10-CM | POA: Diagnosis not present

## 2018-02-19 DIAGNOSIS — M810 Age-related osteoporosis without current pathological fracture: Secondary | ICD-10-CM | POA: Diagnosis not present

## 2018-02-19 DIAGNOSIS — J45909 Unspecified asthma, uncomplicated: Secondary | ICD-10-CM | POA: Diagnosis not present

## 2018-02-23 DIAGNOSIS — J449 Chronic obstructive pulmonary disease, unspecified: Secondary | ICD-10-CM | POA: Diagnosis not present

## 2018-02-24 DIAGNOSIS — R9082 White matter disease, unspecified: Secondary | ICD-10-CM | POA: Diagnosis not present

## 2018-02-24 DIAGNOSIS — M19011 Primary osteoarthritis, right shoulder: Secondary | ICD-10-CM | POA: Diagnosis not present

## 2018-02-24 DIAGNOSIS — M810 Age-related osteoporosis without current pathological fracture: Secondary | ICD-10-CM | POA: Diagnosis not present

## 2018-02-24 DIAGNOSIS — M160 Bilateral primary osteoarthritis of hip: Secondary | ICD-10-CM | POA: Diagnosis not present

## 2018-02-24 DIAGNOSIS — R197 Diarrhea, unspecified: Secondary | ICD-10-CM | POA: Diagnosis not present

## 2018-02-24 DIAGNOSIS — R269 Unspecified abnormalities of gait and mobility: Secondary | ICD-10-CM | POA: Diagnosis not present

## 2018-02-24 DIAGNOSIS — M419 Scoliosis, unspecified: Secondary | ICD-10-CM | POA: Diagnosis not present

## 2018-02-24 DIAGNOSIS — E039 Hypothyroidism, unspecified: Secondary | ICD-10-CM | POA: Diagnosis not present

## 2018-02-24 DIAGNOSIS — D631 Anemia in chronic kidney disease: Secondary | ICD-10-CM | POA: Diagnosis not present

## 2018-02-24 DIAGNOSIS — J45909 Unspecified asthma, uncomplicated: Secondary | ICD-10-CM | POA: Diagnosis not present

## 2018-02-24 DIAGNOSIS — G8929 Other chronic pain: Secondary | ICD-10-CM | POA: Diagnosis not present

## 2018-02-24 DIAGNOSIS — E785 Hyperlipidemia, unspecified: Secondary | ICD-10-CM | POA: Diagnosis not present

## 2018-02-24 DIAGNOSIS — J189 Pneumonia, unspecified organism: Secondary | ICD-10-CM | POA: Diagnosis not present

## 2018-02-24 DIAGNOSIS — I129 Hypertensive chronic kidney disease with stage 1 through stage 4 chronic kidney disease, or unspecified chronic kidney disease: Secondary | ICD-10-CM | POA: Diagnosis not present

## 2018-02-24 DIAGNOSIS — N183 Chronic kidney disease, stage 3 (moderate): Secondary | ICD-10-CM | POA: Diagnosis not present

## 2018-02-24 DIAGNOSIS — G4733 Obstructive sleep apnea (adult) (pediatric): Secondary | ICD-10-CM | POA: Diagnosis not present

## 2018-02-24 DIAGNOSIS — I4891 Unspecified atrial fibrillation: Secondary | ICD-10-CM | POA: Diagnosis not present

## 2018-02-24 DIAGNOSIS — M19012 Primary osteoarthritis, left shoulder: Secondary | ICD-10-CM | POA: Diagnosis not present

## 2018-02-25 DIAGNOSIS — D631 Anemia in chronic kidney disease: Secondary | ICD-10-CM | POA: Diagnosis not present

## 2018-02-25 DIAGNOSIS — R9082 White matter disease, unspecified: Secondary | ICD-10-CM | POA: Diagnosis not present

## 2018-02-25 DIAGNOSIS — M160 Bilateral primary osteoarthritis of hip: Secondary | ICD-10-CM | POA: Diagnosis not present

## 2018-02-25 DIAGNOSIS — N183 Chronic kidney disease, stage 3 (moderate): Secondary | ICD-10-CM | POA: Diagnosis not present

## 2018-02-25 DIAGNOSIS — M810 Age-related osteoporosis without current pathological fracture: Secondary | ICD-10-CM | POA: Diagnosis not present

## 2018-02-25 DIAGNOSIS — G8929 Other chronic pain: Secondary | ICD-10-CM | POA: Diagnosis not present

## 2018-02-25 DIAGNOSIS — J45909 Unspecified asthma, uncomplicated: Secondary | ICD-10-CM | POA: Diagnosis not present

## 2018-02-25 DIAGNOSIS — M419 Scoliosis, unspecified: Secondary | ICD-10-CM | POA: Diagnosis not present

## 2018-02-25 DIAGNOSIS — M19012 Primary osteoarthritis, left shoulder: Secondary | ICD-10-CM | POA: Diagnosis not present

## 2018-02-25 DIAGNOSIS — E039 Hypothyroidism, unspecified: Secondary | ICD-10-CM | POA: Diagnosis not present

## 2018-02-25 DIAGNOSIS — I129 Hypertensive chronic kidney disease with stage 1 through stage 4 chronic kidney disease, or unspecified chronic kidney disease: Secondary | ICD-10-CM | POA: Diagnosis not present

## 2018-02-25 DIAGNOSIS — M19011 Primary osteoarthritis, right shoulder: Secondary | ICD-10-CM | POA: Diagnosis not present

## 2018-02-25 DIAGNOSIS — E785 Hyperlipidemia, unspecified: Secondary | ICD-10-CM | POA: Diagnosis not present

## 2018-02-25 DIAGNOSIS — I4891 Unspecified atrial fibrillation: Secondary | ICD-10-CM | POA: Diagnosis not present

## 2018-02-25 DIAGNOSIS — G4733 Obstructive sleep apnea (adult) (pediatric): Secondary | ICD-10-CM | POA: Diagnosis not present

## 2018-02-27 DIAGNOSIS — M19011 Primary osteoarthritis, right shoulder: Secondary | ICD-10-CM | POA: Diagnosis not present

## 2018-02-27 DIAGNOSIS — R9082 White matter disease, unspecified: Secondary | ICD-10-CM | POA: Diagnosis not present

## 2018-02-27 DIAGNOSIS — E039 Hypothyroidism, unspecified: Secondary | ICD-10-CM | POA: Diagnosis not present

## 2018-02-27 DIAGNOSIS — M419 Scoliosis, unspecified: Secondary | ICD-10-CM | POA: Diagnosis not present

## 2018-02-27 DIAGNOSIS — I4891 Unspecified atrial fibrillation: Secondary | ICD-10-CM | POA: Diagnosis not present

## 2018-02-27 DIAGNOSIS — G8929 Other chronic pain: Secondary | ICD-10-CM | POA: Diagnosis not present

## 2018-02-27 DIAGNOSIS — G4733 Obstructive sleep apnea (adult) (pediatric): Secondary | ICD-10-CM | POA: Diagnosis not present

## 2018-02-27 DIAGNOSIS — D631 Anemia in chronic kidney disease: Secondary | ICD-10-CM | POA: Diagnosis not present

## 2018-02-27 DIAGNOSIS — M810 Age-related osteoporosis without current pathological fracture: Secondary | ICD-10-CM | POA: Diagnosis not present

## 2018-02-27 DIAGNOSIS — N183 Chronic kidney disease, stage 3 (moderate): Secondary | ICD-10-CM | POA: Diagnosis not present

## 2018-02-27 DIAGNOSIS — M19012 Primary osteoarthritis, left shoulder: Secondary | ICD-10-CM | POA: Diagnosis not present

## 2018-02-27 DIAGNOSIS — M160 Bilateral primary osteoarthritis of hip: Secondary | ICD-10-CM | POA: Diagnosis not present

## 2018-02-27 DIAGNOSIS — J45909 Unspecified asthma, uncomplicated: Secondary | ICD-10-CM | POA: Diagnosis not present

## 2018-02-27 DIAGNOSIS — I129 Hypertensive chronic kidney disease with stage 1 through stage 4 chronic kidney disease, or unspecified chronic kidney disease: Secondary | ICD-10-CM | POA: Diagnosis not present

## 2018-02-27 DIAGNOSIS — E785 Hyperlipidemia, unspecified: Secondary | ICD-10-CM | POA: Diagnosis not present

## 2018-03-02 DIAGNOSIS — E039 Hypothyroidism, unspecified: Secondary | ICD-10-CM | POA: Diagnosis not present

## 2018-03-02 DIAGNOSIS — M810 Age-related osteoporosis without current pathological fracture: Secondary | ICD-10-CM | POA: Diagnosis not present

## 2018-03-02 DIAGNOSIS — I4891 Unspecified atrial fibrillation: Secondary | ICD-10-CM | POA: Diagnosis not present

## 2018-03-02 DIAGNOSIS — M160 Bilateral primary osteoarthritis of hip: Secondary | ICD-10-CM | POA: Diagnosis not present

## 2018-03-02 DIAGNOSIS — M419 Scoliosis, unspecified: Secondary | ICD-10-CM | POA: Diagnosis not present

## 2018-03-02 DIAGNOSIS — G8929 Other chronic pain: Secondary | ICD-10-CM | POA: Diagnosis not present

## 2018-03-02 DIAGNOSIS — G4733 Obstructive sleep apnea (adult) (pediatric): Secondary | ICD-10-CM | POA: Diagnosis not present

## 2018-03-02 DIAGNOSIS — R9082 White matter disease, unspecified: Secondary | ICD-10-CM | POA: Diagnosis not present

## 2018-03-02 DIAGNOSIS — J45909 Unspecified asthma, uncomplicated: Secondary | ICD-10-CM | POA: Diagnosis not present

## 2018-03-02 DIAGNOSIS — M19011 Primary osteoarthritis, right shoulder: Secondary | ICD-10-CM | POA: Diagnosis not present

## 2018-03-02 DIAGNOSIS — E785 Hyperlipidemia, unspecified: Secondary | ICD-10-CM | POA: Diagnosis not present

## 2018-03-02 DIAGNOSIS — D631 Anemia in chronic kidney disease: Secondary | ICD-10-CM | POA: Diagnosis not present

## 2018-03-02 DIAGNOSIS — N183 Chronic kidney disease, stage 3 (moderate): Secondary | ICD-10-CM | POA: Diagnosis not present

## 2018-03-02 DIAGNOSIS — I129 Hypertensive chronic kidney disease with stage 1 through stage 4 chronic kidney disease, or unspecified chronic kidney disease: Secondary | ICD-10-CM | POA: Diagnosis not present

## 2018-03-02 DIAGNOSIS — M19012 Primary osteoarthritis, left shoulder: Secondary | ICD-10-CM | POA: Diagnosis not present

## 2018-03-09 DIAGNOSIS — D631 Anemia in chronic kidney disease: Secondary | ICD-10-CM | POA: Diagnosis not present

## 2018-03-09 DIAGNOSIS — E785 Hyperlipidemia, unspecified: Secondary | ICD-10-CM | POA: Diagnosis not present

## 2018-03-09 DIAGNOSIS — I129 Hypertensive chronic kidney disease with stage 1 through stage 4 chronic kidney disease, or unspecified chronic kidney disease: Secondary | ICD-10-CM | POA: Diagnosis not present

## 2018-03-09 DIAGNOSIS — I4891 Unspecified atrial fibrillation: Secondary | ICD-10-CM | POA: Diagnosis not present

## 2018-03-09 DIAGNOSIS — M19011 Primary osteoarthritis, right shoulder: Secondary | ICD-10-CM | POA: Diagnosis not present

## 2018-03-09 DIAGNOSIS — R9082 White matter disease, unspecified: Secondary | ICD-10-CM | POA: Diagnosis not present

## 2018-03-09 DIAGNOSIS — M419 Scoliosis, unspecified: Secondary | ICD-10-CM | POA: Diagnosis not present

## 2018-03-09 DIAGNOSIS — M810 Age-related osteoporosis without current pathological fracture: Secondary | ICD-10-CM | POA: Diagnosis not present

## 2018-03-09 DIAGNOSIS — J45909 Unspecified asthma, uncomplicated: Secondary | ICD-10-CM | POA: Diagnosis not present

## 2018-03-09 DIAGNOSIS — M19012 Primary osteoarthritis, left shoulder: Secondary | ICD-10-CM | POA: Diagnosis not present

## 2018-03-09 DIAGNOSIS — E039 Hypothyroidism, unspecified: Secondary | ICD-10-CM | POA: Diagnosis not present

## 2018-03-09 DIAGNOSIS — M160 Bilateral primary osteoarthritis of hip: Secondary | ICD-10-CM | POA: Diagnosis not present

## 2018-03-09 DIAGNOSIS — N183 Chronic kidney disease, stage 3 (moderate): Secondary | ICD-10-CM | POA: Diagnosis not present

## 2018-03-09 DIAGNOSIS — G8929 Other chronic pain: Secondary | ICD-10-CM | POA: Diagnosis not present

## 2018-03-09 DIAGNOSIS — G4733 Obstructive sleep apnea (adult) (pediatric): Secondary | ICD-10-CM | POA: Diagnosis not present

## 2018-03-10 ENCOUNTER — Encounter: Payer: Self-pay | Admitting: Neurology

## 2018-03-10 ENCOUNTER — Ambulatory Visit (INDEPENDENT_AMBULATORY_CARE_PROVIDER_SITE_OTHER): Payer: Medicare Other | Admitting: Neurology

## 2018-03-10 VITALS — BP 140/57 | HR 75 | Ht 63.0 in | Wt 115.0 lb

## 2018-03-10 DIAGNOSIS — R413 Other amnesia: Secondary | ICD-10-CM | POA: Diagnosis not present

## 2018-03-10 NOTE — Progress Notes (Signed)
Reason for visit: Memory disorder, gait disorder  [name] is an 77 y.o. female  History of present illness:  [name] is a 77 year old right-handed white female with a history of extensive white matter disease by MRI and CT of the brain.  The patient has a subsequent memory disorder and a gait disorder.  She has a very definite tendency to lean backwards, she uses a walker for ambulation, but she continues to fall on a regular basis.  The patient went to the emergency room on 09 January 2018 after she fell backwards and struck her head requiring stitches.  The patient has not had a residual from this fall.  She is falling at least once a week.  She currently is in physical therapy.  She oftentimes will fall when she is not holding onto something.  She returns to this office for an evaluation.  She is on Namenda, she could not tolerate Aricept previously.  She believes that there has been very little change in her memory since last seen.  She lives in an extended care facility.  She lives in [place] of [address]  Past Medical History:  Diagnosis Date  . Abnormal heart rhythm   . Abnormality of gait 12/21/2015  . Afib (HCC)    h/o  . Anxiety   . Arthritis    "hips, shoulders" (01/05/2015)  . Back pain   . Chronic kidney disease (CKD), stage III (moderate) (HCC)    [name] 01/05/2015  . Dementia   . Depression   . Echocardiogram abnormal 02/26/11   MVP,mild MR,AOV mildly sclerotic. EF >55%  . GERD (gastroesophageal reflux disease)   . Hypertension   . Hypothyroidism   . Memory difficulty 06/05/2016  . Nausea & vomiting 11/2016  . Osteoporosis   . Osteoporosis   . Reactive airway disease   . S/P right hip fracture 06/2017   No surgery required  . Scoliosis   . Sleep apnea    "suppose to wear a mask; can't tolerate it" (01/05/2015)    Past Surgical History:  Procedure Laterality Date  . CATARACT EXTRACTION, BILATERAL Bilateral   . CESAREAN SECTION  1967  .  CESAREAN SECTION  1971  . DILATION AND CURETTAGE OF UTERUS    . FLEXIBLE SIGMOIDOSCOPY N/A 11/24/2016   Procedure: FLEXIBLE SIGMOIDOSCOPY;  Surgeon: Iva Boop, MD;  Location: [facility]COPY;  Service: Endoscopy;  Laterality: N/A;  . JOINT REPLACEMENT    . OPEN REDUCTION INTERNAL FIXATION (ORIF) DISTAL RADIAL FRACTURE Left 12/2009   [name] 12/28/2009  . TOTAL KNEE ARTHROPLASTY Left 03/2010   [name] 04/07/2010  . TUBAL LIGATION    . VAGINAL HYSTERECTOMY  1980    Family History  Problem Relation Age of Onset  . Cancer Mother   . Heart disease Father   . Cancer Father     Social history:  reports that she has never smoked. She has never used smokeless tobacco. She reports that she does not drink alcohol or use drugs.    Allergies  Allergen Reactions  . Vicodin [Hydrocodone-Acetaminophen] Nausea And Vomiting    Medications:  Prior to Admission medications   Medication Sig Start Date End Date Taking? Authorizing Provider  acetaminophen (TYLENOL) 325 MG tablet Take 650 mg by mouth 4 (four) times daily.    Yes [provider]  anti-nausea (EMETROL) solution Take 10 mLs by mouth every 15 (fifteen) minutes as needed for nausea or vomiting.   Yes [provider]  baclofen (  LIORESAL) 10 MG tablet Take 10 mg by mouth at bedtime as needed for muscle spasms.    Yes [provider]  budesonide-formoterol (SYMBICORT) 160-4.5 MCG/ACT inhaler Inhale 2 puffs into the lungs 2 (two) times daily. Rinse mouth after use - discard 3 months after opening   Yes [provider]  calcitRIOL (ROCALTROL) 0.25 MCG capsule Take 0.25 mcg by mouth every Monday, Wednesday, and Friday. Do not crush   Yes [provider]  calcium-vitamin D (OSCAL WITH D) 250-125 MG-UNIT tablet Take 1 tablet by mouth 2 (two) times daily.   Yes [provider]  clonazePAM (KLONOPIN) 2 MG tablet Take 1 tablet (2 mg total) by mouth at bedtime. 10/24/17  Yes Young, Joni Fearslinton D, MD    clopidogrel (PLAVIX) 75 MG tablet Take 1 tablet (75 mg total) by mouth daily. 06/05/16  Yes York SpanielWillis, Charles K, MD  diltiazem (CARDIZEM CD) 120 MG 24 hr capsule Take 120 mg by mouth daily. 05/21/17  Yes [provider]  diltiazem (CARDIZEM) 30 MG tablet Take 30 mg by mouth at bedtime as needed (pulse greater than 110).   Yes [provider]  diphenhydrAMINE (BENADRYL) 25 MG tablet Take 25 mg by mouth every 4 (four) hours as needed for itching (minor allergic reaction).   Yes [provider]  guaifenesin (ROBITUSSIN) 100 MG/5ML syrup Take 200 mg by mouth every 4 (four) hours as needed for cough.   Yes [provider]  levothyroxine (SYNTHROID, LEVOTHROID) 112 MCG tablet Take 112 mcg by mouth daily.    Yes [provider]  lubiprostone (AMITIZA) 24 MCG capsule Take 24 mcg by mouth 2 (two) times daily with a meal.   Yes [provider]  memantine (NAMENDA) 10 MG tablet Take 10 mg by mouth 2 (two) times daily.   Yes [provider]  metoprolol tartrate (LOPRESSOR) 25 MG tablet Take 12.5 mg by mouth 2 (two) times daily.    Yes [provider]  Omega-3 Fatty Acids (FISH OIL) 1000 MG CAPS Take 1,000 mg by mouth daily. Do not crush   Yes [provider]  ondansetron (ZOFRAN) 4 MG tablet Take 4 mg by mouth every 8 (eight) hours as needed for nausea or vomiting.   Yes [provider]  oxyCODONE (OXY IR/ROXICODONE) 5 MG immediate release tablet Take 1 tablet (5 mg total) by mouth every 4 (four) hours as needed for breakthrough pain. Patient taking differently: Take 7.5 mg by mouth every 4 (four) hours as needed for breakthrough pain.  05/28/17  Yes Meredeth IdeLama, Gagan S, MD  polyethylene glycol (MIRALAX / GLYCOLAX) packet Take 17 g by mouth daily. Mix in 8 oz of water and drink   Yes [provider]  senna-docusate (SENOKOT-S) 8.6-50 MG tablet Take 1 tablet by mouth 2 (two) times daily. 05/28/17  Yes Meredeth IdeLama, Gagan S, MD   sertraline (ZOLOFT) 100 MG tablet Take 150 mg by mouth daily.   Yes [provider]  traZODone (DESYREL) 100 MG tablet Take 1 tablet (100 mg total) by mouth at bedtime. 09/30/16  Yes Young, Joni Fearslinton D, MD    ROS:  Out of a complete 14 system review of symptoms, the patient complains only of the following symptoms, and all other reviewed systems are negative.  Decreased appetite, weight loss Blurred vision Chest tightness Palpitations of the heart, heart murmur Cold intolerance Constipation, diarrhea, incontinence of the bowels Insomnia, sleep apnea, frequent waking, sleep walking Incontinence of the bladder, urinary urgency Back pain, aching muscles,  muscle cramps, walking difficulty, neck pain, neck stiffness Bruising easily Memory loss, dizziness, headache  Blood pressure (!) 140/57, pulse 75, height 5\' 3"  (1.6 m), weight 115 lb (52.2 kg).  Physical Exam  General: The patient is alert and cooperative at the time of the examination.  Skin: No significant peripheral edema is noted.   Neurologic Exam  Mental status: The patient is alert and oriented x 3 at the time of the examination. The patient has apparent normal recent and remote memory, with an apparently normal attention span and concentration ability.  Mini-Mental status examination done today shows a total score 28/30   Cranial nerves: Facial symmetry is present. Speech is normal, no aphasia or dysarthria is noted. Extraocular movements are full. Visual fields are full.  Motor: The patient has good strength in all 4 extremities.  Sensory examination: Soft touch sensation is symmetric on the face, arms, and legs.  Coordination: The patient has good finger-nose-finger and heel-to-shin bilaterally.  Gait and station: The patient has a slightly wide-based gait, the patient uses a walker for ambulation and has good stability with a walker.  Even with sitting, the patient has a tendency to lean backwards.  Romberg  is negative but is unsteady.  Reflexes: Deep tendon reflexes are symmetric.   CT head 01/09/18:  IMPRESSION: No acute intracranial abnormality.  Atrophy, chronic microvascular disease.  * CT scan images were reviewed online. I agree with the written report.    Assessment/Plan:  1.  Memory disorder  2.  Gait disorder  3.  Extensive white matter disease by MRI and CT  The patient continues to have a significant risk for falling.  I have indicated that she should not let go when she is using one arm to reach for something, she is always to hold on to something.  The patient is in physical therapy for gait training currently.  She will continue the Namenda for memory.  She will follow-up in 1 year.  Marlan Palau MD 03/10/2018 2:32 PM  [facility] [address] [place], [place] [address]  Phone [phone] Fax [phone]

## 2018-03-11 DIAGNOSIS — J45909 Unspecified asthma, uncomplicated: Secondary | ICD-10-CM | POA: Diagnosis not present

## 2018-03-11 DIAGNOSIS — E785 Hyperlipidemia, unspecified: Secondary | ICD-10-CM | POA: Diagnosis not present

## 2018-03-11 DIAGNOSIS — E039 Hypothyroidism, unspecified: Secondary | ICD-10-CM | POA: Diagnosis not present

## 2018-03-11 DIAGNOSIS — G8929 Other chronic pain: Secondary | ICD-10-CM | POA: Diagnosis not present

## 2018-03-11 DIAGNOSIS — M19011 Primary osteoarthritis, right shoulder: Secondary | ICD-10-CM | POA: Diagnosis not present

## 2018-03-11 DIAGNOSIS — R9082 White matter disease, unspecified: Secondary | ICD-10-CM | POA: Diagnosis not present

## 2018-03-11 DIAGNOSIS — M160 Bilateral primary osteoarthritis of hip: Secondary | ICD-10-CM | POA: Diagnosis not present

## 2018-03-11 DIAGNOSIS — G4733 Obstructive sleep apnea (adult) (pediatric): Secondary | ICD-10-CM | POA: Diagnosis not present

## 2018-03-11 DIAGNOSIS — D631 Anemia in chronic kidney disease: Secondary | ICD-10-CM | POA: Diagnosis not present

## 2018-03-11 DIAGNOSIS — N183 Chronic kidney disease, stage 3 (moderate): Secondary | ICD-10-CM | POA: Diagnosis not present

## 2018-03-11 DIAGNOSIS — M810 Age-related osteoporosis without current pathological fracture: Secondary | ICD-10-CM | POA: Diagnosis not present

## 2018-03-11 DIAGNOSIS — M419 Scoliosis, unspecified: Secondary | ICD-10-CM | POA: Diagnosis not present

## 2018-03-11 DIAGNOSIS — I4891 Unspecified atrial fibrillation: Secondary | ICD-10-CM | POA: Diagnosis not present

## 2018-03-11 DIAGNOSIS — M19012 Primary osteoarthritis, left shoulder: Secondary | ICD-10-CM | POA: Diagnosis not present

## 2018-03-11 DIAGNOSIS — I129 Hypertensive chronic kidney disease with stage 1 through stage 4 chronic kidney disease, or unspecified chronic kidney disease: Secondary | ICD-10-CM | POA: Diagnosis not present

## 2018-03-12 DIAGNOSIS — R9082 White matter disease, unspecified: Secondary | ICD-10-CM | POA: Diagnosis not present

## 2018-03-12 DIAGNOSIS — G4733 Obstructive sleep apnea (adult) (pediatric): Secondary | ICD-10-CM | POA: Diagnosis not present

## 2018-03-12 DIAGNOSIS — M810 Age-related osteoporosis without current pathological fracture: Secondary | ICD-10-CM | POA: Diagnosis not present

## 2018-03-12 DIAGNOSIS — G8929 Other chronic pain: Secondary | ICD-10-CM | POA: Diagnosis not present

## 2018-03-12 DIAGNOSIS — M419 Scoliosis, unspecified: Secondary | ICD-10-CM | POA: Diagnosis not present

## 2018-03-12 DIAGNOSIS — M160 Bilateral primary osteoarthritis of hip: Secondary | ICD-10-CM | POA: Diagnosis not present

## 2018-03-12 DIAGNOSIS — D631 Anemia in chronic kidney disease: Secondary | ICD-10-CM | POA: Diagnosis not present

## 2018-03-12 DIAGNOSIS — J45909 Unspecified asthma, uncomplicated: Secondary | ICD-10-CM | POA: Diagnosis not present

## 2018-03-12 DIAGNOSIS — M19012 Primary osteoarthritis, left shoulder: Secondary | ICD-10-CM | POA: Diagnosis not present

## 2018-03-12 DIAGNOSIS — I129 Hypertensive chronic kidney disease with stage 1 through stage 4 chronic kidney disease, or unspecified chronic kidney disease: Secondary | ICD-10-CM | POA: Diagnosis not present

## 2018-03-12 DIAGNOSIS — E039 Hypothyroidism, unspecified: Secondary | ICD-10-CM | POA: Diagnosis not present

## 2018-03-12 DIAGNOSIS — N183 Chronic kidney disease, stage 3 (moderate): Secondary | ICD-10-CM | POA: Diagnosis not present

## 2018-03-12 DIAGNOSIS — E785 Hyperlipidemia, unspecified: Secondary | ICD-10-CM | POA: Diagnosis not present

## 2018-03-12 DIAGNOSIS — M19011 Primary osteoarthritis, right shoulder: Secondary | ICD-10-CM | POA: Diagnosis not present

## 2018-03-12 DIAGNOSIS — I4891 Unspecified atrial fibrillation: Secondary | ICD-10-CM | POA: Diagnosis not present

## 2018-03-13 DIAGNOSIS — E119 Type 2 diabetes mellitus without complications: Secondary | ICD-10-CM | POA: Diagnosis not present

## 2018-03-13 DIAGNOSIS — Z79899 Other long term (current) drug therapy: Secondary | ICD-10-CM | POA: Diagnosis not present

## 2018-03-13 DIAGNOSIS — E559 Vitamin D deficiency, unspecified: Secondary | ICD-10-CM | POA: Diagnosis not present

## 2018-03-13 DIAGNOSIS — D518 Other vitamin B12 deficiency anemias: Secondary | ICD-10-CM | POA: Diagnosis not present

## 2018-03-13 DIAGNOSIS — E7849 Other hyperlipidemia: Secondary | ICD-10-CM | POA: Diagnosis not present

## 2018-03-13 DIAGNOSIS — E038 Other specified hypothyroidism: Secondary | ICD-10-CM | POA: Diagnosis not present

## 2018-03-16 DIAGNOSIS — G4733 Obstructive sleep apnea (adult) (pediatric): Secondary | ICD-10-CM | POA: Diagnosis not present

## 2018-03-17 ENCOUNTER — Telehealth: Payer: Self-pay | Admitting: Internal Medicine

## 2018-03-17 DIAGNOSIS — M160 Bilateral primary osteoarthritis of hip: Secondary | ICD-10-CM | POA: Diagnosis not present

## 2018-03-17 DIAGNOSIS — E039 Hypothyroidism, unspecified: Secondary | ICD-10-CM | POA: Diagnosis not present

## 2018-03-17 DIAGNOSIS — R197 Diarrhea, unspecified: Secondary | ICD-10-CM | POA: Diagnosis not present

## 2018-03-17 DIAGNOSIS — D631 Anemia in chronic kidney disease: Secondary | ICD-10-CM | POA: Diagnosis not present

## 2018-03-17 DIAGNOSIS — M810 Age-related osteoporosis without current pathological fracture: Secondary | ICD-10-CM | POA: Diagnosis not present

## 2018-03-17 DIAGNOSIS — R269 Unspecified abnormalities of gait and mobility: Secondary | ICD-10-CM | POA: Diagnosis not present

## 2018-03-17 DIAGNOSIS — M19011 Primary osteoarthritis, right shoulder: Secondary | ICD-10-CM | POA: Diagnosis not present

## 2018-03-17 DIAGNOSIS — R9082 White matter disease, unspecified: Secondary | ICD-10-CM | POA: Diagnosis not present

## 2018-03-17 DIAGNOSIS — J449 Chronic obstructive pulmonary disease, unspecified: Secondary | ICD-10-CM | POA: Diagnosis not present

## 2018-03-17 DIAGNOSIS — M419 Scoliosis, unspecified: Secondary | ICD-10-CM | POA: Diagnosis not present

## 2018-03-17 DIAGNOSIS — M19012 Primary osteoarthritis, left shoulder: Secondary | ICD-10-CM | POA: Diagnosis not present

## 2018-03-17 DIAGNOSIS — N183 Chronic kidney disease, stage 3 (moderate): Secondary | ICD-10-CM | POA: Diagnosis not present

## 2018-03-17 DIAGNOSIS — I129 Hypertensive chronic kidney disease with stage 1 through stage 4 chronic kidney disease, or unspecified chronic kidney disease: Secondary | ICD-10-CM | POA: Diagnosis not present

## 2018-03-17 NOTE — Telephone Encounter (Signed)
Spoke with [name], she states Dr. Maple Hudson signed a medication approval for budesonide and Brovana for this pt. I do not see this in the med list. CY Do you recall? If so please advise dose of Pulmicort. Then we can fax updated med list and fax to [phone]  Current Outpatient Medications on File Prior to Visit  Medication Sig Dispense Refill  . acetaminophen (TYLENOL) 325 MG tablet Take 650 mg by mouth 4 (four) times daily.     [name] anti-nausea (EMETROL) solution Take 10 mLs by mouth every 15 (fifteen) minutes as needed for nausea or vomiting.    . baclofen (LIORESAL) 10 MG tablet Take 10 mg by mouth at bedtime as needed for muscle spasms.     . budesonide-formoterol (SYMBICORT) 160-4.5 MCG/ACT inhaler Inhale 2 puffs into the lungs 2 (two) times daily. Rinse mouth after use - discard 3 months after opening    . calcitRIOL (ROCALTROL) 0.25 MCG capsule Take 0.25 mcg by mouth every Monday, Wednesday, and Friday. Do not crush    . calcium-vitamin D (OSCAL WITH D) 250-125 MG-UNIT tablet Take 1 tablet by mouth 2 (two) times daily.    . clonazePAM (KLONOPIN) 2 MG tablet Take 1 tablet (2 mg total) by mouth at bedtime. 30 tablet 5  . clopidogrel (PLAVIX) 75 MG tablet Take 1 tablet (75 mg total) by mouth daily. 30 tablet 5  . diltiazem (CARDIZEM CD) 120 MG 24 hr capsule Take 120 mg by mouth daily.  3  . diltiazem (CARDIZEM) 30 MG tablet Take 30 mg by mouth at bedtime as needed (pulse greater than 110).    . diphenhydrAMINE (BENADRYL) 25 MG tablet Take 25 mg by mouth every 4 (four) hours as needed for itching (minor allergic reaction).    [name] guaifenesin (ROBITUSSIN) 100 MG/5ML syrup Take 200 mg by mouth every 4 (four) hours as needed for cough.    . levothyroxine (SYNTHROID, LEVOTHROID) 112 MCG tablet Take 112 mcg by mouth daily.     [name] lubiprostone (AMITIZA) 24 MCG capsule Take 24 mcg by mouth 2 (two) times daily with a meal.    . memantine (NAMENDA) 10 MG tablet Take 10 mg by mouth 2 (two) times daily.    .  metoprolol tartrate (LOPRESSOR) 25 MG tablet Take 12.5 mg by mouth 2 (two) times daily.     . Omega-3 Fatty Acids (FISH OIL) 1000 MG CAPS Take 1,000 mg by mouth daily. Do not crush    . ondansetron (ZOFRAN) 4 MG tablet Take 4 mg by mouth every 8 (eight) hours as needed for nausea or vomiting.    [name] oxyCODONE (OXY IR/ROXICODONE) 5 MG immediate release tablet Take 1 tablet (5 mg total) by mouth every 4 (four) hours as needed for breakthrough pain. (Patient taking differently: Take 7.5 mg by mouth every 4 (four) hours as needed for breakthrough pain. ) 30 tablet 0  . polyethylene glycol (MIRALAX / GLYCOLAX) packet Take 17 g by mouth daily. Mix in 8 oz of water and drink    . senna-docusate (SENOKOT-S) 8.6-50 MG tablet Take 1 tablet by mouth 2 (two) times daily. 10 tablet 0  . sertraline (ZOLOFT) 100 MG tablet Take 150 mg by mouth daily.    . traZODone (DESYREL) 100 MG tablet Take 1 tablet (100 mg total) by mouth at bedtime. 30 tablet 12   No current facility-administered medications on file prior to visit.    Allergies  Allergen Reactions  . Vicodin [Hydrocodone-Acetaminophen] Nausea And Vomiting

## 2018-03-17 NOTE — Telephone Encounter (Signed)
It would be the .25 budesonide neb solution.   I signed it and gave it to ?Orpha Bur, who may have just faxed without entering. The form should show up in media eventually if that's what she did.

## 2018-03-18 NOTE — Telephone Encounter (Signed)
Ok to put both on her med list. Thanks.

## 2018-03-18 NOTE — Telephone Encounter (Signed)
Sorry I have not had any information on this patient to my knowledge.

## 2018-03-18 NOTE — Telephone Encounter (Signed)
Med list has been printed and faxed to [facility].   Called [facility] pharmacy to make aware as they have called back again regarding this.  Nothing further needed at this time.

## 2018-03-18 NOTE — Telephone Encounter (Signed)
No form is needed on this patient- an updated medication list with these medications needs to be faxed to [facility] at [phone]  [facility] pharmacy to verify- states that pt was prescribed both budesonide and brovana, and budesonide strength was 0.5 and not the 0.25 solution.    CY please verify if pt is to be on these medications- per chart pt has never been prescribed either of these medications.

## 2018-03-18 NOTE — Telephone Encounter (Signed)
Katie, do you remember anything about these forms? Please advise. Thanks!

## 2018-03-18 NOTE — Telephone Encounter (Signed)
Shanda Bumps from [facility] calling to check status of med list to be faxed. Cb is [phone] Fax number [phone].

## 2018-04-27 ENCOUNTER — Other Ambulatory Visit: Payer: Self-pay | Admitting: Internal Medicine

## 2018-04-28 NOTE — Telephone Encounter (Signed)
CY Please advise on refill.  

## 2018-04-29 NOTE — Telephone Encounter (Signed)
Called [facility] and was informed by tech a Chalmers Caterebecca Roth[facility]  I cancelled our order b/c of this  Will forward to CDY as FYI

## 2018-04-29 NOTE — Telephone Encounter (Signed)
Ok to refill total 6 months 

## 2018-04-30 ENCOUNTER — Other Ambulatory Visit: Payer: Self-pay | Admitting: Internal Medicine

## 2018-04-30 ENCOUNTER — Telehealth: Payer: Self-pay | Admitting: Internal Medicine

## 2018-04-30 MED ORDER — CLONAZEPAM 2 MG PO TABS: 2.0000 mg | ORAL_TABLET | Freq: Every day | ORAL | 1 refills | Status: DC

## 2018-04-30 NOTE — Telephone Encounter (Signed)
Ok to refill total 6 months 

## 2018-04-30 NOTE — Telephone Encounter (Signed)
Spoke with pt's daughter, Elita Quickam. She is aware that this prescription will be called in. Rx has been called in. Nothing further was needed.

## 2018-04-30 NOTE — Telephone Encounter (Signed)
We got notice from pharmacy that this refill was already auth by her assisted living facillity.

## 2018-04-30 NOTE — Telephone Encounter (Signed)
CY Please advise on refill for patient. Thanks.

## 2018-04-30 NOTE — Telephone Encounter (Signed)
Patient is requesting a refill of Clonzepam. Patient was last seen on 2.28.19 by CY. Medication was last filled on 12.6.18 with 5 refills.   CY please advise,  Thank you.   Current Outpatient Medications on File Prior to Visit  Medication Sig Dispense Refill  . acetaminophen (TYLENOL) 325 MG tablet Take 650 mg by mouth 4 (four) times daily.     [name]. anti-nausea (EMETROL) solution Take 10 mLs by mouth every 15 (fifteen) minutes as needed for nausea or vomiting.    [name]. arformoterol (BROVANA) 15 MCG/2ML NEBU Take 2 mLs (15 mcg total) by nebulization 2 (two) times daily. 120 mL 12  . baclofen (LIORESAL) 10 MG tablet Take 10 mg by mouth at bedtime as needed for muscle spasms.     . budesonide (PULMICORT) 0.5 MG/2ML nebulizer solution Take 2 mLs (0.5 mg total) by nebulization 2 (two) times daily. 120 mL 12  . budesonide-formoterol (SYMBICORT) 160-4.5 MCG/ACT inhaler Inhale 2 puffs into the lungs 2 (two) times daily. Rinse mouth after use - discard 3 months after opening    . calcitRIOL (ROCALTROL) 0.25 MCG capsule Take 0.25 mcg by mouth every Monday, Wednesday, and Friday. Do not crush    . calcium-vitamin D (OSCAL WITH D) 250-125 MG-UNIT tablet Take 1 tablet by mouth 2 (two) times daily.    . clonazePAM (KLONOPIN) 2 MG tablet Take 1 tablet (2 mg total) by mouth at bedtime. 30 tablet 5  . clopidogrel (PLAVIX) 75 MG tablet Take 1 tablet (75 mg total) by mouth daily. 30 tablet 5  . diltiazem (CARDIZEM CD) 120 MG 24 hr capsule Take 120 mg by mouth daily.  3  . diltiazem (CARDIZEM) 30 MG tablet Take 30 mg by mouth at bedtime as needed (pulse greater than 110).    . diphenhydrAMINE (BENADRYL) 25 MG tablet Take 25 mg by mouth every 4 (four) hours as needed for itching (minor allergic reaction).    [name]. guaifenesin (ROBITUSSIN) 100 MG/5ML syrup Take 200 mg by mouth every 4 (four) hours as needed for cough.    . levothyroxine (SYNTHROID, LEVOTHROID) 112 MCG tablet Take 112 mcg by mouth daily.     [name]. lubiprostone  (AMITIZA) 24 MCG capsule Take 24 mcg by mouth 2 (two) times daily with a meal.    . memantine (NAMENDA) 10 MG tablet Take 10 mg by mouth 2 (two) times daily.    . metoprolol tartrate (LOPRESSOR) 25 MG tablet Take 12.5 mg by mouth 2 (two) times daily.     . Omega-3 Fatty Acids (FISH OIL) 1000 MG CAPS Take 1,000 mg by mouth daily. Do not crush    . ondansetron (ZOFRAN) 4 MG tablet Take 4 mg by mouth every 8 (eight) hours as needed for nausea or vomiting.    [name]. oxyCODONE (OXY IR/ROXICODONE) 5 MG immediate release tablet Take 1 tablet (5 mg total) by mouth every 4 (four) hours as needed for breakthrough pain. (Patient taking differently: Take 7.5 mg by mouth every 4 (four) hours as needed for breakthrough pain. ) 30 tablet 0  . polyethylene glycol (MIRALAX / GLYCOLAX) packet Take 17 g by mouth daily. Mix in 8 oz of water and drink    . senna-docusate (SENOKOT-S) 8.6-50 MG tablet Take 1 tablet by mouth 2 (two) times daily. 10 tablet 0  . sertraline (ZOLOFT) 100 MG tablet Take 150 mg by mouth daily.    . traZODone (DESYREL) 100 MG tablet Take 1 tablet (100 mg total) by mouth at bedtime. 30 tablet  12   No current facility-administered medications on file prior to visit.    Allergies  Allergen Reactions  . Vicodin [Hydrocodone-Acetaminophen] Nausea And Vomiting

## 2018-05-02 DIAGNOSIS — G4733 Obstructive sleep apnea (adult) (pediatric): Secondary | ICD-10-CM

## 2018-05-03 DIAGNOSIS — R0683 Snoring: Secondary | ICD-10-CM | POA: Diagnosis not present

## 2018-05-05 ENCOUNTER — Other Ambulatory Visit: Payer: Self-pay | Admitting: *Deleted

## 2018-05-05 DIAGNOSIS — G4733 Obstructive sleep apnea (adult) (pediatric): Secondary | ICD-10-CM

## 2018-06-03 ENCOUNTER — Telehealth: Payer: Self-pay | Admitting: Pharmacy Technician

## 2018-06-03 ENCOUNTER — Telehealth: Payer: Self-pay | Admitting: Internal Medicine

## 2018-06-03 DIAGNOSIS — G4734 Idiopathic sleep related nonobstructive alveolar hypoventilation: Secondary | ICD-10-CM

## 2018-06-03 NOTE — Telephone Encounter (Signed)
Spoke with pt. She is requesting her home sleep study results.  CY - please advise. Thanks. 

## 2018-06-04 NOTE — Telephone Encounter (Signed)
Error

## 2018-06-05 NOTE — Telephone Encounter (Signed)
Called Patient to let her know that we received her message and someone will contact her as soon as her sleep study results are posted. Patient stated understanding.

## 2018-06-09 NOTE — Telephone Encounter (Signed)
CY Please advise on HST results-they are in Epic but no instructions given for sleep treatment. Thanks.

## 2018-06-10 NOTE — Telephone Encounter (Signed)
Her home sleep test showed only occasional apneas, averaging 4/ hour, which is within normal. She doesn't need CPAP.  However- her oxygen scores were low, including 19.8 minutes with oxygen saturation of 88% or less. This meets the standards for wearing home oxygen during sleep.   Order DME(was using [employer] when she was on CPAP)  O2 for sleep, 2L, based on her home sleep test results.      Dx Nocturnal Hypoxemia

## 2018-06-10 NOTE — Telephone Encounter (Signed)
Spoke with [name] and advised of home sleep study results per Dr Maple HudsonYoung.  [name] verbalized understanding .  Order placed for Oxygen therapy at night.

## 2018-06-22 ENCOUNTER — Ambulatory Visit: Payer: Self-pay | Admitting: Nurse Practitioner

## 2018-06-23 ENCOUNTER — Ambulatory Visit: Payer: Self-pay | Admitting: Nurse Practitioner

## 2018-07-15 ENCOUNTER — Ambulatory Visit: Payer: Self-pay | Admitting: Internal Medicine

## 2018-10-09 ENCOUNTER — Ambulatory Visit: Payer: Self-pay | Admitting: Internal Medicine

## 2018-10-09 ENCOUNTER — Ambulatory Visit: Payer: Self-pay | Admitting: Nurse Practitioner

## 2019-02-13 ENCOUNTER — Emergency Department (HOSPITAL_COMMUNITY)
Admission: EM | Admit: 2019-02-13 | Discharge: 2019-02-13 | Disposition: A | Discharge status: Home / Self Care | Payer: Medicare Other | Attending: Emergency Medicine | Admitting: Emergency Medicine

## 2019-02-13 ENCOUNTER — Emergency Department (HOSPITAL_COMMUNITY): Payer: Medicare Other

## 2019-02-13 ENCOUNTER — Other Ambulatory Visit: Payer: Self-pay

## 2019-02-13 ENCOUNTER — Encounter (HOSPITAL_COMMUNITY): Payer: Self-pay | Admitting: Emergency Medicine

## 2019-02-13 DIAGNOSIS — W0110XA Fall on same level from slipping, tripping and stumbling with subsequent striking against unspecified object, initial encounter: Secondary | ICD-10-CM | POA: Diagnosis not present

## 2019-02-13 DIAGNOSIS — I129 Hypertensive chronic kidney disease with stage 1 through stage 4 chronic kidney disease, or unspecified chronic kidney disease: Secondary | ICD-10-CM | POA: Insufficient documentation

## 2019-02-13 DIAGNOSIS — Y999 Unspecified external cause status: Secondary | ICD-10-CM | POA: Insufficient documentation

## 2019-02-13 DIAGNOSIS — E039 Hypothyroidism, unspecified: Secondary | ICD-10-CM | POA: Diagnosis not present

## 2019-02-13 DIAGNOSIS — N183 Chronic kidney disease, stage 3 (moderate): Secondary | ICD-10-CM | POA: Insufficient documentation

## 2019-02-13 DIAGNOSIS — W19XXXA Unspecified fall, initial encounter: Secondary | ICD-10-CM

## 2019-02-13 DIAGNOSIS — T148XXA Other injury of unspecified body region, initial encounter: Secondary | ICD-10-CM

## 2019-02-13 DIAGNOSIS — S0990XA Unspecified injury of head, initial encounter: Secondary | ICD-10-CM

## 2019-02-13 DIAGNOSIS — Y92193 Bedroom in other specified residential institution as the place of occurrence of the external cause: Secondary | ICD-10-CM | POA: Diagnosis not present

## 2019-02-13 DIAGNOSIS — Z79899 Other long term (current) drug therapy: Secondary | ICD-10-CM | POA: Diagnosis not present

## 2019-02-13 DIAGNOSIS — Y9301 Activity, walking, marching and hiking: Secondary | ICD-10-CM | POA: Insufficient documentation

## 2019-02-13 DIAGNOSIS — Z7901 Long term (current) use of anticoagulants: Secondary | ICD-10-CM | POA: Insufficient documentation

## 2019-02-13 DIAGNOSIS — Z96652 Presence of left artificial knee joint: Secondary | ICD-10-CM | POA: Diagnosis not present

## 2019-02-13 MED ORDER — ACETAMINOPHEN 325 MG PO TABS: 650.0000 mg | ORAL_TABLET | Freq: Once | ORAL | Status: DC

## 2019-02-13 NOTE — ED Triage Notes (Signed)
Pt from assisted living to walk from chair with unsteady gait with age. Pt struck back of head on wheelchair. No LOC or other symptoms. Complains frontal headache, no neuro symptoms. 1/2 lac to left back of head. AO x person place event at baseline.

## 2019-02-13 NOTE — ED Notes (Signed)
Small LAC noted to back of head, bleeding controlled

## 2019-02-13 NOTE — ED Provider Notes (Signed)
[facility] EMERGENCY DEPARTMENT Provider Note   CSN: [id number] Arrival date & time: 02/13/19  1230    History   Chief Complaint Chief Complaint  Patient presents with  . Fall    HPI [name] is a 78 y.o. female.     HPI   She was walking, next to her bed, holding on when she suddenly fell striking the back of her head.  She relates her fall as caused by her chronic difficulty walking after her "mini strokes."  Typically she wheelchair, except when transferring.  She denies headache, neck pain, extremity pain.  She is worried about a rash on her left arm.  The rash is been present for many months.  She denies fever, chills, cough, shortness of breath, rhinorrhea, nausea or vomiting.  There are no other known modifying factors.  Past Medical History:  Diagnosis Date  . Abnormal heart rhythm   . Abnormality of gait 12/21/2015  . Afib (HCC)    h/o  . Anxiety   . Arthritis    "hips, shoulders" (01/05/2015)  . Back pain   . Chronic kidney disease (CKD), stage III (moderate) (HCC)    [name] 01/05/2015  . Dementia (HCC)   . Depression   . Echocardiogram abnormal 02/26/11   MVP,mild MR,AOV mildly sclerotic. EF >55%  . GERD (gastroesophageal reflux disease)   . Hypertension   . Hypothyroidism   . Memory difficulty 06/05/2016  . Nausea & vomiting 11/2016  . Osteoporosis   . Osteoporosis   . Reactive airway disease   . S/P right hip fracture 06/2017   No surgery required  . Scoliosis   . Sleep apnea    "suppose to wear a mask; can't tolerate it" (01/05/2015)    Patient Active Problem List   Diagnosis Date Noted  . Femur fracture, right (HCC) 05/26/2017  . Hypokalemia 05/26/2017  . Severely underweight adult 01/02/2017  . Diarrhea   . Colitis   . Dehydration 11/18/2016  . AKI (acute kidney injury) (HCC) 11/18/2016  . Abdominal pain   . Intractable vomiting with nausea   . Renal insufficiency   . Insomnia 09/30/2016  . Memory difficulty  06/05/2016  . Abnormality of gait 12/21/2015  . Cytopenia 02/09/2015  . Protein-calorie malnutrition, severe (HCC) 01/07/2015  . Vomiting and diarrhea 01/05/2015  . Sleep walking 10/08/2014  . Restless legs 10/08/2014  . Essential hypertension 09/16/2014  . Chronic kidney disease, stage 3 (HCC) 09/16/2014  . Paroxysmal atrial tachycardia (HCC) 09/16/2014  . Hypothyroidism 09/16/2014  . Obstructive sleep apnea 07/18/2011    Past Surgical History:  Procedure Laterality Date  . CATARACT EXTRACTION, BILATERAL Bilateral   . CESAREAN SECTION  1967  . CESAREAN SECTION  1971  . DILATION AND CURETTAGE OF UTERUS    . FLEXIBLE SIGMOIDOSCOPY N/A 11/24/2016   Procedure: FLEXIBLE SIGMOIDOSCOPY;  Surgeon: Iva Boop, MD;  Location: [facility] ENDOSCOPY;  Service: Endoscopy;  Laterality: N/A;  . JOINT REPLACEMENT    . OPEN REDUCTION INTERNAL FIXATION (ORIF) DISTAL RADIAL FRACTURE Left 12/2009   [name] 12/28/2009  . TOTAL KNEE ARTHROPLASTY Left 03/2010   [name] 04/07/2010  . TUBAL LIGATION    . VAGINAL HYSTERECTOMY  1980     OB History   No obstetric history on file.      Home Medications    Prior to Admission medications   Medication Sig Start Date End Date Taking? Authorizing Provider  acetaminophen (TYLENOL) 325 MG tablet Take 650 mg by mouth 4 (  four) times daily.     [provider]  anti-nausea (EMETROL) solution Take 10 mLs by mouth every 15 (fifteen) minutes as needed for nausea or vomiting.    [provider]  arformoterol (BROVANA) 15 MCG/2ML NEBU Take 2 mLs (15 mcg total) by nebulization 2 (two) times daily. 03/18/18   Waymon Budge, MD  baclofen (LIORESAL) 10 MG tablet Take 10 mg by mouth at bedtime as needed for muscle spasms.     [provider]  budesonide (PULMICORT) 0.5 MG/2ML nebulizer solution Take 2 mLs (0.5 mg total) by nebulization 2 (two) times daily. 03/18/18   Jetty Duhamel D, MD  budesonide-formoterol (SYMBICORT) 160-4.5 MCG/ACT inhaler Inhale 2  puffs into the lungs 2 (two) times daily. Rinse mouth after use - discard 3 months after opening    [provider]  calcitRIOL (ROCALTROL) 0.25 MCG capsule Take 0.25 mcg by mouth every Monday, Wednesday, and Friday. Do not crush    [provider]  calcium-vitamin D (OSCAL WITH D) 250-125 MG-UNIT tablet Take 1 tablet by mouth 2 (two) times daily.    [provider]  clonazePAM (KLONOPIN) 2 MG tablet Take 1 tablet (2 mg total) by mouth at bedtime. 04/30/18   Jetty Duhamel D, MD  clopidogrel (PLAVIX) 75 MG tablet Take 1 tablet (75 mg total) by mouth daily. 06/05/16   York Spaniel, MD  diltiazem (CARDIZEM CD) 120 MG 24 hr capsule Take 120 mg by mouth daily. 05/21/17   [provider]  diltiazem (CARDIZEM) 30 MG tablet Take 30 mg by mouth at bedtime as needed (pulse greater than 110).    [provider]  diphenhydrAMINE (BENADRYL) 25 MG tablet Take 25 mg by mouth every 4 (four) hours as needed for itching (minor allergic reaction).    [provider]  guaifenesin (ROBITUSSIN) 100 MG/5ML syrup Take 200 mg by mouth every 4 (four) hours as needed for cough.    [provider]  levothyroxine (SYNTHROID, LEVOTHROID) 112 MCG tablet Take 112 mcg by mouth daily.     [provider]  lubiprostone (AMITIZA) 24 MCG capsule Take 24 mcg by mouth 2 (two) times daily with a meal.    [provider]  memantine (NAMENDA) 10 MG tablet Take 10 mg by mouth 2 (two) times daily.    [provider]  metoprolol tartrate (LOPRESSOR) 25 MG tablet Take 12.5 mg by mouth 2 (two) times daily.     [provider]  Omega-3 Fatty Acids (FISH OIL) 1000 MG CAPS Take 1,000 mg by mouth daily. Do not crush    [provider]  ondansetron (ZOFRAN) 4 MG tablet Take 4 mg by mouth every 8 (eight) hours as needed for nausea or vomiting.    [provider]  oxyCODONE (OXY IR/ROXICODONE) 5 MG immediate release tablet Take 1 tablet  (5 mg total) by mouth every 4 (four) hours as needed for breakthrough pain. Patient taking differently: Take 7.5 mg by mouth every 4 (four) hours as needed for breakthrough pain.  05/28/17   Meredeth Ide, MD  polyethylene glycol (MIRALAX / GLYCOLAX) packet Take 17 g by mouth daily. Mix in 8 oz of water and drink    [provider]  senna-docusate (SENOKOT-S) 8.6-50 MG tablet Take 1 tablet by mouth 2 (two) times daily. 05/28/17   Meredeth Ide, MD  sertraline (ZOLOFT) 100 MG tablet Take 150 mg by mouth daily.    [provider]  traZODone (DESYREL) 100 MG  tablet Take 1 tablet (100 mg total) by mouth at bedtime. 09/30/16   Waymon Budge, MD    Family History Family History  Problem Relation Age of Onset  . Cancer Mother   . Heart disease Father   . Cancer Father     Social History Social History   Tobacco Use  . Smoking status: Never Smoker  . Smokeless tobacco: Never Used  Substance Use Topics  . Alcohol use: No  . Drug use: No     Allergies   Vicodin [hydrocodone-acetaminophen]   Review of Systems Review of Systems  All other systems reviewed and are negative.    Physical Exam Updated Vital Signs BP 126/70   Pulse 65   Temp 99.3 F (37.4 C) (Oral)   Resp 18   SpO2 96%   Physical Exam Vitals signs and nursing note reviewed.  Constitutional:      General: She is not in acute distress.    Appearance: She is well-developed. She is not ill-appearing, toxic-appearing or diaphoretic.     Comments: Elderly, frail  HENT:     Head: Normocephalic.     Comments: Contusion and abrasion, left parietal occipital region.  No associated crepitation or deformity.    Right Ear: External ear normal.     Left Ear: External ear normal.  Eyes:     Conjunctiva/sclera: Conjunctivae normal.     Pupils: Pupils are equal, round, and reactive to light.  Neck:     Musculoskeletal: Normal range of motion and neck supple.     Trachea: Phonation normal.   Cardiovascular:     Rate and Rhythm: Normal rate and regular rhythm.     Heart sounds: Normal heart sounds.  Pulmonary:     Effort: Pulmonary effort is normal.     Breath sounds: Normal breath sounds.  Abdominal:     Palpations: Abdomen is soft.     Tenderness: There is no abdominal tenderness.  Musculoskeletal: Normal range of motion.        General: No swelling, tenderness, deformity or signs of injury.  Skin:    General: Skin is warm and dry.     Comments: Left dorsal forearm with chronic appearing rash characterized by raised papules, with dry skin.  Associated erythema or drainage.  Neurological:     Mental Status: She is alert and oriented to person, place, and time.     Cranial Nerves: No cranial nerve deficit.     Sensory: No sensory deficit.     Motor: No abnormal muscle tone.     Coordination: Coordination normal.  Psychiatric:        Mood and Affect: Mood normal.        Behavior: Behavior normal.      ED Treatments / Results  Labs (all labs ordered are listed, but only abnormal results are displayed) Labs Reviewed - No data to display  EKG None  Radiology Dg Chest 2 View  Result Date: 02/13/2019 CLINICAL DATA:  Trauma to back of head. EXAM: CHEST - 2 VIEW COMPARISON:  April 26, 2016 FINDINGS: The heart size and mediastinal contours are within normal limits. Both lungs are clear. The visualized skeletal structures are unremarkable. IMPRESSION: No active cardiopulmonary disease. Electronically Signed   By: Gerome Sam III M.D   On: 02/13/2019 16:00   Ct Head Wo Contrast  Result Date: 02/13/2019 CLINICAL DATA:  Fall, striking the back of the head. Small laceration along the back of the head. EXAM: CT HEAD WITHOUT  CONTRAST CT CERVICAL SPINE WITHOUT CONTRAST TECHNIQUE: Multidetector CT imaging of the head and cervical spine was performed following the standard protocol without intravenous contrast. Multiplanar CT image reconstructions of the cervical spine were also  generated. COMPARISON:  01/09/2018 FINDINGS: CT HEAD FINDINGS Brain: Stable linear calcification along the left globus pallidus nucleus. This is likely physiologic. Suspected remote lacunar infarct of the head of the left caudate nucleus. Periventricular white matter and corona radiata hypodensities favor chronic ischemic microvascular white matter disease. The brainstem, cerebellum, and thalami appear normal. Ventricular system and basilar cisterns unremarkable. No intracranial hemorrhage, mass lesion, or acute CVA. Vascular: There is atherosclerotic calcification of the cavernous carotid arteries bilaterally. Skull: Unremarkable Sinuses/Orbits: Unremarkable Other: Mild scalp soft tissue swelling along the left occipitoparietal region. CT CERVICAL SPINE FINDINGS Alignment: No vertebral subluxation is observed. Dextroconvex curvature at the cervicothoracic junction Skull base and vertebrae: Fused facet joints bilaterally at C2-3 with partial interbody fusion at C2-3 posteriorly. Loss of intervertebral disc height at all levels between C2 and C7 with some degenerative endplate sclerosis. No appreciable cervical spine fracture. Soft tissues and spinal canal: Hypodense right thyroid lesion 3.1 by 2.1 cm. Mild atherosclerotic calcification of the common carotid arteries. Disc levels: Facet and uncinate spurring leading to bony encroachment in the right neural foramina at C3-4, C4-5, and C5-6; and in the left neural foramen at C5-6. The most striking foraminal narrowing is on the right at C3-4. Upper chest: Mild ground-glass opacities are present in the posterior subpleural portion of the apical segment right upper lobe. This appears increased from 11/11/2015. Other: No supplemental non-categorized findings. IMPRESSION: 1. No acute intracranial findings or acute cervical spine findings. Mild left occipitoparietal scalp soft tissue swelling. 2. There is some mild posterior right apical ground-glass opacity which may be  incidental or due to low-grade alveolitis, but consider chest radiography to ensure lack of other findings. 3. Periventricular white matter and corona radiata hypodensities favor chronic ischemic microvascular white matter disease. 4. Foraminal impingement at several levels in the cervical spine, most notably on the right at C3-4. 5. Remote left caudate head lacunar infarct. 6. Atherosclerosis. 7. 3.1 by 2.1 cm hypodense right thyroid nodule. Consider further evaluation with thyroid ultrasound. If patient is clinically hyperthyroid, consider nuclear medicine thyroid uptake and scan. Electronically Signed   By: Gaylyn Rong M.D.   On: 02/13/2019 14:26   Ct Cervical Spine Wo Contrast  Result Date: 02/13/2019 CLINICAL DATA:  Fall, striking the back of the head. Small laceration along the back of the head. EXAM: CT HEAD WITHOUT CONTRAST CT CERVICAL SPINE WITHOUT CONTRAST TECHNIQUE: Multidetector CT imaging of the head and cervical spine was performed following the standard protocol without intravenous contrast. Multiplanar CT image reconstructions of the cervical spine were also generated. COMPARISON:  01/09/2018 FINDINGS: CT HEAD FINDINGS Brain: Stable linear calcification along the left globus pallidus nucleus. This is likely physiologic. Suspected remote lacunar infarct of the head of the left caudate nucleus. Periventricular white matter and corona radiata hypodensities favor chronic ischemic microvascular white matter disease. The brainstem, cerebellum, and thalami appear normal. Ventricular system and basilar cisterns unremarkable. No intracranial hemorrhage, mass lesion, or acute CVA. Vascular: There is atherosclerotic calcification of the cavernous carotid arteries bilaterally. Skull: Unremarkable Sinuses/Orbits: Unremarkable Other: Mild scalp soft tissue swelling along the left occipitoparietal region. CT CERVICAL SPINE FINDINGS Alignment: No vertebral subluxation is observed. Dextroconvex curvature  at the cervicothoracic junction Skull base and vertebrae: Fused facet joints bilaterally at C2-3 with partial interbody fusion at  C2-3 posteriorly. Loss of intervertebral disc height at all levels between C2 and C7 with some degenerative endplate sclerosis. No appreciable cervical spine fracture. Soft tissues and spinal canal: Hypodense right thyroid lesion 3.1 by 2.1 cm. Mild atherosclerotic calcification of the common carotid arteries. Disc levels: Facet and uncinate spurring leading to bony encroachment in the right neural foramina at C3-4, C4-5, and C5-6; and in the left neural foramen at C5-6. The most striking foraminal narrowing is on the right at C3-4. Upper chest: Mild ground-glass opacities are present in the posterior subpleural portion of the apical segment right upper lobe. This appears increased from 11/11/2015. Other: No supplemental non-categorized findings. IMPRESSION: 1. No acute intracranial findings or acute cervical spine findings. Mild left occipitoparietal scalp soft tissue swelling. 2. There is some mild posterior right apical ground-glass opacity which may be incidental or due to low-grade alveolitis, but consider chest radiography to ensure lack of other findings. 3. Periventricular white matter and corona radiata hypodensities favor chronic ischemic microvascular white matter disease. 4. Foraminal impingement at several levels in the cervical spine, most notably on the right at C3-4. 5. Remote left caudate head lacunar infarct. 6. Atherosclerosis. 7. 3.1 by 2.1 cm hypodense right thyroid nodule. Consider further evaluation with thyroid ultrasound. If patient is clinically hyperthyroid, consider nuclear medicine thyroid uptake and scan. Electronically Signed   By: Gaylyn Rong M.D.   On: 02/13/2019 14:26    Procedures Procedures (including critical care time)  Medications Ordered in ED Medications  acetaminophen (TYLENOL) tablet 650 mg (has no administration in time range)      Initial Impression / Assessment and Plan / ED Course  I have reviewed the triage vital signs and the nursing notes.  Pertinent labs & imaging results that were available during my care of the patient were reviewed by me and considered in my medical decision making (see chart for details).  Clinical Course as of Feb 13 1623  Sat Feb 13, 2019  1449 No intracranial injury or deformity.  Images reviewed by me  CT Head Wo Contrast [EW]  1449 Generative changes without fracture or dislocation, images reviewed by me  CT Cervical Spine Wo Contrast [EW]  1526 Findings discussed with patient. She declines APAP for HA now.   [EW]  1619 No infiltrate or CHF, images reviewed by me  DG Chest 2 View [EW]    Clinical Course User Index [EW] Mancel Bale, MD        Patient Vitals for the past 24 hrs:  BP Temp Temp src Pulse Resp SpO2  02/13/19 1330 126/70 - - 65 18 96 %  02/13/19 1315 114/62 - - 65 18 95 %  02/13/19 1300 (!) 109/96 - - 67 19 97 %  02/13/19 1245 126/64 - - 69 16 99 %  02/13/19 1239 125/64 99.3 F (37.4 C) Oral 68 17 97 %    4:20 PM Reevaluation with update and discussion. After initial assessment and treatment, an updated evaluation reveals she remains stable and comfortable, findings discussed and questions answered. Mancel Bale   Medical Decision Making: Fall with contusion head and head injury.  Doubt serious intracranial abnormality.  Abrasion scalp not requiring closure.  Screening chest x-ray done for possible abnormality on cervical spine imaging, with normal findings on chest x-ray.  Doubt serious bacterial infection, or impending vascular collapse.  CRITICAL CARE-no Performed by: Mancel Bale  Nursing Notes Reviewed/ Care Coordinated Applicable Imaging Reviewed Interpretation of Laboratory Data incorporated into ED treatment  The  patient appears reasonably screened and/or stabilized for discharge and I doubt any other medical condition or other [facility]  requiring further screening, evaluation, or treatment in the ED at this time prior to discharge.  Plan: Home Medications-continue usual; Home Treatments-wound care; return here if the recommended treatment, does not improve the symptoms; Recommended follow up-rest, fluids   Final Clinical Impressions(s) / ED Diagnoses   Final diagnoses:  Fall, initial encounter  Injury of head, initial encounter  Abrasion    ED Discharge Orders    None       Mancel Bale, MD 02/13/19 1624

## 2019-02-13 NOTE — Discharge Instructions (Signed)
There were no serious injuries from the fall today.  Use ice on the sore area of your scalp, for pain and swelling.  Clean the wound of your scalp with soap and water daily.  See your doctor or return here for problems.

## 2019-02-13 NOTE — ED Notes (Signed)
Phone call received from [facility] [place], states they will send transportation to pick up Pt.

## 2019-03-11 ENCOUNTER — Telehealth: Payer: Self-pay

## 2019-03-11 NOTE — Telephone Encounter (Signed)
Spoke with the patient and she stated that she doesn't have anyone to help her the webex visit. She has given verbal consent to file her insurance and to do a telephone visit

## 2019-03-16 ENCOUNTER — Other Ambulatory Visit: Payer: Self-pay

## 2019-03-16 ENCOUNTER — Encounter: Payer: Medicare Other | Admitting: Neurology

## 2019-03-16 ENCOUNTER — Telehealth: Payer: Self-pay | Admitting: Neurology

## 2019-03-16 ENCOUNTER — Ambulatory Visit: Payer: Self-pay | Admitting: Adult Health

## 2019-03-16 NOTE — Progress Notes (Signed)
This encounter was created in error - please disregard.

## 2019-03-16 NOTE — Telephone Encounter (Signed)
Had a telephone call scheduled for March 16, 2019 at 215, was not able to reach the patient, no option to leave a voicemail.

## 2019-03-23 ENCOUNTER — Encounter (HOSPITAL_COMMUNITY): Payer: Self-pay

## 2019-03-23 ENCOUNTER — Emergency Department (HOSPITAL_COMMUNITY)
Admission: EM | Admit: 2019-03-23 | Discharge: 2019-03-23 | Disposition: A | Discharge status: Home / Self Care | Payer: Medicare Other | Attending: Emergency Medicine | Admitting: Emergency Medicine

## 2019-03-23 ENCOUNTER — Other Ambulatory Visit: Payer: Self-pay

## 2019-03-23 ENCOUNTER — Emergency Department (HOSPITAL_COMMUNITY): Payer: Medicare Other

## 2019-03-23 DIAGNOSIS — Y999 Unspecified external cause status: Secondary | ICD-10-CM | POA: Diagnosis not present

## 2019-03-23 DIAGNOSIS — I129 Hypertensive chronic kidney disease with stage 1 through stage 4 chronic kidney disease, or unspecified chronic kidney disease: Secondary | ICD-10-CM | POA: Diagnosis not present

## 2019-03-23 DIAGNOSIS — S20211A Contusion of right front wall of thorax, initial encounter: Secondary | ICD-10-CM | POA: Insufficient documentation

## 2019-03-23 DIAGNOSIS — Z79899 Other long term (current) drug therapy: Secondary | ICD-10-CM | POA: Diagnosis not present

## 2019-03-23 DIAGNOSIS — W050XXA Fall from non-moving wheelchair, initial encounter: Secondary | ICD-10-CM | POA: Insufficient documentation

## 2019-03-23 DIAGNOSIS — E039 Hypothyroidism, unspecified: Secondary | ICD-10-CM | POA: Insufficient documentation

## 2019-03-23 DIAGNOSIS — F0390 Unspecified dementia without behavioral disturbance: Secondary | ICD-10-CM | POA: Diagnosis not present

## 2019-03-23 DIAGNOSIS — Y939 Activity, unspecified: Secondary | ICD-10-CM | POA: Insufficient documentation

## 2019-03-23 DIAGNOSIS — N183 Chronic kidney disease, stage 3 (moderate): Secondary | ICD-10-CM | POA: Insufficient documentation

## 2019-03-23 DIAGNOSIS — S0081XA Abrasion of other part of head, initial encounter: Secondary | ICD-10-CM | POA: Diagnosis not present

## 2019-03-23 DIAGNOSIS — S0083XA Contusion of other part of head, initial encounter: Secondary | ICD-10-CM | POA: Diagnosis not present

## 2019-03-23 DIAGNOSIS — Y92129 Unspecified place in nursing home as the place of occurrence of the external cause: Secondary | ICD-10-CM | POA: Insufficient documentation

## 2019-03-23 DIAGNOSIS — R51 Headache: Secondary | ICD-10-CM | POA: Insufficient documentation

## 2019-03-23 DIAGNOSIS — W19XXXA Unspecified fall, initial encounter: Secondary | ICD-10-CM

## 2019-03-23 MED ORDER — ACETAMINOPHEN 325 MG PO TABS: 650.0000 mg | ORAL_TABLET | Freq: Four times a day (QID) | ORAL | Status: DC | PRN

## 2019-03-23 NOTE — ED Notes (Signed)
[facility] [place] contacted, report given to [place], Charity fundraiser

## 2019-03-23 NOTE — ED Notes (Signed)
Pt given [place] sandwich and coke. Pt updated on plan of care to discharge.

## 2019-03-23 NOTE — ED Triage Notes (Addendum)
Pt states she leaned too far over in the wheelchair and fell over. States' I pass out all the time" Denies passing out this time. No anticoags listed on facility med list. From [facility] [address]

## 2019-03-23 NOTE — ED Notes (Signed)
Ptar called for pt 

## 2019-03-23 NOTE — ED Provider Notes (Signed)
[facility] EMERGENCY [facility]PART[id number] Ar[facility] date & time: 03/23/19  1857    History   Chief Complaint Chief Complaint  Patient presents with  . Fall    HPI 78 year old female with a history of arthritis, CKD, dementia, HTN, hypothyroidism presents for evaluation after a fall.  Patient was attempting to transfer from her wheelchair when she fell forward and hit the right side of her head.  No loss of consciousness.  She has been taking normally since the fall.  Patient is multiple bruises on bilateral upper extremities and the right chest wall which she states are from prior falls.  Denies vision changes.  She has mild right-sided vision changes.  Denies extremity weakness.  Patient is on Plavix but no other systemic anticoagulation.   Past Medical History:  Diagnosis Date  . Abnormal heart rhythm   . Abnormality of gait 12/21/2015  . Afib (HCC)    h/o  . Anxiety   . Arthritis    "hips, shoulders" (01/05/2015)  . Back pain   . Chronic kidney disease (CKD), stage III (moderate) (HCC)    [name] 01/05/2015  . Dementia (HCC)   . Depression   . Echocardiogram abnormal 02/26/11   MVP,mild MR,AOV mildly sclerotic. EF >55%  . GERD (gastroesophageal reflux disease)   . Hypertension   . Hypothyroidism   . Memory difficulty 06/05/2016  . Nausea & vomiting 11/2016  . Osteoporosis   . Osteoporosis   . Reactive airway disease   . S/P right hip fracture 06/2017   No surgery required  . Scoliosis   . Sleep apnea    "suppose to wear a mask; can't tolerate it" (01/05/2015)    Patient Active Problem List   Diagnosis Date Noted  . Femur fracture, right (HCC) 05/26/2017  . Hypokalemia 05/26/2017  . Severely underweight adult 01/02/2017  . Diarrhea   . Colitis   . Dehydration 11/18/2016  . AKI (acute kidney injury) (HCC) 11/18/2016  . Abdominal pain   . Intractable vomiting with nausea   . Renal insufficiency   . Insomnia 09/30/2016  .  Memory difficulty 06/05/2016  . Abnormality of gait 12/21/2015  . Cytopenia 02/09/2015  . Protein-calorie malnutrition, severe (HCC) 01/07/2015  . Vomiting and diarrhea 01/05/2015  . Sleep walking 10/08/2014  . Restless legs 10/08/2014  . Essential hypertension 09/16/2014  . Chronic kidney disease, stage 3 (HCC) 09/16/2014  . Paroxysmal atrial tachycardia (HCC) 09/16/2014  . Hypothyroidism 09/16/2014  . Obstructive sleep apnea 07/18/2011    Past Surgical History:  Procedure Laterality Date  . CATARACT EXTRACTION, BILATERAL Bilateral   . CESAREAN SECTION  1967  . CESAREAN SECTION  1971  . DILATION AND CURETTAGE OF UTERUS    . FLEXIBLE SIGMOIDOSCOPY N/A 11/24/2016   Procedure: FLEXIBLE SIGMOIDOSCOPY;  Surgeon: Iva Boop, MD;  Location: [facility] ENDOSCOPY;  Service: Endoscopy;  Laterality: N/A;  . JOINT REPLACEMENT    . OPEN REDUCTION INTERNAL FIXATION (ORIF) DISTAL RADIAL FRACTURE Left 12/2009   [name] 12/28/2009  . TOTAL KNEE ARTHROPLASTY Left 03/2010   [name] 04/07/2010  . TUBAL LIGATION    . VAGINAL HYSTERECTOMY  1980     OB History   No obstetric history on file.      Home Medications    Prior to Admission medications   Medication Sig Start Date End Date Taking? Authorizing Provider  acetaminophen (TYLENOL) 325 MG tablet Take 650 mg by mouth 4 (four) times daily.  [provider]  anti-nausea (EMETROL) solution Take 10 mLs by mouth every 15 (fifteen) minutes as needed for nausea or vomiting.    [provider]  arformoterol (BROVANA) 15 MCG/2ML NEBU Take 2 mLs (15 mcg total) by nebulization 2 (two) times daily. 03/18/18   Waymon Budge, MD  baclofen (LIORESAL) 10 MG tablet Take 10 mg by mouth at bedtime as needed for muscle spasms.     [provider]  budesonide (PULMICORT) 0.5 MG/2ML nebulizer solution Take 2 mLs (0.5 mg total) by nebulization 2 (two) times daily. 03/18/18   Jetty Duhamel D, MD  budesonide-formoterol (SYMBICORT) 160-4.5 MCG/ACT  inhaler Inhale 2 puffs into the lungs 2 (two) times daily. Rinse mouth after use - discard 3 months after opening    [provider]  calcitRIOL (ROCALTROL) 0.25 MCG capsule Take 0.25 mcg by mouth every Monday, Wednesday, and Friday. Do not crush    [provider]  calcium-vitamin D (OSCAL WITH D) 250-125 MG-UNIT tablet Take 1 tablet by mouth 2 (two) times daily.    [provider]  clonazePAM (KLONOPIN) 2 MG tablet Take 1 tablet (2 mg total) by mouth at bedtime. 04/30/18   Jetty Duhamel D, MD  clopidogrel (PLAVIX) 75 MG tablet Take 1 tablet (75 mg total) by mouth daily. 06/05/16   York Spaniel, MD  diltiazem (CARDIZEM CD) 120 MG 24 hr capsule Take 120 mg by mouth daily. 05/21/17   [provider]  diltiazem (CARDIZEM) 30 MG tablet Take 30 mg by mouth at bedtime as needed (pulse greater than 110).    [provider]  diphenhydrAMINE (BENADRYL) 25 MG tablet Take 25 mg by mouth every 4 (four) hours as needed for itching (minor allergic reaction).    [provider]  guaifenesin (ROBITUSSIN) 100 MG/5ML syrup Take 200 mg by mouth every 4 (four) hours as needed for cough.    [provider]  levothyroxine (SYNTHROID, LEVOTHROID) 112 MCG tablet Take 112 mcg by mouth daily.     [provider]  lubiprostone (AMITIZA) 24 MCG capsule Take 24 mcg by mouth 2 (two) times daily with a meal.    [provider]  memantine (NAMENDA) 10 MG tablet Take 10 mg by mouth 2 (two) times daily.    [provider]  metoprolol tartrate (LOPRESSOR) 25 MG tablet Take 12.5 mg by mouth 2 (two) times daily.     [provider]  Omega-3 Fatty Acids (FISH OIL) 1000 MG CAPS Take 1,000 mg by mouth daily. Do not crush    [provider]  ondansetron (ZOFRAN) 4 MG tablet Take 4 mg by mouth every 8 (eight) hours as needed for nausea or vomiting.    [provider]  oxyCODONE (OXY IR/ROXICODONE) 5 MG immediate release  tablet Take 1 tablet (5 mg total) by mouth every 4 (four) hours as needed for breakthrough pain. Patient taking differently: Take 7.5 mg by mouth every 4 (four) hours as needed for breakthrough pain.  05/28/17   Meredeth Ide, MD  polyethylene glycol (MIRALAX / GLYCOLAX) packet Take 17 g by mouth daily. Mix in 8 oz of water and drink    [provider]  senna-docusate (SENOKOT-S) 8.6-50 MG tablet Take 1 tablet by mouth 2 (two) times daily. 05/28/17   Meredeth Ide, MD  sertraline (ZOLOFT) 100 MG tablet Take 150 mg by mouth daily.    [provider]  traZODone (DESYREL) 100 MG tablet Take 1 tablet (100 mg total)  by mouth at bedtime. 09/30/16   Waymon Budge, MD    Family History Family History  Problem Relation Age of Onset  . Cancer Mother   . Heart disease Father   . Cancer Father     Social History Social History   Tobacco Use  . Smoking status: Never Smoker  . Smokeless tobacco: Never Used  Substance Use Topics  . Alcohol use: No  . Drug use: No     Allergies   Vicodin [hydrocodone-acetaminophen]   Review of Systems Review of Systems  Constitutional: Negative for chills and fever.  HENT: Negative for ear pain and sore throat.   Eyes: Negative for pain and visual disturbance.  Respiratory: Negative for cough and shortness of breath.   Cardiovascular: Negative for chest pain and palpitations.  Gastrointestinal: Negative for abdominal pain and vomiting.  Genitourinary: Negative for dysuria and hematuria.  Musculoskeletal: Negative for arthralgias and back pain.  Skin: Negative for color change and rash.  Neurological: Positive for headaches. Negative for seizures and syncope.  All other systems reviewed and are negative.    Physical Exam Updated Vital Signs BP 123/61   Pulse 73   Temp 99 F (37.2 C) (Oral)   Resp 18   Ht 5\' 5"  (1.651 m)   Wt 49.9 kg   SpO2 96%   BMI 18.30 kg/m   Physical Exam Vitals signs and nursing note reviewed.   Constitutional:      General: She is not in acute distress.    Appearance: She is well-developed.  HENT:     Head: Normocephalic and atraumatic.  Eyes:     Conjunctiva/sclera: Conjunctivae normal.  Neck:     Musculoskeletal: Neck supple.  Cardiovascular:     Rate and Rhythm: Normal rate and regular rhythm.     Heart sounds: No murmur.  Pulmonary:     Effort: Pulmonary effort is normal. No respiratory distress.     Breath sounds: Normal breath sounds.  Abdominal:     Palpations: Abdomen is soft.     Tenderness: There is no abdominal tenderness.  Skin:    General: Skin is warm and dry.     Comments: Multiple bruises on bilateral upper extremities Bruising over the right ribs  Neurological:     Mental Status: She is alert.     Comments:  Mental Status:  Orientation: Alert and oriented to person, place, and time.  Memory: Cooperative, follows commands well. Recent and remote memory normal.  Attention, Concentration: Attention span and concentration are normal.  Language: Speech is clear and language is normal.  Fund of Knowledge: Aware of current events, vocabulary appropriate for patient age.  Cranial Nerves   II Visual Fields:  Intact to confrontation III, IV, VI:  Pupils equal and reactive to light and near. Full eye movement without nystagmus  V Facial Sensation:  Normal. No weakness of masticatory muscles  VII:  No facial weakness or asymmetry  VIII Auditory Acuity:  Grossly normal  IX/X:  The uvula is midline; the palate elevates symmetrically  XI:  Normal sternocleidomastoid and trapezius strength  XII:  The tongue is midline. No atrophy or fasciculations.    Motor System:  Muscle Strength: 5/5 and symmetric in the upper and lower extremities. No pronation or drift.  Muscle Tone: Tone and muscle bulk are normal in the upper and lower extremities.   Coordination:  Intact finger-to-nose, heel-to-shin, and rapid alternating movements. No tremor.  Sensation:  Intact to  light touch Gait:  Routine and tandem gait are normal  Other:          ED Treatments / Results  Labs (all labs ordered are listed, but only abnormal results are displayed) Labs Reviewed - No data to display  EKG None  Radiology Dg Ribs Unilateral W/chest Right  Result Date: 03/23/2019 CLINICAL DATA:  Bruising.  Right-sided rib pain.  Recent fall. EXAM: RIGHT RIBS AND CHEST - 3+ VIEW COMPARISON:  02/13/2019 FINDINGS: No fracture or other bone lesions are seen involving the ribs. There is no evidence of pneumothorax or pleural effusion. Both lungs are clear. Heart size and mediastinal contours are within normal limits. Degenerative changes are noted throughout the thoracolumbar spine with an S shaped curvature similar to prior studies. IMPRESSION: No displaced right-sided rib fracture.  No pneumothorax. Electronically Signed   By: Katherine Mantlehristopher  Green M.D.   On: 03/23/2019 20:58   Ct Head Wo Contrast  Result Date: 03/23/2019 CLINICAL DATA:  Fall from wheelchair, headache. EXAM: CT HEAD WITHOUT CONTRAST TECHNIQUE: Contiguous axial images were obtained from the base of the skull through the vertex without intravenous contrast. COMPARISON:  02/13/2019 FINDINGS: Brain: Extensive chronic microvascular disease throughout the deep white matter. Mild cerebral atrophy. No acute intracranial abnormality. Specifically, no hemorrhage, hydrocephalus, mass lesion, acute infarction, or significant intracranial injury. Vascular: No hyperdense vessel or unexpected calcification. Skull: No acute calvarial abnormality. Sinuses/Orbits: Visualized paranasal sinuses and mastoids clear. Orbital soft tissues unremarkable. Other: None IMPRESSION: Atrophy, chronic microvascular disease. No acute intracranial abnormality. Electronically Signed   By: Charlett NoseKevin  Dover M.D.   On: 03/23/2019 21:08    Procedures Procedures (including critical care time)  Medications Ordered in ED Medications  acetaminophen (TYLENOL) tablet  650 mg (has no administration in time range)     Initial Impression / Assessment and Plan / ED Course  I have reviewed the triage vital signs and the nursing notes.  Pertinent labs & imaging results that were available during my care of the patient were reviewed by me and considered in my medical decision making (see chart for details).  78 year old female with a history of arthritis, CKD, dementia, HTN, hypothyroidism presents for evaluation after a fall.  Patient is hemodynamically stable.  Afebrile.  Alert and oriented with a nonfocal neuro exam.  No midline cervical, thoracic, or lumbar tenderness.  CT head shows atrophy and chronic microvascular disease but no acute intracranial abnormality.  Chest x-ray shows no acute displaced right-sided rib fractures.  No pneumothorax.  Patient discharged back to nursing facility.  Final Clinical Impressions(s) / ED Diagnoses   Final diagnoses:  Fall, initial encounter    ED Discharge Orders    None       Vallery RidgeKrebs, Makael Stein, MD 03/23/19 [phone]    Eber HongMiller, Brian, MD 03/29/19 [phone]

## 2019-03-23 NOTE — ED Triage Notes (Signed)
Pt arrives [facility] from [facility] with c/o fall from standing. Pt denies LOC or complaint. States struck right side of head.  Pt arrives alert and oriented.

## 2019-03-23 NOTE — ED Provider Notes (Signed)
I saw and evaluated the patient, reviewed the resident's note and I agree with the findings and plan.  Pertinent History: fell forward out of wheelchair - has a small abrasion and small hematoma on the right side to her forehead but no hematoma, non focal neuro, she is able to follow commands - has some baseline dementia.  Also has some bruising to the ribs on the right.  Does not seem to be short of breath  Medical / trauma evaluation  Patient desires further evaluation with CT scan of the brain and states that she would want surgery if she was having a traumatic brain injury that required it for survival.   Final diagnoses:  Fall, initial encounter      Eber Hong, MD 03/29/19 [phone]

## 2019-03-23 NOTE — ED Notes (Signed)
[name] pts daughter [phone]

## 2019-04-08 ENCOUNTER — Emergency Department (HOSPITAL_COMMUNITY): Payer: Medicare Other

## 2019-04-08 ENCOUNTER — Other Ambulatory Visit: Payer: Self-pay

## 2019-04-08 ENCOUNTER — Encounter (HOSPITAL_COMMUNITY): Payer: Self-pay | Admitting: Emergency Medicine

## 2019-04-08 ENCOUNTER — Emergency Department (HOSPITAL_COMMUNITY)
Admission: EM | Admit: 2019-04-08 | Discharge: 2019-04-08 | Disposition: A | Discharge status: Home / Self Care | Payer: Medicare Other | Attending: Emergency Medicine | Admitting: Emergency Medicine

## 2019-04-08 DIAGNOSIS — S52352A Displaced comminuted fracture of shaft of radius, left arm, initial encounter for closed fracture: Secondary | ICD-10-CM | POA: Insufficient documentation

## 2019-04-08 DIAGNOSIS — R079 Chest pain, unspecified: Secondary | ICD-10-CM | POA: Diagnosis not present

## 2019-04-08 DIAGNOSIS — M25552 Pain in left hip: Secondary | ICD-10-CM | POA: Insufficient documentation

## 2019-04-08 DIAGNOSIS — Z96652 Presence of left artificial knee joint: Secondary | ICD-10-CM | POA: Diagnosis not present

## 2019-04-08 DIAGNOSIS — S51012A Laceration without foreign body of left elbow, initial encounter: Secondary | ICD-10-CM | POA: Insufficient documentation

## 2019-04-08 DIAGNOSIS — S6992XA Unspecified injury of left wrist, hand and finger(s), initial encounter: Secondary | ICD-10-CM | POA: Diagnosis present

## 2019-04-08 DIAGNOSIS — W050XXA Fall from non-moving wheelchair, initial encounter: Secondary | ICD-10-CM | POA: Diagnosis not present

## 2019-04-08 DIAGNOSIS — I129 Hypertensive chronic kidney disease with stage 1 through stage 4 chronic kidney disease, or unspecified chronic kidney disease: Secondary | ICD-10-CM | POA: Insufficient documentation

## 2019-04-08 DIAGNOSIS — Y9389 Activity, other specified: Secondary | ICD-10-CM | POA: Diagnosis not present

## 2019-04-08 DIAGNOSIS — Z79899 Other long term (current) drug therapy: Secondary | ICD-10-CM | POA: Insufficient documentation

## 2019-04-08 DIAGNOSIS — N183 Chronic kidney disease, stage 3 (moderate): Secondary | ICD-10-CM | POA: Insufficient documentation

## 2019-04-08 DIAGNOSIS — Z7902 Long term (current) use of antithrombotics/antiplatelets: Secondary | ICD-10-CM | POA: Insufficient documentation

## 2019-04-08 DIAGNOSIS — Y929 Unspecified place or not applicable: Secondary | ICD-10-CM | POA: Diagnosis not present

## 2019-04-08 DIAGNOSIS — Y999 Unspecified external cause status: Secondary | ICD-10-CM | POA: Diagnosis not present

## 2019-04-08 DIAGNOSIS — S52351A Displaced comminuted fracture of shaft of radius, right arm, initial encounter for closed fracture: Secondary | ICD-10-CM

## 2019-04-08 DIAGNOSIS — F0390 Unspecified dementia without behavioral disturbance: Secondary | ICD-10-CM | POA: Diagnosis not present

## 2019-04-08 DIAGNOSIS — T148XXA Other injury of unspecified body region, initial encounter: Secondary | ICD-10-CM

## 2019-04-08 DIAGNOSIS — M25551 Pain in right hip: Secondary | ICD-10-CM | POA: Diagnosis not present

## 2019-04-08 DIAGNOSIS — E039 Hypothyroidism, unspecified: Secondary | ICD-10-CM | POA: Insufficient documentation

## 2019-04-08 DIAGNOSIS — W19XXXA Unspecified fall, initial encounter: Secondary | ICD-10-CM

## 2019-04-08 DIAGNOSIS — R262 Difficulty in walking, not elsewhere classified: Secondary | ICD-10-CM

## 2019-04-08 MED ORDER — ONDANSETRON HCL 4 MG/2ML IJ SOLN
4.0000 mg | Freq: Once | INTRAMUSCULAR | Status: AC
  Administered 2019-04-08: 4 mg via INTRAVENOUS
  Filled 2019-04-08: qty 2

## 2019-04-08 MED ORDER — TRAMADOL HCL 50 MG PO TABS: 50.0000 mg | ORAL_TABLET | Freq: Four times a day (QID) | ORAL | 0 refills | Status: DC | PRN

## 2019-04-08 MED ORDER — PROPOFOL 10 MG/ML IV BOLUS
0.5000 mg/kg | Freq: Once | INTRAVENOUS | Status: AC
  Administered 2019-04-08: 24.5 mg via INTRAVENOUS
  Filled 2019-04-08: qty 20

## 2019-04-08 MED ORDER — MORPHINE SULFATE (PF) 4 MG/ML IV SOLN
4.0000 mg | Freq: Once | INTRAVENOUS | Status: AC
  Administered 2019-04-08: 4 mg via INTRAVENOUS
  Filled 2019-04-08: qty 1

## 2019-04-08 NOTE — ED Notes (Signed)
Consent signed and placed in med records.

## 2019-04-08 NOTE — ED Notes (Signed)
Daughter [name] updated about procedure, will call this RN back with plan on getting pt back to facility.

## 2019-04-08 NOTE — ED Provider Notes (Signed)
[facility] EMERGENCY DEPARTMENT Provider Note   CSN: [id number] Arrival date & time: 04/08/19  1756    History   Chief Complaint Chief Complaint  Patient presents with   Fall    HPI [name] is a 78 y.o. female.     Pt presents to the ED today after a fall.  She was trying to get up out of her wheelchair when she became a little dizzy and fell down.  She did not hit her head of have a loc.  She c/o 15/10 pain to her left wrist.  She does have a hx of frequent falls.         Past Medical History:  Diagnosis Date   Abnormal heart rhythm    Abnormality of gait 12/21/2015   Afib (HCC)    h/o   Anxiety    Arthritis    "hips, shoulders" (01/05/2015)   Back pain    Chronic kidney disease (CKD), stage III (moderate) (HCC)    [name] 01/05/2015   Dementia (HCC)    Depression    Echocardiogram abnormal 02/26/11   MVP,mild MR,AOV mildly sclerotic. EF >55%   GERD (gastroesophageal reflux disease)    Hypertension    Hypothyroidism    Memory difficulty 06/05/2016   Nausea & vomiting 11/2016   Osteoporosis    Osteoporosis    Reactive airway disease    S/P right hip fracture 06/2017   No surgery required   Scoliosis    Sleep apnea    "suppose to wear a mask; can't tolerate it" (01/05/2015)    Patient Active Problem List   Diagnosis Date Noted   Femur fracture, right (HCC) 05/26/2017   Hypokalemia 05/26/2017   Severely underweight adult 01/02/2017   Diarrhea    Colitis    Dehydration 11/18/2016   AKI (acute kidney injury) (HCC) 11/18/2016   Abdominal pain    Intractable vomiting with nausea    Renal insufficiency    Insomnia 09/30/2016   Memory difficulty 06/05/2016   Abnormality of gait 12/21/2015   Cytopenia 02/09/2015   Protein-calorie malnutrition, severe (HCC) 01/07/2015   Vomiting and diarrhea 01/05/2015   Sleep walking 10/08/2014   Restless legs 10/08/2014   Essential hypertension 09/16/2014     Chronic kidney disease, stage 3 (HCC) 09/16/2014   Paroxysmal atrial tachycardia (HCC) 09/16/2014   Hypothyroidism 09/16/2014   Obstructive sleep apnea 07/18/2011    Past Surgical History:  Procedure Laterality Date   CATARACT EXTRACTION, BILATERAL Bilateral    CESAREAN SECTION  1967   CESAREAN SECTION  1971   DILATION AND CURETTAGE OF UTERUS     FLEXIBLE SIGMOIDOSCOPY N/A 11/24/2016   Procedure: FLEXIBLE SIGMOIDOSCOPY;  Surgeon: Iva Boop, MD;  Location: [facility] ENDOSCOPY;  Service: Endoscopy;  Laterality: N/A;   JOINT REPLACEMENT     OPEN REDUCTION INTERNAL FIXATION (ORIF) DISTAL RADIAL FRACTURE Left 12/2009   [name] 12/28/2009   TOTAL KNEE ARTHROPLASTY Left 03/2010   [name] 04/07/2010   TUBAL LIGATION     VAGINAL HYSTERECTOMY  1980     OB History   No obstetric history on file.      Home Medications    Prior to Admission medications   Medication Sig Start Date End Date Taking? Authorizing Provider  acetaminophen (TYLENOL) 325 MG tablet Take 650 mg by mouth 4 (four) times daily.     [provider]  anti-nausea (EMETROL) solution Take 10 mLs by mouth every 15 (fifteen) minutes as needed for nausea or  vomiting.    [provider]  arformoterol (BROVANA) 15 MCG/2ML NEBU Take 2 mLs (15 mcg total) by nebulization 2 (two) times daily. 03/18/18   Waymon Budge, MD  baclofen (LIORESAL) 10 MG tablet Take 10 mg by mouth at bedtime as needed for muscle spasms.     [provider]  budesonide (PULMICORT) 0.5 MG/2ML nebulizer solution Take 2 mLs (0.5 mg total) by nebulization 2 (two) times daily. 03/18/18   Jetty Duhamel D, MD  budesonide-formoterol (SYMBICORT) 160-4.5 MCG/ACT inhaler Inhale 2 puffs into the lungs 2 (two) times daily. Rinse mouth after use - discard 3 months after opening    [provider]  calcitRIOL (ROCALTROL) 0.25 MCG capsule Take 0.25 mcg by mouth every Monday, Wednesday, and Friday. Do not crush    [provider]  calcium-vitamin D (OSCAL WITH D) 250-125 MG-UNIT tablet Take 1 tablet by mouth 2 (two) times daily.    [provider]  clonazePAM (KLONOPIN) 2 MG tablet Take 1 tablet (2 mg total) by mouth at bedtime. 04/30/18   Jetty Duhamel D, MD  clopidogrel (PLAVIX) 75 MG tablet Take 1 tablet (75 mg total) by mouth daily. 06/05/16   York Spaniel, MD  diltiazem (CARDIZEM CD) 120 MG 24 hr capsule Take 120 mg by mouth daily. 05/21/17   [provider]  diltiazem (CARDIZEM) 30 MG tablet Take 30 mg by mouth at bedtime as needed (pulse greater than 110).    [provider]  diphenhydrAMINE (BENADRYL) 25 MG tablet Take 25 mg by mouth every 4 (four) hours as needed for itching (minor allergic reaction).    [provider]  guaifenesin (ROBITUSSIN) 100 MG/5ML syrup Take 200 mg by mouth every 4 (four) hours as needed for cough.    [provider]  levothyroxine (SYNTHROID, LEVOTHROID) 112 MCG tablet Take 112 mcg by mouth daily.     [provider]  lubiprostone (AMITIZA) 24 MCG capsule Take 24 mcg by mouth 2 (two) times daily with a meal.    [provider]  memantine (NAMENDA) 10 MG tablet Take 10 mg by mouth 2 (two) times daily.    [provider]  metoprolol tartrate (LOPRESSOR) 25 MG tablet Take 12.5 mg by mouth 2 (two) times daily.     [provider]  Omega-3 Fatty Acids (FISH OIL) 1000 MG CAPS Take 1,000 mg by mouth daily. Do not crush    [provider]  ondansetron (ZOFRAN) 4 MG tablet Take 4 mg by mouth every 8 (eight) hours as needed for nausea or vomiting.    [provider]  oxyCODONE (OXY IR/ROXICODONE) 5 MG immediate release tablet Take 1 tablet (5 mg total) by mouth every 4 (four) hours as needed for breakthrough pain. Patient taking differently: Take 7.5 mg by mouth every 4 (four) hours as needed for breakthrough pain.  05/28/17   Meredeth Ide, MD  polyethylene glycol (MIRALAX / GLYCOLAX)  packet Take 17 g by mouth daily. Mix in 8 oz of water and drink    [provider]  senna-docusate (SENOKOT-S) 8.6-50 MG tablet Take 1 tablet by mouth 2 (two) times daily. 05/28/17   Meredeth Ide, MD  sertraline (ZOLOFT) 100 MG tablet Take 150 mg by mouth daily.    [provider]  traMADol (ULTRAM) 50 MG tablet Take 1 tablet (50 mg total) by mouth every 6 (six) hours as needed. 04/08/19   Jacalyn Lefevre, MD  traZODone (DESYREL) 100 MG tablet Take  1 tablet (100 mg total) by mouth at bedtime. 09/30/16   Waymon Budge, MD    Family History Family History  Problem Relation Age of Onset   Cancer Mother    Heart disease Father    Cancer Father     Social History Social History   Tobacco Use   Smoking status: Never Smoker   Smokeless tobacco: Never Used  Substance Use Topics   Alcohol use: No   Drug use: No     Allergies   Vicodin [hydrocodone-acetaminophen]   Review of Systems Review of Systems  Musculoskeletal:       Left wrist pain  All other systems reviewed and are negative.    Physical Exam Updated Vital Signs BP 136/76    Pulse 84    Temp 98.5 F (36.9 C) (Oral) Comment: Simultaneous filing. User may not have seen previous data. Comment (Src): Simultaneous filing. User may not have seen previous data.   Resp 19    Ht 5\' 5"  (1.651 m)    Wt 49 kg    SpO2 99%    BMI 17.98 kg/m   Physical Exam Vitals signs and nursing note reviewed.  Constitutional:      Appearance: Normal appearance.  HENT:     Head: Normocephalic and atraumatic.     Right Ear: External ear normal.     Left Ear: External ear normal.     Nose: Nose normal.     Mouth/Throat:     Mouth: Mucous membranes are moist.     Pharynx: Oropharynx is clear.  Eyes:     Extraocular Movements: Extraocular movements intact.     Conjunctiva/sclera: Conjunctivae normal.     Pupils: Pupils are equal, round, and reactive to light.  Neck:     Musculoskeletal: Normal range of motion  and neck supple.  Cardiovascular:     Rate and Rhythm: Normal rate and regular rhythm.     Pulses: Normal pulses.     Heart sounds: Normal heart sounds.  Pulmonary:     Effort: Pulmonary effort is normal.     Breath sounds: Normal breath sounds.  Abdominal:     General: Abdomen is flat. Bowel sounds are normal.     Palpations: Abdomen is soft.  Musculoskeletal:     Left wrist: She exhibits decreased range of motion, tenderness and deformity.  Skin:    General: Skin is warm.     Capillary Refill: Capillary refill takes less than 2 seconds.     Comments: Skin tear left elbow  Neurological:     General: No focal deficit present.     Mental Status: She is alert and oriented to person, place, and time.  Psychiatric:        Mood and Affect: Mood normal.        Behavior: Behavior normal.      ED Treatments / Results  Labs (all labs ordered are listed, but only abnormal results are displayed) Labs Reviewed - No data to display  EKG EKG Interpretation  Date/Time:  Thursday Apr 08 2019 18:02:42 EDT Ventricular Rate:  88 PR Interval:    QRS Duration: 87 QT Interval:  374 QTC Calculation: 453 R Axis:   69 Text Interpretation:  Sinus rhythm Nonspecific T abnormalities, lateral leads No significant change since last tracing Confirmed by Jacalyn Lefevre [phone]) on 04/08/2019 6:09:30 PM   Radiology Dg Chest 2 View  Result Date: 04/08/2019 CLINICAL DATA:  Chest pain fall EXAM: CHEST - 2 VIEW COMPARISON:  03/23/2019, 02/13/2019, 04/26/2016 FINDINGS: Linear scarring or atelectasis at the left base. No acute consolidation or effusion. Stable cardiomediastinal silhouette with tortuous aorta. Aortic atherosclerosis. No pneumothorax. IMPRESSION: No active cardiopulmonary disease. Mild scarring or atelectasis at the left base Electronically Signed   By: Jasmine PangKim  Fujinaga M.D.   On: 04/08/2019 19:52   Dg Pelvis 1-2 Views  Result Date: 04/08/2019 CLINICAL DATA:  Pain after fall EXAM: PELVIS -  1-2 VIEW COMPARISON:  CT 05/26/2017 FINDINGS: SI joints are non widened. Pubic symphysis and rami are intact. Both femoral heads project in joint. Deformity at the greater trochanter of the right femur, felt to correspond to previously noted fracture. IMPRESSION: 1. Chronic fracture deformity of the greater trochanter of the right femur 2. No definite acute osseous abnormality Electronically Signed   By: Jasmine PangKim  Fujinaga M.D.   On: 04/08/2019 19:54   Dg Wrist 2 Views Left  Result Date: 04/08/2019 CLINICAL DATA:  78 year old female post reduction film. EXAM: LEFT WRIST - 2 VIEW COMPARISON:  Left wrist series earlier today. FINDINGS: AP and lateral views of the left wrist with overlying splint/cast in place. Mildly comminuted transverse fracture along the proximal margin of the distal radius ORIF hardware demonstrates improved alignment with stable mild radial displacement but largely resolved ventral displacement and dorsal angulation. No new osseous abnormality identified. IMPRESSION: Improved alignment about the distal radius shaft fracture along the proximal margin of the ORIF. Electronically Signed   By: Odessa FlemingH  Hall M.D.   On: 04/08/2019 21:04   Dg Wrist Complete Left  Result Date: 04/08/2019 CLINICAL DATA:  Wrist pain after fall EXAM: LEFT WRIST - COMPLETE 3+ VIEW COMPARISON:  12/21/2009 FINDINGS: Bones appear osteopenic. Moderate arthritis at the first [facility] joint and STT interval. Surgical plate and screw fixation of prior fracture of the distal radius. Acute mildly comminuted fracture involving the distal shaft of the radius at the proximal end of the fixating plate and screws. Moderate dorsal angulation of distal fracture fragment and slight radial, less than 1/4 bone with displacement. IMPRESSION: Prior surgical plate and screw fixation for distal radius fracture. Acute, mildly comminuted displaced and angulated fracture involving distal shaft of the radius at the proximal aspect of the fixating plate and  screws. Electronically Signed   By: Jasmine PangKim  Fujinaga M.D.   On: 04/08/2019 19:52    Procedures .Splint Application Date/Time: 04/08/2019 9:19 PM Performed by: Jacalyn LefevreHaviland, Harjit Douds, MD Authorized by: Jacalyn LefevreHaviland, Hobert Poplaski, MD   Consent:    Consent obtained:  Verbal   Consent given by:  Patient   Risks discussed:  Discoloration, numbness and pain   Alternatives discussed:  No treatment Universal protocol:    Patient identity confirmed:  Verbally with patient Pre-procedure details:    Sensation:  Normal Procedure details:    Laterality:  Right   Location:  Wrist   Wrist:  L wrist   Splint type:  Sugar tong   Supplies:  Cotton padding, Ortho-Glass and sling Post-procedure details:    Pain:  Improved   Sensation:  Normal   Patient tolerance of procedure:  Tolerated well, no immediate complications .Sedation Date/Time: 04/08/2019 9:20 PM Performed by: Jacalyn LefevreHaviland, Langston Tuberville, MD Authorized by: Jacalyn LefevreHaviland, Cordarrell Sane, MD   Consent:    Consent obtained:  Verbal   Consent given by:  Patient   Risks discussed:  Allergic reaction, dysrhythmia, inadequate sedation, nausea, prolonged hypoxia resulting in organ damage, prolonged sedation necessitating reversal, respiratory compromise necessitating ventilatory assistance and intubation and vomiting   Alternatives discussed:  Analgesia without sedation,  anxiolysis and regional anesthesia Universal protocol:    Procedure explained and questions answered to patient or proxy's satisfaction: yes     Relevant documents present and verified: yes     Test results available and properly labeled: yes     Imaging studies available: yes     Required blood products, implants, devices, and special equipment available: yes     Site/side marked: yes     Immediately prior to procedure a time out was called: yes     Patient identity confirmation method:  Verbally with patient Indications:    Procedure necessitating sedation performed by:  Physician performing sedation Pre-sedation  assessment:    Time since last food or drink:  5   ASA classification: class 3 - patient with severe systemic disease     Neck mobility: normal     Mouth opening:  3 or more finger widths   Thyromental distance:  4 finger widths   Mallampati score:  I - soft palate, uvula, fauces, pillars visible   Pre-sedation assessments completed and reviewed: airway patency, cardiovascular function, hydration status, mental status, nausea/vomiting, pain level, respiratory function and temperature   Immediate pre-procedure details:    Reassessment: Patient reassessed immediately prior to procedure     Reviewed: vital signs, relevant labs/tests and NPO status     Verified: bag valve mask available, emergency equipment available, intubation equipment available, IV patency confirmed, oxygen available and suction available   Procedure details (see MAR for exact dosages):    Preoxygenation:  Nasal cannula   Sedation:  Propofol   Analgesia:  Morphine   Intra-procedure monitoring:  Blood pressure monitoring, cardiac monitor, continuous pulse oximetry, frequent LOC assessments, frequent vital sign checks and continuous capnometry   Intra-procedure events: none     Total Provider sedation time (minutes):  30 Post-procedure details:    Post-sedation assessment completed:  04/08/2019 9:20 PM   Attendance: Constant attendance by certified staff until patient recovered     Recovery: Patient returned to pre-procedure baseline     Post-sedation assessments completed and reviewed: airway patency, cardiovascular function, hydration status, mental status, nausea/vomiting, pain level, respiratory function and temperature     Patient is stable for discharge or admission: yes     Patient tolerance:  Tolerated well, no immediate complications Reduction of fracture Date/Time: 04/08/2019 9:20 PM Performed by: Jacalyn Lefevre, MD Authorized by: Jacalyn Lefevre, MD  Consent: Written consent obtained. Risks and benefits: risks,  benefits and alternatives were discussed Consent given by: patient Patient understanding: patient states understanding of the procedure being performed Patient identity confirmed: verbally with patient Time out: Immediately prior to procedure a "time out" was called to verify the correct patient, procedure, equipment, support staff and site/side marked as required. Preparation: Patient was prepped and draped in the usual sterile fashion. Local anesthesia used: no  Anesthesia: Local anesthesia used: no  Sedation: Patient sedated: yes Sedatives: propofol Analgesia: morphine Vitals: Vital signs were monitored during sedation.  Patient tolerance: Patient tolerated the procedure well with no immediate complications    (including critical care time)  Medications Ordered in ED Medications  morphine 4 MG/ML injection 4 mg (4 mg Intravenous Given 04/08/19 1826)  ondansetron (ZOFRAN) injection 4 mg (4 mg Intravenous Given 04/08/19 1825)  propofol (DIPRIVAN) 10 mg/mL bolus/IV push 24.5 mg (24.5 mg Intravenous Given 04/08/19 2030)     Initial Impression / Assessment and Plan / ED Course  I have reviewed the triage vital signs and the nursing notes.  Pertinent labs &  imaging results that were available during my care of the patient were reviewed by me and considered in my medical decision making (see chart for details).       Pt d/w Dr. Roney Mans who said he will see her in the office on Tuesday, May 26.  Pt stable for d/c.  Return if worse.  Final Clinical Impressions(s) / ED Diagnoses   Final diagnoses:  Closed displaced comminuted fracture of shaft of right radius, initial encounter  Fall, initial encounter  Ambulatory dysfunction    ED Discharge Orders         Ordered    traMADol (ULTRAM) 50 MG tablet  Every 6 hours PRN     04/08/19 2117           Jacalyn Lefevre, MD 04/08/19 2122

## 2019-04-08 NOTE — ED Notes (Signed)
Daughter would like update once x-ray is resulted.  Her number is [phone]

## 2019-04-08 NOTE — ED Notes (Addendum)
[name] from facility reports they will send someone to pick her up when discharged. Number [phone] cell: [phone]

## 2019-04-08 NOTE — ED Notes (Signed)
Discharge instructions discussed with Pt. Pt verbalized understanding. Pt stable and leaving via facility car.

## 2019-04-08 NOTE — ED Triage Notes (Signed)
Pt BIB [facility] from [facility] [address] Pt was getting up from her wheelchair when she became dizzy and fell onto her left wrist. Pt did not lose consciousness. Pt with visible deformity to left wrist and skin tear to left elbow. Per facility pt experiences frequent falls. Pt diagnosed with UTI this A.M.

## 2019-04-08 NOTE — ED Notes (Signed)
Daughter [name] updated by this RN, pt given phone to speak to daughter.

## 2019-04-08 NOTE — ED Notes (Signed)
Nurse Navigator spoke with pt daughter and gave her an update.  Called daughter back on facetime to allow her to see pt

## 2019-04-08 NOTE — ED Notes (Signed)
Called [facility], dispatching unit to come for patient.

## 2019-04-09 ENCOUNTER — Other Ambulatory Visit (HOSPITAL_COMMUNITY)
Admission: RE | Admit: 2019-04-09 | Discharge: 2019-04-09 | Disposition: A | Discharge status: Home / Self Care | Payer: Medicare Other | Source: Ambulatory Visit | Attending: Orthopedic Surgery | Admitting: Orthopedic Surgery

## 2019-04-09 ENCOUNTER — Encounter (HOSPITAL_COMMUNITY): Payer: Self-pay | Admitting: *Deleted

## 2019-04-09 ENCOUNTER — Other Ambulatory Visit: Payer: Self-pay

## 2019-04-09 DIAGNOSIS — Z1159 Encounter for screening for other viral diseases: Secondary | ICD-10-CM | POA: Diagnosis present

## 2019-04-09 NOTE — Progress Notes (Signed)
Faxed Pre-op instructions to [facility], Med [employer] and reviewed Covid 19 questions .SPOKE W/  _Med Forensic scientist, Gwen Shough     SCREENING SYMPTOMS OF COVID 19:   COUGH--No  RUNNY NOSE--- No  SORE THROAT---No  NASAL CONGESTION----No  SNEEZING----No  SHORTNESS OF BREATH---No  DIFFICULTY BREATHING---No  TEMP >100.0 -----No  UNEXPLAINED BODY ACHES------No  CHILLS -------- No  HEADACHES ---------No  LOSS OF SMELL/ TASTE -------No-    HAVE YOU OR ANY FAMILY MEMBER TRAVELLED PAST 14 DAYS OUT OF THE   COUNTY---No STATE----No COUNTRY----No  HAVE YOU OR ANY FAMILY MEMBER BEEN EXPOSED TO ANYONE WITH COVID 19? No

## 2019-04-09 NOTE — Progress Notes (Addendum)
Preop instructions for:  [name]                       Date of Birth -Dec 30, 1940                            Date of Procedure:Tuesday 04/13/2019        Doctor:Dr Margarita Rana  Time to arrive at [facility]:0715 am  Report to: Admitting   Procedure:ORIF Left Radial Shaft Fracture with hardware removal  Any procedure time changes, MD office will notify you!   Do not eat or drink past midnight the night before your procedure.(To include any tube feedings-must be discontinued)    Take these morning medications only with sips of water.(or give through gastrostomy or feeding tube). Diltiazem (Cardiazem), Famotidine (Pepcid),Levothyroxine (Synthroid), Memantine (Namenda), use Albuterol inhaler if needed and use Symbicort inhaler if needed and bring inhalers with you to the hospital    Facility contact: [facility], Med [employer] Supervisor                  Phone:  [phone]                  Health Care POA:[name] Rice-daughter [phone]  Transportation contact phone#:[facility] [name]  [phone]  Please send day of procedure:current med list and meds last taken that day, confirm nothing by mouth status from what time, Patient Demographic info( to include DNR status, problem list, allergies)   RN contact name/phone#: [facility], [facility]    [phone]                             and Fax #:[phone]  [employer] card and picture ID Leave all jewelry and other valuables at place where living( no metal or rings to be worn) No contact lens Women-no make-up, no lotions,perfumes,powders Men-no colognes,lotions  Any questions day of procedure,call Endoscopy [phone]!   Sent from :[facility]                   Phone:[phone] or  [phone]                   Fax:[phone]  Sent by :Telford Nab. Cathlean Sauer

## 2019-04-10 LAB — NOVEL CORONAVIRUS, NAA (HOSP ORDER, SEND-OUT TO REF LAB; TAT 18-24 HRS): SARS-CoV-2, NAA: NOT DETECTED

## 2019-04-12 NOTE — Anesthesia Preprocedure Evaluation (Addendum)
Anesthesia Evaluation  Patient identified by MRN, date of birth, ID band Patient awake    Reviewed: Allergy & Precautions, H&P , NPO status , Patient's Chart, lab work & pertinent test results  Airway Mallampati: II  TM Distance: >3 FB Neck ROM: Full    Dental no notable dental hx. (+) Edentulous Upper, Poor Dentition, Upper Dentures, Partial Lower, Missing, Chipped   Pulmonary neg pulmonary ROS, sleep apnea ,    Pulmonary exam normal breath sounds clear to auscultation       Cardiovascular Exercise Tolerance: Good hypertension, Pt. on medications negative cardio ROS Normal cardiovascular exam+ dysrhythmias Atrial Fibrillation  Rhythm:Regular Rate:Normal  Echocardiogram abnormal 02/26/11 MVP,mild MR,AOV mildly sclerotic. EF >55%    Neuro/Psych PSYCHIATRIC DISORDERS Anxiety Depression Dementia negative neurological ROS  negative psych ROS   GI/Hepatic negative GI ROS, Neg liver ROS, GERD  Medicated,  Endo/Other  negative endocrine ROSHypothyroidism   Renal/GU CRF and ARFRenal diseasenegative Renal ROS  negative genitourinary   Musculoskeletal negative musculoskeletal ROS (+) Arthritis , Osteoarthritis,    Abdominal   Peds negative pediatric ROS (+)  Hematology negative hematology ROS (+)   Anesthesia Other Findings   Reproductive/Obstetrics negative OB ROS                           Anesthesia Physical Anesthesia Plan  ASA: III  Anesthesia Plan: General   Post-op Pain Management: GA combined w/ Regional for post-op pain   Induction: Intravenous  PONV Risk Score and Plan: 3 and Ondansetron, Treatment may vary due to age or medical condition, Midazolam and Dexamethasone  Airway Management Planned: LMA  Additional Equipment:   Intra-op Plan:   Post-operative Plan: Extubation in OR  Informed Consent: I have reviewed the patients History and Physical, chart, labs and discussed the  procedure including the risks, benefits and alternatives for the proposed anesthesia with the patient or authorized representative who has indicated his/her understanding and acceptance.       Plan Discussed with: CRNA, Anesthesiologist and Surgeon  Anesthesia Plan Comments: (  )        Anesthesia Quick Evaluation

## 2019-04-12 NOTE — Progress Notes (Signed)
SPOKE W/  Staff Member at [employer] of      SCREENING SYMPTOMS OF COVID 19:   COUGH--no  RUNNY NOSE--- no  SORE THROAT---no  NASAL CONGESTION----no  SNEEZING----no  SHORTNESS OF BREATH---no  DIFFICULTY BREATHING---no  TEMP >100.0 -----no  UNEXPLAINED BODY ACHES------no  CHILLS -------- no  HEADACHES ---------no  LOSS OF SMELL/ TASTE --------no    HAVE YOU OR ANY FAMILY MEMBER TRAVELLED PAST 14 DAYS OUT OF THE   COUNTY---no STATE----no COUNTRY--no--  HAVE YOU OR ANY FAMILY MEMBER BEEN EXPOSED TO ANYONE WITH COVID 19? no

## 2019-04-13 ENCOUNTER — Encounter (HOSPITAL_COMMUNITY): Payer: Self-pay | Admitting: General Practice

## 2019-04-13 ENCOUNTER — Observation Stay (HOSPITAL_COMMUNITY)
Admission: RE | Admit: 2019-04-13 | Discharge: 2019-04-14 | Disposition: A | Discharge status: Home / Self Care | Payer: Medicare Other | Attending: Orthopedic Surgery | Admitting: Orthopedic Surgery

## 2019-04-13 ENCOUNTER — Encounter (HOSPITAL_COMMUNITY)
Admission: RE | Disposition: A | Discharge status: Home / Self Care | Payer: Self-pay | Source: Home / Self Care | Attending: Orthopedic Surgery

## 2019-04-13 ENCOUNTER — Ambulatory Visit (HOSPITAL_COMMUNITY): Payer: Medicare Other | Admitting: Anesthesiology

## 2019-04-13 ENCOUNTER — Other Ambulatory Visit: Payer: Self-pay

## 2019-04-13 DIAGNOSIS — Z79899 Other long term (current) drug therapy: Secondary | ICD-10-CM | POA: Insufficient documentation

## 2019-04-13 DIAGNOSIS — R2681 Unsteadiness on feet: Secondary | ICD-10-CM | POA: Insufficient documentation

## 2019-04-13 DIAGNOSIS — M1990 Unspecified osteoarthritis, unspecified site: Secondary | ICD-10-CM | POA: Diagnosis not present

## 2019-04-13 DIAGNOSIS — Z7951 Long term (current) use of inhaled steroids: Secondary | ICD-10-CM | POA: Insufficient documentation

## 2019-04-13 DIAGNOSIS — K219 Gastro-esophageal reflux disease without esophagitis: Secondary | ICD-10-CM | POA: Insufficient documentation

## 2019-04-13 DIAGNOSIS — M810 Age-related osteoporosis without current pathological fracture: Secondary | ICD-10-CM | POA: Diagnosis not present

## 2019-04-13 DIAGNOSIS — N183 Chronic kidney disease, stage 3 (moderate): Secondary | ICD-10-CM | POA: Insufficient documentation

## 2019-04-13 DIAGNOSIS — Z79890 Hormone replacement therapy (postmenopausal): Secondary | ICD-10-CM | POA: Insufficient documentation

## 2019-04-13 DIAGNOSIS — F0390 Unspecified dementia without behavioral disturbance: Secondary | ICD-10-CM | POA: Insufficient documentation

## 2019-04-13 DIAGNOSIS — G4730 Sleep apnea, unspecified: Secondary | ICD-10-CM | POA: Insufficient documentation

## 2019-04-13 DIAGNOSIS — Z96652 Presence of left artificial knee joint: Secondary | ICD-10-CM | POA: Insufficient documentation

## 2019-04-13 DIAGNOSIS — I4891 Unspecified atrial fibrillation: Secondary | ICD-10-CM | POA: Diagnosis not present

## 2019-04-13 DIAGNOSIS — J45909 Unspecified asthma, uncomplicated: Secondary | ICD-10-CM | POA: Diagnosis not present

## 2019-04-13 DIAGNOSIS — M96632 Fracture of radius or ulna following insertion of orthopedic implant, joint prosthesis, or bone plate, left arm: Secondary | ICD-10-CM | POA: Diagnosis not present

## 2019-04-13 DIAGNOSIS — I129 Hypertensive chronic kidney disease with stage 1 through stage 4 chronic kidney disease, or unspecified chronic kidney disease: Secondary | ICD-10-CM | POA: Diagnosis not present

## 2019-04-13 DIAGNOSIS — Y838 Other surgical procedures as the cause of abnormal reaction of the patient, or of later complication, without mention of misadventure at the time of the procedure: Secondary | ICD-10-CM | POA: Diagnosis not present

## 2019-04-13 DIAGNOSIS — F329 Major depressive disorder, single episode, unspecified: Secondary | ICD-10-CM | POA: Insufficient documentation

## 2019-04-13 DIAGNOSIS — E039 Hypothyroidism, unspecified: Secondary | ICD-10-CM | POA: Diagnosis not present

## 2019-04-13 DIAGNOSIS — F419 Anxiety disorder, unspecified: Secondary | ICD-10-CM | POA: Diagnosis not present

## 2019-04-13 DIAGNOSIS — Z885 Allergy status to narcotic agent status: Secondary | ICD-10-CM | POA: Insufficient documentation

## 2019-04-13 DIAGNOSIS — S52322A Displaced transverse fracture of shaft of left radius, initial encounter for closed fracture: Secondary | ICD-10-CM | POA: Diagnosis present

## 2019-04-13 HISTORY — PX: OPEN REDUCTION INTERNAL FIXATION (ORIF) DISTAL RADIAL FRACTURE: SHX5989

## 2019-04-13 LAB — CBC
HCT: 32.5 % — ABNORMAL LOW (ref 36.0–46.0)
Hemoglobin: 10.6 g/dL — ABNORMAL LOW (ref 12.0–15.0)
MCH: 31.7 pg (ref 26.0–34.0)
MCHC: 32.6 g/dL (ref 30.0–36.0)
MCV: 97.3 fL (ref 80.0–100.0)
Platelets: 179 10*3/uL (ref 150–400)
RBC: 3.34 MIL/uL — ABNORMAL LOW (ref 3.87–5.11)
RDW: 13.2 % (ref 11.5–15.5)
WBC: 6.7 10*3/uL (ref 4.0–10.5)
nRBC: 0 % (ref 0.0–0.2)

## 2019-04-13 LAB — BASIC METABOLIC PANEL
Anion gap: 13 (ref 5–15)
BUN: 24 mg/dL — ABNORMAL HIGH (ref 8–23)
CO2: 23 mmol/L (ref 22–32)
Calcium: 9.8 mg/dL (ref 8.9–10.3)
Chloride: 103 mmol/L (ref 98–111)
Creatinine, Ser: 1.29 mg/dL — ABNORMAL HIGH (ref 0.44–1.00)
GFR calc Af Amer: 47 mL/min — ABNORMAL LOW (ref >60)
GFR calc non Af Amer: 40 mL/min — ABNORMAL LOW (ref >60)
Glucose, Bld: 65 mg/dL — ABNORMAL LOW (ref 70–99)
Potassium: 3.6 mmol/L (ref 3.5–5.1)
Sodium: 139 mmol/L (ref 135–145)

## 2019-04-13 SURGERY — OPEN REDUCTION INTERNAL FIXATION (ORIF) DISTAL RADIUS FRACTURE
Anesthesia: General | Site: Arm Lower | Laterality: Left

## 2019-04-13 MED ORDER — ONDANSETRON HCL 4 MG/2ML IJ SOLN
INTRAMUSCULAR | Status: AC
  Filled 2019-04-13: qty 2

## 2019-04-13 MED ORDER — FAMOTIDINE 20 MG PO TABS
20.0000 mg | ORAL_TABLET | Freq: Every day | ORAL | Status: DC
  Administered 2019-04-14: 09:00:00 20 mg via ORAL
  Filled 2019-04-13: qty 1

## 2019-04-13 MED ORDER — LIDOCAINE 2% (20 MG/ML) 5 ML SYRINGE
INTRAMUSCULAR | Status: DC | PRN
  Administered 2019-04-13: 80 mg via INTRAVENOUS

## 2019-04-13 MED ORDER — FENTANYL CITRATE (PF) 100 MCG/2ML IJ SOLN
50.0000 ug | INTRAMUSCULAR | Status: DC
  Administered 2019-04-13: 50 ug via INTRAVENOUS

## 2019-04-13 MED ORDER — DOCUSATE SODIUM 100 MG PO CAPS
100.0000 mg | ORAL_CAPSULE | Freq: Two times a day (BID) | ORAL | Status: DC
  Administered 2019-04-13 – 2019-04-14 (×2): 100 mg via ORAL
  Filled 2019-04-13 (×2): qty 1

## 2019-04-13 MED ORDER — SODIUM CHLORIDE 0.9 % IV SOLN
INTRAVENOUS | Status: DC | PRN
  Administered 2019-04-13: 09:00:00 50 ug/min via INTRAVENOUS

## 2019-04-13 MED ORDER — ACETAMINOPHEN 500 MG PO TABS
1000.0000 mg | ORAL_TABLET | Freq: Three times a day (TID) | ORAL | Status: DC
  Administered 2019-04-13 – 2019-04-14 (×3): 1000 mg via ORAL
  Filled 2019-04-13 (×3): qty 2

## 2019-04-13 MED ORDER — CALCITRIOL 0.25 MCG PO CAPS
0.2500 ug | ORAL_CAPSULE | ORAL | Status: DC
  Administered 2019-04-14: 09:00:00 0.25 ug via ORAL
  Filled 2019-04-13: qty 1

## 2019-04-13 MED ORDER — FENTANYL CITRATE (PF) 100 MCG/2ML IJ SOLN: 25.0000 ug | INTRAMUSCULAR | Status: DC | PRN

## 2019-04-13 MED ORDER — OXYCODONE HCL 5 MG/5ML PO SOLN: 5.0000 mg | Freq: Once | ORAL | Status: DC | PRN

## 2019-04-13 MED ORDER — DEXAMETHASONE SODIUM PHOSPHATE 10 MG/ML IJ SOLN
INTRAMUSCULAR | Status: AC
  Filled 2019-04-13: qty 1

## 2019-04-13 MED ORDER — LACTATED RINGERS IV SOLN
INTRAVENOUS | Status: DC
  Administered 2019-04-13: 23:00:00 via INTRAVENOUS

## 2019-04-13 MED ORDER — ACETAMINOPHEN 500 MG PO TABS
1000.0000 mg | ORAL_TABLET | Freq: Once | ORAL | Status: AC
  Administered 2019-04-13: 08:00:00 1000 mg via ORAL
  Filled 2019-04-13: qty 2

## 2019-04-13 MED ORDER — ACETAMINOPHEN 325 MG PO TABS
325.0000 mg | ORAL_TABLET | Freq: Four times a day (QID) | ORAL | Status: DC | PRN
  Administered 2019-04-14: 11:00:00 325 mg via ORAL
  Filled 2019-04-13: qty 2

## 2019-04-13 MED ORDER — PHENYLEPHRINE HCL (PRESSORS) 10 MG/ML IV SOLN
INTRAVENOUS | Status: AC
  Filled 2019-04-13: qty 1

## 2019-04-13 MED ORDER — ACETAMINOPHEN 325 MG PO TABS: 325.0000 mg | ORAL_TABLET | ORAL | Status: DC | PRN

## 2019-04-13 MED ORDER — ALBUTEROL SULFATE (2.5 MG/3ML) 0.083% IN NEBU: 2.5000 mg | INHALATION_SOLUTION | RESPIRATORY_TRACT | Status: DC

## 2019-04-13 MED ORDER — DILTIAZEM HCL 30 MG PO TABS
30.0000 mg | ORAL_TABLET | Freq: Every evening | ORAL | Status: DC | PRN
  Filled 2019-04-13: qty 1

## 2019-04-13 MED ORDER — LACTATED RINGERS IV SOLN
INTRAVENOUS | Status: DC
  Administered 2019-04-13: 12:00:00 1000 mL via INTRAVENOUS
  Administered 2019-04-13: 08:00:00 via INTRAVENOUS

## 2019-04-13 MED ORDER — CLOPIDOGREL BISULFATE 75 MG PO TABS
75.0000 mg | ORAL_TABLET | Freq: Every day | ORAL | Status: DC
  Administered 2019-04-14: 75 mg via ORAL
  Filled 2019-04-13: qty 1

## 2019-04-13 MED ORDER — HYDROMORPHONE HCL 1 MG/ML IJ SOLN
0.2500 mg | INTRAMUSCULAR | Status: DC | PRN
  Administered 2019-04-14: 0.25 mg via INTRAVENOUS
  Filled 2019-04-13: qty 0.5

## 2019-04-13 MED ORDER — ONDANSETRON HCL 4 MG/2ML IJ SOLN: 4.0000 mg | Freq: Four times a day (QID) | INTRAMUSCULAR | Status: DC | PRN

## 2019-04-13 MED ORDER — MEMANTINE HCL 10 MG PO TABS
10.0000 mg | ORAL_TABLET | Freq: Two times a day (BID) | ORAL | Status: DC
  Administered 2019-04-13 – 2019-04-14 (×2): 10 mg via ORAL
  Filled 2019-04-13 (×2): qty 1

## 2019-04-13 MED ORDER — DEXAMETHASONE SODIUM PHOSPHATE 10 MG/ML IJ SOLN
INTRAMUSCULAR | Status: DC | PRN
  Administered 2019-04-13: 5 mg via INTRAVENOUS

## 2019-04-13 MED ORDER — FLUTICASONE FUROATE-VILANTEROL 200-25 MCG/INH IN AEPB
1.0000 | INHALATION_SPRAY | Freq: Every day | RESPIRATORY_TRACT | Status: DC
  Administered 2019-04-14: 1 via RESPIRATORY_TRACT
  Filled 2019-04-13: qty 28

## 2019-04-13 MED ORDER — ACETAMINOPHEN 160 MG/5ML PO SOLN: 325.0000 mg | ORAL | Status: DC | PRN

## 2019-04-13 MED ORDER — BUDESONIDE 0.5 MG/2ML IN SUSP
0.5000 mg | Freq: Two times a day (BID) | RESPIRATORY_TRACT | Status: DC
  Administered 2019-04-13 – 2019-04-14 (×2): 0.5 mg via RESPIRATORY_TRACT
  Filled 2019-04-13 (×2): qty 2

## 2019-04-13 MED ORDER — CHLORHEXIDINE GLUCONATE 4 % EX LIQD: 60.0000 mL | Freq: Once | CUTANEOUS | Status: DC

## 2019-04-13 MED ORDER — PROPOFOL 10 MG/ML IV BOLUS
INTRAVENOUS | Status: DC | PRN
  Administered 2019-04-13: 100 mg via INTRAVENOUS

## 2019-04-13 MED ORDER — PHENYLEPHRINE 40 MCG/ML (10ML) SYRINGE FOR IV PUSH (FOR BLOOD PRESSURE SUPPORT)
PREFILLED_SYRINGE | INTRAVENOUS | Status: DC | PRN
  Administered 2019-04-13 (×5): 80 ug via INTRAVENOUS

## 2019-04-13 MED ORDER — BUPIVACAINE-EPINEPHRINE (PF) 0.5% -1:200000 IJ SOLN
INTRAMUSCULAR | Status: DC | PRN
  Administered 2019-04-13: 15 mL via PERINEURAL

## 2019-04-13 MED ORDER — METOCLOPRAMIDE HCL 5 MG/ML IJ SOLN: 5.0000 mg | Freq: Three times a day (TID) | INTRAMUSCULAR | Status: DC | PRN

## 2019-04-13 MED ORDER — ALBUTEROL SULFATE (2.5 MG/3ML) 0.083% IN NEBU: 2.5000 mg | INHALATION_SOLUTION | RESPIRATORY_TRACT | Status: DC | PRN

## 2019-04-13 MED ORDER — POLYETHYLENE GLYCOL 3350 17 G PO PACK: 17.0000 g | PACK | Freq: Every day | ORAL | Status: DC | PRN

## 2019-04-13 MED ORDER — PROPOFOL 10 MG/ML IV BOLUS
INTRAVENOUS | Status: AC
  Filled 2019-04-13: qty 20

## 2019-04-13 MED ORDER — LIDOCAINE 2% (20 MG/ML) 5 ML SYRINGE
INTRAMUSCULAR | Status: AC
  Filled 2019-04-13: qty 5

## 2019-04-13 MED ORDER — ONDANSETRON HCL 4 MG/2ML IJ SOLN: 4.0000 mg | Freq: Once | INTRAMUSCULAR | Status: DC | PRN

## 2019-04-13 MED ORDER — CEFAZOLIN SODIUM-DEXTROSE 1-4 GM/50ML-% IV SOLN
1.0000 g | Freq: Two times a day (BID) | INTRAVENOUS | Status: AC
  Administered 2019-04-13 – 2019-04-14 (×2): 1 g via INTRAVENOUS
  Filled 2019-04-13 (×2): qty 50

## 2019-04-13 MED ORDER — MEPERIDINE HCL 50 MG/ML IJ SOLN: 6.2500 mg | INTRAMUSCULAR | Status: DC | PRN

## 2019-04-13 MED ORDER — CLONAZEPAM 1 MG PO TABS
2.0000 mg | ORAL_TABLET | Freq: Every day | ORAL | Status: DC
  Administered 2019-04-13: 2 mg via ORAL
  Filled 2019-04-13: qty 2

## 2019-04-13 MED ORDER — ONDANSETRON HCL 4 MG PO TABS: 4.0000 mg | ORAL_TABLET | Freq: Four times a day (QID) | ORAL | Status: DC | PRN

## 2019-04-13 MED ORDER — TRAMADOL HCL 50 MG PO TABS: 50.0000 mg | ORAL_TABLET | Freq: Four times a day (QID) | ORAL | 0 refills | Status: DC | PRN

## 2019-04-13 MED ORDER — PHENYLEPHRINE 40 MCG/ML (10ML) SYRINGE FOR IV PUSH (FOR BLOOD PRESSURE SUPPORT)
PREFILLED_SYRINGE | INTRAVENOUS | Status: AC
  Filled 2019-04-13: qty 10

## 2019-04-13 MED ORDER — LEVOTHYROXINE SODIUM 112 MCG PO TABS
112.0000 ug | ORAL_TABLET | Freq: Every day | ORAL | Status: DC
  Administered 2019-04-14: 06:00:00 112 ug via ORAL
  Filled 2019-04-13: qty 1

## 2019-04-13 MED ORDER — ONDANSETRON HCL 4 MG/2ML IJ SOLN
INTRAMUSCULAR | Status: DC | PRN
  Administered 2019-04-13: 4 mg via INTRAVENOUS

## 2019-04-13 MED ORDER — CEFAZOLIN SODIUM-DEXTROSE 2-4 GM/100ML-% IV SOLN
2.0000 g | INTRAVENOUS | Status: AC
  Administered 2019-04-13: 2 g via INTRAVENOUS
  Filled 2019-04-13: qty 100

## 2019-04-13 MED ORDER — FENTANYL CITRATE (PF) 100 MCG/2ML IJ SOLN
INTRAMUSCULAR | Status: AC
  Administered 2019-04-13: 50 ug
  Filled 2019-04-13: qty 2

## 2019-04-13 MED ORDER — SERTRALINE HCL 50 MG PO TABS
150.0000 mg | ORAL_TABLET | Freq: Every day | ORAL | Status: DC
  Administered 2019-04-13: 150 mg via ORAL
  Filled 2019-04-13: qty 1

## 2019-04-13 MED ORDER — OXYCODONE HCL 5 MG PO TABS: 5.0000 mg | ORAL_TABLET | Freq: Once | ORAL | Status: DC | PRN

## 2019-04-13 MED ORDER — LACTATED RINGERS IV SOLN: INTRAVENOUS | Status: DC

## 2019-04-13 MED ORDER — METOCLOPRAMIDE HCL 5 MG PO TABS: 5.0000 mg | ORAL_TABLET | Freq: Three times a day (TID) | ORAL | Status: DC | PRN

## 2019-04-13 MED ORDER — FAMOTIDINE 20 MG PO TABS: 40.0000 mg | ORAL_TABLET | Freq: Every day | ORAL | Status: DC

## 2019-04-13 MED ORDER — BUPIVACAINE LIPOSOME 1.3 % IJ SUSP
INTRAMUSCULAR | Status: DC | PRN
  Administered 2019-04-13: 10 mL via PERINEURAL

## 2019-04-13 MED ORDER — OXYCODONE HCL 5 MG PO TABS
5.0000 mg | ORAL_TABLET | ORAL | Status: DC | PRN
  Administered 2019-04-14 (×2): 5 mg via ORAL
  Filled 2019-04-13 (×2): qty 1

## 2019-04-13 MED ORDER — DIPHENHYDRAMINE HCL 25 MG PO CAPS: 25.0000 mg | ORAL_CAPSULE | ORAL | Status: DC | PRN

## 2019-04-13 MED ORDER — DILTIAZEM HCL ER COATED BEADS 120 MG PO CP24
120.0000 mg | ORAL_CAPSULE | Freq: Every day | ORAL | Status: DC
  Administered 2019-04-14: 120 mg via ORAL
  Filled 2019-04-13: qty 1

## 2019-04-13 SURGICAL SUPPLY — 50 items
BAG ZIPLOCK 12X15 (MISCELLANEOUS) ×3 IMPLANT
BANDAGE ACE 3X5.8 VEL STRL LF (GAUZE/BANDAGES/DRESSINGS) ×2 IMPLANT
BANDAGE ACE 4X5 VEL STRL LF (GAUZE/BANDAGES/DRESSINGS) ×2 IMPLANT
BIT DRILL 2.5X4 QC (BIT) ×2 IMPLANT
CLOSURE WOUND 1/2 X4 (GAUZE/BANDAGES/DRESSINGS) ×1
COVER SURGICAL LIGHT HANDLE (MISCELLANEOUS) ×3 IMPLANT
COVER WAND RF STERILE (DRAPES) IMPLANT
CUFF TOURN SGL QUICK 18X4 (TOURNIQUET CUFF) ×3 IMPLANT
DRAPE C-ARM 42X120 X-RAY (DRAPES) ×3 IMPLANT
DRAPE U-SHAPE 47X51 STRL (DRAPES) ×3 IMPLANT
DRSG ADAPTIC 3X8 NADH LF (GAUZE/BANDAGES/DRESSINGS) ×3 IMPLANT
DRSG EMULSION OIL 3X3 NADH (GAUZE/BANDAGES/DRESSINGS) ×2 IMPLANT
DRSG PAD ABDOMINAL 8X10 ST (GAUZE/BANDAGES/DRESSINGS) ×3 IMPLANT
DURAPREP 26ML APPLICATOR (WOUND CARE) ×3 IMPLANT
ELECT REM PT RETURN 15FT ADLT (MISCELLANEOUS) ×3 IMPLANT
GAUZE SPONGE 4X4 12PLY STRL (GAUZE/BANDAGES/DRESSINGS) ×3 IMPLANT
GLOVE BIO SURGEON STRL SZ7.5 (GLOVE) ×3 IMPLANT
GLOVE BIOGEL PI IND STRL 8 (GLOVE) ×1 IMPLANT
GLOVE BIOGEL PI INDICATOR 8 (GLOVE) ×2
GLOVE ECLIPSE 8.0 STRL XLNG CF (GLOVE) ×3 IMPLANT
GOWN STRL REUS W/TWL XL LVL3 (GOWN DISPOSABLE) ×3 IMPLANT
KIT BASIN OR (CUSTOM PROCEDURE TRAY) ×3 IMPLANT
KIT TURNOVER KIT A (KITS) ×2 IMPLANT
NS IRRIG 1000ML POUR BTL (IV SOLUTION) ×3 IMPLANT
PACK ORTHO EXTREMITY (CUSTOM PROCEDURE TRAY) ×3 IMPLANT
PAD CAST 3X4 CTTN HI CHSV (CAST SUPPLIES) ×1 IMPLANT
PAD CAST 4YDX4 CTTN HI CHSV (CAST SUPPLIES) ×1 IMPLANT
PADDING CAST COTTON 3X4 STRL (CAST SUPPLIES) ×2
PADDING CAST COTTON 4X4 STRL (CAST SUPPLIES) ×2
PEG SUBCHONDRAL SMOOTH 2.0X14 (Peg) ×2 IMPLANT
PEG SUBCHONDRAL SMOOTH 2.0X16 (Peg) ×4 IMPLANT
PEG SUBCHONDRAL SMOOTH 2.0X18 (Peg) ×8 IMPLANT
PLATE XLONG 24.4X89.5 LT (Plate) ×2 IMPLANT
PROTECTOR NERVE ULNAR (MISCELLANEOUS) ×3 IMPLANT
SCREW BN 12X3.5XNS CORT TI (Screw) IMPLANT
SCREW CORT 3.5X12 (Screw) ×14 IMPLANT
SLING ARM FOAM STRAP MED (SOFTGOODS) ×2 IMPLANT
SPLINT PLASTER CAST XFAST 5X30 (CAST SUPPLIES) IMPLANT
SPLINT PLASTER XFAST SET 5X30 (CAST SUPPLIES) ×2
STRIP CLOSURE SKIN 1/2X4 (GAUZE/BANDAGES/DRESSINGS) ×2 IMPLANT
SUT ETHILON 4 0 (SUTURE) ×4 IMPLANT
SUT MNCRL AB 4-0 PS2 18 (SUTURE) ×3 IMPLANT
SUT VIC AB 0 CT1 27 (SUTURE) ×4
SUT VIC AB 0 CT1 27XBRD ANTBC (SUTURE) ×2 IMPLANT
SUT VIC AB 2-0 CT1 27 (SUTURE) ×2
SUT VIC AB 2-0 CT1 TAPERPNT 27 (SUTURE) ×1 IMPLANT
TOWEL OR 17X26 10 PK STRL BLUE (TOWEL DISPOSABLE) ×4 IMPLANT
TOWEL OR NON WOVEN STRL DISP B (DISPOSABLE) ×3 IMPLANT
UNDERPAD 30X30 (UNDERPADS AND DIAPERS) ×3 IMPLANT
WATER STERILE IRR 1000ML POUR (IV SOLUTION) ×3 IMPLANT

## 2019-04-13 NOTE — H&P (Signed)
ORTHOPAEDIC CONSULTATION  REQUESTING PHYSICIAN: Renette Butters, MD  Chief Complaint: left radius fracture  HPI: [name] is a 78 y.o. female who complains of a fall onto the LUE. Pain at the L wrist  Past Medical History:  Diagnosis Date   Abnormal heart rhythm    Abnormality of gait 12/21/2015   Afib ([place])    h/o   Anxiety    Arthritis    "hips, shoulders" (01/05/2015)   Back pain    Chronic kidney disease (CKD), stage III (moderate) (HCC)    Archie Endo 01/05/2015   Dementia ([place])    Depression    Echocardiogram abnormal 02/26/11   MVP,mild MR,AOV mildly sclerotic. EF >55%   GERD (gastroesophageal reflux disease)    Hypertension    Hypothyroidism    Memory difficulty 06/05/2016   Nausea & vomiting 11/2016   Osteoporosis    Osteoporosis    Reactive airway disease    S/P right hip fracture 06/2017   No surgery required   Scoliosis    Sleep apnea    "suppose to wear a mask; can't tolerate it" (01/05/2015)   Past Surgical History:  Procedure Laterality Date   CATARACT EXTRACTION, BILATERAL Bilateral    [place]   DILATION AND CURETTAGE OF UTERUS     FLEXIBLE SIGMOIDOSCOPY N/A 11/24/2016   Procedure: FLEXIBLE SIGMOIDOSCOPY;  Surgeon: Gatha Mayer, MD;  Location: [facility] ENDOSCOPY;  Service: Endoscopy;  Laterality: N/A;   JOINT REPLACEMENT     OPEN REDUCTION INTERNAL FIXATION (ORIF) DISTAL RADIAL FRACTURE Left 12/2009   Archie Endo 12/28/2009   TOTAL KNEE ARTHROPLASTY Left 03/2010   Archie Endo 04/07/2010   TUBAL LIGATION     VAGINAL HYSTERECTOMY  1980   Social History   Socioeconomic History   Marital status: Divorced    Spouse name: Not on file   Number of children: 2   Years of education: 12   Highest education level: Not on file  Occupational History   Occupation: retired  Scientist, 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 insecurity:    Worry: Not on file    Inability: Not on  Lexicographer needs:    Medical: Not on file    Non-medical: Not on file  Tobacco Use   Smoking status: Never Smoker   Smokeless tobacco: Never Used  Substance and Sexual Activity   Alcohol use: No   Drug use: No   Sexual activity: Never  Lifestyle   Physical activity:    Days per week: Not on file    Minutes per session: Not on file   Stress: Not on file  Relationships   Social connections:    Talks on phone: Not on file    Gets together: Not on file    Attends religious service: Not on file    Active member of club or organization: Not on file    Attends meetings of clubs or organizations: Not on file    Relationship status: Not on file  Other Topics Concern   Not on file  Social History Narrative   Patient drinks caffeine twice a month.   Patient is right handed.    Family History  Problem Relation Age of Onset   Cancer Mother    Heart disease Father    Cancer Father    Allergies  Allergen Reactions   Vicodin [Hydrocodone-Acetaminophen] Nausea And Vomiting   Prior to  Admission medications   Medication Sig Start Date End Date Taking? Authorizing Provider  acetaminophen (TYLENOL) 325 MG tablet Take 650 mg by mouth 2 (two) times a day.     [provider]  acetaminophen (TYLENOL) 500 MG tablet Take 1,000 mg by mouth every 6 (six) hours as needed for headache (or discomfort- notify MD if either lasts >24 hours).    [provider]  albuterol (PROVENTIL) (2.5 MG/3ML) 0.083% nebulizer solution Take 2.5 mg by nebulization See admin instructions. Nebulize 2.5 mg every 4-6 hours as needed for wheezing or shortness of breath    [provider]  Amino Acids-Protein Hydrolys (FEEDING SUPPLEMENT, PRO-STAT SUGAR FREE 64,) LIQD Take 30 mLs by mouth daily. CITRUS FLAVOR    [provider]  anti-nausea (EMETROL) solution Take 30 mLs by mouth every 15 (fifteen) minutes as needed for nausea or vomiting (up to 4 doses in 1 hour  and notify MD if no relief).     [provider]  arformoterol (BROVANA) 15 MCG/2ML NEBU Take 2 mLs (15 mcg total) by nebulization 2 (two) times daily. 03/18/18   Deneise Lever, MD  baclofen (LIORESAL) 10 MG tablet Take 10 mg by mouth at bedtime as needed for muscle spasms.     [provider]  budesonide (PULMICORT) 0.5 MG/2ML nebulizer solution Take 2 mLs (0.5 mg total) by nebulization 2 (two) times daily. 03/18/18   Baird Lyons D, MD  budesonide-formoterol (SYMBICORT) 160-4.5 MCG/ACT inhaler Inhale 2 puffs into the lungs 2 (two) times daily. Rinse mouth after use - discard 3 months after opening    [provider]  calcitRIOL (ROCALTROL) 0.25 MCG capsule Take 0.25 mcg by mouth every Monday, Wednesday, and Friday. Do not crush    [provider]  Calcium Carb-Cholecalciferol (OYSTER SHELL CALCIUM 500+D PO) Take 1 tablet by mouth 2 (two) times a day.    [provider]  clonazePAM (KLONOPIN) 2 MG tablet Take 1 tablet (2 mg total) by mouth at bedtime. 04/30/18   Baird Lyons D, MD  clopidogrel (PLAVIX) 75 MG tablet Take 1 tablet (75 mg total) by mouth daily. 06/05/16   Kathrynn Ducking, MD  diltiazem (CARDIZEM CD) 120 MG 24 hr capsule Take 120 mg by mouth daily. 05/21/17   [provider]  diltiazem (CARDIZEM) 30 MG tablet Take 30 mg by mouth at bedtime as needed (for a pulse rate greater than 110).     [provider]  diphenhydrAMINE (BANOPHEN) 25 mg capsule Take 25 mg by mouth every 4 (four) hours as needed for itching.    [provider]  diphenoxylate-atropine (LOMOTIL) 2.5-0.025 MG tablet Take 1 tablet by mouth every 8 (eight) hours as needed for diarrhea or loose stools.    [provider]  famotidine (PEPCID) 40 MG tablet Take 40 mg by mouth daily.    [provider]  guaifenesin (ROBITUSSIN) 100 MG/5ML syrup Take 200 mg by mouth every 4 (four) hours as needed for cough.    [provider]    lactulose (CHRONULAC) 10 GM/15ML solution Take 20 g by mouth daily as needed for mild constipation.    [provider]  levothyroxine (SYNTHROID, LEVOTHROID) 112 MCG tablet Take 112 mcg by mouth daily.     [provider]  loperamide (IMODIUM A-D) 2 MG capsule Take 2 mg by mouth See admin instructions. Take 2 mg by mouth with each loose stool- up to 8 doses/24 hours as needed for diarrhea and call MD  if it persists    [provider]  lubiprostone (AMITIZA) 24 MCG capsule Take 24 mcg by mouth 2 (two) times daily with a meal.    [provider]  memantine (NAMENDA) 10 MG tablet Take 10 mg by mouth 2 (two) times daily.    [provider]  metoprolol tartrate (LOPRESSOR) 25 MG tablet Take 12.5 mg by mouth 2 (two) times daily.     [provider]  NON FORMULARY See admin instructions. "Health Shake": Drink 1 health shake by mouth three times a day with meals    [provider]  Omega-3 Fatty Acids (FISH OIL) 1000 MG CAPS Take 1,000 mg by mouth daily. Do not crush    [provider]  ondansetron (ZOFRAN) 4 MG tablet Take 4 mg by mouth every 8 (eight) hours as needed for nausea or vomiting.    [provider]  oxyCODONE (OXY IR/ROXICODONE) 5 MG immediate release tablet Take 1 tablet (5 mg total) by mouth every 4 (four) hours as needed for breakthrough pain. Patient not taking: Reported on 04/08/2019 05/28/17   Oswald Hillock, MD  PEG 3350-KCl-NaBcb-NaCl-NaSulf (PEG-3350/ELECTROLYTES PO) Take 17 g by mouth daily. TO BE MIXED WITH 8 OUNCES OF FLUID    [provider]  senna (SENOKOT) 8.6 MG tablet Take 2 tablets by mouth daily as needed for constipation.    [provider]  senna-docusate (SENOKOT-S) 8.6-50 MG tablet Take 1 tablet by mouth 2 (two) times daily. Patient taking differently: Take 1 tablet by mouth daily as needed for mild constipation.  05/28/17   Oswald Hillock, MD  sertraline (ZOLOFT) 100 MG tablet  Take 150 mg by mouth at bedtime.     [provider]  traMADol (ULTRAM) 50 MG tablet Take 1 tablet (50 mg total) by mouth every 6 (six) hours as needed. 04/08/19   Isla Pence, MD  traMADol (ULTRAM) 50 MG tablet Take 25 mg by mouth every 6 (six) hours as needed (for pain).    [provider]  traZODone (DESYREL) 100 MG tablet Take 1 tablet (100 mg total) by mouth at bedtime. 09/30/16   Deneise Lever, MD   No results found.  Positive ROS: All other systems have been reviewed and were otherwise negative with the exception of those mentioned in the HPI and as above.  Labs cbc No results for input(s): WBC, HGB, HCT, PLT in the last 72 hours.  Labs inflam No results for input(s): CRP in the last 72 hours.  Invalid input(s): ESR  Labs coag No results for input(s): INR, PTT in the last 72 hours.  Invalid input(s): PT  No results for input(s): NA, K, CL, CO2, GLUCOSE, BUN, CREATININE, CALCIUM in the last 72 hours.  Physical Exam: Vitals:   04/13/19 0738  BP: (!) 151/76  Pulse: (!) 105  Resp: 16  Temp: 98.2 F (36.8 C)  SpO2: 98%   General: Alert, no acute distress Cardiovascular: No pedal edema Respiratory: No cyanosis, no use of accessory musculature GI: No organomegaly, abdomen is soft and non-tender Skin: No lesions in the area of chief complaint other than those listed below in MSK exam.  Neurologic: Sensation intact distally save for the below mentioned MSK exam Psychiatric: Patient is competent for consent with normal mood and affect Lymphatic: No axillary or cervical lymphadenopathy  MUSCULOSKELETAL:  LUE: NVI, compartments soft Other extremities are atraumatic with painless ROM and NVI.  Assessment: L distal radial fracture  Plan: HWR and ORIF with  long DVR plate today   Renette Butters, MD Cell [phone]   04/13/2019 7:51 AM

## 2019-04-13 NOTE — Anesthesia Procedure Notes (Signed)
Procedure Name: LMA Insertion Date/Time: 04/13/2019 9:02 AM Performed by: Lorelee Market, CRNA Pre-anesthesia Checklist: Patient identified, Emergency Drugs available, Suction available, Patient being monitored and Timeout performed Patient Re-evaluated:Patient Re-evaluated prior to induction Oxygen Delivery Method: Circle system utilized Preoxygenation: Pre-oxygenation with 100% oxygen Induction Type: IV induction LMA: LMA inserted LMA Size: 3.0 Number of attempts: 1 Placement Confirmation: positive ETCO2 and breath sounds checked- equal and bilateral Tube secured with: Tape Dental Injury: Teeth and Oropharynx as per pre-operative assessment

## 2019-04-13 NOTE — Anesthesia Procedure Notes (Addendum)
Anesthesia Regional Block: Supraclavicular block   Pre-Anesthetic Checklist: ,, timeout performed, Correct Patient, Correct Site, Correct Laterality, Correct Procedure, Correct Position, site marked, Risks and benefits discussed,  Surgical consent,  Pre-op evaluation,  At surgeon's request and post-op pain management  Laterality: Left  Prep: chloraprep       Needles:  Injection technique: Single-shot  Needle Type: Echogenic Stimulator Needle     Needle Length: 5cm  Needle Gauge: 22     Additional Needles:   Procedures:, nerve stimulator,,, ultrasound used (permanent image in chart),,,,   Nerve Stimulator or Paresthesia:  Response: hand, 0.45 mA,   Additional Responses:   Narrative:  Start time: 04/13/2019 8:38 AM End time: 04/13/2019 8:43 AM Injection made incrementally with aspirations every 5 mL.  Performed by: Personally  Anesthesiologist: Bethena Midget, MD  Additional Notes: Functioning IV was confirmed and monitors were applied.  A 64mm 22ga Arrow echogenic stimulator needle was used. Sterile prep and drape,hand hygiene and sterile gloves were used. Ultrasound guidance: relevant anatomy identified, needle position confirmed, local anesthetic spread visualized around nerve(s)., vascular puncture avoided.  Image printed for medical record. Negative aspiration and negative test dose prior to incremental administration of local anesthetic. The patient tolerated the procedure well.

## 2019-04-13 NOTE — Progress Notes (Signed)
Assisted Dr.Ernest Oddono  with Left Shoulder Block. Side rails up, monitors on throughout procedure. See vital signs in flow sheet. Tolerated Procedure well.

## 2019-04-13 NOTE — Anesthesia Postprocedure Evaluation (Signed)
Anesthesia Post Note  Patient: [name]  Procedure(s) Performed: OPEN REDUCTION INTERNAL FIXATION (ORIF) LEFT RADIAL SHAFT FRACTURE WITH HARDWARE REMOVAL (Left Arm Lower)     Patient location during evaluation: PACU Anesthesia Type: General Level of consciousness: awake and alert Pain management: pain level controlled Vital Signs Assessment: post-procedure vital signs reviewed and stable Respiratory status: spontaneous breathing, nonlabored ventilation, respiratory function stable and patient connected to nasal cannula oxygen Cardiovascular status: blood pressure returned to baseline and stable Postop Assessment: no apparent nausea or vomiting Anesthetic complications: no    Last Vitals:  Vitals:   04/13/19 0848 04/13/19 1037  BP:  (!) 114/57  Pulse: (!) 108 96  Resp: 17 15  Temp:  36.9 C  SpO2: 100% 100%    Last Pain:  Vitals:   04/13/19 1037  TempSrc:   PainSc: 0-No pain                 Carnetta Losada

## 2019-04-13 NOTE — Op Note (Signed)
04/13/2019  1:19 PM  PATIENT:  [name]    PRE-OPERATIVE DIAGNOSIS:  LEFT RADIAL SHAFT FRACTURE  POST-OPERATIVE DIAGNOSIS:  Same  PROCEDURE:  OPEN REDUCTION INTERNAL FIXATION (ORIF) LEFT RADIAL SHAFT FRACTURE WITH HARDWARE REMOVAL  SURGEON:  Sheral Apley, MD  ASSISTANT: Aquilla Hacker, PA-C, he was present and scrubbed throughout the case, critical for completion in a timely fashion, and for retraction, instrumentation, and closure.   ANESTHESIA:   gen  PREOPERATIVE INDICATIONS:  LUKESHA FUERST is a  78 y.o. female with a diagnosis of LEFT RADIAL SHAFT FRACTURE who failed conservative measures and elected for surgical management.    The risks benefits and alternatives were discussed with the patient preoperatively including but not limited to the risks of infection, bleeding, nerve injury, cardiopulmonary complications, the need for revision surgery, among others, and the patient was willing to proceed.  OPERATIVE IMPLANTS: dvr long plate  OPERATIVE FINDINGS: unstable fracture  BLOOD LOSS: min  COMPLICATIONS: none  TOURNIQUET TIME:  OPERATIVE PROCEDURE:  Patient was identified in the preoperative holding area and site was marked by me She was transported to the operating theater and placed on the table in supine position taking care to pad all bony prominences. After a preincinduction time out anesthesia was induced. The left upper extremity was prepped and draped in normal sterile fashion and a pre-incision timeout was performed. She received ancef for preoperative antibiotics.   I made an incision through her previous distal radius incision and carried this proximally.  I maintained hemostasis and protected all neurovascular structures I incised her tendon sheath and retracted her FCR tendon radially I then incised to the posterior aspect I was able to bluntly dissect her forearm flexor muscles ulnarly away from her anterior radius.  Fracture was unstable at the  proximal aspect of her plate.  I removed her previous plate I then selected a longer plate of the same previous model of DVR plate I was able to use the same screw holes to to fix this distally in the same peg holes to provide next added stability.  I then had 4 screw holes proximal to the fracture I reduced the fracture held in an anatomic position and fix it in compression using the plate and had 4 bicortical screws proximally.  I was very happy with the reduction and alignment I took for x-rays of her distal radius and again was happy with the placement of all hardware and fracture reduction wound was then irrigated incision was closed in layers sterile dressing and splint were applied she was awoken and taken the PACU in stable condition  POST OPERATIVE PLAN: Mobilize for DVT prophylaxis splint full-time until follow-up

## 2019-04-13 NOTE — Interval H&P Note (Signed)
I participated in the care of this patient and agree with the above history, physical and evaluation. I performed a review of the history and a physical exam as detailed   Osmond Steckman Daniel Deryl Ports MD  

## 2019-04-13 NOTE — Discharge Instructions (Signed)
Keep wrist elevated with ice as much as possible to reduce pain and swelling.  Diet: As you were doing prior to hospitalization   Shower:  You have a splint on, leave the splint in place and keep the splint dry with a plastic bag.  Dressing:  You have a splint - leave the splint in place and we will change your bandages during your first follow-up appointment.    Activity:  Increase activity slowly as tolerated, but follow the weight bearing instructions below.  The rules on driving is that you can not be taking narcotics while you drive, and you must feel in control of the vehicle.    Weight Bearing:  Non weight bearing affected wrist.  Sling for comfort.   To prevent constipation: you may use a stool softener such as -  Colace (over the counter) 100 mg by mouth twice a day  Drink plenty of fluids (prune juice may be helpful) and high fiber foods Miralax (over the counter) for constipation as needed.    Itching:  If you experience itching with your medications, try taking only a single pain pill, or even half a pain pill at a time.  You can also use benadryl over the counter for itching or also to help with sleep.   Precautions:  If you experience chest pain or shortness of breath - call 911 immediately for transfer to the hospital emergency department!!  If you develop a fever greater that 101 F, purulent drainage from wound, increased redness or drainage from wound, or calf pain -- Call the office at [phone]                                         Follow- Up Appointment:  Please call for an appointment to be seen in 1-2 weeks [place] - [phone]

## 2019-04-13 NOTE — Transfer of Care (Signed)
Immediate Anesthesia Transfer of Care Note  Patient: [name]  Procedure(s) Performed: OPEN REDUCTION INTERNAL FIXATION (ORIF) LEFT RADIAL SHAFT FRACTURE WITH HARDWARE REMOVAL (Left Arm Lower)  Patient Location: PACU  Anesthesia Type:General  Level of Consciousness: awake, alert , oriented and patient cooperative  Airway & Oxygen Therapy: Patient Spontanous Breathing and Patient connected to face mask oxygen  Post-op Assessment: Report given to RN, Post -op Vital signs reviewed and stable and Patient moving all extremities  Post vital signs: Reviewed and stable  Last Vitals:  Vitals Value Taken Time  BP 114/57 04/13/2019 10:37 AM  Temp    Pulse 99 04/13/2019 10:38 AM  Resp 20 04/13/2019 10:38 AM  SpO2 100 % 04/13/2019 10:38 AM  Vitals shown include unvalidated device data.  Last Pain:  Vitals:   04/13/19 0835  TempSrc:   PainSc: 0-No pain         Complications: No apparent anesthesia complications

## 2019-04-14 ENCOUNTER — Encounter (HOSPITAL_COMMUNITY): Payer: Self-pay | Admitting: Orthopedic Surgery

## 2019-04-14 ENCOUNTER — Telehealth: Payer: Self-pay | Admitting: *Deleted

## 2019-04-14 DIAGNOSIS — M96632 Fracture of radius or ulna following insertion of orthopedic implant, joint prosthesis, or bone plate, left arm: Secondary | ICD-10-CM | POA: Diagnosis not present

## 2019-04-14 MED ORDER — TRAMADOL HCL 50 MG PO TABS: 50.0000 mg | ORAL_TABLET | Freq: Four times a day (QID) | ORAL | 0 refills | Status: AC | PRN

## 2019-04-14 NOTE — Plan of Care (Signed)
Plan of care reviewed with the patient. 

## 2019-04-14 NOTE — Discharge Summary (Signed)
Discharge Summary  Patient ID: [name] MRN: [id number] DOB/AGE: 76-Feb-1942 76 y.o.  Admit date: 04/13/2019 Discharge date: 04/14/2019  Admission Diagnoses:  Closed displaced transverse fracture of shaft of left radius  Discharge Diagnoses:  Principal Problem:   Closed displaced transverse fracture of shaft of left radius   Past Medical History:  Diagnosis Date  . Abnormal heart rhythm   . Abnormality of gait 12/21/2015  . Afib (HCC)    h/o  . Anxiety   . Arthritis    "hips, shoulders" (01/05/2015)  . Back pain   . Chronic kidney disease (CKD), stage III (moderate) (HCC)    [name] 01/05/2015  . Dementia (HCC)   . Depression   . Echocardiogram abnormal 02/26/11   MVP,mild MR,AOV mildly sclerotic. EF >55%  . GERD (gastroesophageal reflux disease)   . Hypertension   . Hypothyroidism   . Memory difficulty 06/05/2016  . Nausea & vomiting 11/2016  . Osteoporosis   . Osteoporosis   . Reactive airway disease   . S/P right hip fracture 06/2017   No surgery required  . Scoliosis   . Sleep apnea    "suppose to wear a mask; can't tolerate it" (01/05/2015)    Surgeries: Procedure(s): OPEN REDUCTION INTERNAL FIXATION (ORIF) LEFT RADIAL SHAFT FRACTURE WITH HARDWARE REMOVAL on 04/13/2019   Consultants (if any):   Discharged Condition: Improved  Hospital Course: [name] is an 78 y.o. female who was admitted 04/13/2019 with a diagnosis of Closed displaced transverse fracture of shaft of left radius and went to the operating room on 04/13/2019 and underwent the above named procedures.    She was given perioperative antibiotics:  Anti-infectives (From admission, onward)   Start     Dose/Rate Route Frequency Ordered Stop   04/13/19 2100  ceFAZolin (ANCEF) IVPB 1 g/50 mL premix     1 g 100 mL/hr over 30 Minutes Intravenous Every 12 hours 04/13/19 1207 04/14/19 2059   04/13/19 0730  ceFAZolin (ANCEF) IVPB 2g/100 mL premix     2 g 200 mL/hr over 30 Minutes Intravenous On call  to O.R. 04/13/19 4008 04/13/19 6761    .  She was given sequential compression devices, and early ambulation for DVT prophylaxis.  She is also on Plavix.  She benefited maximally from the hospital stay and there were no complications.    Recent vital signs:  Vitals:   04/14/19 0143 04/14/19 0505  BP: (!) 127/57 133/71  Pulse: 69 77  Resp: 18 18  Temp: 98.4 F (36.9 C) 98.2 F (36.8 C)  SpO2: 100% 100%    Recent laboratory studies:  Lab Results  Component Value Date   HGB 10.6 (L) 04/13/2019   HGB 12.4 01/09/2018   HGB 10.7 (L) 05/28/2017   Lab Results  Component Value Date   WBC 6.7 04/13/2019   PLT 179 04/13/2019   Lab Results  Component Value Date   INR 1.06 05/26/2017   Lab Results  Component Value Date   NA 139 04/13/2019   K 3.6 04/13/2019   CL 103 04/13/2019   CO2 23 04/13/2019   BUN 24 (H) 04/13/2019   CREATININE 1.29 (H) 04/13/2019   GLUCOSE 65 (L) 04/13/2019    Discharge Medications:   Allergies as of 04/14/2019      Reactions   Vicodin [hydrocodone-acetaminophen] Nausea And Vomiting      Medication List    STOP taking these medications   oxyCODONE 5 MG immediate release tablet Commonly known as:  Oxy  IR/ROXICODONE     TAKE these medications   acetaminophen 325 MG tablet Commonly known as:  TYLENOL Take 650 mg by mouth 2 (two) times a day. What changed:  Another medication with the same name was removed. Continue taking this medication, and follow the directions you see here.   albuterol (2.5 MG/3ML) 0.083% nebulizer solution Commonly known as:  PROVENTIL Take 2.5 mg by nebulization See admin instructions. Nebulize 2.5 mg every 4-6 hours as needed for wheezing or shortness of breath   anti-nausea solution Take 30 mLs by mouth every 15 (fifteen) minutes as needed for nausea or vomiting (up to 4 doses in 1 hour and notify MD if no relief).   arformoterol 15 MCG/2ML Nebu Commonly known as:  BROVANA Take 2 mLs (15 mcg total) by  nebulization 2 (two) times daily.   baclofen 10 MG tablet Commonly known as:  LIORESAL Take 10 mg by mouth at bedtime as needed for muscle spasms.   Banophen 25 mg capsule Generic drug:  diphenhydrAMINE Take 25 mg by mouth every 4 (four) hours as needed for itching.   budesonide-formoterol 160-4.5 MCG/ACT inhaler Commonly known as:  SYMBICORT Inhale 2 puffs into the lungs 2 (two) times daily. Rinse mouth after use - discard 3 months after opening   calcitRIOL 0.25 MCG capsule Commonly known as:  ROCALTROL Take 0.25 mcg by mouth every Monday, Wednesday, and Friday. Do not crush   clonazePAM 2 MG tablet Commonly known as:  KLONOPIN Take 1 tablet (2 mg total) by mouth at bedtime.   clopidogrel 75 MG tablet Commonly known as:  PLAVIX Take 1 tablet (75 mg total) by mouth daily.   diltiazem 120 MG 24 hr capsule Commonly known as:  CARDIZEM CD Take 120 mg by mouth daily.   diltiazem 30 MG tablet Commonly known as:  CARDIZEM Take 30 mg by mouth at bedtime as needed (for a pulse rate greater than 110).   famotidine 40 MG tablet Commonly known as:  PEPCID Take 40 mg by mouth daily.   feeding supplement (PRO-STAT SUGAR FREE 64) Liqd Take 30 mLs by mouth daily. CITRUS FLAVOR   Fish Oil 1000 MG Caps Take 1,000 mg by mouth daily. Do not crush   guaifenesin 100 MG/5ML syrup Commonly known as:  ROBITUSSIN Take 200 mg by mouth every 4 (four) hours as needed for cough.   Imodium A-D 2 MG capsule Generic drug:  loperamide Take 2 mg by mouth See admin instructions. Take 2 mg by mouth with each loose stool- up to 8 doses/24 hours as needed for diarrhea and call MD if it persists   lactulose 10 GM/15ML solution Commonly known as:  CHRONULAC Take 20 g by mouth daily as needed for mild constipation.   levothyroxine 112 MCG tablet Commonly known as:  SYNTHROID Take 112 mcg by mouth daily.   Lomotil 2.5-0.025 MG tablet Generic drug:  diphenoxylate-atropine Take 1 tablet by mouth  every 8 (eight) hours as needed for diarrhea or loose stools.   lubiprostone 24 MCG capsule Commonly known as:  AMITIZA Take 24 mcg by mouth 2 (two) times daily with a meal.   memantine 10 MG tablet Commonly known as:  NAMENDA Take 10 mg by mouth 2 (two) times daily.   metoprolol tartrate 25 MG tablet Commonly known as:  LOPRESSOR Take 12.5 mg by mouth 2 (two) times daily.   NON FORMULARY See admin instructions. "Health Shake": Drink 1 health shake by mouth three times a day with meals  ondansetron 4 MG tablet Commonly known as:  ZOFRAN Take 4 mg by mouth every 8 (eight) hours as needed for nausea or vomiting.   OYSTER SHELL CALCIUM 500+D PO Take 1 tablet by mouth 2 (two) times a day.   PEG-3350/ELECTROLYTES PO Take 17 g by mouth daily. TO BE MIXED WITH 8 OUNCES OF FLUID   Pulmicort 0.5 MG/2ML nebulizer solution Generic drug:  budesonide Take 2 mLs (0.5 mg total) by nebulization 2 (two) times daily.   senna 8.6 MG tablet Commonly known as:  SENOKOT Take 2 tablets by mouth daily as needed for constipation.   senna-docusate 8.6-50 MG tablet Commonly known as:  Senokot-S Take 1 tablet by mouth 2 (two) times daily. What changed:    when to take this  reasons to take this   traMADol 50 MG tablet Commonly known as:  Ultram Take 1 tablet (50 mg total) by mouth every 6 (six) hours as needed for up to 5 days for severe pain. What changed:    reasons to take this  Another medication with the same name was removed. Continue taking this medication, and follow the directions you see here.   traZODone 100 MG tablet Commonly known as:  DESYREL Take 1 tablet (100 mg total) by mouth at bedtime.   Zoloft 100 MG tablet Generic drug:  sertraline Take 150 mg by mouth at bedtime.       Diagnostic Studies: Dg Chest 2 View  Result Date: 04/08/2019 CLINICAL DATA:  Chest pain fall EXAM: CHEST - 2 VIEW COMPARISON:  03/23/2019, 02/13/2019, 04/26/2016 FINDINGS: Linear scarring  or atelectasis at the left base. No acute consolidation or effusion. Stable cardiomediastinal silhouette with tortuous aorta. Aortic atherosclerosis. No pneumothorax. IMPRESSION: No active cardiopulmonary disease. Mild scarring or atelectasis at the left base Electronically Signed   By: Jasmine Pang M.D.   On: 04/08/2019 19:52   Dg Ribs Unilateral W/chest Right  Result Date: 03/23/2019 CLINICAL DATA:  Bruising.  Right-sided rib pain.  Recent fall. EXAM: RIGHT RIBS AND CHEST - 3+ VIEW COMPARISON:  02/13/2019 FINDINGS: No fracture or other bone lesions are seen involving the ribs. There is no evidence of pneumothorax or pleural effusion. Both lungs are clear. Heart size and mediastinal contours are within normal limits. Degenerative changes are noted throughout the thoracolumbar spine with an S shaped curvature similar to prior studies. IMPRESSION: No displaced right-sided rib fracture.  No pneumothorax. Electronically Signed   By: Katherine Mantle M.D.   On: 03/23/2019 20:58   Dg Pelvis 1-2 Views  Result Date: 04/08/2019 CLINICAL DATA:  Pain after fall EXAM: PELVIS - 1-2 VIEW COMPARISON:  CT 05/26/2017 FINDINGS: SI joints are non widened. Pubic symphysis and rami are intact. Both femoral heads project in joint. Deformity at the greater trochanter of the right femur, felt to correspond to previously noted fracture. IMPRESSION: 1. Chronic fracture deformity of the greater trochanter of the right femur 2. No definite acute osseous abnormality Electronically Signed   By: Jasmine Pang M.D.   On: 04/08/2019 19:54   Dg Wrist 2 Views Left  Result Date: 04/08/2019 CLINICAL DATA:  78 year old female post reduction film. EXAM: LEFT WRIST - 2 VIEW COMPARISON:  Left wrist series earlier today. FINDINGS: AP and lateral views of the left wrist with overlying splint/cast in place. Mildly comminuted transverse fracture along the proximal margin of the distal radius ORIF hardware demonstrates improved alignment with  stable mild radial displacement but largely resolved ventral displacement and dorsal angulation. No new osseous  abnormality identified. IMPRESSION: Improved alignment about the distal radius shaft fracture along the proximal margin of the ORIF. Electronically Signed   By: Odessa FlemingH  Hall M.D.   On: 04/08/2019 21:04   Dg Wrist Complete Left  Result Date: 04/08/2019 CLINICAL DATA:  Wrist pain after fall EXAM: LEFT WRIST - COMPLETE 3+ VIEW COMPARISON:  12/21/2009 FINDINGS: Bones appear osteopenic. Moderate arthritis at the first [facility] joint and STT interval. Surgical plate and screw fixation of prior fracture of the distal radius. Acute mildly comminuted fracture involving the distal shaft of the radius at the proximal end of the fixating plate and screws. Moderate dorsal angulation of distal fracture fragment and slight radial, less than 1/4 bone with displacement. IMPRESSION: Prior surgical plate and screw fixation for distal radius fracture. Acute, mildly comminuted displaced and angulated fracture involving distal shaft of the radius at the proximal aspect of the fixating plate and screws. Electronically Signed   By: Jasmine PangKim  Fujinaga M.D.   On: 04/08/2019 19:52   Ct Head Wo Contrast  Result Date: 03/23/2019 CLINICAL DATA:  Fall from wheelchair, headache. EXAM: CT HEAD WITHOUT CONTRAST TECHNIQUE: Contiguous axial images were obtained from the base of the skull through the vertex without intravenous contrast. COMPARISON:  02/13/2019 FINDINGS: Brain: Extensive chronic microvascular disease throughout the deep white matter. Mild cerebral atrophy. No acute intracranial abnormality. Specifically, no hemorrhage, hydrocephalus, mass lesion, acute infarction, or significant intracranial injury. Vascular: No hyperdense vessel or unexpected calcification. Skull: No acute calvarial abnormality. Sinuses/Orbits: Visualized paranasal sinuses and mastoids clear. Orbital soft tissues unremarkable. Other: None IMPRESSION: Atrophy, chronic  microvascular disease. No acute intracranial abnormality. Electronically Signed   By: Charlett NoseKevin  Dover M.D.   On: 03/23/2019 21:08    Disposition: Discharge disposition: 01-Home or Self Care     [facility]    Discharge Instructions    Discharge patient   Complete by:  As directed    Discharge disposition:  01-Home or Self Care   Discharge patient date:  04/14/2019      Follow-up Information    Sheral ApleyMurphy, Timothy D, MD.   Specialty:  Orthopedic Surgery Contact information: [address] [place] [phone]            Signed: Albina BilletHenry Calvin Martensen III PA-C 04/14/2019, 6:49 AM

## 2019-04-14 NOTE — Progress Notes (Signed)
D/C instructions given to Para March, the nurse caring for pt at her facility. Prescription sent with instructions.

## 2019-04-14 NOTE — Telephone Encounter (Signed)
Unable to reach,without a code.

## 2019-04-14 NOTE — Evaluation (Signed)
Occupational Therapy Evaluation Patient Details Name: [name] MRN: [id number] DOB: [birth year] Today's Date: 04/14/2019    History of Present Illness [name] is a  78 y.o. female with a diagnosis of LEFT RADIAL SHAFT FRACTURE who failed conservative measures and elected for surgical management.     Clinical Impression   Pt admitted with fall and then had sx per ortho. Pt currently with functional limitations due to the deficits listed below (see OT Problem List).  Pt will benefit from skilled OT to increase their safety and independence with ADL and functional mobility for ADL to facilitate discharge to venue listed below.      Follow Up Recommendations  Supervision/Assistance - 24 hour;Other (comment)(OT at ALF)   Pt will need increased A with ADL activity at ALF.    Plan is for pt to return to ALF this day.  Recommend increased care at ALF as well as HHOT at ALF  At ALF staff Aed pt with all transfers but pt has fallen out of her WC several times per pt.     Equipment Recommendations  None recommended by OT - defer further OT tx back to ALF    Recommendations for Other Services       Precautions / Restrictions Restrictions Weight Bearing Restrictions: No      Mobility Bed Mobility Overal bed mobility: Needs Assistance Bed Mobility: Supine to Sit;Sit to Supine     Supine to sit: Mod assist Sit to supine: Mod assist   General bed mobility comments: increased time  Transfers Overall transfer level: Needs assistance   Transfers: Sit to/from Stand Sit to Stand: Max assist;+2 safety/equipment;+2 physical assistance                  ADL either performed or assessed with clinical judgement   ADL Overall ADL's : Needs assistance/impaired Eating/Feeding: Minimal assistance;Sitting   Grooming: Minimal assistance;Sitting   Upper Body Bathing: Minimal assistance;Sitting   Lower Body Bathing: Sit to/from stand;Maximal assistance;Cueing for  sequencing;Cueing for safety;+2 for safety/equipment   Upper Body Dressing : Minimal assistance;Sitting   Lower Body Dressing: Maximal assistance;Sit to/from stand;Cueing for sequencing;Cueing for safety;+2 for safety/equipment   Toilet Transfer: Maximal assistance;Cueing for safety Toilet Transfer Details (indicate cue type and reason): sit to stand Toileting- Clothing Manipulation and Hygiene: +2 for safety/equipment;+2 for physical assistance;Sit to/from stand;Total assistance               Vision Patient Visual Report: No change from baseline              Pertinent Vitals/Pain Pain Assessment: 0-10 Pain Score: 2  Pain Descriptors / Indicators: Sore Pain Intervention(s): Limited activity within patient's tolerance;Repositioned     Hand Dominance     Extremity/Trunk Assessment Upper Extremity Assessment Upper Extremity Assessment: LUE deficits/detail LUE Deficits / Details: s/p radial sx            Communication Communication Communication: No difficulties   Cognition Arousal/Alertness: Awake/alert Behavior During Therapy: WFL for tasks assessed/performed Overall Cognitive Status: History of cognitive impairments - at baseline                                     General Comments   Educated pt on moving fingers as well as positioning fingers in extension.  Educated pt on ROM L shoulder also.  Home Living Family/patient expects to be discharged to:: Assisted living Living Arrangements: Other (Comment)(assisted living)                                      Prior Functioning/Environment Level of Independence: Needs assistance                 OT Problem List: Decreased strength;Decreased activity tolerance;Impaired balance (sitting and/or standing);Decreased safety awareness;Decreased knowledge of precautions;Decreased knowledge of use of DME or AE;Decreased range of motion      OT Treatment/Interventions:  Self-care/ADL training;DME and/or AE instruction;Patient/family education;Therapeutic activities    OT Goals(Current goals can be found in the care plan section) Acute Rehab OT Goals Patient Stated Goal: go back to ALF OT Goal Formulation: With patient Time For Goal Achievement: 04/28/19 Potential to Achieve Goals: Good  OT Frequency:                AM-PAC OT "6 Clicks" Daily Activity     Outcome Measure Help from another person eating meals?: A Little Help from another person taking care of personal grooming?: A Little Help from another person toileting, which includes using toliet, bedpan, or urinal?: Total Help from another person bathing (including washing, rinsing, drying)?: A Lot Help from another person to put on and taking off regular upper body clothing?: A Lot Help from another person to put on and taking off regular lower body clothing?: Total 6 Click Score: 12   End of Session Equipment Utilized During Treatment: Gait belt Nurse Communication: Mobility status  Activity Tolerance: Patient tolerated treatment well Patient left: in bed  OT Visit Diagnosis: Unsteadiness on feet (R26.81);Repeated falls (R29.6);History of falling (Z91.81)                Time: [id number] OT Time Calculation (min): 17 min Charges:  OT General Charges $OT Visit: 1 Visit OT Evaluation $OT Eval Moderate Complexity: 1 Mod  Lise AuerLori Taniqua Issa, OT Acute Rehabilitation Services [phone] Office- [phone]     Koleton Duchemin, Karin GoldenLorraine D 04/14/2019, 11:57 AM

## 2019-04-14 NOTE — Care Management Obs Status (Signed)
MEDICARE OBSERVATION STATUS NOTIFICATION   Patient Details  Name: [name] MRN: [id number] Date of Birth: 1941-04-13   Medicare Observation Status Notification Given:  Yes    Golda Acre, RN 04/14/2019, 9:41 AM

## 2019-04-14 NOTE — Addendum Note (Signed)
Addendum  created 04/14/19 1057 by Bethena Midget, MD   Clinical Note Signed, Intraprocedure Blocks edited

## 2019-05-01 ENCOUNTER — Emergency Department (HOSPITAL_COMMUNITY): Payer: Medicare Other

## 2019-05-01 ENCOUNTER — Encounter (HOSPITAL_COMMUNITY): Payer: Self-pay | Admitting: Emergency Medicine

## 2019-05-01 ENCOUNTER — Emergency Department (HOSPITAL_COMMUNITY)
Admission: EM | Admit: 2019-05-01 | Discharge: 2019-05-01 | Disposition: A | Discharge status: Home / Self Care | Payer: Medicare Other | Attending: Emergency Medicine | Admitting: Emergency Medicine

## 2019-05-01 DIAGNOSIS — E039 Hypothyroidism, unspecified: Secondary | ICD-10-CM | POA: Insufficient documentation

## 2019-05-01 DIAGNOSIS — R109 Unspecified abdominal pain: Secondary | ICD-10-CM | POA: Insufficient documentation

## 2019-05-01 DIAGNOSIS — R112 Nausea with vomiting, unspecified: Secondary | ICD-10-CM | POA: Diagnosis not present

## 2019-05-01 DIAGNOSIS — I4891 Unspecified atrial fibrillation: Secondary | ICD-10-CM | POA: Diagnosis not present

## 2019-05-01 DIAGNOSIS — I129 Hypertensive chronic kidney disease with stage 1 through stage 4 chronic kidney disease, or unspecified chronic kidney disease: Secondary | ICD-10-CM | POA: Diagnosis not present

## 2019-05-01 DIAGNOSIS — R197 Diarrhea, unspecified: Secondary | ICD-10-CM | POA: Diagnosis not present

## 2019-05-01 DIAGNOSIS — J45909 Unspecified asthma, uncomplicated: Secondary | ICD-10-CM | POA: Diagnosis not present

## 2019-05-01 DIAGNOSIS — Z79899 Other long term (current) drug therapy: Secondary | ICD-10-CM | POA: Diagnosis not present

## 2019-05-01 DIAGNOSIS — N183 Chronic kidney disease, stage 3 (moderate): Secondary | ICD-10-CM | POA: Insufficient documentation

## 2019-05-01 DIAGNOSIS — F0390 Unspecified dementia without behavioral disturbance: Secondary | ICD-10-CM | POA: Diagnosis not present

## 2019-05-01 LAB — COMPREHENSIVE METABOLIC PANEL
ALT: 18 U/L (ref 0–44)
AST: 30 U/L (ref 15–41)
Albumin: 2.3 g/dL — ABNORMAL LOW (ref 3.5–5.0)
Alkaline Phosphatase: 79 U/L (ref 38–126)
Anion gap: 11 (ref 5–15)
BUN: 15 mg/dL (ref 8–23)
CO2: 29 mmol/L (ref 22–32)
Calcium: 9.4 mg/dL (ref 8.9–10.3)
Chloride: 99 mmol/L (ref 98–111)
Creatinine, Ser: 1.01 mg/dL — ABNORMAL HIGH (ref 0.44–1.00)
GFR calc Af Amer: >60 mL/min (ref >60)
GFR calc non Af Amer: 54 mL/min — ABNORMAL LOW (ref >60)
Glucose, Bld: 105 mg/dL — ABNORMAL HIGH (ref 70–99)
Potassium: 3.3 mmol/L — ABNORMAL LOW (ref 3.5–5.1)
Sodium: 139 mmol/L (ref 135–145)
Total Bilirubin: 0.6 mg/dL (ref 0.3–1.2)
Total Protein: 6.6 g/dL (ref 6.5–8.1)

## 2019-05-01 LAB — URINALYSIS, ROUTINE W REFLEX MICROSCOPIC
Bilirubin Urine: NEGATIVE
Glucose, UA: NEGATIVE mg/dL
Ketones, ur: NEGATIVE mg/dL
Leukocytes,Ua: NEGATIVE
Nitrite: NEGATIVE
Protein, ur: NEGATIVE mg/dL
Specific Gravity, Urine: 1.01 (ref 1.005–1.030)
pH: 6.5 (ref 5.0–8.0)

## 2019-05-01 LAB — CBC WITH DIFFERENTIAL/PLATELET
Abs Immature Granulocytes: 0.09 10*3/uL — ABNORMAL HIGH (ref 0.00–0.07)
Basophils Absolute: 0 10*3/uL (ref 0.0–0.1)
Basophils Relative: 0 %
Eosinophils Absolute: 0 10*3/uL (ref 0.0–0.5)
Eosinophils Relative: 0 %
HCT: 31.3 % — ABNORMAL LOW (ref 36.0–46.0)
Hemoglobin: 10.1 g/dL — ABNORMAL LOW (ref 12.0–15.0)
Immature Granulocytes: 1 %
Lymphocytes Relative: 6 %
Lymphs Abs: 0.7 10*3/uL (ref 0.7–4.0)
MCH: 30.6 pg (ref 26.0–34.0)
MCHC: 32.3 g/dL (ref 30.0–36.0)
MCV: 94.8 fL (ref 80.0–100.0)
Monocytes Absolute: 1.1 10*3/uL — ABNORMAL HIGH (ref 0.1–1.0)
Monocytes Relative: 10 %
Neutro Abs: 8.5 10*3/uL — ABNORMAL HIGH (ref 1.7–7.7)
Neutrophils Relative %: 83 %
Platelets: 221 10*3/uL (ref 150–400)
RBC: 3.3 MIL/uL — ABNORMAL LOW (ref 3.87–5.11)
RDW: 12.6 % (ref 11.5–15.5)
WBC: 10.3 10*3/uL (ref 4.0–10.5)
nRBC: 0 % (ref 0.0–0.2)

## 2019-05-01 LAB — LIPASE, BLOOD: Lipase: 33 U/L (ref 11–51)

## 2019-05-01 LAB — URINALYSIS, MICROSCOPIC (REFLEX)

## 2019-05-01 MED ORDER — IOHEXOL 300 MG/ML  SOLN
100.0000 mL | Freq: Once | INTRAMUSCULAR | Status: AC | PRN
  Administered 2019-05-01: 100 mL via INTRAVENOUS

## 2019-05-01 MED ORDER — SODIUM CHLORIDE 0.9 % IV SOLN
INTRAVENOUS | Status: DC
  Administered 2019-05-01: 03:00:00 via INTRAVENOUS

## 2019-05-01 MED ORDER — ONDANSETRON HCL 4 MG/2ML IJ SOLN
4.0000 mg | Freq: Once | INTRAMUSCULAR | Status: AC
  Administered 2019-05-01: 4 mg via INTRAVENOUS
  Filled 2019-05-01: qty 2

## 2019-05-01 MED ORDER — ONDANSETRON 4 MG PO TBDP: 4.0000 mg | ORAL_TABLET | Freq: Three times a day (TID) | ORAL | 0 refills | Status: AC | PRN

## 2019-05-01 MED ORDER — FENTANYL CITRATE (PF) 100 MCG/2ML IJ SOLN
50.0000 ug | Freq: Once | INTRAMUSCULAR | Status: AC
  Administered 2019-05-01: 03:00:00 50 ug via INTRAVENOUS
  Filled 2019-05-01: qty 2

## 2019-05-01 MED ORDER — ACETAMINOPHEN 500 MG PO TABS
1000.0000 mg | ORAL_TABLET | Freq: Once | ORAL | Status: AC
  Administered 2019-05-01: 1000 mg via ORAL
  Filled 2019-05-01: qty 2

## 2019-05-01 MED ORDER — DICYCLOMINE HCL 10 MG PO CAPS
10.0000 mg | ORAL_CAPSULE | Freq: Once | ORAL | Status: AC
  Administered 2019-05-01: 10 mg via ORAL
  Filled 2019-05-01: qty 1

## 2019-05-01 NOTE — ED Notes (Signed)
Patient transported to CT 

## 2019-05-01 NOTE — Discharge Instructions (Signed)
You may use Tylenol 1000 mg every 6 hours as needed for pain and fever.  You may use Imodium as needed for diarrhea.  Your labs and CT scan today were normal.  Please continue your antibiotics for pneumonia.

## 2019-05-01 NOTE — ED Provider Notes (Addendum)
TIME SEEN: 2:49 AM  CHIEF COMPLAINT: nausea, vomiting, diarrhea  HPI: Patient is a 78 year old female with history of dementia, chronic kidney disease, hypothyroidism who presents to the emergency department with EMS from a [facility] facility with complaints of diffuse abdominal pain, nausea, vomiting and diarrhea.  Symptoms started yesterday.  No dysuria, fevers, chills, cough, chest pain or shortness of breath.  Was diagnosed with pneumonia yesterday and started on Augmentin and azithromycin.  States she had a negative coronavirus test at her nursing home.  It appears she was just admitted to the hospital on May 26 for ORIF of a left radial shaft fracture.  She has a splint in place.  Patient reports she has had 2 previous C-sections.  It also appears she has had a tubal ligation and hysterectomy.  ROS: See HPI Constitutional: no fever  Eyes: no drainage  ENT: no runny nose   Cardiovascular:  no chest pain  Resp: no SOB  GI: Vomiting and diarrhea GU: no dysuria Integumentary: no rash  Allergy: no hives  Musculoskeletal: no leg swelling  Neurological: no slurred speech ROS otherwise negative  PAST MEDICAL HISTORY/PAST SURGICAL HISTORY:  Past Medical History:  Diagnosis Date  . Abnormal heart rhythm   . Abnormality of gait 12/21/2015  . Afib ([place])    h/o  . Anxiety   . Arthritis    "hips, shoulders" (01/05/2015)  . Back pain   . Chronic kidney disease (CKD), stage III (moderate) ([place])    Archie Endo 01/05/2015  . Dementia ([place])   . Depression   . Echocardiogram abnormal 02/26/11   MVP,mild MR,AOV mildly sclerotic. EF >55%  . GERD (gastroesophageal reflux disease)   . Hypertension   . Hypothyroidism   . Memory difficulty 06/05/2016  . Nausea & vomiting 11/2016  . Osteoporosis   . Osteoporosis   . Reactive airway disease   . S/P right hip fracture 06/2017   No surgery required  . Scoliosis   . Sleep apnea    "suppose to wear a mask; can't tolerate it"  (01/05/2015)    MEDICATIONS:  Prior to Admission medications   Medication Sig Start Date End Date Taking? Authorizing Provider  acetaminophen (TYLENOL) 325 MG tablet Take 650 mg by mouth 2 (two) times a day.     [provider]  albuterol (PROVENTIL) (2.5 MG/3ML) 0.083% nebulizer solution Take 2.5 mg by nebulization See admin instructions. Nebulize 2.5 mg every 4-6 hours as needed for wheezing or shortness of breath    [provider]  Amino Acids-Protein Hydrolys (FEEDING SUPPLEMENT, PRO-STAT SUGAR FREE 64,) LIQD Take 30 mLs by mouth daily. CITRUS FLAVOR    [provider]  anti-nausea (EMETROL) solution Take 30 mLs by mouth every 15 (fifteen) minutes as needed for nausea or vomiting (up to 4 doses in 1 hour and notify MD if no relief).     [provider]  arformoterol (BROVANA) 15 MCG/2ML NEBU Take 2 mLs (15 mcg total) by nebulization 2 (two) times daily. 03/18/18   Deneise Lever, MD  baclofen (LIORESAL) 10 MG tablet Take 10 mg by mouth at bedtime as needed for muscle spasms.     [provider]  budesonide (PULMICORT) 0.5 MG/2ML nebulizer solution Take 2 mLs (0.5 mg total) by nebulization 2 (two) times daily. 03/18/18   Baird Lyons D, MD  budesonide-formoterol (SYMBICORT) 160-4.5 MCG/ACT inhaler Inhale 2 puffs into the lungs 2 (two) times daily. Rinse mouth after use - discard 3 months after opening  [provider]  calcitRIOL (ROCALTROL) 0.25 MCG capsule Take 0.25 mcg by mouth every Monday, Wednesday, and Friday. Do not crush    [provider]  Calcium Carb-Cholecalciferol (OYSTER SHELL CALCIUM 500+D PO) Take 1 tablet by mouth 2 (two) times a day.    [provider]  clonazePAM (KLONOPIN) 2 MG tablet Take 1 tablet (2 mg total) by mouth at bedtime. 04/30/18   Jetty DuhamelYoung, Clinton D, MD  clopidogrel (PLAVIX) 75 MG tablet Take 1 tablet (75 mg total) by mouth daily. 06/05/16   York SpanielWillis, Charles K, MD  diltiazem (CARDIZEM CD) 120 MG  24 hr capsule Take 120 mg by mouth daily. 05/21/17   [provider]  diltiazem (CARDIZEM) 30 MG tablet Take 30 mg by mouth at bedtime as needed (for a pulse rate greater than 110).     [provider]  diphenhydrAMINE (BANOPHEN) 25 mg capsule Take 25 mg by mouth every 4 (four) hours as needed for itching.    [provider]  diphenoxylate-atropine (LOMOTIL) 2.5-0.025 MG tablet Take 1 tablet by mouth every 8 (eight) hours as needed for diarrhea or loose stools.    [provider]  famotidine (PEPCID) 40 MG tablet Take 40 mg by mouth daily.    [provider]  guaifenesin (ROBITUSSIN) 100 MG/5ML syrup Take 200 mg by mouth every 4 (four) hours as needed for cough.    [provider]  lactulose (CHRONULAC) 10 GM/15ML solution Take 20 g by mouth daily as needed for mild constipation.    [provider]  levothyroxine (SYNTHROID, LEVOTHROID) 112 MCG tablet Take 112 mcg by mouth daily.     [provider]  loperamide (IMODIUM A-D) 2 MG capsule Take 2 mg by mouth See admin instructions. Take 2 mg by mouth with each loose stool- up to 8 doses/24 hours as needed for diarrhea and call MD if it persists    [provider]  lubiprostone (AMITIZA) 24 MCG capsule Take 24 mcg by mouth 2 (two) times daily with a meal.    [provider]  memantine (NAMENDA) 10 MG tablet Take 10 mg by mouth 2 (two) times daily.    [provider]  metoprolol tartrate (LOPRESSOR) 25 MG tablet Take 12.5 mg by mouth 2 (two) times daily.     [provider]  NON FORMULARY See admin instructions. "Health Shake": Drink 1 health shake by mouth three times a day with meals    [provider]  Omega-3 Fatty Acids (FISH OIL) 1000 MG CAPS Take 1,000 mg by mouth daily. Do not crush    [provider]  ondansetron (ZOFRAN) 4 MG tablet Take 4 mg by mouth every 8 (eight) hours as needed for nausea or vomiting.    [provider]  PEG 3350-KCl-NaBcb-NaCl-NaSulf (PEG-3350/ELECTROLYTES PO) Take 17 g by mouth daily. TO BE MIXED WITH 8 OUNCES OF FLUID    [provider]  senna (SENOKOT) 8.6 MG tablet Take 2 tablets by mouth daily as needed for constipation.    [provider]  senna-docusate (SENOKOT-S) 8.6-50 MG tablet Take 1 tablet by mouth 2 (two) times daily. Patient taking differently: Take 1 tablet by mouth daily as needed for mild constipation.  05/28/17   Meredeth IdeLama, Gagan S, MD  sertraline (ZOLOFT) 100 MG tablet Take 150 mg by mouth at bedtime.     [provider]  traZODone (DESYREL) 100 MG tablet Take 1 tablet (100 mg total) by mouth at bedtime. 09/30/16  Waymon BudgeYoung, Clinton D, MD    ALLERGIES:  Allergies  Allergen Reactions  . Vicodin [Hydrocodone-Acetaminophen] Nausea And Vomiting    SOCIAL HISTORY:  Social History   Tobacco Use  . Smoking status: Never Smoker  . Smokeless tobacco: Never Used  Substance Use Topics  . Alcohol use: No    FAMILY HISTORY: Family History  Problem Relation Age of Onset  . Cancer Mother   . Heart disease Father   . Cancer Father     EXAM: BP (!) 172/74 (BP Location: Right Arm)   Pulse 76   Temp 98.2 F (36.8 C) (Oral)   Resp 18   SpO2 96%  CONSTITUTIONAL: Alert and oriented and responds appropriately to questions.  Elderly, appears uncomfortable, afebrile, nontoxic HEAD: Normocephalic EYES: Conjunctivae clear, pupils appear equal, EOMI ENT: normal nose; moist mucous membranes NECK: Supple, no meningismus, no nuchal rigidity, no LAD  CARD: RRR; S1 and S2 appreciated; no murmurs, no clicks, no rubs, no gallops RESP: Normal chest excursion without splinting or tachypnea; breath sounds clear and equal bilaterally; no wheezes, no rhonchi, no rales, no hypoxia or respiratory distress, speaking full sentences ABD/GI: Normal bowel sounds; non-distended; soft, diffusely tender to palpation, no rebound, no guarding, no peritoneal signs, no  hepatosplenomegaly BACK:  The back appears normal and is non-tender to palpation, there is no CVA tenderness EXT: Normal ROM in all joints; non-tender to palpation; no edema; normal capillary refill; no cyanosis, no calf tenderness or swelling splint on the left arm    SKIN: Normal color for age and race; warm; no rash NEURO: Moves all extremities equally PSYCH: The patient's mood and manner are appropriate. Grooming and personal hygiene are appropriate.  MEDICAL DECISION MAKING: Patient here with nausea, vomiting and diarrhea.  Recently diagnosed with pneumonia and is on Augmentin and azithromycin.  Reports negative coronavirus screening at her nursing facility.  Differential here includes viral gastroenteritis, colitis, diverticulitis, appendicitis, cholecystitis, pancreatitis, bowel obstruction, UTI.  Will obtain labs, urine and a CT of abdomen pelvis.  Will give IV fluids, pain and nausea medicine.  ED PROGRESS: 4:00 AM  Labs unremarkable.  CT of the abdomen pelvis shows no acute abnormality.  Chest x-ray shows left lower lobe infiltrate for which she is receiving antibiotics.  She has no hypoxia or increased work of breathing here.  Will obtain urinalysis and fluid challenge patient.   6:20 AM  Pt's vital signs remain reassuring.  She has not had any vomiting here.  No coughing, chest pain or shortness of breath.  I have updated patient's daughter who states that she is very concerned about the patient.  She states that she was at [facility] 2 days ago and was diagnosed with pneumonia and dehydration.  She is concerned that the patient may need admission to the hospital.  At this time, I do not feel the patient needs to be admitted.  Her chest x-ray does show possible left lower lobe atelectasis versus infiltrate but this is not even seen on her CT abdomen pelvis.  I feel that continuing the Augmentin and azithromycin at her assisted living facility is appropriate.  She has no increase of  work of breathing or oxygen requirement.  She was tested for coronavirus at [facility] but daughter is not aware of the results.  Daughter seems most concerned that the patient could have COVID-19.  We have discussed that this is a viral illness with no specific treatment other than supportive care and at this time could be  managed at the nursing facility.  She will follow-up on results from [facility].  Daughter seems to be reassured.  We have discussed return precautions.   6:30 AM  Pt's urine shows no infection and no ketones.  Patient tolerating po here without vomiting and no further diarrhea.  I feel she is safe to be discharged back to her nursing facility.  Patient states she is feeling much better.  She agrees that she would like to go back to her nursing facility.   At this time, I do not feel there is any life-threatening condition present. I have reviewed and discussed all results (EKG, imaging, lab, urine as appropriate) and exam findings with patient/family. I have reviewed nursing notes and appropriate previous records.  I feel the patient is safe to be discharged home without further emergent workup and can continue workup as an outpatient as needed. Discussed usual and customary return precautions. Patient/family verbalize understanding and are comfortable with this plan.  Outpatient follow-up has been provided as needed. All questions have been answered.    , Layla MawKristen N, DO 05/01/19 0630    , Layla MawKristen N, DO 05/01/19 [phone]

## 2019-05-01 NOTE — ED Notes (Signed)
Pt depends changed.  IV removed.  Pt able to transfer to wheelchair with staff assist.  Pt wheeled to car and transferred into car with Nursing Home staff assistance.

## 2019-05-01 NOTE — ED Notes (Signed)
Discharge instructions reviewed with RN at [facility]. All questions answered. Facility stated they would send their transporter out to get patient.

## 2019-05-01 NOTE — ED Triage Notes (Signed)
BIB [facility] from [place], per facility pt was seen at [facility] yesterday and dx with PNA. Pt was started on PO abx last night and developed N/V/D shortly after taking them. VSS.

## 2019-05-01 NOTE — ED Notes (Signed)
DaughterJeannene Patella, [phone]

## 2019-05-01 NOTE — ED Notes (Signed)
Attempted in and out, in addition to another RN attempting, 3 times total. Gave pt water and will attempt again

## 2019-05-01 NOTE — ED Notes (Signed)
Gave update to RN at [facility], pt's facility

## 2019-05-01 NOTE — ED Notes (Signed)
Spoke with pt's daughter, [name], and gave update on plan of care. Daughter voiced concern over mother being discharged just yet. MD to call when able and speak with daughter

## 2019-05-01 NOTE — ED Notes (Signed)
Gave pt water 

## 2019-05-01 NOTE — ED Notes (Signed)
Per night shift RN, report was called to [facility] staff.  Awaiting staff to pick up pt.

## 2019-05-12 ENCOUNTER — Telehealth: Payer: Self-pay | Admitting: Internal Medicine

## 2019-05-12 NOTE — Telephone Encounter (Signed)
Called and spoke with pt's daughter [name] is very concerned about her mother due to the nursing staff at the assisted living she is at taking her off of a lot of medications she has been on for years.  [name] stated pt is at [facility] in [place] and has been there now for about three years but recently the way things have been done around there have gone downhill.  [name] stated the staff have taken pt off of her fish oil, her trazodone, her klonopin, her tramadol, and taken one of her daily doses of tylenol away.  [name] is not even sure if any of the other medicines have been either changed or stopped as she is not able to go in there to see pt. [name] said that pt's bed has pretty much been taken away as pt is now laying on a boxspring and mattress which are laying on the floor.  [name] stated that she has also placed a call in to the kidney doctor so they are also aware of meds that pt has been taken off of.  While speaking with [name], she stated that pt is sleeping a lot more, has been falling and even fell around Labor Day, broke her wrist and have to have surgery to have a plate placed. Pt is also not eating that much.  Asked [name] if pt was still wearing her CPAP and she is unsure if they are even having pt wear that. [name] also stated that they have moved a roommate into pt's room which is of concerns due to [place].  [name] is wanting recommendations for pt. Dr.Young, please advise on this. Thank you!

## 2019-05-12 NOTE — Telephone Encounter (Signed)
Called and spoke with pt's daughter stating to her the info from [facility]. [name] expressed  Understanding. Nothing further needed.

## 2019-05-12 NOTE — Telephone Encounter (Signed)
Daughter can check with the facility, but there is probably a staff physician who may check on patient's there once a month. Somebody like that would probably have been the one to order medication changes. Daughter can message or talk with that person.

## 2019-05-13 ENCOUNTER — Encounter (HOSPITAL_COMMUNITY): Payer: Self-pay

## 2019-05-13 ENCOUNTER — Other Ambulatory Visit: Payer: Self-pay

## 2019-05-13 ENCOUNTER — Emergency Department (HOSPITAL_COMMUNITY)
Admission: EM | Admit: 2019-05-13 | Discharge: 2019-05-14 | Disposition: A | Discharge status: Home / Self Care | Payer: Medicare Other | Attending: Emergency Medicine | Admitting: Emergency Medicine

## 2019-05-13 DIAGNOSIS — I129 Hypertensive chronic kidney disease with stage 1 through stage 4 chronic kidney disease, or unspecified chronic kidney disease: Secondary | ICD-10-CM | POA: Diagnosis not present

## 2019-05-13 DIAGNOSIS — R410 Disorientation, unspecified: Secondary | ICD-10-CM | POA: Insufficient documentation

## 2019-05-13 DIAGNOSIS — N183 Chronic kidney disease, stage 3 (moderate): Secondary | ICD-10-CM | POA: Diagnosis not present

## 2019-05-13 DIAGNOSIS — E860 Dehydration: Secondary | ICD-10-CM | POA: Insufficient documentation

## 2019-05-13 DIAGNOSIS — R509 Fever, unspecified: Secondary | ICD-10-CM | POA: Diagnosis present

## 2019-05-13 DIAGNOSIS — E039 Hypothyroidism, unspecified: Secondary | ICD-10-CM | POA: Insufficient documentation

## 2019-05-13 NOTE — ED Triage Notes (Signed)
Ems reports the pt has not urinated in the last three days. Low grade fever. Severe abdominal and low back. Recent surgery on the left arm. Recent diagnosis of pneumonia(last week). Generalized weakness.

## 2019-05-14 ENCOUNTER — Emergency Department (HOSPITAL_COMMUNITY): Payer: Medicare Other

## 2019-05-14 DIAGNOSIS — E860 Dehydration: Secondary | ICD-10-CM | POA: Diagnosis not present

## 2019-05-14 LAB — COMPREHENSIVE METABOLIC PANEL
ALT: 22 U/L (ref 0–44)
AST: 36 U/L (ref 15–41)
Albumin: 2.2 g/dL — ABNORMAL LOW (ref 3.5–5.0)
Alkaline Phosphatase: 76 U/L (ref 38–126)
Anion gap: 13 (ref 5–15)
BUN: 24 mg/dL — ABNORMAL HIGH (ref 8–23)
CO2: 26 mmol/L (ref 22–32)
Calcium: 9.4 mg/dL (ref 8.9–10.3)
Chloride: 98 mmol/L (ref 98–111)
Creatinine, Ser: 1.15 mg/dL — ABNORMAL HIGH (ref 0.44–1.00)
GFR calc Af Amer: 54 mL/min — ABNORMAL LOW (ref >60)
GFR calc non Af Amer: 47 mL/min — ABNORMAL LOW (ref >60)
Glucose, Bld: 100 mg/dL — ABNORMAL HIGH (ref 70–99)
Potassium: 3.2 mmol/L — ABNORMAL LOW (ref 3.5–5.1)
Sodium: 137 mmol/L (ref 135–145)
Total Bilirubin: 1.1 mg/dL (ref 0.3–1.2)
Total Protein: 6.4 g/dL — ABNORMAL LOW (ref 6.5–8.1)

## 2019-05-14 LAB — CBC WITH DIFFERENTIAL/PLATELET
Abs Immature Granulocytes: 0 10*3/uL (ref 0.00–0.07)
Basophils Absolute: 0 10*3/uL (ref 0.0–0.1)
Basophils Relative: 0 %
Eosinophils Absolute: 0 10*3/uL (ref 0.0–0.5)
Eosinophils Relative: 0 %
HCT: 28 % — ABNORMAL LOW (ref 36.0–46.0)
Hemoglobin: 8.9 g/dL — ABNORMAL LOW (ref 12.0–15.0)
Lymphocytes Relative: 4 %
Lymphs Abs: 0.6 10*3/uL — ABNORMAL LOW (ref 0.7–4.0)
MCH: 30.1 pg (ref 26.0–34.0)
MCHC: 31.8 g/dL (ref 30.0–36.0)
MCV: 94.6 fL (ref 80.0–100.0)
Monocytes Absolute: 1.8 10*3/uL — ABNORMAL HIGH (ref 0.1–1.0)
Monocytes Relative: 11 %
Neutro Abs: 13.6 10*3/uL — ABNORMAL HIGH (ref 1.7–7.7)
Neutrophils Relative %: 85 %
Platelets: 242 10*3/uL (ref 150–400)
RBC: 2.96 MIL/uL — ABNORMAL LOW (ref 3.87–5.11)
RDW: 14 % (ref 11.5–15.5)
WBC: 16 10*3/uL — ABNORMAL HIGH (ref 4.0–10.5)
nRBC: 0 % (ref 0.0–0.2)
nRBC: 1 /100 WBC — ABNORMAL HIGH

## 2019-05-14 LAB — URINALYSIS, ROUTINE W REFLEX MICROSCOPIC
Bilirubin Urine: NEGATIVE
Glucose, UA: NEGATIVE mg/dL
Hgb urine dipstick: NEGATIVE
Ketones, ur: 5 mg/dL — AB
Leukocytes,Ua: NEGATIVE
Nitrite: NEGATIVE
Protein, ur: NEGATIVE mg/dL
Specific Gravity, Urine: 1.013 (ref 1.005–1.030)
pH: 6 (ref 5.0–8.0)

## 2019-05-14 MED ORDER — LACTATED RINGERS IV BOLUS
1000.0000 mL | Freq: Once | INTRAVENOUS | Status: AC
  Administered 2019-05-14: 1000 mL via INTRAVENOUS

## 2019-05-14 NOTE — ED Notes (Signed)
Pt denies any pain but also believes this RN is her husband.

## 2019-05-14 NOTE — ED Notes (Signed)
Called [facility] for transport to [facility]

## 2019-05-14 NOTE — ED Notes (Addendum)
In and out attempted x2. No urine returned. Signs of obvious yeast infection.

## 2019-05-14 NOTE — ED Notes (Signed)
Bladder scan volume 33

## 2019-05-14 NOTE — ED Notes (Signed)
Bladder scan volume 81ml,

## 2019-05-14 NOTE — ED Notes (Signed)
Patient's daughter verbalizes understanding of discharge instructions via telephone. Opportunity for questioning and answers were provided. Armband removed by staff, pt discharged from ED back to her facility.

## 2019-05-14 NOTE — ED Notes (Signed)
Patient transported to CT 

## 2019-05-14 NOTE — ED Provider Notes (Signed)
Emergency Department Provider Note   I have reviewed the triage vital signs and the nursing notes.   HISTORY  Chief Complaint Urinary Retention and Fever   HPI [name] is a 78 y.o. female who presents the emergency department today with multiple symptoms.  History is obtained from patient, daughter and I written note from the nursing facility.  Apparently patient had decreased appetite for last few days and then also has had decreased urine output during the same time..  She is been more confused than her baseline level of dementia.  Some question about having some urinary symptoms but that is less clear.  Patient had a fall on Sunday has had some lumbar back pain since that time.  She has no new lower extremity weakness just generalized weakness.  He was max temperature at the facility was 99.6.  Recently had a ORIF done of her left forearm. No cough, vomiting, diarrhea, constipation.    No other associated or modifying symptoms.    Past Medical History:  Diagnosis Date  . Abnormal heart rhythm   . Abnormality of gait 12/21/2015  . Afib (HCC)    h/o  . Anxiety   . Arthritis    "hips, shoulders" (01/05/2015)  . Back pain   . Chronic kidney disease (CKD), stage III (moderate) (HCC)    [name]/notes 01/05/2015  . Dementia (HCC)   . Depression   . Echocardiogram abnormal 02/26/11   MVP,mild MR,AOV mildly sclerotic. EF >55%  . GERD (gastroesophageal reflux disease)   . Hypertension   . Hypothyroidism   . Memory difficulty 06/05/2016  . Nausea & vomiting 11/2016  . Osteoporosis   . Osteoporosis   . Reactive airway disease   . S/P right hip fracture 06/2017   No surgery required  . Scoliosis   . Sleep apnea    "suppose to wear a mask; can't tolerate it" (01/05/2015)    Patient Active Problem List   Diagnosis Date Noted  . Closed displaced transverse fracture of shaft of left radius 04/13/2019  . Femur fracture, right (HCC) 05/26/2017  . Hypokalemia 05/26/2017  . Severely  underweight adult 01/02/2017  . Diarrhea   . Colitis   . Dehydration 11/18/2016  . AKI (acute kidney injury) (HCC) 11/18/2016  . Abdominal pain   . Intractable vomiting with nausea   . Renal insufficiency   . Insomnia 09/30/2016  . Memory difficulty 06/05/2016  . Abnormality of gait 12/21/2015  . Cytopenia 02/09/2015  . Protein-calorie malnutrition, severe (HCC) 01/07/2015  . Vomiting and diarrhea 01/05/2015  . Sleep walking 10/08/2014  . Restless legs 10/08/2014  . Essential hypertension 09/16/2014  . Chronic kidney disease, stage 3 (HCC) 09/16/2014  . Paroxysmal atrial tachycardia (HCC) 09/16/2014  . Hypothyroidism 09/16/2014  . Obstructive sleep apnea 07/18/2011    Past Surgical History:  Procedure Laterality Date  . CATARACT EXTRACTION, BILATERAL Bilateral   . CESAREAN SECTION  1967  . CESAREAN SECTION  1971  . DILATION AND CURETTAGE OF UTERUS    . FLEXIBLE SIGMOIDOSCOPY N/A 11/24/2016   Procedure: FLEXIBLE SIGMOIDOSCOPY;  Surgeon: Iva Booparl E Gessner, MD;  [facility];  Service: Endoscopy;  Laterality: N/A;  . JOINT REPLACEMENT    . OPEN REDUCTION INTERNAL FIXATION (ORIF) DISTAL RADIAL FRACTURE Left 12/2009   [name]/notes 12/28/2009  . OPEN REDUCTION INTERNAL FIXATION (ORIF) DISTAL RADIAL FRACTURE Left 04/13/2019   Procedure: OPEN REDUCTION INTERNAL FIXATION (ORIF) LEFT RADIAL SHAFT FRACTURE WITH HARDWARE REMOVAL;  Surgeon: Sheral ApleyMurphy, Timothy D, MD;  Location: [facility];  Service: Orthopedics;  Laterality: Left;  with experal block  . TOTAL KNEE ARTHROPLASTY Left 03/2010   [name]/notes 04/07/2010  . TUBAL LIGATION    . VAGINAL HYSTERECTOMY  1980    Current Outpatient Rx  . Order #: [id number] Class: Historical Med  . Order #: [id number] Class: Historical Med  . Order #: [id number] Class: Historical Med  . Order #: [id number] Class: Historical Med  . Order #: [id number] Class: Historical Med  . Order #: [id number] Class: Historical Med  . Order #: [id number] Class: Historical Med  . Order #:  [id number] Class: Historical Med  . Order #: [id number] Class: Historical Med  . Order #: [id number] Class: Normal  . Order #: [id number] Class: Historical Med  . Order #: [id number] Class: Historical Med  . Order #: [id number] Class: Historical Med  . Order #: [id number] Class: Historical Med  . Order #: [id number] Class: Historical Med  . Order #: [id number] Class: Historical Med  . Order #: [id number] Class: Historical Med  . Order #: [id number] Class: Historical Med  . Order #: [id number] Class: Historical Med  . Order #: [id number] Class: Historical Med  . Order #: [id number] Class: Historical Med  . Order #: [id number] Class: Historical Med  . Order #: [id number] Class: Print  . Order #: [id number] Class: Historical Med  . Order #: [id number] Class: Historical Med  . Order #: [id number] Class: Print  . Order #: [id number] Class: Historical Med    Allergies Vicodin [hydrocodone-acetaminophen]  Family History  Problem Relation Age of Onset  . Cancer Mother   . Heart disease Father   . Cancer Father     Social History Social History   Tobacco Use  . Smoking status: Never Smoker  . Smokeless tobacco: Never Used  Substance Use Topics  . Alcohol use: No  . Drug use: No    Review of Systems  All other systems negative except as documented in the HPI. All pertinent positives and negatives as reviewed in the HPI. ____________________________________________   PHYSICAL EXAM:  VITAL SIGNS: Vitals:   05/14/19 0400 05/14/19 0430 05/14/19 0500 05/14/19 0515  BP: (!) 179/81 (!) 181/85 (!) 180/74 (!) 160/65  Pulse: 70 (!) 109 (!) 101 (!) 101  Resp: 20 (!) 22 (!) 25 19  Temp:      TempSrc:      SpO2: 95% 94% 94% 98%     Constitutional: Alert and oriented to self and place only, not time or situation. Well appearing and in no acute distress. Eyes: Conjunctivae are normal. PERRL. EOMI. Head: Atraumatic. Nose: No congestion/rhinnorhea. Mouth/Throat: Mucous membranes are moist.  Oropharynx non-erythematous.  Neck: No stridor.  No meningeal signs.   Cardiovascular: Normal rate, regular rhythm. Good peripheral circulation. Grossly normal heart sounds.   Respiratory: Normal respiratory effort.  No retractions. Lungs CTAB. Gastrointestinal: Soft and nontender. No distention.  Musculoskeletal: No lower extremity tenderness nor edema. No gross deformities of extremities. ttp in lumbar area without obvious stepoffs or deformities. Neurologic:  Normal speech and language. No gross focal neurologic deficits are appreciated.  Skin:  Skin is warm, dry and intact. No rash noted.  ____________________________________________   LABS (all labs ordered are listed, but only abnormal results are displayed)  Labs Reviewed  CBC WITH DIFFERENTIAL/PLATELET - Abnormal; Notable for the following components:      Result Value   WBC 16.0 (*)    RBC 2.96 (*)    Hemoglobin 8.9 (*)    HCT 28.0 (*)    Neutro Abs 13.6 (*)    Lymphs Abs 0.6 (*)    Monocytes Absolute 1.8 (*)  nRBC 1 (*)    All other components within normal limits  COMPREHENSIVE METABOLIC PANEL - Abnormal; Notable for the following components:   Potassium 3.2 (*)    Glucose, Bld 100 (*)    BUN 24 (*)    Creatinine, Ser 1.15 (*)    Total Protein 6.4 (*)    Albumin 2.2 (*)    GFR calc non Af Amer 47 (*)    GFR calc Af Amer 54 (*)    All other components within normal limits  URINALYSIS, ROUTINE W REFLEX MICROSCOPIC - Abnormal; Notable for the following components:   Ketones, ur 5 (*)    All other components within normal limits  URINE CULTURE  URINE CULTURE   ____________________________________________  EKG   EKG Interpretation  Date/Time:  Friday May 14 2019 00:38:52 EDT Ventricular Rate:  108 PR Interval:    QRS Duration: 82 QT Interval:  351 QTC Calculation: 471 R Axis:   68 Text Interpretation:  Sinus tachycardia with irregular rate Nonspecific T abnormalities, lateral leads No significant change since last tracing Confirmed  by Merrily Pew [phone]) on 05/14/2019 1:22:01 AM       ____________________________________________  RADIOLOGY  Dg Chest 2 View  Result Date: 05/14/2019 CLINICAL DATA:  Fall EXAM: CHEST - 2 VIEW COMPARISON:  05/01/2019 FINDINGS: Marked levoscoliosis of the thoracic spine. Mild left basilar atelectasis. Lungs are otherwise clear. Cardiomediastinal contours are unchanged. IMPRESSION: No active cardiopulmonary disease. Electronically Signed   By: Ulyses Jarred M.D.   On: 05/14/2019 02:25   Dg Lumbar Spine Complete  Result Date: 05/14/2019 CLINICAL DATA:  Fall EXAM: LUMBAR SPINE - COMPLETE 4+ VIEW COMPARISON:  None. FINDINGS: There is right convex lumbar scoliosis. No acute fracture is visible. Multilevel moderate facet arthrosis. IMPRESSION: Marked right convex scoliosis without acute fracture. Electronically Signed   By: Ulyses Jarred M.D.   On: 05/14/2019 02:25   Ct Head Wo Contrast  Result Date: 05/14/2019 CLINICAL DATA:  Head trauma EXAM: CT HEAD WITHOUT CONTRAST TECHNIQUE: Contiguous axial images were obtained from the base of the skull through the vertex without intravenous contrast. COMPARISON:  Head CT 03/23/2019 FINDINGS: Brain: There is no mass, hemorrhage or extra-axial collection. There is generalized atrophy without lobar predilection. Hypodensity of the white matter is most commonly associated with chronic microvascular disease. Vascular: Atherosclerotic calcification of the internal carotid arteries at the skull base. No abnormal hyperdensity of the major intracranial arteries or dural venous sinuses. Skull: The visualized skull base, calvarium and extracranial soft tissues are normal. Sinuses/Orbits: No fluid levels or advanced mucosal thickening of the visualized paranasal sinuses. No mastoid or middle ear effusion. The orbits are normal. IMPRESSION: Severe chronic white matter disease, likely due to chronic ischemic microangiopathy. No acute intracranial abnormality. Electronically  Signed   By: Ulyses Jarred M.D.   On: 05/14/2019 02:12    ____________________________________________   PROCEDURES  Procedure(s) performed:   Procedures   ____________________________________________   INITIAL IMPRESSION / ASSESSMENT AND PLAN / ED COURSE  There is a volume of 33 the patient states last time she urinated was at 7 AM.  Suspect he is pretty significantly dehydrated.  Will fluid hydrate.  Also pending urinalysis but it could just be the dehydration is caused her to act more confused.  Plan for reevaluation after fluids and results of labs and x-rays.  Workup unremarkable and persistently afebrile. Hypertensive, but doubt PRES. Patient still at persistent mental status. Worked up appropriately.   Pertinent labs & imaging results that  were available during my care of the patient were reviewed by me and considered in my medical decision making (see chart for details).  A medical screening exam was performed and I feel the patient has had an appropriate workup for their chief complaint at this time and likelihood of emergent condition existing is low. They have been counseled on decision, discharge, follow up and which symptoms necessitate immediate return to the emergency department. They or their family verbally stated understanding and agreement with plan and discharged in stable condition.   ____________________________________________  FINAL CLINICAL IMPRESSION(S) / ED DIAGNOSES  Final diagnoses:  Dehydration    MEDICATIONS GIVEN DURING THIS VISIT:  Medications  lactated ringers bolus 1,000 mL (0 mLs Intravenous Stopped 05/14/19 0333)  lactated ringers bolus 1,000 mL (0 mLs Intravenous Stopped 05/14/19 0523)    NEW OUTPATIENT MEDICATIONS STARTED DURING THIS VISIT:  Current Discharge Medication List      Note:  This note was prepared with assistance of Dragon voice recognition software. Occasional wrong-word or sound-a-like substitutions may have occurred due  to the inherent limitations of voice recognition software.   Musab Wingard, Barbara CowerJason, MD 05/14/19 0600

## 2019-05-15 LAB — URINE CULTURE: Culture: NO GROWTH

## 2019-05-19 DIAGNOSIS — I959 Hypotension, unspecified: Secondary | ICD-10-CM | POA: Diagnosis not present

## 2019-05-19 DIAGNOSIS — A419 Sepsis, unspecified organism: Secondary | ICD-10-CM

## 2019-05-19 DIAGNOSIS — J449 Chronic obstructive pulmonary disease, unspecified: Secondary | ICD-10-CM

## 2019-05-19 DIAGNOSIS — N390 Urinary tract infection, site not specified: Secondary | ICD-10-CM | POA: Diagnosis not present

## 2019-05-19 DIAGNOSIS — R4182 Altered mental status, unspecified: Secondary | ICD-10-CM | POA: Diagnosis not present

## 2019-05-19 DIAGNOSIS — E876 Hypokalemia: Secondary | ICD-10-CM

## 2019-05-20 DIAGNOSIS — A419 Sepsis, unspecified organism: Secondary | ICD-10-CM | POA: Diagnosis not present

## 2019-05-20 DIAGNOSIS — R4182 Altered mental status, unspecified: Secondary | ICD-10-CM | POA: Diagnosis not present

## 2019-05-20 DIAGNOSIS — I959 Hypotension, unspecified: Secondary | ICD-10-CM | POA: Diagnosis not present

## 2019-05-20 DIAGNOSIS — N390 Urinary tract infection, site not specified: Secondary | ICD-10-CM | POA: Diagnosis not present

## 2019-05-21 DIAGNOSIS — A419 Sepsis, unspecified organism: Secondary | ICD-10-CM | POA: Diagnosis not present

## 2019-05-21 DIAGNOSIS — R4182 Altered mental status, unspecified: Secondary | ICD-10-CM | POA: Diagnosis not present

## 2019-05-21 DIAGNOSIS — N390 Urinary tract infection, site not specified: Secondary | ICD-10-CM | POA: Diagnosis not present

## 2019-05-21 DIAGNOSIS — I959 Hypotension, unspecified: Secondary | ICD-10-CM | POA: Diagnosis not present

## 2019-05-22 DIAGNOSIS — I471 Supraventricular tachycardia: Secondary | ICD-10-CM | POA: Diagnosis not present

## 2019-05-22 DIAGNOSIS — N390 Urinary tract infection, site not specified: Secondary | ICD-10-CM | POA: Diagnosis not present

## 2019-05-22 DIAGNOSIS — A419 Sepsis, unspecified organism: Secondary | ICD-10-CM | POA: Diagnosis not present

## 2019-05-22 DIAGNOSIS — I959 Hypotension, unspecified: Secondary | ICD-10-CM | POA: Diagnosis not present

## 2019-05-22 DIAGNOSIS — R4182 Altered mental status, unspecified: Secondary | ICD-10-CM | POA: Diagnosis not present

## 2019-05-23 DIAGNOSIS — I959 Hypotension, unspecified: Secondary | ICD-10-CM | POA: Diagnosis not present

## 2019-05-23 DIAGNOSIS — R4182 Altered mental status, unspecified: Secondary | ICD-10-CM | POA: Diagnosis not present

## 2019-05-23 DIAGNOSIS — A419 Sepsis, unspecified organism: Secondary | ICD-10-CM | POA: Diagnosis not present

## 2019-05-23 DIAGNOSIS — N390 Urinary tract infection, site not specified: Secondary | ICD-10-CM | POA: Diagnosis not present

## 2019-05-23 DIAGNOSIS — I361 Nonrheumatic tricuspid (valve) insufficiency: Secondary | ICD-10-CM | POA: Diagnosis not present

## 2019-05-24 DIAGNOSIS — A419 Sepsis, unspecified organism: Secondary | ICD-10-CM | POA: Diagnosis not present

## 2019-05-24 DIAGNOSIS — R4182 Altered mental status, unspecified: Secondary | ICD-10-CM | POA: Diagnosis not present

## 2019-05-24 DIAGNOSIS — I959 Hypotension, unspecified: Secondary | ICD-10-CM | POA: Diagnosis not present

## 2019-05-24 DIAGNOSIS — N390 Urinary tract infection, site not specified: Secondary | ICD-10-CM | POA: Diagnosis not present

## 2019-05-25 DIAGNOSIS — I471 Supraventricular tachycardia: Secondary | ICD-10-CM

## 2019-05-25 DIAGNOSIS — A419 Sepsis, unspecified organism: Secondary | ICD-10-CM | POA: Diagnosis not present

## 2019-05-25 DIAGNOSIS — N390 Urinary tract infection, site not specified: Secondary | ICD-10-CM | POA: Diagnosis not present

## 2019-05-25 DIAGNOSIS — I959 Hypotension, unspecified: Secondary | ICD-10-CM | POA: Diagnosis not present

## 2019-05-25 DIAGNOSIS — R4182 Altered mental status, unspecified: Secondary | ICD-10-CM | POA: Diagnosis not present

## 2019-05-26 DIAGNOSIS — R4182 Altered mental status, unspecified: Secondary | ICD-10-CM | POA: Diagnosis not present

## 2019-05-26 DIAGNOSIS — E876 Hypokalemia: Secondary | ICD-10-CM | POA: Diagnosis not present

## 2019-05-26 DIAGNOSIS — N179 Acute kidney failure, unspecified: Secondary | ICD-10-CM

## 2019-05-26 DIAGNOSIS — I4891 Unspecified atrial fibrillation: Secondary | ICD-10-CM

## 2019-05-27 DIAGNOSIS — I4891 Unspecified atrial fibrillation: Secondary | ICD-10-CM | POA: Diagnosis not present

## 2019-05-27 DIAGNOSIS — E876 Hypokalemia: Secondary | ICD-10-CM | POA: Diagnosis not present

## 2019-05-27 DIAGNOSIS — R4182 Altered mental status, unspecified: Secondary | ICD-10-CM | POA: Diagnosis not present

## 2019-05-27 DIAGNOSIS — N179 Acute kidney failure, unspecified: Secondary | ICD-10-CM | POA: Diagnosis not present

## 2019-05-28 DIAGNOSIS — R4182 Altered mental status, unspecified: Secondary | ICD-10-CM | POA: Diagnosis not present

## 2019-05-28 DIAGNOSIS — I4891 Unspecified atrial fibrillation: Secondary | ICD-10-CM | POA: Diagnosis not present

## 2019-05-28 DIAGNOSIS — I471 Supraventricular tachycardia: Secondary | ICD-10-CM | POA: Diagnosis not present

## 2019-05-28 DIAGNOSIS — E876 Hypokalemia: Secondary | ICD-10-CM | POA: Diagnosis not present

## 2019-05-28 DIAGNOSIS — N179 Acute kidney failure, unspecified: Secondary | ICD-10-CM | POA: Diagnosis not present

## 2019-05-29 DIAGNOSIS — E876 Hypokalemia: Secondary | ICD-10-CM | POA: Diagnosis not present

## 2019-05-29 DIAGNOSIS — I4891 Unspecified atrial fibrillation: Secondary | ICD-10-CM | POA: Diagnosis not present

## 2019-05-29 DIAGNOSIS — R4182 Altered mental status, unspecified: Secondary | ICD-10-CM | POA: Diagnosis not present

## 2019-05-29 DIAGNOSIS — N179 Acute kidney failure, unspecified: Secondary | ICD-10-CM | POA: Diagnosis not present

## 2019-05-30 DIAGNOSIS — E876 Hypokalemia: Secondary | ICD-10-CM | POA: Diagnosis not present

## 2019-05-30 DIAGNOSIS — I4891 Unspecified atrial fibrillation: Secondary | ICD-10-CM | POA: Diagnosis not present

## 2019-05-30 DIAGNOSIS — R4182 Altered mental status, unspecified: Secondary | ICD-10-CM | POA: Diagnosis not present

## 2019-05-30 DIAGNOSIS — N179 Acute kidney failure, unspecified: Secondary | ICD-10-CM | POA: Diagnosis not present

## 2019-05-31 DIAGNOSIS — J449 Chronic obstructive pulmonary disease, unspecified: Secondary | ICD-10-CM | POA: Diagnosis not present

## 2019-05-31 DIAGNOSIS — R4182 Altered mental status, unspecified: Secondary | ICD-10-CM | POA: Diagnosis not present

## 2019-05-31 DIAGNOSIS — E876 Hypokalemia: Secondary | ICD-10-CM | POA: Diagnosis not present

## 2019-05-31 DIAGNOSIS — I119 Hypertensive heart disease without heart failure: Secondary | ICD-10-CM | POA: Diagnosis not present

## 2019-05-31 DIAGNOSIS — I4891 Unspecified atrial fibrillation: Secondary | ICD-10-CM | POA: Diagnosis not present

## 2019-05-31 DIAGNOSIS — I480 Paroxysmal atrial fibrillation: Secondary | ICD-10-CM | POA: Diagnosis not present

## 2019-05-31 DIAGNOSIS — N183 Chronic kidney disease, stage 3 (moderate): Secondary | ICD-10-CM | POA: Diagnosis not present

## 2019-05-31 DIAGNOSIS — N179 Acute kidney failure, unspecified: Secondary | ICD-10-CM | POA: Diagnosis not present

## 2019-06-01 DIAGNOSIS — E876 Hypokalemia: Secondary | ICD-10-CM | POA: Diagnosis not present

## 2019-06-01 DIAGNOSIS — I4891 Unspecified atrial fibrillation: Secondary | ICD-10-CM | POA: Diagnosis not present

## 2019-06-01 DIAGNOSIS — I480 Paroxysmal atrial fibrillation: Secondary | ICD-10-CM | POA: Diagnosis not present

## 2019-06-01 DIAGNOSIS — N179 Acute kidney failure, unspecified: Secondary | ICD-10-CM | POA: Diagnosis not present

## 2019-06-01 DIAGNOSIS — R4182 Altered mental status, unspecified: Secondary | ICD-10-CM | POA: Diagnosis not present

## 2019-06-01 DIAGNOSIS — J449 Chronic obstructive pulmonary disease, unspecified: Secondary | ICD-10-CM | POA: Diagnosis not present

## 2019-06-01 DIAGNOSIS — I119 Hypertensive heart disease without heart failure: Secondary | ICD-10-CM | POA: Diagnosis not present

## 2019-06-01 DIAGNOSIS — N183 Chronic kidney disease, stage 3 (moderate): Secondary | ICD-10-CM | POA: Diagnosis not present

## 2019-06-02 DIAGNOSIS — R4182 Altered mental status, unspecified: Secondary | ICD-10-CM | POA: Diagnosis not present

## 2019-06-02 DIAGNOSIS — J449 Chronic obstructive pulmonary disease, unspecified: Secondary | ICD-10-CM | POA: Diagnosis not present

## 2019-06-02 DIAGNOSIS — I480 Paroxysmal atrial fibrillation: Secondary | ICD-10-CM | POA: Diagnosis not present

## 2019-06-02 DIAGNOSIS — E876 Hypokalemia: Secondary | ICD-10-CM | POA: Diagnosis not present

## 2019-06-02 DIAGNOSIS — I4891 Unspecified atrial fibrillation: Secondary | ICD-10-CM | POA: Diagnosis not present

## 2019-06-02 DIAGNOSIS — N179 Acute kidney failure, unspecified: Secondary | ICD-10-CM | POA: Diagnosis not present

## 2019-06-02 DIAGNOSIS — I119 Hypertensive heart disease without heart failure: Secondary | ICD-10-CM | POA: Diagnosis not present

## 2019-06-02 DIAGNOSIS — N183 Chronic kidney disease, stage 3 (moderate): Secondary | ICD-10-CM | POA: Diagnosis not present

## 2019-06-03 DIAGNOSIS — N183 Chronic kidney disease, stage 3 (moderate): Secondary | ICD-10-CM | POA: Diagnosis not present

## 2019-06-03 DIAGNOSIS — R4182 Altered mental status, unspecified: Secondary | ICD-10-CM | POA: Diagnosis not present

## 2019-06-03 DIAGNOSIS — J449 Chronic obstructive pulmonary disease, unspecified: Secondary | ICD-10-CM

## 2019-06-03 DIAGNOSIS — I480 Paroxysmal atrial fibrillation: Secondary | ICD-10-CM

## 2019-06-03 DIAGNOSIS — I4891 Unspecified atrial fibrillation: Secondary | ICD-10-CM | POA: Diagnosis not present

## 2019-06-03 DIAGNOSIS — N179 Acute kidney failure, unspecified: Secondary | ICD-10-CM | POA: Diagnosis not present

## 2019-06-03 DIAGNOSIS — E876 Hypokalemia: Secondary | ICD-10-CM | POA: Diagnosis not present

## 2019-06-03 DIAGNOSIS — I119 Hypertensive heart disease without heart failure: Secondary | ICD-10-CM | POA: Diagnosis not present

## 2019-06-04 DIAGNOSIS — I4891 Unspecified atrial fibrillation: Secondary | ICD-10-CM | POA: Diagnosis not present

## 2019-06-04 DIAGNOSIS — R4182 Altered mental status, unspecified: Secondary | ICD-10-CM | POA: Diagnosis not present

## 2019-06-04 DIAGNOSIS — N183 Chronic kidney disease, stage 3 (moderate): Secondary | ICD-10-CM | POA: Diagnosis not present

## 2019-06-04 DIAGNOSIS — J449 Chronic obstructive pulmonary disease, unspecified: Secondary | ICD-10-CM | POA: Diagnosis not present

## 2019-06-04 DIAGNOSIS — I480 Paroxysmal atrial fibrillation: Secondary | ICD-10-CM | POA: Diagnosis not present

## 2019-06-04 DIAGNOSIS — E876 Hypokalemia: Secondary | ICD-10-CM | POA: Diagnosis not present

## 2019-06-04 DIAGNOSIS — I119 Hypertensive heart disease without heart failure: Secondary | ICD-10-CM | POA: Diagnosis not present

## 2019-06-04 DIAGNOSIS — N179 Acute kidney failure, unspecified: Secondary | ICD-10-CM | POA: Diagnosis not present

## 2019-06-19 DEATH — deceased

## 2020-02-19 IMAGING — CT CT ABDOMEN AND PELVIS WITH CONTRAST
2 of 5 series · 16 of 46 positions shown, 18 images · IV contrast (Omni 300)
Comparison: 11/18/2016 CT of the pelvis

CLINICAL DATA: Pneumonia.  Nausea and vomiting.

EXAM:
CT ABDOMEN AND PELVIS WITH CONTRAST
TECHNIQUE: Multidetector CT imaging of the abdomen and pelvis was performed
using the standard protocol following bolus administration of
intravenous contrast.
CONTRAST:  100mL OMNIPAQUE IOHEXOL 300 MG/ML  SOLN

[Series 3: a/p w/ 5mm · axial · 0.75mm/px · z∈[+765,+1170]mm · 13 of 91 slices shown, 15 images]
[im 5/91  soft-tissue]
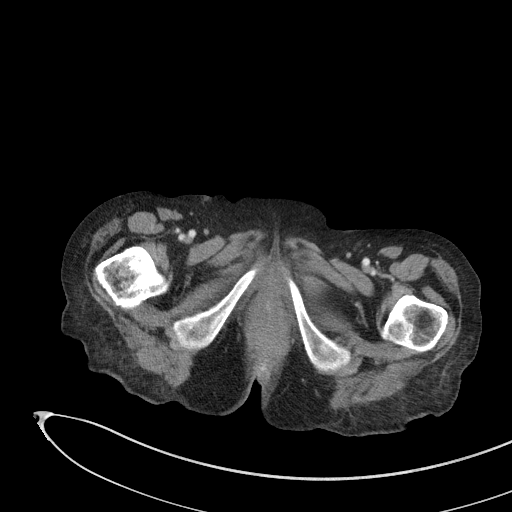
[im 5/91  bone]
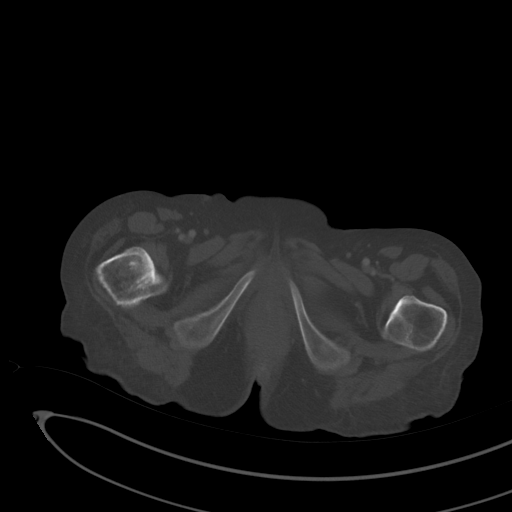
[im 14/91  soft-tissue]
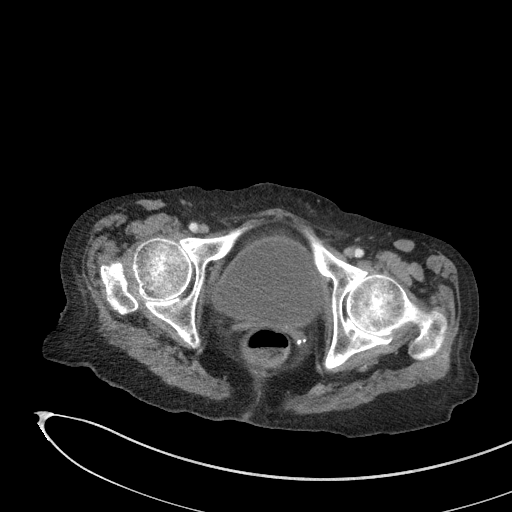
[im 19/91  soft-tissue]
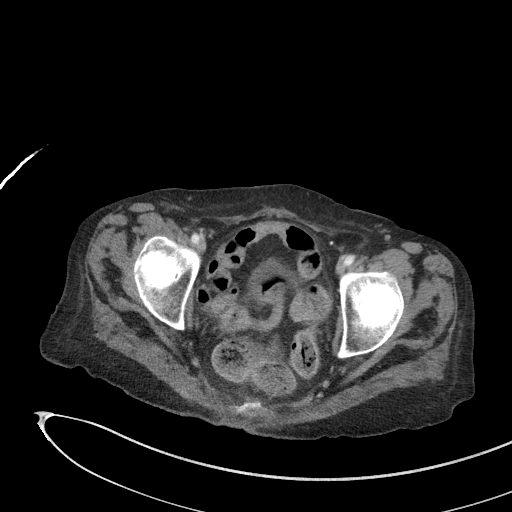
[im 28/91  soft-tissue]
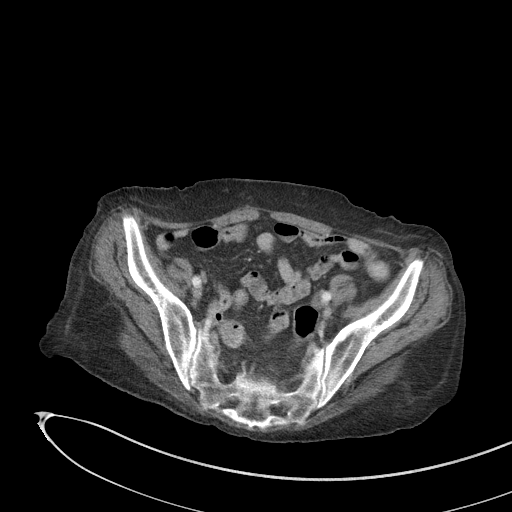
[im 32/91  soft-tissue]
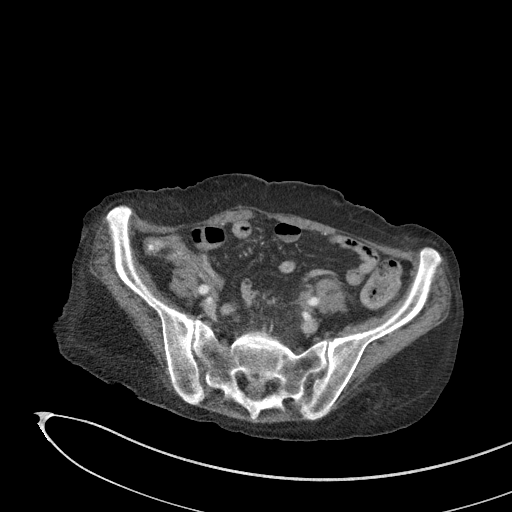
[im 41/91  soft-tissue]
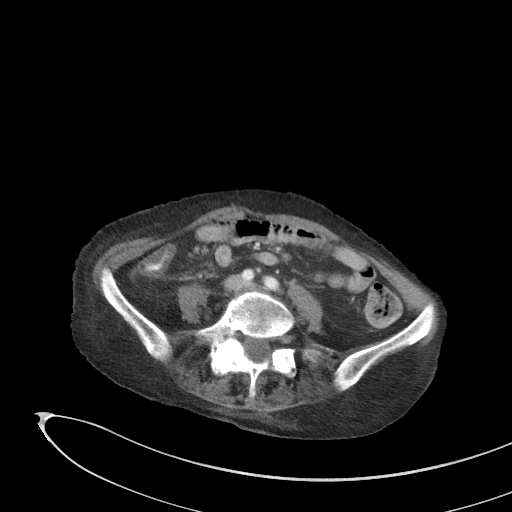
[im 46/91  soft-tissue]
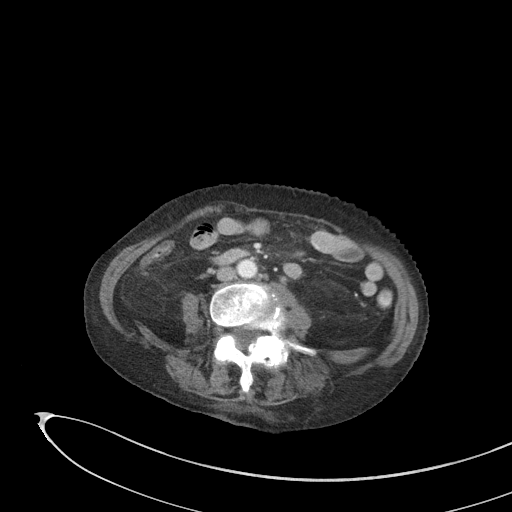
[im 50/91  soft-tissue]
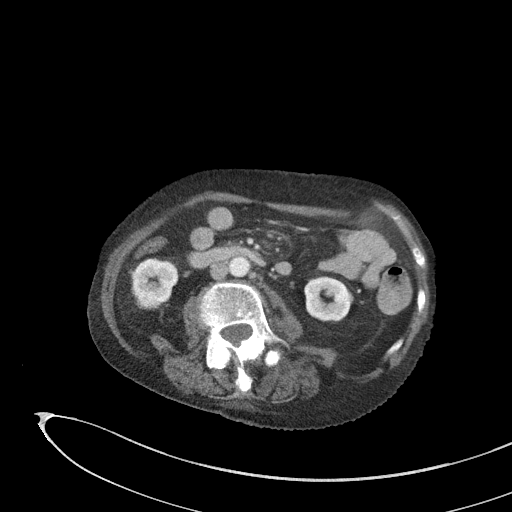
[im 59/91  soft-tissue]
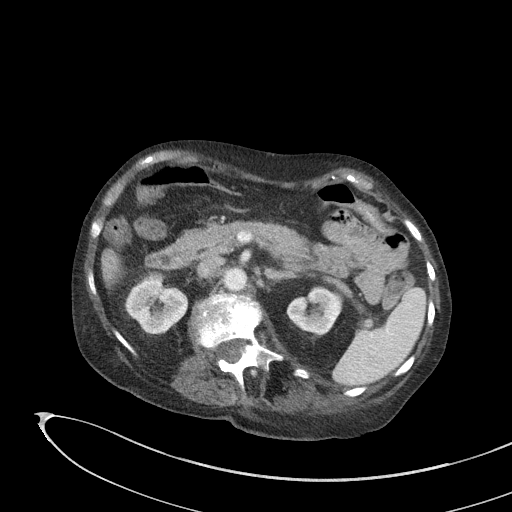
[im 59/91  bone]
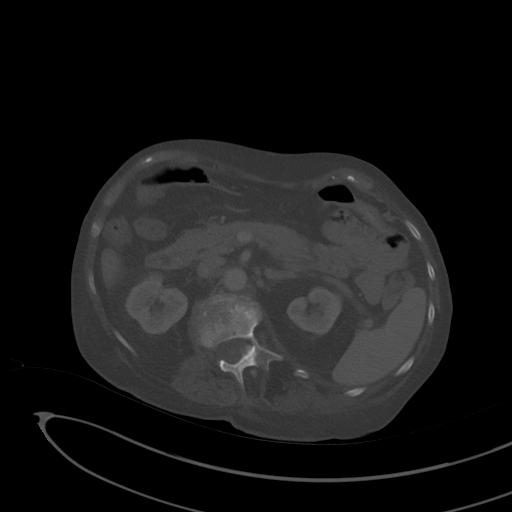
[im 64/91  soft-tissue]
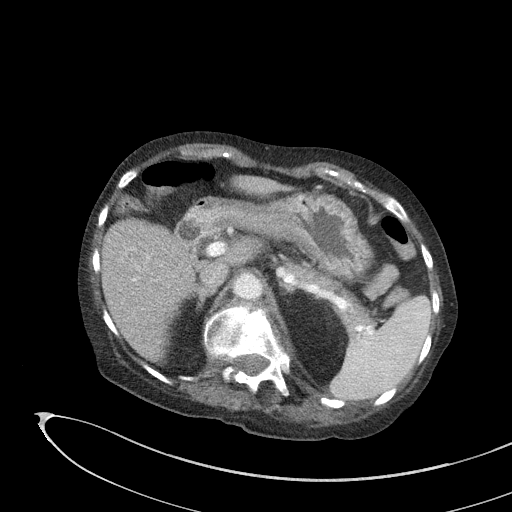
[im 73/91  soft-tissue]
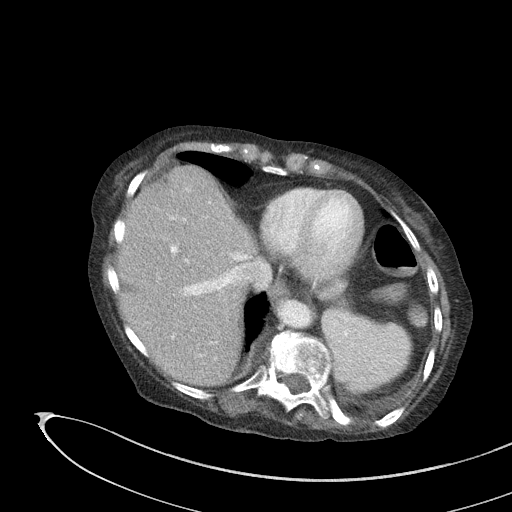
[im 77/91  soft-tissue]
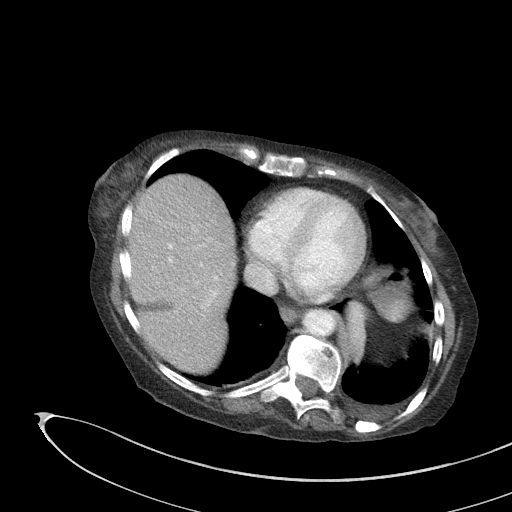
[im 86/91  soft-tissue]
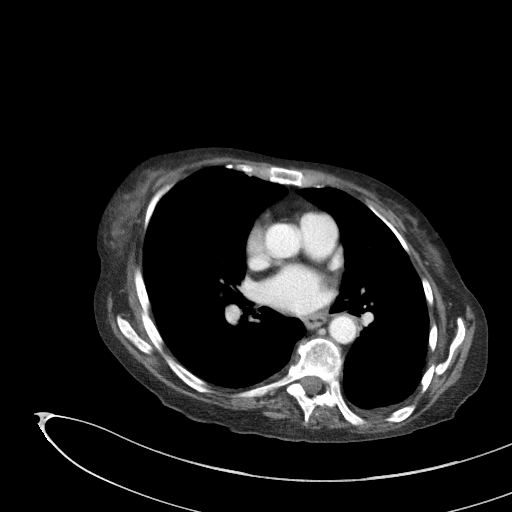

[Series 6: a/p w/ cor · coronal · 0.69mm/px · 3 of 123 slices shown]
[im 41/123  soft-tissue]
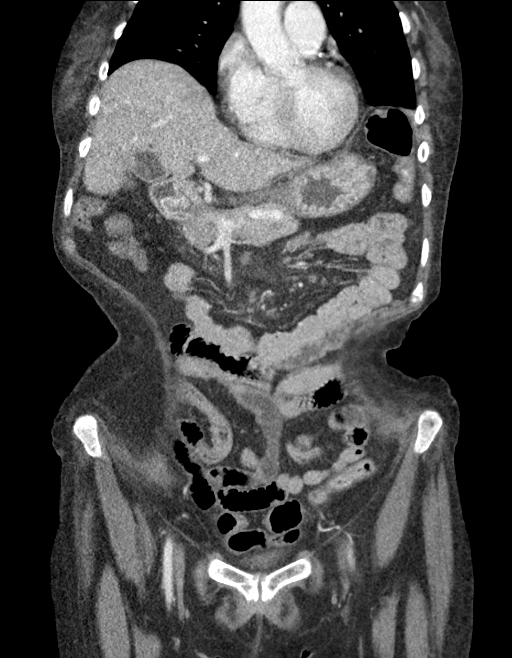
[im 55/123  soft-tissue]
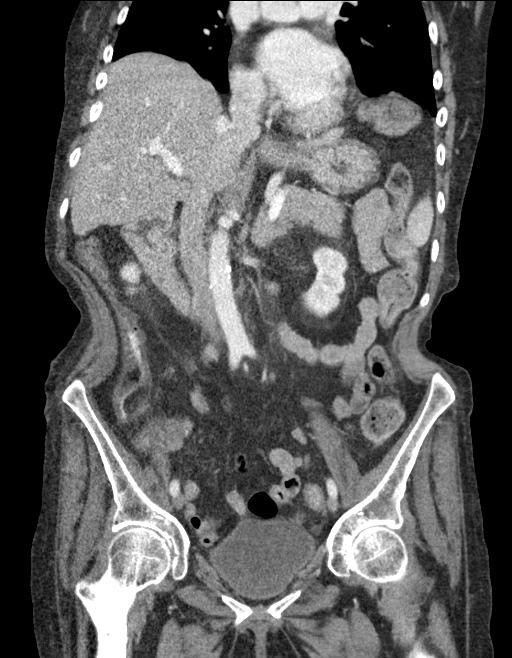
[im 68/123  soft-tissue]
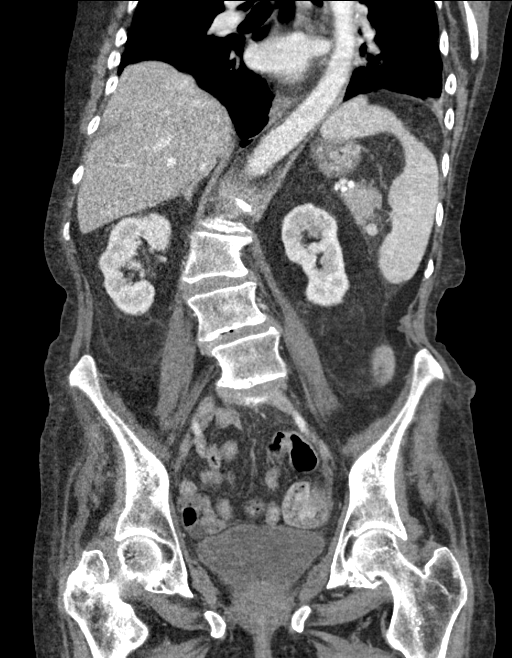

[16 of 46 positions shown; findings below may reference images not displayed]

FINDINGS: Lower chest: There is a small left-sided pleural effusion. The heart
size is normal.

Hepatobiliary: No focal liver abnormality is seen. No gallstones,
gallbladder wall thickening, or biliary dilatation.

Pancreas: Unremarkable. No pancreatic ductal dilatation or
surrounding inflammatory changes.

Spleen: The spleen is borderline enlarged measuring approximately 12
cm craniocaudad.

Adrenals/Urinary Tract: Adrenal glands are unremarkable. Kidneys are
normal, without renal calculi, focal lesion, or hydronephrosis.
Bladder is unremarkable.

Stomach/Bowel: Stomach is within normal limits. Appendix appears
normal. No evidence of bowel wall thickening, distention, or
inflammatory changes. There is some mild wall thickening of the
ascending colon which is favored to be secondary to underdistention
as opposed to colitis.

Vascular/Lymphatic: No significant vascular findings are present. No
enlarged abdominal or pelvic lymph nodes. There is stable mildly
prominent mesenteric lymph nodes in the low midline abdomen

Reproductive: Status post hysterectomy. No adnexal masses.

Other: No abdominal wall hernia or abnormality. No abdominopelvic
ascites.

Musculoskeletal: There appears to be a old intratrochanteric healed
fracture of the right femur. There is no acute displaced fracture.
IMPRESSION: 1. No acute intra-abdominal abnormality detected to explain the
patient's symptoms.
2. Small left-sided pleural effusion.

Aortic Atherosclerosis ([id number]-[id number].K).

## 2020-02-19 IMAGING — DX PORTABLE CHEST - 1 VIEW
1 series · 1 of 1 positions shown · non-contrast
Comparison: 04/08/2019

CLINICAL DATA: Pneumonia

EXAM:
PORTABLE CHEST 1 VIEW

[chest ap]
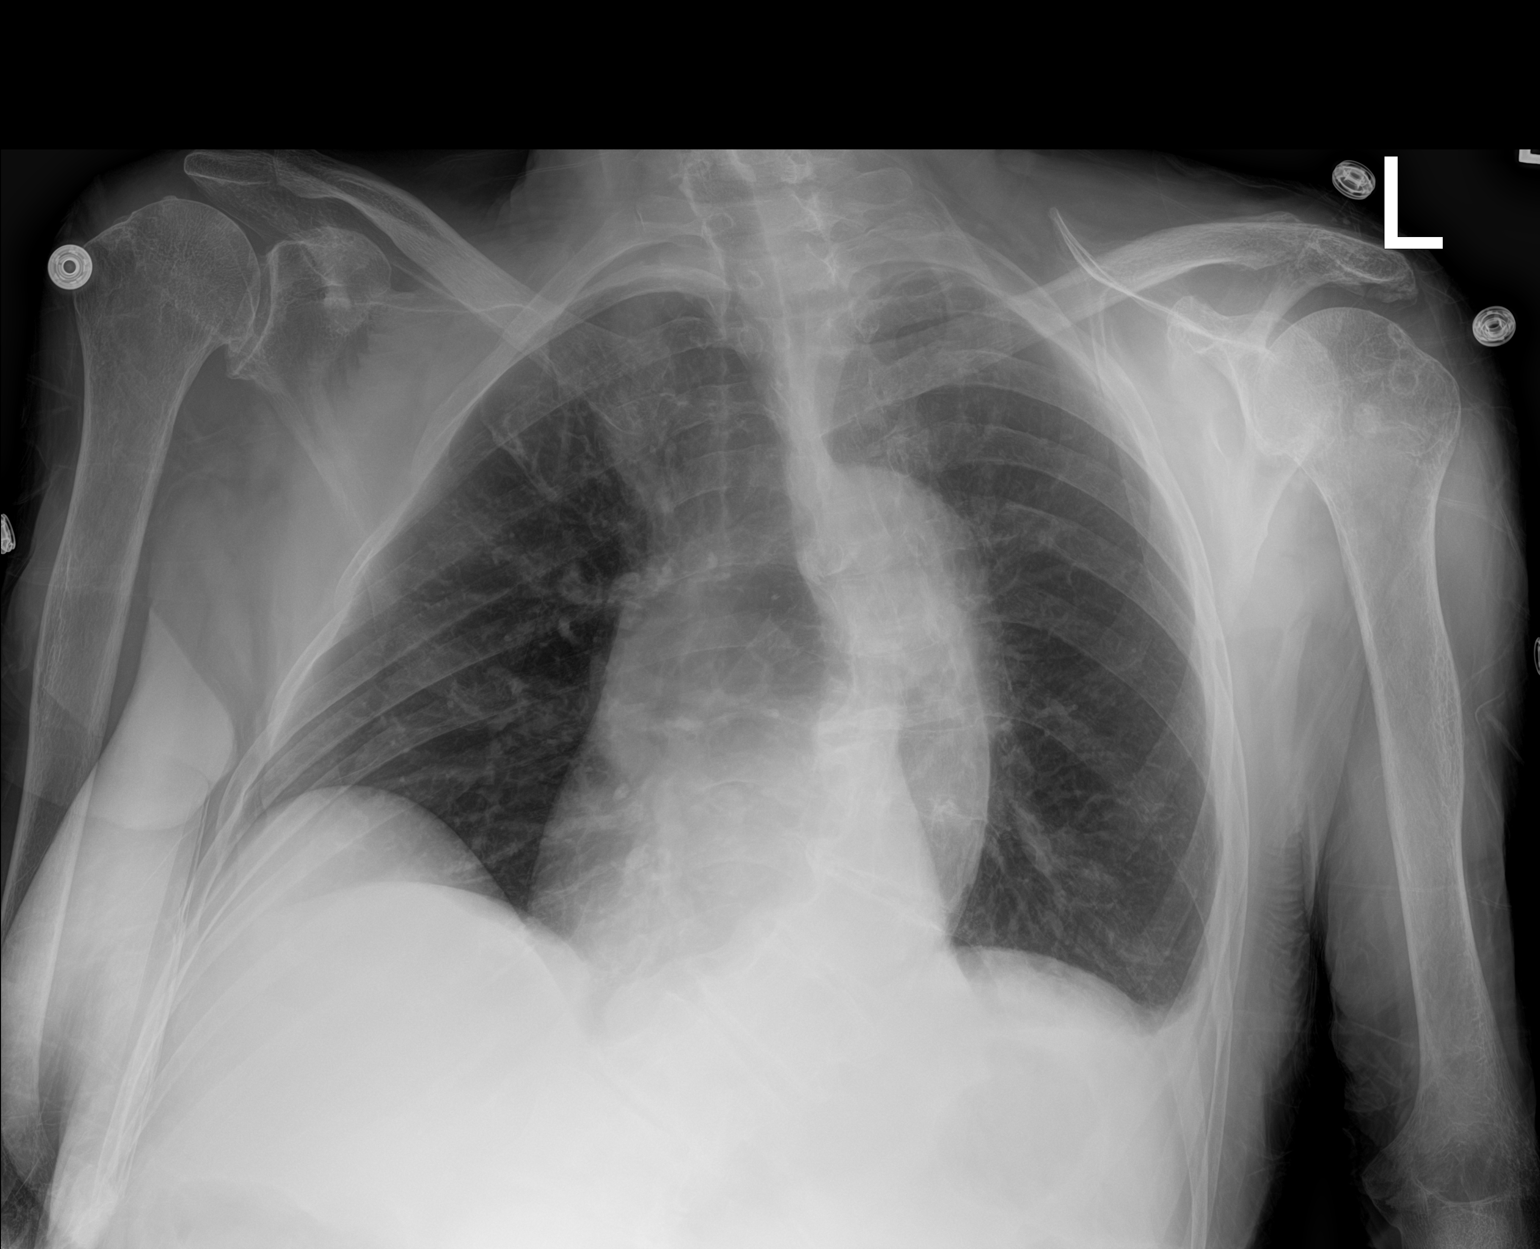

[1 of 1 positions shown; findings below may reference images not displayed]

FINDINGS: Blunting of the left costophrenic angle, compatible with small
fusion. Left base atelectasis or infiltrate. Right lung clear. Heart
is normal size. Severe thoracolumbar scoliosis.
IMPRESSION: Small left pleural effusion. Minimal left base atelectasis or
infiltrate.
